# Patient Record
Sex: Female | Born: 1975 | Race: Black or African American | Hispanic: No | Marital: Married | Smoking: Never smoker
Health system: Southern US, Community
[De-identification: ages and names within clinical notes are randomized; demographics above are authoritative.]

## PROBLEM LIST (undated history)

## (undated) ENCOUNTER — Inpatient Hospital Stay (HOSPITAL_COMMUNITY): Payer: Self-pay

## (undated) DIAGNOSIS — I2699 Other pulmonary embolism without acute cor pulmonale: Secondary | ICD-10-CM

## (undated) DIAGNOSIS — R609 Edema, unspecified: Secondary | ICD-10-CM

## (undated) DIAGNOSIS — R51 Headache: Secondary | ICD-10-CM

## (undated) HISTORY — DX: Morbid (severe) obesity due to excess calories: E66.01

## (undated) HISTORY — PX: BREAST REDUCTION SURGERY: SHX8

## (undated) HISTORY — DX: Headache: R51

## (undated) HISTORY — DX: Edema, unspecified: R60.9

---

## 1998-09-11 ENCOUNTER — Emergency Department (HOSPITAL_COMMUNITY): Admission: EM | Admit: 1998-09-11 | Discharge: 1998-09-11 | Payer: Self-pay | Admitting: Emergency Medicine

## 1998-09-11 ENCOUNTER — Encounter: Payer: Self-pay | Admitting: Emergency Medicine

## 1998-09-11 ENCOUNTER — Inpatient Hospital Stay (HOSPITAL_COMMUNITY): Admission: AD | Admit: 1998-09-11 | Discharge: 1998-09-11 | Payer: Self-pay | Admitting: Obstetrics and Gynecology

## 1998-09-12 ENCOUNTER — Inpatient Hospital Stay (HOSPITAL_COMMUNITY): Admission: AD | Admit: 1998-09-12 | Discharge: 1998-09-12 | Payer: Self-pay | Admitting: Obstetrics and Gynecology

## 1998-09-15 ENCOUNTER — Inpatient Hospital Stay (HOSPITAL_COMMUNITY): Admission: AD | Admit: 1998-09-15 | Discharge: 1998-09-15 | Payer: Self-pay | Admitting: Obstetrics & Gynecology

## 2002-03-21 ENCOUNTER — Emergency Department (HOSPITAL_COMMUNITY): Admission: EM | Admit: 2002-03-21 | Discharge: 2002-03-21 | Payer: Self-pay | Admitting: *Deleted

## 2002-03-21 ENCOUNTER — Encounter: Payer: Self-pay | Admitting: Emergency Medicine

## 2003-01-20 ENCOUNTER — Observation Stay (HOSPITAL_COMMUNITY): Admission: RE | Admit: 2003-01-20 | Discharge: 2003-01-21 | Payer: Self-pay | Admitting: Plastic Surgery

## 2003-01-20 ENCOUNTER — Encounter (INDEPENDENT_AMBULATORY_CARE_PROVIDER_SITE_OTHER): Payer: Self-pay | Admitting: *Deleted

## 2003-03-19 HISTORY — PX: BREAST SURGERY: SHX581

## 2003-03-31 ENCOUNTER — Emergency Department (HOSPITAL_COMMUNITY): Admission: EM | Admit: 2003-03-31 | Discharge: 2003-03-31 | Payer: Self-pay | Admitting: Emergency Medicine

## 2004-10-25 ENCOUNTER — Ambulatory Visit: Payer: Self-pay | Admitting: Internal Medicine

## 2005-10-03 ENCOUNTER — Ambulatory Visit: Payer: Self-pay | Admitting: Internal Medicine

## 2006-03-03 ENCOUNTER — Ambulatory Visit: Payer: Self-pay | Admitting: Internal Medicine

## 2006-11-15 ENCOUNTER — Encounter: Payer: Self-pay | Admitting: Internal Medicine

## 2006-11-15 DIAGNOSIS — G43909 Migraine, unspecified, not intractable, without status migrainosus: Secondary | ICD-10-CM | POA: Insufficient documentation

## 2007-07-15 ENCOUNTER — Emergency Department (HOSPITAL_COMMUNITY): Admission: EM | Admit: 2007-07-15 | Discharge: 2007-07-15 | Payer: Self-pay | Admitting: Emergency Medicine

## 2007-07-21 ENCOUNTER — Ambulatory Visit: Payer: Self-pay | Admitting: Internal Medicine

## 2007-07-21 DIAGNOSIS — N390 Urinary tract infection, site not specified: Secondary | ICD-10-CM | POA: Insufficient documentation

## 2007-07-21 DIAGNOSIS — M545 Low back pain, unspecified: Secondary | ICD-10-CM | POA: Insufficient documentation

## 2007-07-21 DIAGNOSIS — R030 Elevated blood-pressure reading, without diagnosis of hypertension: Secondary | ICD-10-CM | POA: Insufficient documentation

## 2007-08-03 ENCOUNTER — Telehealth: Payer: Self-pay | Admitting: Internal Medicine

## 2007-08-21 ENCOUNTER — Ambulatory Visit: Payer: Self-pay | Admitting: Internal Medicine

## 2007-08-25 ENCOUNTER — Telehealth: Payer: Self-pay | Admitting: Internal Medicine

## 2007-10-14 ENCOUNTER — Ambulatory Visit: Payer: Self-pay | Admitting: Internal Medicine

## 2007-10-14 DIAGNOSIS — R609 Edema, unspecified: Secondary | ICD-10-CM | POA: Insufficient documentation

## 2007-11-13 ENCOUNTER — Ambulatory Visit: Payer: Self-pay | Admitting: Internal Medicine

## 2008-01-20 ENCOUNTER — Ambulatory Visit: Payer: Self-pay | Admitting: Internal Medicine

## 2008-02-16 ENCOUNTER — Telehealth: Payer: Self-pay | Admitting: Internal Medicine

## 2008-10-12 ENCOUNTER — Emergency Department (HOSPITAL_COMMUNITY): Admission: EM | Admit: 2008-10-12 | Discharge: 2008-10-12 | Payer: Self-pay | Admitting: Emergency Medicine

## 2008-11-11 ENCOUNTER — Inpatient Hospital Stay (HOSPITAL_COMMUNITY): Admission: AD | Admit: 2008-11-11 | Discharge: 2008-11-12 | Payer: Self-pay | Admitting: Obstetrics & Gynecology

## 2008-12-22 ENCOUNTER — Inpatient Hospital Stay (HOSPITAL_COMMUNITY): Admission: AD | Admit: 2008-12-22 | Discharge: 2008-12-22 | Payer: Self-pay | Admitting: Obstetrics and Gynecology

## 2008-12-27 ENCOUNTER — Other Ambulatory Visit: Admission: RE | Admit: 2008-12-27 | Discharge: 2008-12-27 | Payer: Self-pay | Admitting: Obstetrics and Gynecology

## 2009-01-06 ENCOUNTER — Inpatient Hospital Stay (HOSPITAL_COMMUNITY): Admission: AD | Admit: 2009-01-06 | Discharge: 2009-01-06 | Payer: Self-pay | Admitting: Obstetrics and Gynecology

## 2009-06-19 ENCOUNTER — Inpatient Hospital Stay (HOSPITAL_COMMUNITY): Admission: AD | Admit: 2009-06-19 | Discharge: 2009-06-19 | Payer: Self-pay | Admitting: Obstetrics and Gynecology

## 2009-09-20 ENCOUNTER — Ambulatory Visit: Payer: Self-pay | Admitting: Nurse Practitioner

## 2009-09-20 ENCOUNTER — Inpatient Hospital Stay (HOSPITAL_COMMUNITY): Admission: AD | Admit: 2009-09-20 | Discharge: 2009-09-20 | Payer: Self-pay | Admitting: Obstetrics and Gynecology

## 2009-10-11 ENCOUNTER — Encounter: Admission: RE | Admit: 2009-10-11 | Discharge: 2009-10-11 | Payer: Self-pay | Admitting: Obstetrics and Gynecology

## 2009-11-22 ENCOUNTER — Inpatient Hospital Stay (HOSPITAL_COMMUNITY)
Admission: RE | Admit: 2009-11-22 | Discharge: 2009-11-24 | Payer: Self-pay | Source: Home / Self Care | Admitting: Obstetrics and Gynecology

## 2010-01-17 ENCOUNTER — Ambulatory Visit: Payer: Self-pay | Admitting: Internal Medicine

## 2010-04-17 NOTE — Progress Notes (Signed)
Summary: RFs Lost  Phone Note Call from Patient Call back at Home Phone [phone]   Summary of Call: Patient is requesting rx's from last office visit, ok to fill? She said she lost rx. Initial call taken by: Lamar Sprinkles,  February 16, 2008 2:05 PM  Follow-up for Phone Call        OK to refill Follow-up by: Tresa Garter MD,  February 16, 2008 4:25 PM  Additional Follow-up for Phone Call Additional follow up Details #1::        Pt informed  Additional Follow-up by: Lamar Sprinkles,  February 17, 2008 3:14 PM      Prescriptions: SPIRONOLACTONE 25 MG TABS (SPIRONOLACTONE) 1 by mouth qd  #30 x 12   Entered by:   Lamar Sprinkles   Authorized by:   Tresa Garter MD   Signed by:   Lamar Sprinkles on 02/17/2008   Method used:   Telephoned to ...       [facility] [phone]* (retail)       [address] [place], [place]  [address]       Ph: [phone]       Fax: [phone]   RxID:   [id number] MIRALAX   POWD (POLYETHYLENE GLYCOL 3350) 17g by mouth once daily as needed constipation  #qs x 12   Entered by:   Lamar Sprinkles   Authorized by:   Tresa Garter MD   Signed by:   Lamar Sprinkles on 02/17/2008   Method used:   Telephoned to ...       [facility] [phone]* (retail)       [address] [place], [place]  [address]       Ph: [phone]       Fax: [phone]   RxID:   [id number] PHENTERMINE HCL 37.5 MG  TABS (PHENTERMINE HCL) 1 by mouth qam  #30 x 2   Entered by:   Lamar Sprinkles   Authorized by:   Tresa Garter MD   Signed by:   Lamar Sprinkles on 02/17/2008   Method used:   Telephoned to ...       [facility] [phone]* (retail)       [address] [place], [place]  [address]       Ph: [phone]       Fax: [phone]   RxID:   [id number]

## 2010-04-17 NOTE — Progress Notes (Signed)
Summary: OTC MEDS  Phone Note Call from Patient Call back at Home Phone [phone]   Summary of Call: Pt says that meds given at last office visit makes her sleepy. This is interferring with her work, is there an otc med or another rx that she can try.  Initial call taken by: Lamar Sprinkles,  Aug 03, 2007 8:31 AM  Follow-up for Phone Call        Cont Meloxicam as needed Try ES Tylenol 2 by mouth qid as needed, d/c Darvocet Follow-up by: Tresa Garter MD,  Aug 03, 2007 1:18 PM  Additional Follow-up for Phone Call Additional follow up Details #1::        lf mess for pt Additional Follow-up by: Lamar Sprinkles,  Aug 03, 2007 1:52 PM

## 2010-04-17 NOTE — Assessment & Plan Note (Signed)
Summary: f/u appt / $50 / cd   Vital Signs:  Patient Profile:   35 Years Old Female Weight:      315 pounds Temp:     98.6 degrees F oral Pulse rate:   84 / minute BP sitting:   142 / 90  (left arm)  Vitals Entered By: Tora Perches (January 20, 2008 4:42 PM)                 Chief Complaint:  Multiple medical problems or concerns.  History of Present Illness: The patient presents for a follow up of obesity, edema,      Current Allergies: No known allergies   Past Medical History:    Reviewed history from 07/21/2007 and no changes required:       Headache       Morbid obesity       Constipation   Family History:    Reviewed history from 07/21/2007 and no changes required:       Family History Hypertension  Social History:    Reviewed history from 07/21/2007 and no changes required:       Occupation:works for [employer]       Single       Never Smoked     Physical Exam  General:     overweight-appearing.   Head:     Normocephalic and atraumatic without obvious abnormalities. No apparent alopecia or balding. Nose:     External nasal examination shows no deformity or inflammation. Nasal mucosa are pink and moist without lesions or exudates. Mouth:     Oral mucosa and oropharynx without lesions or exudates.  Teeth in good repair. Neck:     No deformities, masses, or tenderness noted. Lungs:     Normal respiratory effort, chest expands symmetrically. Lungs are clear to auscultation, no crackles or wheezes. Heart:     Normal rate and regular rhythm. S1 and S2 normal without gallop, murmur, click, rub or other extra sounds. Abdomen:     Bowel sounds positive,abdomen soft and non-tender without masses, organomegaly or hernias noted. Msk:     No deformity or scoliosis noted of thoracic or lumbar spine.  Calves NT B Extremities:     WNL Neurologic:     No cranial nerve deficits noted. Station and gait are normal. Plantar reflexes are down-going bilaterally.  DTRs are symmetrical throughout. Sensory, motor and coordinative functions appear intact. Skin:     Intact without suspicious lesions or rashes Psych:     Oriented X3.      Impression & Recommendations:  Problem # 1:  OBESITY, MORBID (ICD-278.01) Assessment: Unchanged Another trial of Phentermine Risks vs benefits and controversies of a long term wt loss drug use were discussed.   Problem # 2:  EDEMA (ICD-782.3) Assessment: Improved  Her updated medication list for this problem includes:    Spironolactone 25 Mg Tabs (Spironolactone) .Marland Kitchen... 1 by mouth qd   Problem # 3:  ELEVATED BP (ICD-796.2) Assessment: Comment Only  Her updated medication list for this problem includes:    Spironolactone 25 Mg Tabs (Spironolactone) .Marland Kitchen... 1 by mouth qd   Complete Medication List: 1)  Phentermine Hcl 37.5 Mg Tabs (Phentermine hcl) .Marland Kitchen.. 1 by mouth qam 2)  Miralax Powd (Polyethylene glycol 3350) .Marland Kitchen.. 17g by mouth once daily as needed constipation 3)  Spironolactone 25 Mg Tabs (Spironolactone) .Marland Kitchen.. 1 by mouth qd 4)  Vitamin D3 1000 Unit Tabs (Cholecalciferol) .Marland Kitchen.. 1 by mouth daily   Patient Instructions:  1)  Please schedule a follow-up appointment in 3 months. 2)  BMP prior to visit, ICD-9: 3)  Hepatic Panel prior to visit, ICD-9: 4)  TSH prior to visit, ICD-9: 278.00 5)  CBC w/ Diff prior to visit, ICD-9:   Prescriptions: SPIRONOLACTONE 25 MG TABS (SPIRONOLACTONE) 1 by mouth qd  #30 x 12   Entered and Authorized by:   Tresa Garter MD   Signed by:   Tresa Garter MD on 01/20/2008   Method used:   Print then Give to Patient   RxID:   [id number] MIRALAX   POWD (POLYETHYLENE GLYCOL 3350) 17g by mouth once daily as needed constipation  #qs x 12   Entered and Authorized by:   Tresa Garter MD   Signed by:   Tresa Garter MD on 01/20/2008   Method used:   Print then Give to Patient   RxID:   [id number] PHENTERMINE HCL 37.5 MG  TABS (PHENTERMINE HCL) 1  by mouth qam  #30 x 2   Entered and Authorized by:   Tresa Garter MD   Signed by:   Tresa Garter MD on 01/20/2008   Method used:   Print then Give to Patient   RxID:   [id number]  ]  Influenza Vaccine (to be given today)

## 2010-04-17 NOTE — Letter (Signed)
Summary: Out of Work  [facility]  [address] [place], [place] [address]   Phone: [phone]  Fax: [phone]    October 14, 2007   Employee:  ADONICA FUKUSHIMA    To Whom It May Concern:   For Medical reasons, please excuse the above named employee from work for the following dates:  Start:  10-14-2007      End:   10-14-2007  If you need additional information, please feel free to contact our office.         Sincerely,    Tora Perches

## 2010-04-17 NOTE — Assessment & Plan Note (Signed)
Summary: SWELLING IN LEG/NWS   Vital Signs:  Patient profile:   35 year old female Weight:      328 pounds Temp:     98.3 degrees F oral Pulse rate:   72 / minute Pulse rhythm:   regular Resp:     16 per minute BP sitting:   130 / 98  (left arm) Cuff size:   regular  Vitals Entered By: Lanier Prude, CMA(AAMA) (January 17, 2010 4:53 PM) CC: fell in July. c/o left leg/foot pain & swelling Is Patient Diabetic? No Comments pt also wants to discuss weight loss   CC:  fell in July. c/o left leg/foot pain & swelling.  History of Present Illness: C/o swelling in L foot. She had a baby in Sept 2011. She fell off the steps in Jul 2011 and twisted her L foot/ankle. She had a Doppler US in Jul - no DVT. There was no xrays done. The foot is NT now. C/o wt gain. Not breast-feeding now  Current Medications (verified): 1)  Phentermine Hcl 37.5 Mg  Tabs (Phentermine Hcl) .Marland Kitchen.. 1 By Mouth Qam 2)  Miralax   Powd (Polyethylene Glycol 3350) .Marland Kitchen.. 17g By Mouth Once Daily As Needed Constipation 3)  Spironolactone 25 Mg Tabs (Spironolactone) .Marland Kitchen.. 1 By Mouth Qd 4)  Vitamin D3 1000 Unit  Tabs (Cholecalciferol) .Marland Kitchen.. 1 By Mouth Daily  Allergies (verified): No Known Drug Allergies  Past History:  Past Medical History: Last updated: 01/20/2008 Headache Morbid obesity Constipation  Family History: Last updated: 07/21/2007 Family History Hypertension  Past Surgical History: Denies surgical history  Social History: Occupation:works for [employer] Single 1 child 11/2009 Never Smoked  Review of Systems       The patient complains of weight gain and difficulty walking.  The patient denies anorexia, fever, weight loss, vision loss, decreased hearing, hoarseness, chest pain, syncope, dyspnea on exertion, peripheral edema, prolonged cough, headaches, hemoptysis, abdominal pain, melena, hematochezia, severe indigestion/heartburn, hematuria, incontinence, genital sores, muscle weakness, suspicious skin  lesions, transient blindness, depression, unusual weight change, abnormal bleeding, enlarged lymph nodes, angioedema, and breast masses.    Physical Exam  General:  Very overweight-appearing.   Head:  Normocephalic and atraumatic without obvious abnormalities. No apparent alopecia or balding. Eyes:  No corneal or conjunctival inflammation noted. EOMI. Perrla. Funduscopic exam benign, without hemorrhages, exudates or papilledema. Vision grossly normal. Ears:  External ear exam shows no significant lesions or deformities.  Otoscopic examination reveals clear canals, tympanic membranes are intact bilaterally without bulging, retraction, inflammation or discharge. Hearing is grossly normal bilaterally. Nose:  External nasal examination shows no deformity or inflammation. Nasal mucosa are pink and moist without lesions or exudates. Mouth:  Oral mucosa and oropharynx without lesions or exudates.  Teeth in good repair. Neck:  No deformities, masses, or tenderness noted. Lungs:  Normal respiratory effort, chest expands symmetrically. Lungs are clear to auscultation, no crackles or wheezes. Heart:  Normal rate and regular rhythm. S1 and S2 normal without gallop, murmur, click, rub or other extra sounds. Abdomen:  Bowel sounds positive,abdomen soft and non-tender without masses, organomegaly or hernias noted. Msk:  No deformity or scoliosis noted of thoracic or lumbar spine.  Calves NT B. L foot with a very large dorsal soft NT fluid collection subcutaneously spreading up to distal lat ankle. Portable US c/w large free fluid collection, no blood flow Pulses:  decreased Extremities:  trace left pedal edema and trace right pedal edema.   Neurologic:  alert & oriented X3, gait  normal, and DTRs symmetrical and normal.   Skin:  no rash Psych:  Oriented X3.     Impression & Recommendations:  Problem # 1:  EDEMA (ICD-782.3) L foot - localized. Assessment New See "Patient Instructions". May need an MRI.  Portable US was done The following medications were removed from the medication list:    Spironolactone 25 Mg Tabs (Spironolactone) .Marland Kitchen... 1 by mouth qd Her updated medication list for this problem includes:    Maxzide-25 37.5-25 Mg Tabs (Triamterene-hctz) .Marland Kitchen... 1 by mouth qam  Orders: T-Foot Left Min 3 Views (73630TC) T-Ankle Comp Left Min 3 Views (73610TC) TLB-TSH (Thyroid Stimulating Hormone) (84443-TSH) T-Vitamin D (25-Hydroxy) [phone]) TLB-B12, Serum-Total ONLY (09811-B14) TLB-BMP (Basic Metabolic Panel-BMET) (80048-METABOL) TLB-CBC Platelet - w/Differential (85025-CBCD) TLB-Hepatic/Liver Function Pnl (80076-HEPATIC)  Problem # 2:  OBESITY, MORBID (ICD-278.01) Assessment: Deteriorated Will restart Phentermine Risks vs benefits and controversies of a long term Phentermine use were discussed.  Orders: TLB-TSH (Thyroid Stimulating Hormone) (84443-TSH) T-Vitamin D (25-Hydroxy) [phone]) TLB-B12, Serum-Total ONLY (86578-I69) TLB-BMP (Basic Metabolic Panel-BMET) (80048-METABOL) TLB-CBC Platelet - w/Differential (85025-CBCD) TLB-Hepatic/Liver Function Pnl (80076-HEPATIC)  Problem # 3:  ELEVATED BP (ICD-796.2) Assessment: New  The following medications were removed from the medication list:    Spironolactone 25 Mg Tabs (Spironolactone) .Marland Kitchen... 1 by mouth qd Her updated medication list for this problem includes:    Maxzide-25 37.5-25 Mg Tabs (Triamterene-hctz) .Marland Kitchen... 1 by mouth qam  Complete Medication List: 1)  Miralax Powd (Polyethylene glycol 3350) .Marland Kitchen.. 17g by mouth once daily as needed constipation 2)  Vitamin D3 1000 Unit Tabs (Cholecalciferol) .Marland Kitchen.. 1 by mouth daily 3)  Maxzide-25 37.5-25 Mg Tabs (Triamterene-hctz) .Marland Kitchen.. 1 by mouth qam 4)  Phentermine Hcl 37.5 Mg Tabs (Phentermine hcl) .Marland Kitchen.. 1 by mouth qam 5)  Ibuprofen 600 Mg Tabs (Ibuprofen) .Marland Kitchen.. 1 by mouth bid  pc x 1 wk then as needed for  pain 6)  Klor-con M10 10 Meq Cr-tabs (Potassium chloride crys cr) .Marland Kitchen.. 1 by  mouth qd  Patient Instructions: 1)  Please schedule a follow-up appointment in 1 month. 2)  Elevate foot 3)  Use ACE wrap Prescriptions: MAXZIDE-25 37.5-25 MG TABS (TRIAMTERENE-HCTZ) 1 by mouth qam  #30 x 6   Entered and Authorized by:   Tresa Garter MD   Signed by:   Tresa Garter MD on 01/17/2010   Method used:   Print then Give to Patient   RxID:   [id number] KLOR-CON M10 10 MEQ CR-TABS (POTASSIUM CHLORIDE CRYS CR) 1 by mouth qd  #90 x 3   Entered and Authorized by:   Tresa Garter MD   Signed by:   Tresa Garter MD on 01/17/2010   Method used:   Print then Give to Patient   RxID:   [id number] IBUPROFEN 600 MG TABS (IBUPROFEN) 1 by mouth bid  pc x 1 wk then as needed for  pain  #60 x 3   Entered and Authorized by:   Tresa Garter MD   Signed by:   Tresa Garter MD on 01/17/2010   Method used:   Print then Give to Patient   RxID:   [id number] PHENTERMINE HCL 37.5 MG TABS (PHENTERMINE HCL) 1 by mouth qam  #30 x 2   Entered and Authorized by:   Tresa Garter MD   Signed by:   Tresa Garter MD on 01/17/2010   Method used:   Print then Give to Patient   RxID:   [id number]  Orders Added: 1)  T-Foot Left Min 3 Views [73630TC] 2)  T-Ankle Comp Left Min 3 Views [73610TC] 3)  TLB-TSH (Thyroid Stimulating Hormone) [84443-TSH] 4)  T-Vitamin D (25-Hydroxy) [16109-60454] 5)  TLB-B12, Serum-Total ONLY [82607-B12] 6)  TLB-BMP (Basic Metabolic Panel-BMET) [80048-METABOL] 7)  TLB-CBC Platelet - w/Differential [85025-CBCD] 8)  TLB-Hepatic/Liver Function Pnl [80076-HEPATIC] 9)  Est. Patient Level V [09811]

## 2010-04-17 NOTE — Assessment & Plan Note (Signed)
Summary: 1 MTH FU-$50-STC   Vital Signs:  Patient Profile:   35 Years Old Female Weight:      331 pounds Temp:     97.4 degrees F oral Pulse rate:   80 / minute BP sitting:   149 / 98  (left arm)  Vitals Entered By: Tora Perches (August 21, 2007 4:35 PM)                 Chief Complaint:  Multiple medical problems or concerns.  History of Present Illness: The patient presents for a follow up ofL BP 25% better     Current Allergies: No known allergies   Past Medical History:    Reviewed history from 07/21/2007 and no changes required:       Headache       Morbid obesity   Family History:    Reviewed history from 07/21/2007 and no changes required:       Family History Hypertension  Social History:    Reviewed history from 07/21/2007 and no changes required:       Occupation:works for [employer]       Single       Never Smoked     Physical Exam  General:     overweight-appearing.   Eyes:     No corneal or conjunctival inflammation noted. EOMI. Perrla. Funduscopic exam benign, without hemorrhages, exudates or papilledema. Vision grossly normal. Ears:     External ear exam shows no significant lesions or deformities.  Otoscopic examination reveals clear canals, tympanic membranes are intact bilaterally without bulging, retraction, inflammation or discharge. Hearing is grossly normal bilaterally. Nose:     External nasal examination shows no deformity or inflammation. Nasal mucosa are pink and moist without lesions or exudates. Mouth:     Oral mucosa and oropharynx without lesions or exudates.  Teeth in good repair. Neck:     No deformities, masses, or tenderness noted. Lungs:     Normal respiratory effort, chest expands symmetrically. Lungs are clear to auscultation, no crackles or wheezes. Heart:     Normal rate and regular rhythm. S1 and S2 normal without gallop, murmur, click, rub or other extra sounds. Abdomen:     Bowel sounds positive,abdomen soft and  non-tender without masses, organomegaly or hernias noted. Msk:     No deformity or scoliosis noted of thoracic or lumbar spine.   Neurologic:     No cranial nerve deficits noted. Station and gait are normal. Plantar reflexes are down-going bilaterally. DTRs are symmetrical throughout. Sensory, motor and coordinative functions appear intact. Skin:     Intact without suspicious lesions or rashes Psych:     Cognition and judgment appear intact. Alert and cooperative with normal attention span and concentration. No apparent delusions, illusions, hallucinations    Impression & Recommendations:  Problem # 1:  ELEVATED BP (ICD-796.2) Resolved  Problem # 2:  OBESITY, MORBID (ICD-278.01) Trial of Phentermine. Risks vs benefits and controversies of a long term wt loss drugs use were discussed.   Complete Medication List: 1)  Meloxicam 15 Mg Tabs (Meloxicam) .... One by mouth daily 1 wk, then prn 2)  Vitamin D3 1000 Unit Tabs (Cholecalciferol) .Marland Kitchen.. 1 by mouth daily 3)  Phentermine Hcl 37.5 Mg Tabs (Phentermine hcl) .Marland Kitchen.. 1 by mouth qam 4)  Miralax Powd (Polyethylene glycol 3350) .Marland Kitchen.. 17g by mouth once daily as needed constipation   Patient Instructions: 1)  Please schedule a follow-up appointment in 3 months.   Prescriptions: MIRALAX  POWD (POLYETHYLENE GLYCOL 3350) 17g by mouth once daily as needed constipation  #qs x 3   Entered and Authorized by:   Tresa Garter MD   Signed by:   Tresa Garter MD on 08/21/2007   Method used:   Print then Give to Patient   RxID:   [id number] PHENTERMINE HCL 37.5 MG  TABS (PHENTERMINE HCL) 1 by mouth qam  #30 x 3   Entered and Authorized by:   Tresa Garter MD   Signed by:   Tresa Garter MD on 08/21/2007   Method used:   Print then Give to Patient   RxID:   [id number]  ]

## 2010-05-03 ENCOUNTER — Other Ambulatory Visit (HOSPITAL_COMMUNITY)
Admission: RE | Admit: 2010-05-03 | Discharge: 2010-05-03 | Disposition: A | Discharge status: Home / Self Care | Payer: 59 | Source: Ambulatory Visit | Attending: Obstetrics and Gynecology | Admitting: Obstetrics and Gynecology

## 2010-05-03 ENCOUNTER — Other Ambulatory Visit: Payer: Self-pay | Admitting: Obstetrics and Gynecology

## 2010-05-03 DIAGNOSIS — Z01419 Encounter for gynecological examination (general) (routine) without abnormal findings: Secondary | ICD-10-CM | POA: Insufficient documentation

## 2010-05-03 DIAGNOSIS — Z113 Encounter for screening for infections with a predominantly sexual mode of transmission: Secondary | ICD-10-CM | POA: Insufficient documentation

## 2010-05-28 ENCOUNTER — Inpatient Hospital Stay (HOSPITAL_COMMUNITY): Payer: 59

## 2010-05-28 ENCOUNTER — Inpatient Hospital Stay (HOSPITAL_COMMUNITY)
Admission: AD | Admit: 2010-05-28 | Discharge: 2010-05-28 | Disposition: A | Discharge status: Home / Self Care | Payer: 59 | Source: Ambulatory Visit | Attending: Obstetrics | Admitting: Obstetrics

## 2010-05-28 DIAGNOSIS — N949 Unspecified condition associated with female genital organs and menstrual cycle: Secondary | ICD-10-CM | POA: Insufficient documentation

## 2010-05-28 DIAGNOSIS — N938 Other specified abnormal uterine and vaginal bleeding: Secondary | ICD-10-CM | POA: Insufficient documentation

## 2010-05-31 LAB — COMPREHENSIVE METABOLIC PANEL
ALT: 27 U/L (ref 0–35)
AST: 36 U/L (ref 0–37)
Albumin: 3 g/dL — ABNORMAL LOW (ref 3.5–5.2)
Alkaline Phosphatase: 84 U/L (ref 39–117)
BUN: 7 mg/dL (ref 6–23)
CO2: 21 mEq/L (ref 19–32)
Calcium: 9.2 mg/dL (ref 8.4–10.5)
Chloride: 109 mEq/L (ref 96–112)
Creatinine, Ser: 0.55 mg/dL (ref 0.4–1.2)
GFR calc Af Amer: >60 mL/min (ref >60)
GFR calc non Af Amer: >60 mL/min (ref >60)
Glucose, Bld: 84 mg/dL (ref 70–99)
Potassium: 4.2 mEq/L (ref 3.5–5.1)
Sodium: 138 mEq/L (ref 135–145)
Total Bilirubin: 1.1 mg/dL (ref 0.3–1.2)
Total Protein: 5.9 g/dL — ABNORMAL LOW (ref 6.0–8.3)

## 2010-05-31 LAB — RPR: RPR Ser Ql: NONREACTIVE

## 2010-05-31 LAB — CBC
HCT: 30.5 % — ABNORMAL LOW (ref 36.0–46.0)
HCT: 39.8 % (ref 36.0–46.0)
Hemoglobin: 10.8 g/dL — ABNORMAL LOW (ref 12.0–15.0)
Hemoglobin: 13.6 g/dL (ref 12.0–15.0)
MCH: 33.9 pg (ref 26.0–34.0)
MCH: 35.3 pg — ABNORMAL HIGH (ref 26.0–34.0)
MCHC: 34.1 g/dL (ref 30.0–36.0)
MCHC: 35.4 g/dL (ref 30.0–36.0)
MCV: 99.3 fL (ref 78.0–100.0)
MCV: 99.5 fL (ref 78.0–100.0)
Platelets: 207 10*3/uL (ref 150–400)
Platelets: 279 10*3/uL (ref 150–400)
RBC: 3.06 MIL/uL — ABNORMAL LOW (ref 3.87–5.11)
RBC: 4.01 MIL/uL (ref 3.87–5.11)
RDW: 13.4 % (ref 11.5–15.5)
RDW: 13.6 % (ref 11.5–15.5)
WBC: 20.4 10*3/uL — ABNORMAL HIGH (ref 4.0–10.5)
WBC: 8.5 10*3/uL (ref 4.0–10.5)

## 2010-05-31 LAB — URIC ACID: Uric Acid, Serum: 4.4 mg/dL (ref 2.4–7.0)

## 2010-05-31 LAB — LACTATE DEHYDROGENASE: LDH: 161 U/L (ref 94–250)

## 2010-06-03 LAB — URINALYSIS, ROUTINE W REFLEX MICROSCOPIC
Bilirubin Urine: NEGATIVE
Glucose, UA: NEGATIVE mg/dL
Ketones, ur: NEGATIVE mg/dL
Leukocytes, UA: NEGATIVE
Nitrite: NEGATIVE
Protein, ur: NEGATIVE mg/dL
Specific Gravity, Urine: >1.03 — ABNORMAL HIGH (ref 1.005–1.030)
Urobilinogen, UA: 1 mg/dL (ref 0.0–1.0)
pH: 6 (ref 5.0–8.0)

## 2010-06-03 LAB — CBC
HCT: 34.1 % — ABNORMAL LOW (ref 36.0–46.0)
Hemoglobin: 11.7 g/dL — ABNORMAL LOW (ref 12.0–15.0)
MCH: 33.7 pg (ref 26.0–34.0)
MCHC: 34.2 g/dL (ref 30.0–36.0)
MCV: 98.3 fL (ref 78.0–100.0)
Platelets: 244 10*3/uL (ref 150–400)
RBC: 3.47 MIL/uL — ABNORMAL LOW (ref 3.87–5.11)
RDW: 12.8 % (ref 11.5–15.5)
WBC: 9.7 10*3/uL (ref 4.0–10.5)

## 2010-06-03 LAB — URINE CULTURE

## 2010-06-03 LAB — COMPREHENSIVE METABOLIC PANEL
ALT: 24 U/L (ref 0–35)
AST: 27 U/L (ref 0–37)
Albumin: 2.9 g/dL — ABNORMAL LOW (ref 3.5–5.2)
Alkaline Phosphatase: 40 U/L (ref 39–117)
BUN: 6 mg/dL (ref 6–23)
CO2: 22 mEq/L (ref 19–32)
Calcium: 9.1 mg/dL (ref 8.4–10.5)
Chloride: 102 mEq/L (ref 96–112)
Creatinine, Ser: 0.57 mg/dL (ref 0.4–1.2)
GFR calc Af Amer: >60 mL/min (ref >60)
GFR calc non Af Amer: >60 mL/min (ref >60)
Glucose, Bld: 90 mg/dL (ref 70–99)
Potassium: 3.5 mEq/L (ref 3.5–5.1)
Sodium: 130 mEq/L — ABNORMAL LOW (ref 135–145)
Total Bilirubin: 0.8 mg/dL (ref 0.3–1.2)
Total Protein: 6.1 g/dL (ref 6.0–8.3)

## 2010-06-03 LAB — DIFFERENTIAL
Basophils Absolute: 0 10*3/uL (ref 0.0–0.1)
Basophils Relative: 0 % (ref 0–1)
Eosinophils Absolute: 0.1 10*3/uL (ref 0.0–0.7)
Eosinophils Relative: 1 % (ref 0–5)
Lymphocytes Relative: 24 % (ref 12–46)
Lymphs Abs: 2.3 10*3/uL (ref 0.7–4.0)
Monocytes Absolute: 0.6 10*3/uL (ref 0.1–1.0)
Monocytes Relative: 7 % (ref 3–12)
Neutro Abs: 6.6 10*3/uL (ref 1.7–7.7)
Neutrophils Relative %: 68 % (ref 43–77)

## 2010-06-03 LAB — URINE MICROSCOPIC-ADD ON

## 2010-06-06 LAB — URINALYSIS, ROUTINE W REFLEX MICROSCOPIC
Glucose, UA: NEGATIVE mg/dL
Ketones, ur: NEGATIVE mg/dL
Leukocytes, UA: NEGATIVE
Nitrite: NEGATIVE
Protein, ur: 30 mg/dL — AB
Specific Gravity, Urine: >1.03 — ABNORMAL HIGH (ref 1.005–1.030)
Urobilinogen, UA: 1 mg/dL (ref 0.0–1.0)
pH: 6.5 (ref 5.0–8.0)

## 2010-06-06 LAB — URINE MICROSCOPIC-ADD ON

## 2010-06-21 LAB — CBC
HCT: 38.8 % (ref 36.0–46.0)
Hemoglobin: 13 g/dL (ref 12.0–15.0)
MCHC: 33.5 g/dL (ref 30.0–36.0)
MCV: 98.4 fL (ref 78.0–100.0)
Platelets: 282 10*3/uL (ref 150–400)
RBC: 3.94 MIL/uL (ref 3.87–5.11)
RDW: 13.1 % (ref 11.5–15.5)
WBC: 9.5 10*3/uL (ref 4.0–10.5)

## 2010-06-21 LAB — WET PREP, GENITAL
Clue Cells Wet Prep HPF POC: NONE SEEN
Yeast Wet Prep HPF POC: NONE SEEN

## 2010-06-21 LAB — CROSSMATCH
ABO/RH(D): A POS
Antibody Screen: NEGATIVE

## 2010-06-21 LAB — URINALYSIS, ROUTINE W REFLEX MICROSCOPIC
Bilirubin Urine: NEGATIVE
Glucose, UA: NEGATIVE mg/dL
Hgb urine dipstick: NEGATIVE
Ketones, ur: NEGATIVE mg/dL
Nitrite: NEGATIVE
Protein, ur: NEGATIVE mg/dL
Specific Gravity, Urine: 1.025 (ref 1.005–1.030)
Urobilinogen, UA: 0.2 mg/dL (ref 0.0–1.0)
pH: 6 (ref 5.0–8.0)

## 2010-06-21 LAB — ABO/RH: ABO/RH(D): A POS

## 2010-06-21 LAB — HCG, QUANTITATIVE, PREGNANCY: hCG, Beta Chain, Quant, S: 3400 m[IU]/mL — ABNORMAL HIGH (ref <5)

## 2010-06-21 LAB — GC/CHLAMYDIA PROBE AMP, GENITAL
Chlamydia, DNA Probe: NEGATIVE
GC Probe Amp, Genital: NEGATIVE

## 2010-06-23 LAB — GC/CHLAMYDIA PROBE AMP, GENITAL
Chlamydia, DNA Probe: NEGATIVE
GC Probe Amp, Genital: NEGATIVE

## 2010-06-23 LAB — CBC
HCT: 37.4 % (ref 36.0–46.0)
Hemoglobin: 12.5 g/dL (ref 12.0–15.0)
MCHC: 33.5 g/dL (ref 30.0–36.0)
MCV: 99.4 fL (ref 78.0–100.0)
Platelets: 283 10*3/uL (ref 150–400)
RBC: 3.76 MIL/uL — ABNORMAL LOW (ref 3.87–5.11)
RDW: 12.9 % (ref 11.5–15.5)
WBC: 10.3 10*3/uL (ref 4.0–10.5)

## 2010-06-23 LAB — HCG, SERUM, QUALITATIVE: Preg, Serum: POSITIVE — AB

## 2010-06-23 LAB — URINE MICROSCOPIC-ADD ON

## 2010-06-23 LAB — WET PREP, GENITAL
Clue Cells Wet Prep HPF POC: NONE SEEN
Trich, Wet Prep: NONE SEEN
Yeast Wet Prep HPF POC: NONE SEEN

## 2010-06-23 LAB — URINALYSIS, ROUTINE W REFLEX MICROSCOPIC
Bilirubin Urine: NEGATIVE
Glucose, UA: NEGATIVE mg/dL
Ketones, ur: 15 mg/dL — AB
Leukocytes, UA: NEGATIVE
Nitrite: NEGATIVE
Protein, ur: NEGATIVE mg/dL
Specific Gravity, Urine: >1.03 — ABNORMAL HIGH (ref 1.005–1.030)
Urobilinogen, UA: 0.2 mg/dL (ref 0.0–1.0)
pH: 6 (ref 5.0–8.0)

## 2010-06-23 LAB — POCT PREGNANCY, URINE: Preg Test, Ur: POSITIVE

## 2010-06-23 LAB — HCG, QUANTITATIVE, PREGNANCY: hCG, Beta Chain, Quant, S: 9205 m[IU]/mL — ABNORMAL HIGH (ref <5)

## 2010-06-24 LAB — DIFFERENTIAL
Basophils Absolute: 0 10*3/uL (ref 0.0–0.1)
Basophils Relative: 1 % (ref 0–1)
Eosinophils Absolute: 0.1 10*3/uL (ref 0.0–0.7)
Eosinophils Relative: 1 % (ref 0–5)
Lymphocytes Relative: 24 % (ref 12–46)
Lymphs Abs: 1.7 10*3/uL (ref 0.7–4.0)
Monocytes Absolute: 0.4 10*3/uL (ref 0.1–1.0)
Monocytes Relative: 6 % (ref 3–12)
Neutro Abs: 4.7 10*3/uL (ref 1.7–7.7)
Neutrophils Relative %: 68 % (ref 43–77)

## 2010-06-24 LAB — BASIC METABOLIC PANEL
BUN: 10 mg/dL (ref 6–23)
CO2: 28 mEq/L (ref 19–32)
Calcium: 9.1 mg/dL (ref 8.4–10.5)
Chloride: 108 mEq/L (ref 96–112)
Creatinine, Ser: 0.58 mg/dL (ref 0.4–1.2)
GFR calc Af Amer: >60 mL/min (ref >60)
GFR calc non Af Amer: >60 mL/min (ref >60)
Glucose, Bld: 120 mg/dL — ABNORMAL HIGH (ref 70–99)
Potassium: 3.5 mEq/L (ref 3.5–5.1)
Sodium: 140 mEq/L (ref 135–145)

## 2010-06-24 LAB — CBC
HCT: 38.6 % (ref 36.0–46.0)
Hemoglobin: 13.1 g/dL (ref 12.0–15.0)
MCHC: 33.9 g/dL (ref 30.0–36.0)
MCV: 96.1 fL (ref 78.0–100.0)
Platelets: 332 10*3/uL (ref 150–400)
RBC: 4.02 MIL/uL (ref 3.87–5.11)
RDW: 12.7 % (ref 11.5–15.5)
WBC: 6.9 10*3/uL (ref 4.0–10.5)

## 2010-06-24 LAB — POCT CARDIAC MARKERS
CKMB, poc: <1 ng/mL — ABNORMAL LOW (ref 1.0–8.0)
Myoglobin, poc: 59.6 ng/mL (ref 12–200)
Troponin i, poc: <0.05 ng/mL (ref 0.00–0.09)

## 2010-08-03 NOTE — Discharge Summary (Signed)
NAME:  Bridget [name], Bridget [name]                        ACCOUNT NO.:  [id number]   MEDICAL RECORD NO.:  [id number]                   PATIENT TYPE:  OBV   LOCATION:  0480                                 FACILITY:  [facility]   PHYSICIAN:  Brantley Persons, M.D.             DATE OF BIRTH:  11-09-1975   DATE OF ADMISSION:  01/20/2003  DATE OF DISCHARGE:  01/21/2003                                 DISCHARGE SUMMARY   PREOPERATIVE DIAGNOSIS:  Bilateral macromastia.   POSTOPERATIVE DIAGNOSIS:  Bilateral macromastia.   PROCEDURE:  Bilateral reduction mammoplasty.   ATTENDING SURGEON:  Brantley Persons, M.D.   ASSISTANT:  Raynald Kemp, M.D.   ANESTHESIA:  General, endotracheal.   ANESTHESIOLOGIST:  Jill Side, M.D.   ESTIMATED BLOOD LOSS:  225 mL.   FLUIDS REPLACED:  3600 mL Crystalloid.   URINE OUTPUT:  200 mL.   COMPLICATIONS:  None.   INDICATIONS FOR PROCEDURE:  The patient is a 35 year old African-American  female who presents with bilateral macromastia that is claimed to be  symptomatic.  The patient now presents to undergo bilateral reduction  mammoplasties.   PROCEDURE:  The patient was marked in the preop holding area in a pattern of  Wise for the future bilateral reduction mammoplasties.  She was then taken  back to the OR and placed on the table in the supine position.  After  adequate general endotracheal anesthesia was obtained, the patient's chest  was prepped with Betadine and draped in sterile fashion.  The base of the  breasts were then injected with 1% lidocaine with epinephrine.  After  adequate hemostasis and anesthesia had taken effect, the procedure was  begun.   First, the right  breast reduction was performed.  The nipple was marked  with a 45-mm nipple marker.  This was then incised and skin deepithelialized  around the nipple, down to the inframammary crease in inferior pedicle  pattern.  Next, the medial, superior and lateral skin flaps were  elevated  down to the chest wall.  The excess fat and glandular tissue removed from  the inferior pedicle.  The nipple was then examined and found to be pink and  viable.  The wound was then irrigated with saline irrigation.  Meticulous  hemostasis was obtained with Bovie electrocautery.  The inferior pedicle was  then centralized with 3-0 Prolene suture.  A #10 Blake flat fluted drain was  then place into the wound.  The skin flaps were brought together in the  inverted T junction with a 2-0 Prolene suture.  The incisions were then  stapled for temporary closure.   Next, the left breast reduction was performed.  The nipple was marked with a  45-mm nipple marker.  The skin was then incised around this and  deepithelialized down to the inframammary crease in inferior pedicle  pattern.  Next, the medial, superior and lateral skin flaps were elevated  down to the  chest wall.  The excess fat and glandular tissue removed from  the inferior pedicle.  The nipple was then examined and found to be pink and  viable.  The wound was then irrigated with saline irrigation.  Meticulous  hemostasis was obtained with Bovie electrocautery.  The inferior pedicle was  centralized using 3-0 Prolene suture.  A #10 Blake flat fluted drain was  then placed into the wound.  The skin flaps were brought together in the  inverted T junction with a 2-0 Prolene suture.  The incisions were then  stapled for temporary closure.   The breasts were then compared and had good shape and symmetry.  The staples  were then removed and the incisions closed using 3-0 Monocryl in the dermal  layer followed by 4-0 Monocryl in the intracuticular stitch on the skin.  The patient was then placed in the upright position.  The nipples were  marked on the breast mounds for the future nipple areolar complexes using  the 42-mm nipple marker.  The patient was then placed back into recumbent  position.   First, the right future nipple  areolar complex was incised.  This tissue was  excised full-thickness.  The nipple was then examined and found to be pink  and viable and brought out into this aperture and sewn in place using 4-0  Monocryl in the dermal layer followed by 5-0 Monocryl in the intracuticular  stitch on the skin.  In a likewise fashion, the future nipple areolar  complex was incised on the left breast mound.  This tissue was excised full-  thickness.  The nipple was then examined and found to be pink and viable and  brought out into this aperture and sewn in place using 4-0 Monocryl in the  dermal layer followed by 5-0 Monocryl in the intracuticular stitch on the  skin. The JP drains were sewn in place using 3-0 Nylon suture.  The  incisions were dressed with Benzoin and Steri-Strips and the nipple dressed  with Bacitracin ointment and Adaptic.  4 x 4's were then placed over the  incisions and the patient was placed in a light postoperative support bra.  There were no complications.  The patient tolerated the procedure well.  The  final needle and sponge counts were reported to be correct at the end of the  case.  The patient was then awakened from the general anesthesia and taken  to the recovery room in stable condition.  She will remain overnight as an  observation stay with discharge plan for the morning.   FINAL AMOUNTS OF BREAST TISSUE REMOVED:  Right breast 1643.4 g,  left breast  1623.9 g.                                               Brantley Persons, M.D.    MC/MEDQ  D:  01/20/2003  T:  01/21/2003  Job:  [id number]   cc:   Raynald Kemp, M.D.  [address]  [place]  [place] [address]  Fax: [phone]

## 2010-08-03 NOTE — Assessment & Plan Note (Signed)
[facility]                             PRIMARY CARE OFFICE NOTE   Bridget [name], Bridget [name]                     MRN:          [id number]  DATE:10/03/2005                            DOB:          Dec 10, 1975    The patient is a 35 year old female who presents for a wellness examination.  Past medical history, family history, social history, as per October 25, 2004  notes.   CURRENT MEDICATIONS:  None.   ALLERGIES:  NONE.   REVIEW OF SYSTEMS:  She lost weight on phentermine and then gained it back.  She has been under a lot of stress.  Took care of her grandmother which  subsequently died.  Denies chest pain, leg swelling. Has been constipated,  going to the bathroom once every five to seven days.  The rest is negative.   PHYSICAL EXAMINATION:  VITAL SIGNS:  Blood pressure 145/98, pulse 92, temp  97.9, weight greater than 350 pounds.  HEENT:  Moist mucosa.  NECK:  Supple.  No thyromegaly or bruits.  LUNGS:  Clear.  No wheezes or rales.  HEART:  S1 and S2.  No murmur, no gallop.  ABDOMEN:  Soft, nontender.  No megaly, no mass.  EXTREMITIES:  Without edema.  NEUROLOGIC:  She is alert and cooperative.  Denies being depression.  SKIN:  Clear.   WELLNESS EXAMINATION:  1.  Morbid obesity.  Health-related discussed.  She wants to try to lose      weight.  She says she was quite successful on phentermine before with no      side effects.  Will restart it at 37.5 mg once every morning with three      refills.  I will see her back in three months.  If side effects, she      will discontinue.  2.  Constipation.  She will MiraLax 17 g daily.  Obtain lab work.  3.  Headaches.  Fioricet p.r.n. if Advil/Tylenol does not work.  Given 60      capsules with three refills.  I will see her back in three months.  4.  Will send lab work for wellness.                                   Sonda Primes, MD   AP/MedQ  DD:  10/03/2005  DT:  10/04/2005  Job #:   [id number]

## 2010-08-06 ENCOUNTER — Ambulatory Visit (INDEPENDENT_AMBULATORY_CARE_PROVIDER_SITE_OTHER): Payer: 59 | Admitting: Internal Medicine

## 2010-08-06 ENCOUNTER — Telehealth: Payer: Self-pay | Admitting: *Deleted

## 2010-08-06 ENCOUNTER — Other Ambulatory Visit (INDEPENDENT_AMBULATORY_CARE_PROVIDER_SITE_OTHER): Payer: 59

## 2010-08-06 ENCOUNTER — Encounter: Payer: Self-pay | Admitting: Internal Medicine

## 2010-08-06 DIAGNOSIS — R209 Unspecified disturbances of skin sensation: Secondary | ICD-10-CM

## 2010-08-06 DIAGNOSIS — R002 Palpitations: Secondary | ICD-10-CM

## 2010-08-06 DIAGNOSIS — R202 Paresthesia of skin: Secondary | ICD-10-CM

## 2010-08-06 DIAGNOSIS — R110 Nausea: Secondary | ICD-10-CM

## 2010-08-06 DIAGNOSIS — R55 Syncope and collapse: Secondary | ICD-10-CM

## 2010-08-06 DIAGNOSIS — R609 Edema, unspecified: Secondary | ICD-10-CM

## 2010-08-06 LAB — MAGNESIUM: Magnesium: 2 mg/dL (ref 1.5–2.5)

## 2010-08-06 LAB — CBC WITH DIFFERENTIAL/PLATELET
Basophils Absolute: 0 10*3/uL (ref 0.0–0.1)
Basophils Relative: 0.4 % (ref 0.0–3.0)
Eosinophils Absolute: 0.1 10*3/uL (ref 0.0–0.7)
Eosinophils Relative: 1.9 % (ref 0.0–5.0)
HCT: 31.3 % — ABNORMAL LOW (ref 36.0–46.0)
Hemoglobin: 10.2 g/dL — ABNORMAL LOW (ref 12.0–15.0)
Lymphocytes Relative: 30.3 % (ref 12.0–46.0)
Lymphs Abs: 1.9 10*3/uL (ref 0.7–4.0)
MCHC: 32.7 g/dL (ref 30.0–36.0)
MCV: 82.4 fl (ref 78.0–100.0)
Monocytes Absolute: 0.6 10*3/uL (ref 0.1–1.0)
Monocytes Relative: 8.7 % (ref 3.0–12.0)
Neutro Abs: 3.7 10*3/uL (ref 1.4–7.7)
Neutrophils Relative %: 58.7 % (ref 43.0–77.0)
Platelets: 371 10*3/uL (ref 150.0–400.0)
RBC: 3.8 Mil/uL — ABNORMAL LOW (ref 3.87–5.11)
RDW: 17.1 % — ABNORMAL HIGH (ref 11.5–14.6)
WBC: 6.4 10*3/uL (ref 4.5–10.5)

## 2010-08-06 LAB — COMPREHENSIVE METABOLIC PANEL
ALT: 12 U/L (ref 0–35)
AST: 18 U/L (ref 0–37)
Albumin: 3.2 g/dL — ABNORMAL LOW (ref 3.5–5.2)
Alkaline Phosphatase: 39 U/L (ref 39–117)
BUN: 10 mg/dL (ref 6–23)
CO2: 28 mEq/L (ref 19–32)
Calcium: 8.5 mg/dL (ref 8.4–10.5)
Chloride: 105 mEq/L (ref 96–112)
Creatinine, Ser: 0.6 mg/dL (ref 0.4–1.2)
GFR: 153.83 mL/min (ref >60.00)
Glucose, Bld: 108 mg/dL — ABNORMAL HIGH (ref 70–99)
Potassium: 3.6 mEq/L (ref 3.5–5.1)
Sodium: 140 mEq/L (ref 135–145)
Total Bilirubin: 1.1 mg/dL (ref 0.3–1.2)
Total Protein: 6.5 g/dL (ref 6.0–8.3)

## 2010-08-06 MED ORDER — METOPROLOL SUCCINATE ER 25 MG PO TB24: 25.0000 mg | ORAL_TABLET | Freq: Every day | ORAL | Status: DC

## 2010-08-06 NOTE — Telephone Encounter (Signed)
rec Rf req for Phentermine 37.5 1 qam. # 30. Last filled 05-15-10. Ok to Rf?

## 2010-08-06 NOTE — Assessment & Plan Note (Signed)
Chronic. 

## 2010-08-06 NOTE — Patient Instructions (Signed)
Hold Triamterene Go to ER if problems or call 911

## 2010-08-06 NOTE — Assessment & Plan Note (Signed)
As above -- see Meds and orders. Discussed w/pt

## 2010-08-06 NOTE — Telephone Encounter (Signed)
rec rf req for Phentermine 37.5 1 qam. # 30.Last filled 05/15/10. Ok to Rf?

## 2010-08-06 NOTE — Telephone Encounter (Signed)
No. She can't take it under circumstances Thx

## 2010-08-06 NOTE — Assessment & Plan Note (Signed)
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

## 2010-08-06 NOTE — Progress Notes (Signed)
  Subjective:    Patient ID: Bridget [name], female    DOB: 29-Nov-1975, 35 y.o.   MRN: [id number]  HPI  On [place] last wk [place] [employer] She was standing looking at dresses with her dtr's stroller in front of her. The heart started to race some, her vision changed and she went out. 911 was called.  The second time (last night) the heart was racing x 45 min - did not pass out.  LMP 5/6 on time. Not using contraception.  Review of Systems  Constitutional: Negative for fever, chills and fatigue.       [Obese   HENT: Negative for nosebleeds and sinus pressure.   Eyes: Negative for itching.  Respiratory: Positive for shortness of breath (with rapid heart beat only). Negative for apnea and chest tightness.   Cardiovascular: Positive for palpitations. Negative for chest pain and leg swelling.  Gastrointestinal: Negative for abdominal pain.  Genitourinary: Negative for urgency and flank pain.  Skin: Negative for pallor.  Neurological: Negative for headaches.       Objective:   Physical Exam  Constitutional: She appears well-developed and well-nourished. No distress.       Obese  HENT:  Head: Normocephalic.  Right Ear: External ear normal.  Left Ear: External ear normal.  Nose: Nose normal.  Mouth/Throat: Oropharynx is clear and moist.  Eyes: Conjunctivae are normal. Pupils are equal, round, and reactive to light. Right eye exhibits no discharge. Left eye exhibits no discharge.  Neck: Normal range of motion. Neck supple. No JVD present. No tracheal deviation present. No thyromegaly present.  Cardiovascular: Normal rate, regular rhythm and normal heart sounds.   Pulmonary/Chest: No stridor. No respiratory distress. She has no wheezes.  Abdominal: Soft. Bowel sounds are normal. She exhibits no distension and no mass. There is no tenderness. There is no rebound and no guarding.  Musculoskeletal: She exhibits edema (trace B LE) and tenderness (B calves NT).    Lymphadenopathy:    She has no cervical adenopathy.  Neurological: She displays normal reflexes. No cranial nerve deficit. She exhibits normal muscle tone. Coordination normal.  Skin: No rash noted. No erythema.  Psychiatric: She has a normal mood and affect. Her behavior is normal. Judgment and thought content normal.   Occ. 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 Discussed w/pt  [facility] Chronic

## 2010-08-07 ENCOUNTER — Telehealth: Payer: Self-pay | Admitting: Internal Medicine

## 2010-08-07 ENCOUNTER — Institutional Professional Consult (permissible substitution): Payer: 59 | Admitting: Internal Medicine

## 2010-08-07 LAB — HCG, SERUM, QUALITATIVE: Preg, Serum: NEGATIVE

## 2010-08-07 LAB — FIBRINOGEN: Fibrinogen: 312 mg/dL (ref 204–475)

## 2010-08-07 LAB — TSH: TSH: 0.38 u[IU]/mL (ref 0.35–5.50)

## 2010-08-07 LAB — SEDIMENTATION RATE: Sed Rate: 15 mm/hr (ref 0–22)

## 2010-08-07 LAB — VITAMIN B12: Vitamin B-12: 187 pg/mL — ABNORMAL LOW (ref 211–911)

## 2010-08-07 LAB — VITAMIN D 25 HYDROXY (VIT D DEFICIENCY, FRACTURES): Vit D, 25-Hydroxy: 13 ng/mL — ABNORMAL LOW (ref 30–89)

## 2010-08-07 NOTE — Telephone Encounter (Signed)
Bridget [name], please, inform patient that all labs are normal except for low Vit D. Start Vit D

## 2010-08-08 ENCOUNTER — Telehealth: Payer: Self-pay | Admitting: Internal Medicine

## 2010-08-08 MED ORDER — VITAMIN D3 1.25 MG (50000 UT) PO CAPS: 1.0000 | ORAL_CAPSULE | ORAL | Status: DC

## 2010-08-08 NOTE — Telephone Encounter (Signed)
Bridget [name], please, inform patient that all labs are normal except for low vit D Start Rx

## 2010-08-08 NOTE — Telephone Encounter (Addendum)
Pt was set up for 5-22 and did not come in, said she was told it was today, wants to come in on Friday, said she had to be seen this week for sob/racing heart

## 2010-08-08 NOTE — Telephone Encounter (Signed)
lmom for pt to call me back in regards to her appointment

## 2010-08-08 NOTE — Telephone Encounter (Signed)
Discussed with Dr Johney Frame  Patient will need to be seen next week as no one is here the rest of this week and if she has further syncope will need to go to the ER  I did leave a message for patient earlier and have not heard back

## 2010-08-08 NOTE — Telephone Encounter (Signed)
Left mess for patient to call back.  

## 2010-08-09 NOTE — Telephone Encounter (Signed)
Pt informed

## 2010-09-04 ENCOUNTER — Other Ambulatory Visit: Payer: Self-pay

## 2010-09-04 MED ORDER — TRIAMTERENE-HCTZ 37.5-25 MG PO TABS: 1.0000 | ORAL_TABLET | Freq: Every day | ORAL | Status: DC

## 2010-09-04 MED ORDER — POTASSIUM CHLORIDE 10 MEQ PO TBCR: 10.0000 meq | EXTENDED_RELEASE_TABLET | Freq: Every day | ORAL | Status: DC

## 2010-09-04 MED ORDER — METOPROLOL SUCCINATE ER 25 MG PO TB24: 25.0000 mg | ORAL_TABLET | Freq: Every day | ORAL | Status: DC

## 2010-09-04 MED ORDER — POLYETHYLENE GLYCOL 3350 17 G PO PACK: 17.0000 g | PACK | Freq: Every day | ORAL | Status: DC

## 2010-09-04 MED ORDER — VITAMIN D3 1.25 MG (50000 UT) PO CAPS: 1.0000 | ORAL_CAPSULE | ORAL | Status: DC

## 2010-09-04 NOTE — Telephone Encounter (Signed)
Pt called requesting refill of her medications as she has moved. Medications verified with patient

## 2010-09-21 ENCOUNTER — Ambulatory Visit: Payer: 59 | Admitting: Internal Medicine

## 2010-09-21 DIAGNOSIS — Z0289 Encounter for other administrative examinations: Secondary | ICD-10-CM

## 2010-12-11 LAB — URINALYSIS, ROUTINE W REFLEX MICROSCOPIC
Glucose, UA: NEGATIVE
Hgb urine dipstick: NEGATIVE
Nitrite: NEGATIVE
Protein, ur: NEGATIVE
Specific Gravity, Urine: 1.03
Urobilinogen, UA: 0.2
pH: 5.5

## 2010-12-11 LAB — CBC
HCT: 39.9
Hemoglobin: 13.4
MCHC: 33.7
MCV: 94.7
Platelets: 372
RBC: 4.22
RDW: 12.9
WBC: 10.4

## 2010-12-11 LAB — BASIC METABOLIC PANEL
BUN: 9
CO2: 30
Calcium: 9.2
Chloride: 105
Creatinine, Ser: 0.62
GFR calc Af Amer: >60
GFR calc non Af Amer: >60
Glucose, Bld: 104 — ABNORMAL HIGH
Potassium: 3.2 — ABNORMAL LOW
Sodium: 141

## 2010-12-11 LAB — DIFFERENTIAL
Basophils Absolute: 0.1
Basophils Relative: 1
Eosinophils Absolute: 0.1
Eosinophils Relative: 1
Lymphocytes Relative: 27
Lymphs Abs: 2.8
Monocytes Absolute: 0.7
Monocytes Relative: 7
Neutro Abs: 6.6
Neutrophils Relative %: 64

## 2010-12-11 LAB — URINE MICROSCOPIC-ADD ON

## 2010-12-11 LAB — POCT CARDIAC MARKERS
CKMB, poc: <1 — ABNORMAL LOW
Myoglobin, poc: 31.4
Operator id: 2725
Troponin i, poc: <0.05

## 2010-12-11 LAB — PREGNANCY, URINE: Preg Test, Ur: NEGATIVE

## 2010-12-11 LAB — D-DIMER, QUANTITATIVE (NOT AT ARMC): D-Dimer, Quant: 0.28

## 2011-12-13 ENCOUNTER — Encounter: Payer: Self-pay | Admitting: Internal Medicine

## 2011-12-13 ENCOUNTER — Ambulatory Visit (INDEPENDENT_AMBULATORY_CARE_PROVIDER_SITE_OTHER): Payer: 59 | Admitting: Internal Medicine

## 2011-12-13 VITALS — BP 146/100 | HR 80 | Temp 98.5°F | Resp 16 | Wt 337.0 lb

## 2011-12-13 DIAGNOSIS — E538 Deficiency of other specified B group vitamins: Secondary | ICD-10-CM

## 2011-12-13 DIAGNOSIS — H6690 Otitis media, unspecified, unspecified ear: Secondary | ICD-10-CM

## 2011-12-13 DIAGNOSIS — R5383 Other fatigue: Secondary | ICD-10-CM

## 2011-12-13 DIAGNOSIS — R5381 Other malaise: Secondary | ICD-10-CM

## 2011-12-13 DIAGNOSIS — E559 Vitamin D deficiency, unspecified: Secondary | ICD-10-CM

## 2011-12-13 MED ORDER — LORATADINE 10 MG PO TABS: 10.0000 mg | ORAL_TABLET | Freq: Every day | ORAL | Status: DC

## 2011-12-13 MED ORDER — AMOXICILLIN-POT CLAVULANATE 875-125 MG PO TABS: 1.0000 | ORAL_TABLET | Freq: Two times a day (BID) | ORAL | Status: DC

## 2011-12-13 MED ORDER — TRIAMTERENE-HCTZ 37.5-25 MG PO TABS: 1.0000 | ORAL_TABLET | Freq: Every day | ORAL | Status: DC

## 2011-12-13 MED ORDER — VITAMIN B-12 1000 MCG SL SUBL: 1.0000 | SUBLINGUAL_TABLET | Freq: Every day | SUBLINGUAL | Status: DC

## 2011-12-13 MED ORDER — PHENTERMINE HCL 37.5 MG PO TABS: 37.5000 mg | ORAL_TABLET | Freq: Every day | ORAL | Status: DC

## 2011-12-13 MED ORDER — PSEUDOEPHEDRINE HCL ER 120 MG PO TB12: 120.0000 mg | ORAL_TABLET | Freq: Two times a day (BID) | ORAL | Status: DC | PRN

## 2011-12-13 MED ORDER — POLYETHYLENE GLYCOL 3350 17 G PO PACK: 17.0000 g | PACK | Freq: Every day | ORAL | Status: DC

## 2011-12-13 MED ORDER — AZELASTINE-FLUTICASONE 137-50 MCG/ACT NA SUSP: 1.0000 | NASAL | Status: DC

## 2011-12-13 NOTE — Assessment & Plan Note (Signed)
B12 level 

## 2011-12-13 NOTE — Patient Instructions (Signed)
[place]

## 2011-12-13 NOTE — Progress Notes (Signed)
Patient ID: Bridget [name], female   DOB: [birth year], 36 y.o.   MRN: [id number]  Subjective:    Patient ID: Bridget [name], female    DOB: March 15, 1976, 36 y.o.   MRN: [id number]  Otalgia  There is pain in the left ear. This is a new problem. The current episode started 1 to 4 weeks ago. The problem occurs constantly. There has been no fever. The pain is mild. Pertinent negatives include no abdominal pain or headaches. Her past medical history is significant for hearing loss.    On Thur last wk [place] [employer]. She was standing looking at dresses with her dtr's stroller in front of her. The heart started to race some, her vision changed and she went out. 911 was called.  The second time (last night) the heart was racing x 45 min - did not pass out.  LMP 5/6 on time. Not using contraception.  Review of Systems  Constitutional: Negative for fever, chills and fatigue.       Obese   HENT: Positive for ear pain. Negative for nosebleeds and sinus pressure.   Eyes: Negative for itching.  Respiratory: Positive for shortness of breath (with rapid heart beat only). Negative for apnea and chest tightness.   Cardiovascular: Positive for palpitations. Negative for chest pain and leg swelling.  Gastrointestinal: Negative for abdominal pain.  Genitourinary: Negative for urgency and flank pain.  Skin: Negative for pallor.  Neurological: Negative for headaches.       Objective:   Physical Exam  Constitutional: She appears well-developed and well-nourished. No distress.       Obese  HENT:  Head: Normocephalic.  Right Ear: External ear normal.  Left Ear: External ear normal.  Nose: Nose normal.  Mouth/Throat: Oropharynx is clear and moist.  Eyes: Conjunctivae normal are normal. Pupils are equal, round, and reactive to light. Right eye exhibits no discharge. Left eye exhibits no discharge.  Neck: Normal range of motion. Neck supple. No JVD present. No tracheal deviation present.  No thyromegaly present.  Cardiovascular: Normal rate, regular rhythm and normal heart sounds.   Pulmonary/Chest: No stridor. No respiratory distress. She has no wheezes.  Abdominal: Soft. Bowel sounds are normal. She exhibits no distension and no mass. There is no tenderness. There is no rebound and no guarding.  Musculoskeletal: She exhibits edema (trace B LE) and tenderness (B calves NT).  Lymphadenopathy:    She has no cervical adenopathy.  Neurological: She displays normal reflexes. No cranial nerve deficit. She exhibits normal muscle tone. Coordination normal.  Skin: No rash noted. No erythema.  Psychiatric: She has a normal mood and affect. Her behavior is normal. Judgment and thought content normal.   Occ. irreg beats were noted       Assessment & Plan:   No problem-specific assessment & plan notes found for this encounter.

## 2011-12-15 ENCOUNTER — Encounter: Payer: Self-pay | Admitting: Internal Medicine

## 2011-12-15 NOTE — Assessment & Plan Note (Signed)
9/13 Lap Band was discussed

## 2011-12-15 NOTE — Assessment & Plan Note (Signed)
Re-start B12 

## 2012-03-04 ENCOUNTER — Telehealth: Payer: Self-pay | Admitting: Internal Medicine

## 2012-03-04 NOTE — Telephone Encounter (Signed)
Patient Information:  Caller Name: Bridget [name]  Phone: [phone]  Patient: Bridget [name]  Gender: Female  DOB: 06/09/1974  Age: 36 Years  PCP: Plotnikov, Alex (Adults only)  Pregnant: No  Office Follow Up:  Does the office need to follow up with this patient?: Yes  Instructions For The Office: Dr. Lajoyce Corners on [facility], has an appt. scheduled there today at 215.  Is this ok?  RN Note:  No known injury.  Patient is sure that she will need an xray and wants to know if she needs to go to an UC rather then the office visit?  She called out of work today and has to be taken care of today.  Symptoms  Reason For Call & Symptoms: Patient calling, has swelling and bruising and pain on the left ankle.  Same has worsened over the past 2 days.  Reviewed Health History In EMR: Yes  Reviewed Medications In EMR: Yes  Reviewed Allergies In EMR: Yes  Reviewed Surgeries / Procedures: Yes  Date of Onset of Symptoms: 03/02/2012 OB:  LMP: 03/04/2012  Guideline(s) Used:  Ankle Pain  Disposition Per Guideline:   Go to Office Now  Reason For Disposition Reached:   Severe pain (e.g., excruciating, unable to walk) and not improved after 2 hours of pain medicine  Advice Given:  N/A  Patient Refused Recommendation:  Patient Will Go To [facility]C.  Wants to go to UC or be referred for the xray.  Wants 1 OV to take care of all of this.

## 2012-03-05 NOTE — Telephone Encounter (Signed)
Noted. Thx.

## 2012-03-09 ENCOUNTER — Other Ambulatory Visit: Payer: Self-pay | Admitting: Orthopedic Surgery

## 2012-03-09 DIAGNOSIS — M25572 Pain in left ankle and joints of left foot: Secondary | ICD-10-CM

## 2012-03-09 DIAGNOSIS — R531 Weakness: Secondary | ICD-10-CM

## 2012-03-09 DIAGNOSIS — R609 Edema, unspecified: Secondary | ICD-10-CM

## 2012-03-18 LAB — HM PAP SMEAR

## 2012-06-17 ENCOUNTER — Telehealth: Payer: Self-pay | Admitting: *Deleted

## 2012-06-17 NOTE — Telephone Encounter (Signed)
Rf req for Phentermine 37.5 mg 1 po qam. # 30. Last filled 12/14/11. Ok to Rf?

## 2012-06-17 NOTE — Telephone Encounter (Signed)
OK to fill this prescription with additional refills x0 - needs OV Thank you!

## 2012-06-18 MED ORDER — PHENTERMINE HCL 37.5 MG PO TABS: 37.5000 mg | ORAL_TABLET | Freq: Every day | ORAL | Status: DC

## 2012-06-18 NOTE — Telephone Encounter (Signed)
Done

## 2012-06-23 ENCOUNTER — Telehealth: Payer: Self-pay | Admitting: Internal Medicine

## 2012-06-23 NOTE — Telephone Encounter (Signed)
Called pt to schedule an follow up appt with Plot, pt stated she is working and will call back to schedule the appt her self.

## 2012-08-26 ENCOUNTER — Ambulatory Visit: Payer: 59 | Admitting: Internal Medicine

## 2012-08-26 DIAGNOSIS — Z0289 Encounter for other administrative examinations: Secondary | ICD-10-CM

## 2012-11-06 ENCOUNTER — Encounter (HOSPITAL_COMMUNITY): Payer: Self-pay | Admitting: *Deleted

## 2012-11-06 ENCOUNTER — Emergency Department (HOSPITAL_COMMUNITY)
Admission: EM | Admit: 2012-11-06 | Discharge: 2012-11-07 | Disposition: A | Discharge status: Home / Self Care | Payer: 59 | Attending: Emergency Medicine | Admitting: Emergency Medicine

## 2012-11-06 DIAGNOSIS — Z79899 Other long term (current) drug therapy: Secondary | ICD-10-CM | POA: Insufficient documentation

## 2012-11-06 DIAGNOSIS — R110 Nausea: Secondary | ICD-10-CM | POA: Insufficient documentation

## 2012-11-06 DIAGNOSIS — Z8719 Personal history of other diseases of the digestive system: Secondary | ICD-10-CM | POA: Insufficient documentation

## 2012-11-06 DIAGNOSIS — Z3202 Encounter for pregnancy test, result negative: Secondary | ICD-10-CM | POA: Insufficient documentation

## 2012-11-06 DIAGNOSIS — R519 Headache, unspecified: Secondary | ICD-10-CM

## 2012-11-06 DIAGNOSIS — R51 Headache: Secondary | ICD-10-CM | POA: Insufficient documentation

## 2012-11-06 DIAGNOSIS — H53149 Visual discomfort, unspecified: Secondary | ICD-10-CM | POA: Insufficient documentation

## 2012-11-06 MED ORDER — DIPHENHYDRAMINE HCL 25 MG PO CAPS
50.0000 mg | ORAL_CAPSULE | Freq: Once | ORAL | Status: AC
  Administered 2012-11-06: 50 mg via ORAL
  Filled 2012-11-06: qty 2

## 2012-11-06 MED ORDER — KETOROLAC TROMETHAMINE 60 MG/2ML IM SOLN
60.0000 mg | Freq: Once | INTRAMUSCULAR | Status: AC
  Administered 2012-11-06: 60 mg via INTRAMUSCULAR
  Filled 2012-11-06: qty 2

## 2012-11-06 MED ORDER — METOCLOPRAMIDE HCL 5 MG/ML IJ SOLN
10.0000 mg | Freq: Once | INTRAMUSCULAR | Status: AC
  Administered 2012-11-06: 10 mg via INTRAMUSCULAR
  Filled 2012-11-06: qty 2

## 2012-11-06 NOTE — ED Notes (Signed)
Pt reports onset of migraine HA that began on Tuesday, pt admits to light/sound sensitivity and some nausea denies vomiting. Pt has taken ibuprofen at home w/o relief. Pt denies hx of migraines.

## 2012-11-06 NOTE — ED Provider Notes (Signed)
CSN: [id number]     Arrival date & time 11/06/12  2233 History     First MD Initiated Contact with Patient 11/06/12 2257     Chief Complaint  Patient presents with  . Migraine   (Consider location/radiation/quality/duration/timing/severity/associated sxs/prior Treatment) HPI Comments: 37 yo female with HA, obesity, edema hx presents with gradual onset HA 3 days ago, became severe over 1.5 days, frontal behind eyes similar to previous except lasting longer.  Pt has had multiple stressors/ deaths in the family.  No fevers or neck pain.  No PE/ dvt, leg pain, unilateral swelling or estrogen hx.  No cns hx.  Improves with otc but then returns.  Not worse in am.  Pt has ha twice per week.  Started on bp med recently.  No injuries.   Patient is a 37 y.o. female presenting with migraines. The history is provided by the patient.  Migraine This is a recurrent problem. Associated symptoms include headaches. Pertinent negatives include no chest pain, no abdominal pain and no shortness of breath.    Past Medical History  Diagnosis Date  . Edema   . Morbid obesity   . Headache(784.0)   . Constipation    Past Surgical History  Procedure Laterality Date  . Breast reduction surgery     Family History  Problem Relation Age of Onset  . Deep vein thrombosis Father   . Hypertension Other    History  Substance Use Topics  . Smoking status: Never Smoker   . Smokeless tobacco: Not on file  . Alcohol Use: Yes     Comment: occasionally   OB History   Grav Para Term Preterm Abortions TAB SAB Ect Mult Living                 Review of Systems  Constitutional: Negative for fever and chills.  HENT: Negative for neck pain and neck stiffness.   Eyes: Positive for photophobia. Negative for visual disturbance.  Respiratory: Negative for shortness of breath.   Cardiovascular: Negative for chest pain.  Gastrointestinal: Positive for nausea. Negative for vomiting and abdominal pain.  Genitourinary:  Negative for dysuria and flank pain.  Musculoskeletal: Negative for back pain.  Skin: Negative for rash.  Neurological: Positive for headaches. Negative for light-headedness.    Allergies  Review of patient's allergies indicates no known allergies.  Home Medications   Current Outpatient Rx  Name  Route  Sig  Dispense  Refill  . amoxicillin-clavulanate (AUGMENTIN) 875-125 MG per tablet   Oral   Take 1 tablet by mouth 2 (two) times daily.   28 tablet   1   . Azelastine-Fluticasone (DYMISTA) 137-50 MCG/ACT SUSP   Nasal   Place 1 Act into the nose 1 day or 1 dose.   23 g   5   . Cholecalciferol (VITAMIN D3) 50000 UNITS CAPS   Oral   Take 1 capsule by mouth once a week.   12 capsule   1   . Cyanocobalamin (VITAMIN B-12) 1000 MCG SUBL   Sublingual   Place 1 tablet (1,000 mcg total) under the tongue daily.   100 tablet   3   . ibuprofen (ADVIL,MOTRIN) 600 MG tablet   Oral   Take 600 mg by mouth as needed.           . loratadine (CLARITIN) 10 MG tablet   Oral   Take 1 tablet (10 mg total) by mouth daily.   100 tablet   3   .  phentermine (ADIPEX-P) 37.5 MG tablet   Oral   Take 1 tablet (37.5 mg total) by mouth daily before breakfast.   30 tablet   0   . polyethylene glycol (MIRALAX / GLYCOLAX) packet   Oral   Take 17 g by mouth daily.   100 each   3   . potassium chloride (KLOR-CON) 10 MEQ CR tablet   Oral   Take 1 tablet (10 mEq total) by mouth daily.   90 tablet   1   . pseudoephedrine (SUDAFED) 120 MG 12 hr tablet   Oral   Take 1 tablet (120 mg total) by mouth 2 (two) times daily as needed for congestion.   20 tablet   2   . triamterene-hydrochlorothiazide (MAXZIDE-25) 37.5-25 MG per tablet   Oral   Take 1 each (1 tablet total) by mouth daily.   90 tablet   1    BP 139/93  Pulse 72  Temp(Src) 98.4 F (36.9 C) (Oral)  Resp 16  SpO2 96%  LMP 11/05/2012 Physical Exam  Nursing note and vitals reviewed. Constitutional: She is oriented to  person, place, and time. She appears well-developed and well-nourished.  HENT:  Head: Normocephalic and atraumatic.  Eyes: Conjunctivae are normal. Right eye exhibits no discharge. Left eye exhibits no discharge.  Neck: Normal range of motion. Neck supple. No tracheal deviation present.  Cardiovascular: Normal rate and regular rhythm.   Pulmonary/Chest: Effort normal.  Abdominal: Soft.  obese  Musculoskeletal: She exhibits no edema.  Neurological: She is alert and oriented to person, place, and time. GCS eye subscore is 4. GCS verbal subscore is 5. GCS motor subscore is 6.  5+ strength in UE and LE with f/e at major joints. Sensation to palpation intact in UE and LE. CNs 2-12 grossly intact.  EOMFI.  PERRL.   Finger nose and coordination intact bilateral.   Visual fields intact to finger testing. No papilledema  Skin: Skin is warm. No rash noted.  Psychiatric: She has a normal mood and affect.    ED Course   Procedures (including critical care time)  Labs Reviewed - No data to display No results found. No diagnosis found.  MDM  Similar previous but more prolonged.  Likely acute on chronic with stressors.  Normal neuro. No red flags.  DIscussed working on improved dietary intake/ exercise, pt is motivated. IM and po meds given. Well appearing in ED.  HA resolved on recheck. Fup discussed   Enid Skeens, MD 11/07/12 [phone]

## 2012-11-07 LAB — POCT PREGNANCY, URINE: Preg Test, Ur: NEGATIVE

## 2012-11-07 MED ORDER — METOCLOPRAMIDE HCL 10 MG PO TABS: 10.0000 mg | ORAL_TABLET | Freq: Four times a day (QID) | ORAL | Status: DC | PRN

## 2013-01-14 ENCOUNTER — Telehealth: Payer: Self-pay | Admitting: Internal Medicine

## 2013-01-14 MED ORDER — PHENTERMINE HCL 37.5 MG PO TABS: 37.5000 mg | ORAL_TABLET | Freq: Every day | ORAL | Status: DC

## 2013-01-14 NOTE — Telephone Encounter (Signed)
Pt called stated that she went to her drug store to get the phentermine refill, but she was told by the pharmacy that medication is no longer available because pt never pick it up within 6 month. Pt stated that pharmacy never inform her that her med is ready back in 06/2012 and pt never pick it up. Pt request refill for phentermine and schedule an appt for follow up with Dr. Macario Golds 01/19/13. Please call pt.

## 2013-01-14 NOTE — Telephone Encounter (Signed)
Done

## 2013-01-14 NOTE — Telephone Encounter (Signed)
Ok to ref Thx 

## 2013-01-19 ENCOUNTER — Ambulatory Visit (INDEPENDENT_AMBULATORY_CARE_PROVIDER_SITE_OTHER): Payer: 59 | Admitting: Internal Medicine

## 2013-01-19 ENCOUNTER — Encounter: Payer: Self-pay | Admitting: Internal Medicine

## 2013-01-19 ENCOUNTER — Other Ambulatory Visit (INDEPENDENT_AMBULATORY_CARE_PROVIDER_SITE_OTHER): Payer: 59

## 2013-01-19 DIAGNOSIS — M545 Low back pain, unspecified: Secondary | ICD-10-CM

## 2013-01-19 DIAGNOSIS — E559 Vitamin D deficiency, unspecified: Secondary | ICD-10-CM

## 2013-01-19 DIAGNOSIS — R7309 Other abnormal glucose: Secondary | ICD-10-CM

## 2013-01-19 DIAGNOSIS — E538 Deficiency of other specified B group vitamins: Secondary | ICD-10-CM

## 2013-01-19 DIAGNOSIS — R739 Hyperglycemia, unspecified: Secondary | ICD-10-CM

## 2013-01-19 LAB — HEPATIC FUNCTION PANEL
ALT: 12 U/L (ref 0–35)
AST: 14 U/L (ref 0–37)
Albumin: 3.5 g/dL (ref 3.5–5.2)
Alkaline Phosphatase: 41 U/L (ref 39–117)
Bilirubin, Direct: 0.1 mg/dL (ref 0.0–0.3)
Total Bilirubin: 0.7 mg/dL (ref 0.3–1.2)
Total Protein: 6.9 g/dL (ref 6.0–8.3)

## 2013-01-19 LAB — CBC
HCT: 40.6 % (ref 36.0–46.0)
Hemoglobin: 13.6 g/dL (ref 12.0–15.0)
MCHC: 33.6 g/dL (ref 30.0–36.0)
MCV: 93.4 fl (ref 78.0–100.0)
Platelets: 319 10*3/uL (ref 150.0–400.0)
RBC: 4.34 Mil/uL (ref 3.87–5.11)
RDW: 13 % (ref 11.5–14.6)
WBC: 8.9 10*3/uL (ref 4.5–10.5)

## 2013-01-19 LAB — BASIC METABOLIC PANEL
BUN: 17 mg/dL (ref 6–23)
CO2: 26 mEq/L (ref 19–32)
Calcium: 9 mg/dL (ref 8.4–10.5)
Chloride: 106 mEq/L (ref 96–112)
Creatinine, Ser: 0.7 mg/dL (ref 0.4–1.2)
GFR: 132.93 mL/min (ref >60.00)
Glucose, Bld: 112 mg/dL — ABNORMAL HIGH (ref 70–99)
Potassium: 4 mEq/L (ref 3.5–5.1)
Sodium: 138 mEq/L (ref 135–145)

## 2013-01-19 LAB — VITAMIN B12: Vitamin B-12: 208 pg/mL — ABNORMAL LOW (ref 211–911)

## 2013-01-19 LAB — TSH: TSH: 0.76 u[IU]/mL (ref 0.35–5.50)

## 2013-01-19 LAB — HEMOGLOBIN A1C: Hgb A1c MFr Bld: 5.4 % (ref 4.6–6.5)

## 2013-01-19 MED ORDER — VITAMIN B-12 1000 MCG SL SUBL: 1.0000 | SUBLINGUAL_TABLET | Freq: Every day | SUBLINGUAL | Status: DC

## 2013-01-19 MED ORDER — POTASSIUM CHLORIDE CRYS ER 20 MEQ PO TBCR: 20.0000 meq | EXTENDED_RELEASE_TABLET | Freq: Two times a day (BID) | ORAL | Status: DC

## 2013-01-19 MED ORDER — TRIAMTERENE-HCTZ 37.5-25 MG PO TABS: 1.0000 | ORAL_TABLET | Freq: Every day | ORAL | Status: DC

## 2013-01-19 MED ORDER — PHENTERMINE HCL 37.5 MG PO TABS: 37.5000 mg | ORAL_TABLET | Freq: Every day | ORAL | Status: DC

## 2013-01-19 MED ORDER — CHOLECALCIFEROL 50 MCG (2000 UT) PO CAPS: ORAL_CAPSULE | ORAL | Status: DC

## 2013-01-19 NOTE — Assessment & Plan Note (Signed)
Re- start Vit B12 °

## 2013-01-19 NOTE — Progress Notes (Signed)
Pre-visit discussion using our clinic review tool. No additional management support is needed unless otherwise documented below in the visit note.  

## 2013-01-19 NOTE — Assessment & Plan Note (Signed)
9/13, 11/14 Lap Band was discussed

## 2013-01-19 NOTE — Progress Notes (Signed)
  Subjective:    Patient ID: Bridget [name], female    DOB: 01-14-1976, 37 y.o.   MRN: [id number]  HPI  C/o obesity F/u B12 and Vit D def  Review of Systems  Constitutional: Negative for chills, activity change, appetite change, fatigue and unexpected weight change.  HENT: Negative for congestion, mouth sores and sinus pressure.   Eyes: Negative for visual disturbance.  Respiratory: Negative for cough, chest tightness and wheezing.   Cardiovascular: Negative for chest pain and palpitations.  Gastrointestinal: Negative for nausea and abdominal pain.  Genitourinary: Negative for frequency, difficulty urinating and vaginal pain.  Musculoskeletal: Negative for back pain and gait problem.  Skin: Negative for pallor and rash.  Neurological: Negative for dizziness, tremors, weakness, numbness and headaches.  Psychiatric/Behavioral: Negative for confusion and sleep disturbance. The patient is not nervous/anxious.        Objective:   Physical Exam  Constitutional: She appears well-developed. No distress.  Obese   HENT:  Head: Normocephalic.  Right Ear: External ear normal.  Left Ear: External ear normal.  Nose: Nose normal.  Mouth/Throat: Oropharynx is clear and moist.  Eyes: Conjunctivae are normal. Pupils are equal, round, and reactive to light. Right eye exhibits no discharge. Left eye exhibits no discharge.  Neck: Normal range of motion. Neck supple. No JVD present. No tracheal deviation present. No thyromegaly present.  Cardiovascular: Normal rate, regular rhythm and normal heart sounds.   Pulmonary/Chest: No stridor. No respiratory distress. She has no wheezes.  Abdominal: Soft. Bowel sounds are normal. She exhibits no distension and no mass. There is no tenderness. There is no rebound and no guarding.  Musculoskeletal: She exhibits no edema and no tenderness.  Lymphadenopathy:    She has no cervical adenopathy.  Neurological: She displays normal reflexes. No cranial nerve  deficit. She exhibits normal muscle tone. Coordination normal.  Skin: No rash noted. No erythema.  Psychiatric: She has a normal mood and affect. Her behavior is normal. Judgment and thought content normal.          Assessment & Plan:

## 2013-01-19 NOTE — Assessment & Plan Note (Signed)
Sporadic MSK

## 2013-01-19 NOTE — Assessment & Plan Note (Signed)
Re-start Vit D 

## 2013-01-19 NOTE — Patient Instructions (Signed)
Almond Breeze milk  Wt Readings from Last 3 Encounters:  01/19/13 330 lb 8 oz (149.914 kg)  12/13/11 337 lb (152.862 kg)  08/06/10 326 lb (147.873 kg)   BP Readings from Last 3 Encounters:  01/19/13 122/74  11/06/12 139/93  12/13/11 146/100

## 2013-01-20 LAB — VITAMIN D 25 HYDROXY (VIT D DEFICIENCY, FRACTURES): Vit D, 25-Hydroxy: <10 ng/mL — ABNORMAL LOW (ref 30–89)

## 2013-02-16 ENCOUNTER — Ambulatory Visit: Payer: 59 | Admitting: Internal Medicine

## 2014-05-03 ENCOUNTER — Telehealth: Payer: Self-pay | Admitting: Internal Medicine

## 2014-05-03 NOTE — Telephone Encounter (Signed)
Bridget [name], this is the pt I called you about.  appt is on Friday for med refills and she has not been seen since 01/2013.  She was not polite and said she just wanted to to forward her to you VM and Also Said that Dr Camila Li knows that she just comes in for her physical and that's it.  She asked for my name because she was not happy I have to send you a phone note to call her back.  Just FYI

## 2014-05-04 MED ORDER — TRIAMTERENE-HCTZ 37.5-25 MG PO TABS: 1.0000 | ORAL_TABLET | Freq: Every day | ORAL | Status: DC

## 2014-05-04 NOTE — Telephone Encounter (Signed)
Rf sent to [place] to call pt. No answer/unable to leave vm.

## 2014-05-06 ENCOUNTER — Other Ambulatory Visit (INDEPENDENT_AMBULATORY_CARE_PROVIDER_SITE_OTHER): Payer: 59

## 2014-05-06 ENCOUNTER — Encounter: Payer: Self-pay | Admitting: Internal Medicine

## 2014-05-06 ENCOUNTER — Ambulatory Visit (INDEPENDENT_AMBULATORY_CARE_PROVIDER_SITE_OTHER): Payer: 59 | Admitting: Internal Medicine

## 2014-05-06 VITALS — BP 128/90 | HR 94 | Ht 64.0 in | Wt 350.0 lb

## 2014-05-06 DIAGNOSIS — R5382 Chronic fatigue, unspecified: Secondary | ICD-10-CM

## 2014-05-06 DIAGNOSIS — Z0000 Encounter for general adult medical examination without abnormal findings: Secondary | ICD-10-CM

## 2014-05-06 DIAGNOSIS — Z23 Encounter for immunization: Secondary | ICD-10-CM

## 2014-05-06 DIAGNOSIS — E559 Vitamin D deficiency, unspecified: Secondary | ICD-10-CM

## 2014-05-06 DIAGNOSIS — E538 Deficiency of other specified B group vitamins: Secondary | ICD-10-CM

## 2014-05-06 LAB — HEPATIC FUNCTION PANEL
ALT: 13 U/L (ref 0–35)
AST: 14 U/L (ref 0–37)
Albumin: 3.4 g/dL — ABNORMAL LOW (ref 3.5–5.2)
Alkaline Phosphatase: 40 U/L (ref 39–117)
Bilirubin, Direct: 0.2 mg/dL (ref 0.0–0.3)
Total Bilirubin: 0.8 mg/dL (ref 0.2–1.2)
Total Protein: 6.3 g/dL (ref 6.0–8.3)

## 2014-05-06 LAB — BASIC METABOLIC PANEL
BUN: 10 mg/dL (ref 6–23)
CO2: 30 mEq/L (ref 19–32)
Calcium: 8.7 mg/dL (ref 8.4–10.5)
Chloride: 107 mEq/L (ref 96–112)
Creatinine, Ser: 0.52 mg/dL (ref 0.40–1.20)
GFR: 170.73 mL/min (ref >60.00)
Glucose, Bld: 81 mg/dL (ref 70–99)
Potassium: 3.6 mEq/L (ref 3.5–5.1)
Sodium: 139 mEq/L (ref 135–145)

## 2014-05-06 LAB — CBC WITH DIFFERENTIAL/PLATELET
Basophils Absolute: 0 10*3/uL (ref 0.0–0.1)
Basophils Relative: 0.4 % (ref 0.0–3.0)
Eosinophils Absolute: 0.3 10*3/uL (ref 0.0–0.7)
Eosinophils Relative: 3.5 % (ref 0.0–5.0)
HCT: 37.3 % (ref 36.0–46.0)
Hemoglobin: 12.7 g/dL (ref 12.0–15.0)
Lymphocytes Relative: 25.8 % (ref 12.0–46.0)
Lymphs Abs: 2.1 10*3/uL (ref 0.7–4.0)
MCHC: 34.2 g/dL (ref 30.0–36.0)
MCV: 92.7 fl (ref 78.0–100.0)
Monocytes Absolute: 0.5 10*3/uL (ref 0.1–1.0)
Monocytes Relative: 6.4 % (ref 3.0–12.0)
Neutro Abs: 5.2 10*3/uL (ref 1.4–7.7)
Neutrophils Relative %: 63.9 % (ref 43.0–77.0)
Platelets: 351 10*3/uL (ref 150.0–400.0)
RBC: 4.02 Mil/uL (ref 3.87–5.11)
RDW: 13.3 % (ref 11.5–15.5)
WBC: 8.2 10*3/uL (ref 4.0–10.5)

## 2014-05-06 LAB — LIPID PANEL
Cholesterol: 129 mg/dL (ref 0–200)
HDL: 35.6 mg/dL — ABNORMAL LOW (ref >39.00)
LDL Cholesterol: 70 mg/dL (ref 0–99)
NonHDL: 93.4
Total CHOL/HDL Ratio: 4
Triglycerides: 119 mg/dL (ref 0.0–149.0)
VLDL: 23.8 mg/dL (ref 0.0–40.0)

## 2014-05-06 LAB — HEMOGLOBIN A1C: Hgb A1c MFr Bld: 5.3 % (ref 4.6–6.5)

## 2014-05-06 LAB — VITAMIN B12: Vitamin B-12: 316 pg/mL (ref 211–911)

## 2014-05-06 LAB — TSH: TSH: 0.88 u[IU]/mL (ref 0.35–4.50)

## 2014-05-06 LAB — VITAMIN D 25 HYDROXY (VIT D DEFICIENCY, FRACTURES): VITD: 5.28 ng/mL — ABNORMAL LOW (ref 30.00–100.00)

## 2014-05-06 MED ORDER — TRIAMTERENE-HCTZ 37.5-25 MG PO TABS: 1.0000 | ORAL_TABLET | Freq: Every day | ORAL | Status: DC

## 2014-05-06 MED ORDER — CYANOCOBALAMIN 1000 MCG/ML IJ SOLN: 1000.0000 ug | INTRAMUSCULAR | Status: DC

## 2014-05-06 MED ORDER — POTASSIUM CHLORIDE CRYS ER 20 MEQ PO TBCR: 20.0000 meq | EXTENDED_RELEASE_TABLET | Freq: Two times a day (BID) | ORAL | Status: DC

## 2014-05-06 MED ORDER — PHENTERMINE HCL 37.5 MG PO TABS: 37.5000 mg | ORAL_TABLET | Freq: Every day | ORAL | Status: DC

## 2014-05-06 MED ORDER — "SYRINGE/NEEDLE (DISP) 30G X 1/2"" 1 ML MISC": 1.0000 | Status: DC

## 2014-05-06 NOTE — Assessment & Plan Note (Signed)
We discussed age appropriate health related issues, including available/recomended screening tests and vaccinations. We discussed a need for adhering to healthy diet and exercise. Labs/EKG were reviewed/ordered. All questions were answered.   

## 2014-05-06 NOTE — Patient Instructions (Signed)
Low carb diet 

## 2014-05-06 NOTE — Assessment & Plan Note (Signed)
Low carb diet Wt Readings from Last 3 Encounters:  05/06/14 350 lb (158.759 kg)  01/19/13 330 lb 8 oz (149.914 kg)  12/13/11 337 lb (152.862 kg)

## 2014-05-06 NOTE — Progress Notes (Signed)
  Subjective:    HPI  The patient is here for a wellness exam. C/o fatigue  C/o obesity - worse F/u B12 and Vit D def  BP Readings from Last 3 Encounters:  05/06/14 128/90  01/19/13 122/74  11/06/12 139/93      Review of Systems  Constitutional: Negative for chills, activity change, appetite change, fatigue and unexpected weight change.  HENT: Negative for congestion, mouth sores and sinus pressure.   Eyes: Negative for visual disturbance.  Respiratory: Negative for cough, chest tightness and wheezing.   Cardiovascular: Negative for chest pain and palpitations.  Gastrointestinal: Negative for nausea and abdominal pain.  Genitourinary: Negative for frequency, difficulty urinating and vaginal pain.  Musculoskeletal: Negative for back pain and gait problem.  Skin: Negative for pallor and rash.  Neurological: Negative for dizziness, tremors, weakness, numbness and headaches.  Psychiatric/Behavioral: Negative for confusion and sleep disturbance. The patient is not nervous/anxious.        Objective:   Physical Exam  Constitutional: She appears well-developed. No distress.  Obese   HENT:  Head: Normocephalic.  Right Ear: External ear normal.  Left Ear: External ear normal.  Nose: Nose normal.  Mouth/Throat: Oropharynx is clear and moist.  Eyes: Conjunctivae are normal. Pupils are equal, round, and reactive to light. Right eye exhibits no discharge. Left eye exhibits no discharge.  Neck: Normal range of motion. Neck supple. No JVD present. No tracheal deviation present. No thyromegaly present.  Cardiovascular: Normal rate, regular rhythm and normal heart sounds.   Pulmonary/Chest: No stridor. No respiratory distress. She has no wheezes.  Abdominal: Soft. Bowel sounds are normal. She exhibits no distension and no mass. There is no tenderness. There is no rebound and no guarding.  Musculoskeletal: She exhibits no edema or tenderness.  Lymphadenopathy:    She has no cervical  adenopathy.  Neurological: She displays normal reflexes. No cranial nerve deficit. She exhibits normal muscle tone. Coordination normal.  Skin: No rash noted. No erythema.  Psychiatric: She has a normal mood and affect. Her behavior is normal. Judgment and thought content normal.   Lab Results  Component Value Date   WBC 8.9 01/19/2013   HGB 13.6 01/19/2013   HCT 40.6 01/19/2013   PLT 319.0 01/19/2013   GLUCOSE 112* 01/19/2013   ALT 12 01/19/2013   AST 14 01/19/2013   NA 138 01/19/2013   K 4.0 01/19/2013   CL 106 01/19/2013   CREATININE 0.7 01/19/2013   BUN 17 01/19/2013   CO2 26 01/19/2013   TSH 0.76 01/19/2013   HGBA1C 5.4 01/19/2013          Assessment & Plan:

## 2014-05-06 NOTE — Addendum Note (Signed)
Addended by: Cresenciano Lick on: 05/06/2014 03:29 PM   Modules accepted: Orders

## 2014-05-06 NOTE — Assessment & Plan Note (Signed)
Cont PO Labs

## 2014-05-06 NOTE — Assessment & Plan Note (Signed)
Continue with current Vit B12, D therapy as reflected on the Med list.

## 2014-05-06 NOTE — Assessment & Plan Note (Signed)
Start SQ inj

## 2014-05-06 NOTE — Progress Notes (Signed)
Pre visit review using our clinic review tool, if applicable. No additional management support is needed unless otherwise documented below in the visit note. 

## 2014-05-16 ENCOUNTER — Other Ambulatory Visit: Payer: Self-pay | Admitting: Internal Medicine

## 2014-05-16 MED ORDER — ERGOCALCIFEROL 1.25 MG (50000 UT) PO CAPS: 50000.0000 [IU] | ORAL_CAPSULE | ORAL | Status: DC

## 2014-07-01 ENCOUNTER — Other Ambulatory Visit: Payer: Self-pay

## 2014-07-01 MED ORDER — PHENTERMINE HCL 37.5 MG PO TABS: 37.5000 mg | ORAL_TABLET | Freq: Every day | ORAL | Status: DC

## 2014-07-01 NOTE — Telephone Encounter (Signed)
OK to fill this prescription with additional refills x2 Thank you!  

## 2014-07-01 NOTE — Telephone Encounter (Signed)
Rf phoned in.  

## 2014-07-01 NOTE — Telephone Encounter (Signed)
Received request for  phentermine (ADIPEX-P) 37.5 MG tablet 30 tablet 3 05/06/2014     Sig - Route: Take 1 tablet (37.5 mg total) by mouth daily before breakfast. - Oral   Patient last seen 05/06/14 and last filled 05/06/14 #30/3rf. Please advise

## 2015-06-08 ENCOUNTER — Telehealth: Payer: Self-pay | Admitting: *Deleted

## 2015-06-08 MED ORDER — TRIAMTERENE-HCTZ 37.5-25 MG PO TABS: 1.0000 | ORAL_TABLET | Freq: Every day | ORAL | Status: DC

## 2015-06-08 MED ORDER — POTASSIUM CHLORIDE CRYS ER 20 MEQ PO TBCR: 20.0000 meq | EXTENDED_RELEASE_TABLET | Freq: Two times a day (BID) | ORAL | Status: DC

## 2015-06-08 NOTE — Telephone Encounter (Signed)
Left msg on triage stating have made cpx appt for 4/18, but she is needing refills on her medications prior to appt. Sent 30 day to [facility] until appt....Johny Chess

## 2015-06-16 NOTE — Telephone Encounter (Signed)
Pt left another vm today stating she never received a call back re: below. I called [facility] to verify whether refills sent on 06/08/15 were received. Pharmacy confirmed receipt and state both rxs are ready for p/u. Left detailed mess informing pt.

## 2015-07-04 ENCOUNTER — Other Ambulatory Visit: Payer: Self-pay | Admitting: *Deleted

## 2015-07-04 ENCOUNTER — Encounter: Payer: 59 | Admitting: Internal Medicine

## 2015-07-04 NOTE — Telephone Encounter (Signed)
Pt had to R/s her CPE for today due to a death in her family. She is requesting a refill on her phentermine and Miralax. Her CPE is now scheduled for 07/11/15. Please advise.

## 2015-07-05 ENCOUNTER — Telehealth: Payer: Self-pay

## 2015-07-05 MED ORDER — POLYETHYLENE GLYCOL 3350 17 G PO PACK: 17.0000 g | PACK | Freq: Every day | ORAL | Status: DC

## 2015-07-05 MED ORDER — PHENTERMINE HCL 37.5 MG PO TABS: 37.5000 mg | ORAL_TABLET | Freq: Every day | ORAL | Status: DC

## 2015-07-05 NOTE — Telephone Encounter (Signed)
i have recd faxed rx request from [facility] , fax [phone] phentermine 37.5 mg tab----please advise, thanks

## 2015-07-05 NOTE — Telephone Encounter (Signed)
OK Phentermine ref x2 Miralax x5 Thx

## 2015-07-05 NOTE — Telephone Encounter (Signed)
Called refills into pharmacy spoke with mary/pharmacist gave md authorization for phentermine. Notified pt rx has been call to [facility].../lmb

## 2015-07-06 ENCOUNTER — Other Ambulatory Visit: Payer: Self-pay

## 2015-07-06 MED ORDER — PHENTERMINE HCL 37.5 MG PO TABS: 37.5000 mg | ORAL_TABLET | Freq: Every day | ORAL | Status: DC

## 2015-07-06 NOTE — Telephone Encounter (Signed)
Phentermine Rx faxed to [place] like this was done yesterday as well.

## 2015-07-06 NOTE — Telephone Encounter (Signed)
Printed, waiting for plot to sign 

## 2015-07-06 NOTE — Telephone Encounter (Signed)
OK to fill this prescription with additional refills x2 Thank you!  

## 2015-07-11 ENCOUNTER — Ambulatory Visit (INDEPENDENT_AMBULATORY_CARE_PROVIDER_SITE_OTHER): Payer: 59 | Admitting: Internal Medicine

## 2015-07-11 ENCOUNTER — Other Ambulatory Visit (INDEPENDENT_AMBULATORY_CARE_PROVIDER_SITE_OTHER): Payer: 59

## 2015-07-11 ENCOUNTER — Encounter: Payer: Self-pay | Admitting: Internal Medicine

## 2015-07-11 VITALS — BP 110/80 | HR 72 | Ht 64.0 in | Wt 338.0 lb

## 2015-07-11 DIAGNOSIS — Z0000 Encounter for general adult medical examination without abnormal findings: Secondary | ICD-10-CM

## 2015-07-11 DIAGNOSIS — Z0001 Encounter for general adult medical examination with abnormal findings: Secondary | ICD-10-CM

## 2015-07-11 DIAGNOSIS — E559 Vitamin D deficiency, unspecified: Secondary | ICD-10-CM

## 2015-07-11 DIAGNOSIS — E538 Deficiency of other specified B group vitamins: Secondary | ICD-10-CM | POA: Diagnosis not present

## 2015-07-11 LAB — CBC WITH DIFFERENTIAL/PLATELET
Basophils Absolute: 0 10*3/uL (ref 0.0–0.1)
Basophils Relative: 0.4 % (ref 0.0–3.0)
Eosinophils Absolute: 0.2 10*3/uL (ref 0.0–0.7)
Eosinophils Relative: 2.5 % (ref 0.0–5.0)
HCT: 40.1 % (ref 36.0–46.0)
Hemoglobin: 13.6 g/dL (ref 12.0–15.0)
Lymphocytes Relative: 27.1 % (ref 12.0–46.0)
Lymphs Abs: 2.7 10*3/uL (ref 0.7–4.0)
MCHC: 33.9 g/dL (ref 30.0–36.0)
MCV: 92.4 fl (ref 78.0–100.0)
Monocytes Absolute: 0.7 10*3/uL (ref 0.1–1.0)
Monocytes Relative: 6.9 % (ref 3.0–12.0)
Neutro Abs: 6.4 10*3/uL (ref 1.4–7.7)
Neutrophils Relative %: 63.1 % (ref 43.0–77.0)
Platelets: 321 10*3/uL (ref 150.0–400.0)
RBC: 4.34 Mil/uL (ref 3.87–5.11)
RDW: 13.2 % (ref 11.5–15.5)
WBC: 10.1 10*3/uL (ref 4.0–10.5)

## 2015-07-11 LAB — URINALYSIS
Bilirubin Urine: NEGATIVE
Hgb urine dipstick: NEGATIVE
Ketones, ur: NEGATIVE
Leukocytes, UA: NEGATIVE
Nitrite: NEGATIVE
Specific Gravity, Urine: 1.015 (ref 1.000–1.030)
Total Protein, Urine: NEGATIVE
Urine Glucose: NEGATIVE
Urobilinogen, UA: 1 (ref 0.0–1.0)
pH: 7.5 (ref 5.0–8.0)

## 2015-07-11 LAB — BASIC METABOLIC PANEL
BUN: 12 mg/dL (ref 6–23)
CO2: 26 mEq/L (ref 19–32)
Calcium: 9.4 mg/dL (ref 8.4–10.5)
Chloride: 104 mEq/L (ref 96–112)
Creatinine, Ser: 0.68 mg/dL (ref 0.40–1.20)
GFR: 124.48 mL/min (ref >60.00)
Glucose, Bld: 95 mg/dL (ref 70–99)
Potassium: 3.6 mEq/L (ref 3.5–5.1)
Sodium: 138 mEq/L (ref 135–145)

## 2015-07-11 LAB — LIPID PANEL
Cholesterol: 135 mg/dL (ref 0–200)
HDL: 37.8 mg/dL — ABNORMAL LOW (ref >39.00)
LDL Cholesterol: 76 mg/dL (ref 0–99)
NonHDL: 97.12
Total CHOL/HDL Ratio: 4
Triglycerides: 108 mg/dL (ref 0.0–149.0)
VLDL: 21.6 mg/dL (ref 0.0–40.0)

## 2015-07-11 LAB — HEPATIC FUNCTION PANEL
ALT: 11 U/L (ref 0–35)
AST: 13 U/L (ref 0–37)
Albumin: 3.9 g/dL (ref 3.5–5.2)
Alkaline Phosphatase: 37 U/L — ABNORMAL LOW (ref 39–117)
Bilirubin, Direct: 0.2 mg/dL (ref 0.0–0.3)
Total Bilirubin: 1.1 mg/dL (ref 0.2–1.2)
Total Protein: 7.1 g/dL (ref 6.0–8.3)

## 2015-07-11 LAB — TSH: TSH: 0.89 u[IU]/mL (ref 0.35–4.50)

## 2015-07-11 LAB — VITAMIN B12: Vitamin B-12: 157 pg/mL — ABNORMAL LOW (ref 211–911)

## 2015-07-11 LAB — VITAMIN D 25 HYDROXY (VIT D DEFICIENCY, FRACTURES): VITD: 6.15 ng/mL — ABNORMAL LOW (ref 30.00–100.00)

## 2015-07-11 MED ORDER — TRIAMTERENE-HCTZ 37.5-25 MG PO TABS: 1.0000 | ORAL_TABLET | Freq: Every day | ORAL | Status: DC

## 2015-07-11 MED ORDER — POTASSIUM CHLORIDE CRYS ER 20 MEQ PO TBCR: 20.0000 meq | EXTENDED_RELEASE_TABLET | Freq: Two times a day (BID) | ORAL | Status: DC

## 2015-07-11 MED ORDER — POLYETHYLENE GLYCOL 3350 17 GM/SCOOP PO POWD: 17.0000 g | Freq: Two times a day (BID) | ORAL | Status: DC | PRN

## 2015-07-11 MED ORDER — ERGOCALCIFEROL 1.25 MG (50000 UT) PO CAPS: 50000.0000 [IU] | ORAL_CAPSULE | ORAL | Status: DC

## 2015-07-11 NOTE — Assessment & Plan Note (Addendum)
Worse - re-start B12

## 2015-07-11 NOTE — Assessment & Plan Note (Signed)
Lap Band was discussed  Contrave, Belviq discussed

## 2015-07-11 NOTE — Progress Notes (Signed)
Pre visit review using our clinic review tool, if applicable. No additional management support is needed unless otherwise documented below in the visit note. 

## 2015-07-11 NOTE — Assessment & Plan Note (Signed)
We discussed age appropriate health related issues, including available/recomended screening tests and vaccinations. We discussed a need for adhering to healthy diet and exercise. Labs/EKG were reviewed/ordered. All questions were answered. GYN - Christophe Louis

## 2015-07-11 NOTE — Progress Notes (Signed)
Subjective:  Patient ID: Bridget [name], female    DOB: 09-May-1975  Age: 40 y.o. MRN: [id number]  CC: No chief complaint on file.   HPI Bridget [name] presents for a well exam  Outpatient Prescriptions Prior to Visit  Medication Sig Dispense Refill  . Cholecalciferol 2000 UNITS CAPS 1 po qd 100 each 3  . cyanocobalamin (COBAL-1000) 1000 MCG/ML injection Inject 1 mL (1,000 mcg total) into the muscle every 14 (fourteen) days. 10 mL 11  . ergocalciferol (VITAMIN D2) 50000 UNITS capsule Take 1 capsule (50,000 Units total) by mouth once a week. 8 capsule 0  . ibuprofen (ADVIL,MOTRIN) 200 MG tablet Take 400 mg by mouth every 6 (six) hours as needed for pain or headache.    . phentermine (ADIPEX-P) 37.5 MG tablet Take 1 tablet (37.5 mg total) by mouth daily before breakfast. 30 tablet 2  . polyethylene glycol (MIRALAX / GLYCOLAX) packet Take 17 g by mouth daily. 100 each 5  . potassium chloride SA (K-DUR,KLOR-CON) 20 MEQ tablet Take 1 tablet (20 mEq total) by mouth 2 (two) times daily. Must keep April 18th appt for future refills 60 tablet 0  . Syringe/Needle, Disp, (B-D ECLIPSE SYRINGE) 30G X 1/2" 1 ML MISC 1 each by Does not apply route 1 day or 1 dose. 50 each 3  . triamterene-hydrochlorothiazide (MAXZIDE-25) 37.5-25 MG tablet Take 1 tablet by mouth daily. Must keep April 18th appt for future refills 30 tablet 0   No facility-administered medications prior to visit.    ROS Review of Systems  Constitutional: Negative for chills, activity change, appetite change, fatigue and unexpected weight change.  HENT: Negative for congestion, mouth sores and sinus pressure.   Eyes: Negative for visual disturbance.  Respiratory: Negative for cough and chest tightness.   Gastrointestinal: Negative for nausea and abdominal pain.  Genitourinary: Negative for frequency, difficulty urinating and vaginal pain.  Musculoskeletal: Negative for back pain and gait problem.  Skin: Negative for pallor and  rash.  Neurological: Negative for dizziness, tremors, weakness, numbness and headaches.  Psychiatric/Behavioral: Negative for suicidal ideas, confusion and sleep disturbance. The patient is not nervous/anxious.     Objective:  There were no vitals taken for this visit.  BP Readings from Last 3 Encounters:  05/06/14 128/90  01/19/13 122/74  11/06/12 139/93    Wt Readings from Last 3 Encounters:  05/06/14 350 lb (158.759 kg)  01/19/13 330 lb 8 oz (149.914 kg)  12/13/11 337 lb (152.862 kg)    Physical Exam  Constitutional: She appears well-developed. No distress.  HENT:  Head: Normocephalic.  Right Ear: External ear normal.  Left Ear: External ear normal.  Nose: Nose normal.  Mouth/Throat: Oropharynx is clear and moist.  Eyes: Conjunctivae are normal. Pupils are equal, round, and reactive to light. Right eye exhibits no discharge. Left eye exhibits no discharge.  Neck: Normal range of motion. Neck supple. No JVD present. No tracheal deviation present. No thyromegaly present.  Cardiovascular: Normal rate, regular rhythm and normal heart sounds.   Pulmonary/Chest: No stridor. No respiratory distress. She has no wheezes.  Abdominal: Soft. Bowel sounds are normal. She exhibits no distension and no mass. There is no tenderness. There is no rebound and no guarding.  Musculoskeletal: She exhibits no edema or tenderness.  Lymphadenopathy:    She has no cervical adenopathy.  Neurological: She displays normal reflexes. No cranial nerve deficit. She exhibits normal muscle tone. Coordination normal.  Skin: No rash noted. No erythema.  Psychiatric: She has  a normal mood and affect. Her behavior is normal. Judgment and thought content normal.  obese   Lab Results  Component Value Date   WBC 8.2 05/06/2014   HGB 12.7 05/06/2014   HCT 37.3 05/06/2014   PLT 351.0 05/06/2014   GLUCOSE 81 05/06/2014   CHOL 129 05/06/2014   TRIG 119.0 05/06/2014   HDL 35.60* 05/06/2014   LDLCALC 70  05/06/2014   ALT 13 05/06/2014   AST 14 05/06/2014   NA 139 05/06/2014   K 3.6 05/06/2014   CL 107 05/06/2014   CREATININE 0.52 05/06/2014   BUN 10 05/06/2014   CO2 30 05/06/2014   TSH 0.88 05/06/2014   HGBA1C 5.3 05/06/2014    No results found.  Assessment & Plan:   There are no diagnoses linked to this encounter. I am having Ms. Welchel maintain her ibuprofen, Cholecalciferol, cyanocobalamin, Syringe/Needle (Disp), ergocalciferol, triamterene-hydrochlorothiazide, potassium chloride SA, polyethylene glycol, and phentermine.  No orders of the defined types were placed in this encounter.     Follow-up: No Follow-up on file.  Walker Kehr, MD

## 2015-07-11 NOTE — Assessment & Plan Note (Signed)
Worse Re-start Vit D Rx 

## 2015-07-17 ENCOUNTER — Ambulatory Visit (INDEPENDENT_AMBULATORY_CARE_PROVIDER_SITE_OTHER): Payer: 59

## 2015-07-17 ENCOUNTER — Telehealth: Payer: Self-pay

## 2015-07-17 DIAGNOSIS — E538 Deficiency of other specified B group vitamins: Secondary | ICD-10-CM

## 2015-07-17 MED ORDER — CYANOCOBALAMIN 1000 MCG/ML IJ SOLN
1000.0000 ug | Freq: Once | INTRAMUSCULAR | Status: AC
  Administered 2015-07-17: 1000 ug via INTRAMUSCULAR

## 2015-07-17 NOTE — Telephone Encounter (Signed)
Per dr Alain Marion, patient has been advised to come weekly x4 weeks, then monthly for b12 injections (for rest of her life)--

## 2015-07-24 ENCOUNTER — Ambulatory Visit: Payer: 59

## 2015-07-31 ENCOUNTER — Ambulatory Visit: Payer: 59

## 2015-08-07 ENCOUNTER — Other Ambulatory Visit (HOSPITAL_COMMUNITY): Payer: Self-pay | Admitting: Obstetrics and Gynecology

## 2015-08-07 DIAGNOSIS — O09299 Supervision of pregnancy with other poor reproductive or obstetric history, unspecified trimester: Secondary | ICD-10-CM

## 2015-08-08 ENCOUNTER — Ambulatory Visit: Payer: 59

## 2015-08-23 ENCOUNTER — Ambulatory Visit (HOSPITAL_COMMUNITY)
Admission: RE | Admit: 2015-08-23 | Discharge: 2015-08-23 | Disposition: A | Discharge status: Home / Self Care | Payer: 59 | Source: Ambulatory Visit | Attending: Obstetrics and Gynecology | Admitting: Obstetrics and Gynecology

## 2015-08-23 ENCOUNTER — Ambulatory Visit (HOSPITAL_COMMUNITY): Admission: RE | Admit: 2015-08-23 | Payer: 59 | Source: Ambulatory Visit

## 2015-08-23 DIAGNOSIS — Z36 Encounter for antenatal screening of mother: Secondary | ICD-10-CM | POA: Diagnosis not present

## 2015-08-23 DIAGNOSIS — O09299 Supervision of pregnancy with other poor reproductive or obstetric history, unspecified trimester: Secondary | ICD-10-CM

## 2015-08-23 DIAGNOSIS — O3411 Maternal care for benign tumor of corpus uteri, first trimester: Secondary | ICD-10-CM | POA: Insufficient documentation

## 2015-08-23 DIAGNOSIS — D252 Subserosal leiomyoma of uterus: Secondary | ICD-10-CM | POA: Insufficient documentation

## 2015-08-23 DIAGNOSIS — Z3A09 9 weeks gestation of pregnancy: Secondary | ICD-10-CM | POA: Insufficient documentation

## 2015-09-07 ENCOUNTER — Other Ambulatory Visit (HOSPITAL_COMMUNITY)
Admission: RE | Admit: 2015-09-07 | Discharge: 2015-09-07 | Disposition: A | Discharge status: Home / Self Care | Payer: 59 | Source: Ambulatory Visit | Attending: Obstetrics and Gynecology | Admitting: Obstetrics and Gynecology

## 2015-09-07 ENCOUNTER — Other Ambulatory Visit: Payer: Self-pay | Admitting: Obstetrics and Gynecology

## 2015-09-07 ENCOUNTER — Other Ambulatory Visit (HOSPITAL_COMMUNITY): Payer: Self-pay | Admitting: Obstetrics and Gynecology

## 2015-09-07 ENCOUNTER — Ambulatory Visit (HOSPITAL_COMMUNITY)
Admission: RE | Admit: 2015-09-07 | Discharge: 2015-09-07 | Disposition: A | Discharge status: Home / Self Care | Payer: 59 | Source: Ambulatory Visit | Attending: Obstetrics and Gynecology | Admitting: Obstetrics and Gynecology

## 2015-09-07 DIAGNOSIS — Z1151 Encounter for screening for human papillomavirus (HPV): Secondary | ICD-10-CM | POA: Insufficient documentation

## 2015-09-07 DIAGNOSIS — Z3A11 11 weeks gestation of pregnancy: Secondary | ICD-10-CM | POA: Diagnosis present

## 2015-09-07 DIAGNOSIS — Z01419 Encounter for gynecological examination (general) (routine) without abnormal findings: Secondary | ICD-10-CM | POA: Insufficient documentation

## 2015-09-07 DIAGNOSIS — Z36 Encounter for antenatal screening of mother: Secondary | ICD-10-CM | POA: Diagnosis present

## 2015-09-07 DIAGNOSIS — Z113 Encounter for screening for infections with a predominantly sexual mode of transmission: Secondary | ICD-10-CM | POA: Insufficient documentation

## 2015-09-07 DIAGNOSIS — O3411 Maternal care for benign tumor of corpus uteri, first trimester: Secondary | ICD-10-CM | POA: Diagnosis not present

## 2015-09-07 DIAGNOSIS — IMO0002 Reserved for concepts with insufficient information to code with codable children: Secondary | ICD-10-CM

## 2015-09-11 LAB — CYTOLOGY - PAP

## 2015-10-13 ENCOUNTER — Other Ambulatory Visit (HOSPITAL_COMMUNITY): Payer: Self-pay | Admitting: Obstetrics and Gynecology

## 2015-10-13 DIAGNOSIS — O09522 Supervision of elderly multigravida, second trimester: Secondary | ICD-10-CM

## 2015-10-13 DIAGNOSIS — Z3A18 18 weeks gestation of pregnancy: Secondary | ICD-10-CM

## 2015-10-13 DIAGNOSIS — Z3689 Encounter for other specified antenatal screening: Secondary | ICD-10-CM

## 2015-10-27 ENCOUNTER — Encounter (HOSPITAL_COMMUNITY): Payer: Self-pay | Admitting: Obstetrics and Gynecology

## 2015-11-02 ENCOUNTER — Encounter (HOSPITAL_COMMUNITY): Payer: Self-pay | Admitting: *Deleted

## 2015-11-02 ENCOUNTER — Ambulatory Visit (HOSPITAL_COMMUNITY)
Admission: RE | Admit: 2015-11-02 | Discharge: 2015-11-02 | Disposition: A | Discharge status: Home / Self Care | Payer: 59 | Source: Ambulatory Visit | Attending: Obstetrics and Gynecology | Admitting: Obstetrics and Gynecology

## 2015-11-02 DIAGNOSIS — Z3A18 18 weeks gestation of pregnancy: Secondary | ICD-10-CM | POA: Diagnosis not present

## 2015-11-02 DIAGNOSIS — Z36 Encounter for antenatal screening of mother: Secondary | ICD-10-CM | POA: Insufficient documentation

## 2015-11-02 DIAGNOSIS — O09522 Supervision of elderly multigravida, second trimester: Secondary | ICD-10-CM | POA: Diagnosis present

## 2015-11-02 DIAGNOSIS — Z3689 Encounter for other specified antenatal screening: Secondary | ICD-10-CM

## 2015-11-03 ENCOUNTER — Ambulatory Visit (HOSPITAL_COMMUNITY): Payer: 59

## 2015-11-07 ENCOUNTER — Other Ambulatory Visit (HOSPITAL_COMMUNITY): Payer: Self-pay

## 2015-11-09 ENCOUNTER — Encounter (HOSPITAL_COMMUNITY): Payer: Self-pay

## 2015-11-09 ENCOUNTER — Inpatient Hospital Stay (HOSPITAL_COMMUNITY)
Admission: AD | Admit: 2015-11-09 | Discharge: 2015-11-09 | Disposition: A | Discharge status: Home / Self Care | Payer: 59 | Source: Ambulatory Visit | Attending: Obstetrics and Gynecology | Admitting: Obstetrics and Gynecology

## 2015-11-09 ENCOUNTER — Inpatient Hospital Stay (HOSPITAL_COMMUNITY): Payer: 59

## 2015-11-09 DIAGNOSIS — O26839 Pregnancy related renal disease, unspecified trimester: Secondary | ICD-10-CM

## 2015-11-09 DIAGNOSIS — Z87442 Personal history of urinary calculi: Secondary | ICD-10-CM | POA: Insufficient documentation

## 2015-11-09 DIAGNOSIS — O99212 Obesity complicating pregnancy, second trimester: Secondary | ICD-10-CM | POA: Insufficient documentation

## 2015-11-09 DIAGNOSIS — R109 Unspecified abdominal pain: Secondary | ICD-10-CM | POA: Insufficient documentation

## 2015-11-09 DIAGNOSIS — O26892 Other specified pregnancy related conditions, second trimester: Secondary | ICD-10-CM | POA: Insufficient documentation

## 2015-11-09 DIAGNOSIS — Z3A20 20 weeks gestation of pregnancy: Secondary | ICD-10-CM | POA: Insufficient documentation

## 2015-11-09 DIAGNOSIS — R3129 Other microscopic hematuria: Secondary | ICD-10-CM | POA: Insufficient documentation

## 2015-11-09 DIAGNOSIS — O169 Unspecified maternal hypertension, unspecified trimester: Secondary | ICD-10-CM

## 2015-11-09 DIAGNOSIS — N200 Calculus of kidney: Secondary | ICD-10-CM

## 2015-11-09 LAB — COMPREHENSIVE METABOLIC PANEL
ALT: 20 U/L (ref 14–54)
AST: 27 U/L (ref 15–41)
Albumin: 3.1 g/dL — ABNORMAL LOW (ref 3.5–5.0)
Alkaline Phosphatase: 27 U/L — ABNORMAL LOW (ref 38–126)
Anion gap: 6 (ref 5–15)
BUN: 6 mg/dL (ref 6–20)
CO2: 21 mmol/L — ABNORMAL LOW (ref 22–32)
Calcium: 8.9 mg/dL (ref 8.9–10.3)
Chloride: 107 mmol/L (ref 101–111)
Creatinine, Ser: 0.51 mg/dL (ref 0.44–1.00)
GFR calc Af Amer: >60 mL/min (ref >60)
GFR calc non Af Amer: >60 mL/min (ref >60)
Glucose, Bld: 113 mg/dL — ABNORMAL HIGH (ref 65–99)
Potassium: 3.4 mmol/L — ABNORMAL LOW (ref 3.5–5.1)
Sodium: 134 mmol/L — ABNORMAL LOW (ref 135–145)
Total Bilirubin: 1 mg/dL (ref 0.3–1.2)
Total Protein: 6.1 g/dL — ABNORMAL LOW (ref 6.5–8.1)

## 2015-11-09 LAB — CBC WITH DIFFERENTIAL/PLATELET
Basophils Absolute: 0 10*3/uL (ref 0.0–0.1)
Basophils Relative: 0 %
Eosinophils Absolute: 0.1 10*3/uL (ref 0.0–0.7)
Eosinophils Relative: 1 %
HCT: 32.1 % — ABNORMAL LOW (ref 36.0–46.0)
Hemoglobin: 11.3 g/dL — ABNORMAL LOW (ref 12.0–15.0)
Lymphocytes Relative: 20 %
Lymphs Abs: 1.6 10*3/uL (ref 0.7–4.0)
MCH: 32 pg (ref 26.0–34.0)
MCHC: 35.2 g/dL (ref 30.0–36.0)
MCV: 90.9 fL (ref 78.0–100.0)
Monocytes Absolute: 0.5 10*3/uL (ref 0.1–1.0)
Monocytes Relative: 6 %
Neutro Abs: 5.8 10*3/uL (ref 1.7–7.7)
Neutrophils Relative %: 73 %
Platelets: 252 10*3/uL (ref 150–400)
RBC: 3.53 MIL/uL — ABNORMAL LOW (ref 3.87–5.11)
RDW: 13.2 % (ref 11.5–15.5)
WBC: 8 10*3/uL (ref 4.0–10.5)

## 2015-11-09 LAB — URINALYSIS, ROUTINE W REFLEX MICROSCOPIC
Bilirubin Urine: NEGATIVE
Glucose, UA: NEGATIVE mg/dL
Ketones, ur: 15 mg/dL — AB
Leukocytes, UA: NEGATIVE
Nitrite: NEGATIVE
Protein, ur: NEGATIVE mg/dL
Specific Gravity, Urine: >1.03 — ABNORMAL HIGH (ref 1.005–1.030)
pH: 6 (ref 5.0–8.0)

## 2015-11-09 LAB — PROTEIN / CREATININE RATIO, URINE
Creatinine, Urine: 266 mg/dL
Protein Creatinine Ratio: 0.09 mg/mg{Cre} (ref 0.00–0.15)
Total Protein, Urine: 24 mg/dL

## 2015-11-09 LAB — URINE MICROSCOPIC-ADD ON: WBC, UA: NONE SEEN WBC/hpf (ref 0–5)

## 2015-11-09 LAB — LIPASE, BLOOD: Lipase: 16 U/L (ref 11–51)

## 2015-11-09 MED ORDER — OXYCODONE-ACETAMINOPHEN 5-325 MG PO TABS: 1.0000 | ORAL_TABLET | ORAL | 0 refills | Status: DC | PRN

## 2015-11-09 MED ORDER — TAMSULOSIN HCL 0.4 MG PO CAPS: 0.4000 mg | ORAL_CAPSULE | Freq: Every day | ORAL | 0 refills | Status: DC

## 2015-11-09 MED ORDER — TAMSULOSIN HCL 0.4 MG PO CAPS
0.4000 mg | ORAL_CAPSULE | Freq: Once | ORAL | Status: DC
  Filled 2015-11-09: qty 1

## 2015-11-09 MED ORDER — OXYCODONE-ACETAMINOPHEN 5-325 MG PO TABS
1.0000 | ORAL_TABLET | Freq: Once | ORAL | Status: AC
  Administered 2015-11-09: 1 via ORAL
  Filled 2015-11-09: qty 1

## 2015-11-09 NOTE — MAU Note (Signed)
Pt presents to MAU with complaints of pain in her left lower side. PT states she had an appointment with Dr Landry Mellow today and called and she told her to come here and be evaluated instead of coming to the office

## 2015-11-09 NOTE — Discharge Instructions (Signed)
Hypertension During Pregnancy Hypertension, or high blood pressure, is when there is extra pressure inside your blood vessels that carry blood from the heart to the rest of your body (arteries). It can happen at any time in life, including pregnancy. Hypertension during pregnancy can cause problems for you and your baby. Your baby might not weigh as much as he or she should at birth or might be born early (premature). Very bad cases of hypertension during pregnancy can be life-threatening.  Different types of hypertension can occur during pregnancy. These include:  Chronic hypertension. This happens when a woman has hypertension before pregnancy and it continues during pregnancy.  Gestational hypertension. This is when hypertension develops during pregnancy.  Preeclampsia or toxemia of pregnancy. This is a very serious type of hypertension that develops only during pregnancy. It affects the whole body and can be very dangerous for both mother and baby.  Gestational hypertension and preeclampsia usually go away after your baby is born. Your blood pressure will likely stabilize within 6 weeks. Women who have hypertension during pregnancy have a greater chance of developing hypertension later in life or with future pregnancies. RISK FACTORS There are certain factors that make it more likely for you to develop hypertension during pregnancy. These include:  Having hypertension before pregnancy.  Having hypertension during a previous pregnancy.  Being overweight.  Being older than 40 years.  Being pregnant with more than one baby.  Having diabetes or kidney problems. SIGNS AND SYMPTOMS Chronic and gestational hypertension rarely cause symptoms. Preeclampsia has symptoms, which may include:  Increased protein in your urine. Your health care provider will check for this at every prenatal visit.  Swelling of your hands and face.  Rapid weight gain.  Headaches.  Visual changes.  Being  bothered by light.  Abdominal pain, especially in the upper right area.  Chest pain.  Shortness of breath.  Increased reflexes.  Seizures. These occur with a more severe form of preeclampsia, called eclampsia. DIAGNOSIS  You may be diagnosed with hypertension during a regular prenatal exam. At each prenatal visit, you may have:  Your blood pressure checked.  A urine test to check for protein in your urine. The type of hypertension you are diagnosed with depends on when you developed it. It also depends on your specific blood pressure reading.  Developing hypertension before 20 weeks of pregnancy is consistent with chronic hypertension.  Developing hypertension after 20 weeks of pregnancy is consistent with gestational hypertension.  Hypertension with increased urinary protein is diagnosed as preeclampsia.  Blood pressure measurements that stay above 834 systolic or 196 diastolic are a sign of severe preeclampsia. TREATMENT Treatment for hypertension during pregnancy varies. Treatment depends on the type of hypertension and how serious it is.  If you take medicine for chronic hypertension, you may need to switch medicines.  Medicines called ACE inhibitors should not be taken during pregnancy.  Low-dose aspirin may be suggested for women who have risk factors for preeclampsia.  If you have gestational hypertension, you may need to take a blood pressure medicine that is safe during pregnancy. Your health care provider will recommend the correct medicine.  If you have severe preeclampsia, you may need to be in the hospital. Health care providers will watch you and your baby very closely. You also may need to take medicine called magnesium sulfate to prevent seizures and lower blood pressure.  Sometimes, an early delivery is needed. This may be the case if the condition worsens. It would be  done to protect you and your baby. The only cure for preeclampsia is delivery.  Your health  care provider may recommend that you take one low-dose aspirin (81 mg) each day to help prevent high blood pressure during your pregnancy if you are at risk for preeclampsia. You may be at risk for preeclampsia if:  You had preeclampsia or eclampsia during a previous pregnancy.  Your baby did not grow as expected during a previous pregnancy.  You experienced preterm birth with a previous pregnancy.  You experienced a separation of the placenta from the uterus (placental abruption) during a previous pregnancy.  You experienced the loss of your baby during a previous pregnancy.  You are pregnant with more than one baby.  You have other medical conditions, such as diabetes or an autoimmune disease. HOME CARE INSTRUCTIONS  Schedule and keep all of your regular prenatal care appointments. This is important.  Take medicines only as directed by your health care provider. Tell your health care provider about all medicines you take.  Eat as little salt as possible.  Get regular exercise.  Do not drink alcohol.  Do not use tobacco products.  Do not drink products with caffeine.  Lie on your left side when resting. SEEK IMMEDIATE MEDICAL CARE IF:  You have severe abdominal pain.  You have sudden swelling in your hands, ankles, or face.  You gain 4 pounds (1.8 kg) or more in 1 week.  You vomit repeatedly.  You have vaginal bleeding.  You do not feel your baby moving as much.  You have a headache.  You have blurred or double vision.  You have muscle twitching or spasms.  You have shortness of breath.  You have blue fingernails or lips.  You have blood in your urine. MAKE SURE YOU:  Understand these instructions.  Will watch your condition.  Will get help right away if you are not doing well or get worse.   This information is not intended to replace advice given to you by your health care provider. Make sure you discuss any questions you have with your health care  provider.   Document Released: 11/20/2010 Document Revised: 03/25/2014 Document Reviewed: 10/01/2012 Elsevier Interactive Patient Education 2016 [place]. Kidney Stones Kidney stones (urolithiasis) are deposits that form inside your kidneys. The intense pain is caused by the stone moving through the urinary tract. When the stone moves, the ureter goes into spasm around the stone. The stone is usually passed in the urine.  CAUSES   A disorder that makes certain neck glands produce too much parathyroid hormone (primary hyperparathyroidism).  A buildup of uric acid crystals, similar to gout in your joints.  Narrowing (stricture) of the ureter.  A kidney obstruction present at birth (congenital obstruction).  Previous surgery on the kidney or ureters.  Numerous kidney infections. SYMPTOMS   Feeling sick to your stomach (nauseous).  Throwing up (vomiting).  Blood in the urine (hematuria).  Pain that usually spreads (radiates) to the groin.  Frequency or urgency of urination. DIAGNOSIS   Taking a history and physical exam.  Blood or urine tests.  CT scan.  Occasionally, an examination of the inside of the urinary bladder (cystoscopy) is performed. TREATMENT   Observation.  Increasing your fluid intake.  Extracorporeal shock wave lithotripsy--This is a noninvasive procedure that uses shock waves to break up kidney stones.  Surgery may be needed if you have severe pain or persistent obstruction. There are various surgical procedures. Most of the procedures are  performed with the use of small instruments. Only small incisions are needed to accommodate these instruments, so recovery time is minimized. The size, location, and chemical composition are all important variables that will determine the proper choice of action for you. Talk to your health care provider to better understand your situation so that you will minimize the risk of injury to yourself and your kidney.    HOME CARE INSTRUCTIONS   Drink enough water and fluids to keep your urine clear or pale yellow. This will help you to pass the stone or stone fragments.  Strain all urine through the provided strainer. Keep all particulate matter and stones for your health care provider to see. The stone causing the pain may be as small as a grain of salt. It is very important to use the strainer each and every time you pass your urine. The collection of your stone will allow your health care provider to analyze it and verify that a stone has actually passed. The stone analysis will often identify what you can do to reduce the incidence of recurrences.  Only take over-the-counter or prescription medicines for pain, discomfort, or fever as directed by your health care provider.  Keep all follow-up visits as told by your health care provider. This is important.  Get follow-up X-rays if required. The absence of pain does not always mean that the stone has passed. It may have only stopped moving. If the urine remains completely obstructed, it can cause loss of kidney function or even complete destruction of the kidney. It is your responsibility to make sure X-rays and follow-ups are completed. Ultrasounds of the kidney can show blockages and the status of the kidney. Ultrasounds are not associated with any radiation and can be performed easily in a matter of minutes.  Make changes to your daily diet as told by your health care provider. You may be told to:  Limit the amount of salt that you eat.  Eat 5 or more servings of fruits and vegetables each day.  Limit the amount of meat, poultry, fish, and eggs that you eat.  Collect a 24-hour urine sample as told by your health care provider.You may need to collect another urine sample every 6-12 months. SEEK MEDICAL CARE IF:  You experience pain that is progressive and unresponsive to any pain medicine you have been prescribed. SEEK IMMEDIATE MEDICAL CARE IF:    Pain cannot be controlled with the prescribed medicine.  You have a fever or shaking chills.  The severity or intensity of pain increases over 18 hours and is not relieved by pain medicine.  You develop a new onset of abdominal pain.  You feel faint or pass out.  You are unable to urinate.   This information is not intended to replace advice given to you by your health care provider. Make sure you discuss any questions you have with your health care provider.   Document Released: 03/04/2005 Document Revised: 11/23/2014 Document Reviewed: 08/05/2012 Elsevier Interactive Patient Education [employer].

## 2015-11-09 NOTE — MAU Provider Note (Signed)
MAU HISTORY AND PHYSICAL  Chief Complaint:  Abdominal Pain   Bridget [name] is a 40 y.o.  [id number]  at [redacted]w[redacted]d presenting for Abdominal Pain  Pain. Began last night. Had this type of pain in past with kidney stone. Pain is in low back. alsomild pain left lower quadrant. Constant pain. Sharp. NO dysuria. No hematuria. No fevers/chills. No n/v. No constipation or diarrhea. Not associated with eating. Ate today, does have appetite. No vaginal bleeding or lof. No recent medication changes. No vaginal discharge or vaginal pain. No history pyelonephritis. One uti as an adult, this pregnancy. No current urinary frequency or urgency.  Says bp elevated at recent u/s. No ha, vision change, or sob. No ruq pain. Denies hx htn   Past Medical History:  Diagnosis Date  . Constipation   . Edema   . Headache(784.0)   . Morbid obesity ([place])     Past Surgical History:  Procedure Laterality Date  . BREAST REDUCTION SURGERY      Family History  Problem Relation Age of Onset  . Deep vein thrombosis Father   . Hypertension Other     Social History  Substance Use Topics  . Smoking status: Never Smoker  . Smokeless tobacco: Never Used  . Alcohol use Yes     Comment: occasionally    No Known Allergies  Prescriptions Prior to Admission  Medication Sig Dispense Refill Last Dose  . Cholecalciferol 2000 UNITS CAPS 1 po qd (Patient not taking: Reported on 11/02/2015) 100 each 3 Not Taking  . cyanocobalamin (COBAL-1000) 1000 MCG/ML injection Inject 1 mL (1,000 mcg total) into the muscle every 14 (fourteen) days. (Patient not taking: Reported on 11/02/2015) 10 mL 11 Not Taking  . ergocalciferol (VITAMIN D2) 50000 units capsule Take 1 capsule (50,000 Units total) by mouth once a week. (Patient not taking: Reported on 11/02/2015) 8 capsule 0 Not Taking  . ibuprofen (ADVIL,MOTRIN) 200 MG tablet Take 400 mg by mouth every 6 (six) hours as needed for pain or headache.   Not Taking  . phentermine (ADIPEX-P) 37.5  MG tablet Take 1 tablet (37.5 mg total) by mouth daily before breakfast. (Patient not taking: Reported on 11/02/2015) 30 tablet 2 Not Taking  . polyethylene glycol powder (GLYCOLAX/MIRALAX) powder Take 17 g by mouth 2 (two) times daily as needed for moderate constipation. (Patient not taking: Reported on 11/02/2015) 500 g 11 Not Taking  . potassium chloride SA (K-DUR,KLOR-CON) 20 MEQ tablet Take 1 tablet (20 mEq total) by mouth 2 (two) times daily. (Patient not taking: Reported on 11/02/2015) 60 tablet 11 Not Taking  . Prenatal Vit-Fe Fumarate-FA (PRENATAL VITAMIN PO) Take by mouth.   Taking  . Syringe/Needle, Disp, (B-D ECLIPSE SYRINGE) 30G X 1/2" 1 ML MISC 1 each by Does not apply route 1 day or 1 dose. (Patient not taking: Reported on 11/02/2015) 50 each 3 Not Taking  . triamterene-hydrochlorothiazide (MAXZIDE-25) 37.5-25 MG tablet Take 1 tablet by mouth daily. (Patient not taking: Reported on 11/02/2015) 30 tablet 11 Not Taking    Review of Systems - Negative except for what is mentioned in HPI.  Physical Exam  Blood pressure 108/88, pulse 100, temperature 98 F (36.7 C), temperature source Oral, resp. rate 18, height 5\' 5"  (1.651 m), weight (!) 338 lb (153.3 kg), last menstrual period 06/18/2015. GENERAL: Well-developed, well-nourished female in no acute distress.  LUNGS: Clear to auscultation bilaterally.  HEART: Regular rate and rhythm. ABDOMEN: Soft, mild ttp llq, nondistended. No cva tenderness EXTREMITIES: Nontender, no  edema, 2+ distal pulses. FHT: 150s   Labs: Results for orders placed or performed during the hospital encounter of 11/09/15 (from the past 24 hour(s))  Urinalysis, Routine w reflex microscopic (not at [facility])   Collection Time: 11/09/15  1:06 PM  Result Value Ref Range   Color, Urine AMBER (A) YELLOW   APPearance HAZY (A) CLEAR   Specific Gravity, Urine >1.030 (H) 1.005 - 1.030   pH 6.0 5.0 - 8.0   Glucose, UA NEGATIVE NEGATIVE mg/dL   Hgb urine dipstick LARGE (A)  NEGATIVE   Bilirubin Urine NEGATIVE NEGATIVE   Ketones, ur 15 (A) NEGATIVE mg/dL   Protein, ur NEGATIVE NEGATIVE mg/dL   Nitrite NEGATIVE NEGATIVE   Leukocytes, UA NEGATIVE NEGATIVE  Protein / creatinine ratio, urine   Collection Time: 11/09/15  1:06 PM  Result Value Ref Range   Creatinine, Urine 266.00 mg/dL   Total Protein, Urine 24 mg/dL   Protein Creatinine Ratio 0.09 0.00 - 0.15 mg/mg[Cre]  Urine microscopic-add on   Collection Time: 11/09/15  1:06 PM  Result Value Ref Range   Squamous Epithelial / LPF 0-5 (A) NONE SEEN   WBC, UA NONE SEEN 0 - 5 WBC/hpf   RBC / HPF TOO NUMEROUS TO COUNT 0 - 5 RBC/hpf   Bacteria, UA MANY (A) NONE SEEN   Urine-Other TALC CRYSTALS PRESENT   CBC with Differential/Platelet   Collection Time: 11/09/15  1:36 PM  Result Value Ref Range   WBC 8.0 4.0 - 10.5 K/uL   RBC 3.53 (L) 3.87 - 5.11 MIL/uL   Hemoglobin 11.3 (L) 12.0 - 15.0 g/dL   HCT 32.1 (L) 36.0 - 46.0 %   MCV 90.9 78.0 - 100.0 fL   MCH 32.0 26.0 - 34.0 pg   MCHC 35.2 30.0 - 36.0 g/dL   RDW 13.2 11.5 - 15.5 %   Platelets 252 150 - 400 K/uL   Neutrophils Relative % 73 %   Neutro Abs 5.8 1.7 - 7.7 K/uL   Lymphocytes Relative 20 %   Lymphs Abs 1.6 0.7 - 4.0 K/uL   Monocytes Relative 6 %   Monocytes Absolute 0.5 0.1 - 1.0 K/uL   Eosinophils Relative 1 %   Eosinophils Absolute 0.1 0.0 - 0.7 K/uL   Basophils Relative 0 %   Basophils Absolute 0.0 0.0 - 0.1 K/uL  Comprehensive metabolic panel   Collection Time: 11/09/15  1:36 PM  Result Value Ref Range   Sodium 134 (L) 135 - 145 mmol/L   Potassium 3.4 (L) 3.5 - 5.1 mmol/L   Chloride 107 101 - 111 mmol/L   CO2 21 (L) 22 - 32 mmol/L   Glucose, Bld 113 (H) 65 - 99 mg/dL   BUN 6 6 - 20 mg/dL   Creatinine, Ser 0.51 0.44 - 1.00 mg/dL   Calcium 8.9 8.9 - 10.3 mg/dL   Total Protein 6.1 (L) 6.5 - 8.1 g/dL   Albumin 3.1 (L) 3.5 - 5.0 g/dL   AST 27 15 - 41 U/L   ALT 20 14 - 54 U/L   Alkaline Phosphatase 27 (L) 38 - 126 U/L   Total Bilirubin  1.0 0.3 - 1.2 mg/dL   GFR calc non Af Amer >60 >60 mL/min   GFR calc Af Amer >60 >60 mL/min   Anion gap 6 5 - 15  Lipase, blood   Collection Time: 11/09/15  1:36 PM  Result Value Ref Range   Lipase 16 11 - 51 U/L    Imaging Studies:  US Renal  Result Date: 11/09/2015 CLINICAL DATA:  Left flank pain.  Hematuria EXAM: RENAL / URINARY TRACT ULTRASOUND COMPLETE COMPARISON:  None. FINDINGS: Right Kidney: Length: 11.3 cm There is a cyst within the midpole measuring 9 x 8 x 7 mm. Echogenicity within normal limits. No mass or hydronephrosis visualized. Left Kidney: Length: 11.7 cm. Echogenicity within normal limits. No mass or hydronephrosis visualized. Left-sided pelvocaliectasis is likely related to pregnancy. Bladder: Appears normal for degree of bladder distention. IMPRESSION: 1. No obstructive uropathy.  No kidney stones identified. 2. Right kidney cyst. 3. Left-sided pelvocaliectasis which is likely related to pregnancy. Electronically Signed   By: Kerby Moors M.D.   On: 11/09/2015 15:39   [place] Mfm Ob Detail +14 Wk  Result Date: 11/02/2015 OBSTETRICAL ULTRASOUND: This exam was performed within a Campus Ultrasound Department. The OB US report was generated in the AS system, and faxed to the ordering physician.  This report is available in the [employer]. See the AS Obstetric US report via the Image Link.   Assessment: Bridget [name] is  40 y.o. [phone] at [redacted]w[redacted]d presents with left flank pain and llq pain. Likely 2/2 nephrolithiasis: has hx nephrolithiasis, pain is consistent w/ this dx, microscopic hematuria present. No obstruction seen on u/s. No s/s pyelonephritis or uti. Less likely other intraabdominal pathology given well appearnace, normal vitals, normal labs, good appetite. FHTs wnl, no vaginal bleeding. Discussed w/ dr. Landry Mellow. Of note, initial BPs mildly elevated, and does have hx mildly elevated BP on chart review. Subsequent BPs wnl, and preE labs negative  Plan: - hydration -  percocet for pain - tamsulosin to facilitate stone passage, risks/benefits discussed w/ patient and she opts to start - ob f/u next week - abdominal pain return precautions - notify primary ob of elevated BP  Desma Maxim 8/24/20174:04 PM

## 2015-11-10 LAB — CULTURE, OB URINE: Culture: <10000 — AB

## 2015-12-13 ENCOUNTER — Other Ambulatory Visit: Payer: Self-pay | Admitting: Obstetrics and Gynecology

## 2015-12-13 DIAGNOSIS — E669 Obesity, unspecified: Secondary | ICD-10-CM

## 2015-12-13 DIAGNOSIS — O09522 Supervision of elderly multigravida, second trimester: Secondary | ICD-10-CM

## 2015-12-20 ENCOUNTER — Ambulatory Visit (HOSPITAL_COMMUNITY)
Admission: RE | Admit: 2015-12-20 | Discharge: 2015-12-20 | Disposition: A | Discharge status: Home / Self Care | Payer: 59 | Source: Ambulatory Visit | Attending: Obstetrics and Gynecology | Admitting: Obstetrics and Gynecology

## 2015-12-20 ENCOUNTER — Encounter (HOSPITAL_COMMUNITY): Payer: Self-pay

## 2015-12-20 ENCOUNTER — Other Ambulatory Visit (HOSPITAL_COMMUNITY): Payer: Self-pay | Admitting: *Deleted

## 2015-12-20 DIAGNOSIS — E669 Obesity, unspecified: Secondary | ICD-10-CM

## 2015-12-20 DIAGNOSIS — O09529 Supervision of elderly multigravida, unspecified trimester: Secondary | ICD-10-CM

## 2015-12-20 DIAGNOSIS — Z362 Encounter for other antenatal screening follow-up: Secondary | ICD-10-CM | POA: Insufficient documentation

## 2015-12-20 DIAGNOSIS — O99212 Obesity complicating pregnancy, second trimester: Secondary | ICD-10-CM | POA: Diagnosis not present

## 2015-12-20 DIAGNOSIS — Z3A20 20 weeks gestation of pregnancy: Secondary | ICD-10-CM | POA: Diagnosis not present

## 2015-12-20 DIAGNOSIS — O09522 Supervision of elderly multigravida, second trimester: Secondary | ICD-10-CM

## 2016-01-17 ENCOUNTER — Encounter (HOSPITAL_COMMUNITY): Payer: Self-pay | Admitting: Obstetrics and Gynecology

## 2016-01-31 ENCOUNTER — Ambulatory Visit (HOSPITAL_COMMUNITY)
Admission: RE | Admit: 2016-01-31 | Discharge: 2016-01-31 | Disposition: A | Discharge status: Home / Self Care | Payer: 59 | Source: Ambulatory Visit | Attending: Obstetrics and Gynecology | Admitting: Obstetrics and Gynecology

## 2016-02-01 ENCOUNTER — Other Ambulatory Visit (HOSPITAL_COMMUNITY): Payer: Self-pay | Admitting: Obstetrics and Gynecology

## 2016-02-01 ENCOUNTER — Ambulatory Visit (HOSPITAL_COMMUNITY)
Admission: RE | Admit: 2016-02-01 | Discharge: 2016-02-01 | Disposition: A | Discharge status: Home / Self Care | Payer: 59 | Source: Ambulatory Visit | Attending: Obstetrics and Gynecology | Admitting: Obstetrics and Gynecology

## 2016-02-01 ENCOUNTER — Encounter (HOSPITAL_COMMUNITY): Payer: Self-pay

## 2016-02-01 DIAGNOSIS — O09523 Supervision of elderly multigravida, third trimester: Secondary | ICD-10-CM

## 2016-02-01 DIAGNOSIS — Z3A32 32 weeks gestation of pregnancy: Secondary | ICD-10-CM

## 2016-02-01 DIAGNOSIS — O09529 Supervision of elderly multigravida, unspecified trimester: Secondary | ICD-10-CM

## 2016-02-02 ENCOUNTER — Other Ambulatory Visit (HOSPITAL_COMMUNITY): Payer: Self-pay | Admitting: *Deleted

## 2016-02-02 DIAGNOSIS — O3660X0 Maternal care for excessive fetal growth, unspecified trimester, not applicable or unspecified: Secondary | ICD-10-CM

## 2016-02-29 ENCOUNTER — Other Ambulatory Visit (HOSPITAL_COMMUNITY): Payer: Self-pay | Admitting: Obstetrics and Gynecology

## 2016-02-29 ENCOUNTER — Encounter (HOSPITAL_COMMUNITY): Payer: Self-pay

## 2016-02-29 ENCOUNTER — Ambulatory Visit (HOSPITAL_COMMUNITY)
Admission: RE | Admit: 2016-02-29 | Discharge: 2016-02-29 | Disposition: A | Discharge status: Home / Self Care | Payer: 59 | Source: Ambulatory Visit | Attending: Obstetrics and Gynecology | Admitting: Obstetrics and Gynecology

## 2016-02-29 DIAGNOSIS — O09523 Supervision of elderly multigravida, third trimester: Secondary | ICD-10-CM

## 2016-02-29 DIAGNOSIS — Z3A36 36 weeks gestation of pregnancy: Secondary | ICD-10-CM | POA: Diagnosis not present

## 2016-02-29 DIAGNOSIS — O99213 Obesity complicating pregnancy, third trimester: Secondary | ICD-10-CM

## 2016-02-29 DIAGNOSIS — O3660X0 Maternal care for excessive fetal growth, unspecified trimester, not applicable or unspecified: Secondary | ICD-10-CM

## 2016-03-07 ENCOUNTER — Telehealth (HOSPITAL_COMMUNITY): Payer: Self-pay | Admitting: *Deleted

## 2016-03-07 ENCOUNTER — Encounter (HOSPITAL_COMMUNITY): Payer: Self-pay | Admitting: *Deleted

## 2016-03-07 NOTE — Telephone Encounter (Signed)
Preadmission screen  

## 2016-03-19 ENCOUNTER — Other Ambulatory Visit: Payer: Self-pay | Admitting: Obstetrics and Gynecology

## 2016-03-20 ENCOUNTER — Encounter (HOSPITAL_COMMUNITY): Payer: Self-pay

## 2016-03-20 ENCOUNTER — Inpatient Hospital Stay (HOSPITAL_COMMUNITY): Payer: 59 | Admitting: Anesthesiology

## 2016-03-20 ENCOUNTER — Inpatient Hospital Stay (HOSPITAL_COMMUNITY): Payer: 59

## 2016-03-20 ENCOUNTER — Inpatient Hospital Stay (HOSPITAL_COMMUNITY)
Admission: RE | Admit: 2016-03-20 | Discharge: 2016-03-23 | DRG: 765 | Disposition: A | Discharge status: Home / Self Care | Payer: 59 | Source: Ambulatory Visit | Attending: Obstetrics and Gynecology | Admitting: Obstetrics and Gynecology

## 2016-03-20 ENCOUNTER — Other Ambulatory Visit: Payer: Self-pay | Admitting: Obstetrics and Gynecology

## 2016-03-20 ENCOUNTER — Encounter (HOSPITAL_COMMUNITY)
Admission: RE | Disposition: A | Discharge status: Home / Self Care | Payer: Self-pay | Source: Ambulatory Visit | Attending: Obstetrics and Gynecology

## 2016-03-20 VITALS — BP 135/85 | HR 78 | Temp 98.4°F | Resp 18 | Ht 65.0 in | Wt 354.0 lb

## 2016-03-20 DIAGNOSIS — O329XX1 Maternal care for malpresentation of fetus, unspecified, fetus 1: Secondary | ICD-10-CM

## 2016-03-20 DIAGNOSIS — Z8249 Family history of ischemic heart disease and other diseases of the circulatory system: Secondary | ICD-10-CM | POA: Diagnosis not present

## 2016-03-20 DIAGNOSIS — O99824 Streptococcus B carrier state complicating childbirth: Secondary | ICD-10-CM | POA: Diagnosis present

## 2016-03-20 DIAGNOSIS — Z6841 Body Mass Index (BMI) 40.0 and over, adult: Secondary | ICD-10-CM | POA: Diagnosis not present

## 2016-03-20 DIAGNOSIS — O321XX0 Maternal care for breech presentation, not applicable or unspecified: Secondary | ICD-10-CM | POA: Diagnosis present

## 2016-03-20 DIAGNOSIS — Z3490 Encounter for supervision of normal pregnancy, unspecified, unspecified trimester: Secondary | ICD-10-CM

## 2016-03-20 DIAGNOSIS — O99214 Obesity complicating childbirth: Secondary | ICD-10-CM | POA: Diagnosis present

## 2016-03-20 DIAGNOSIS — K219 Gastro-esophageal reflux disease without esophagitis: Secondary | ICD-10-CM | POA: Diagnosis present

## 2016-03-20 DIAGNOSIS — Z3A39 39 weeks gestation of pregnancy: Secondary | ICD-10-CM

## 2016-03-20 DIAGNOSIS — O9962 Diseases of the digestive system complicating childbirth: Secondary | ICD-10-CM | POA: Diagnosis present

## 2016-03-20 DIAGNOSIS — O3663X0 Maternal care for excessive fetal growth, third trimester, not applicable or unspecified: Principal | ICD-10-CM | POA: Diagnosis present

## 2016-03-20 DIAGNOSIS — Z3493 Encounter for supervision of normal pregnancy, unspecified, third trimester: Secondary | ICD-10-CM

## 2016-03-20 DIAGNOSIS — Z833 Family history of diabetes mellitus: Secondary | ICD-10-CM

## 2016-03-20 DIAGNOSIS — Z302 Encounter for sterilization: Secondary | ICD-10-CM | POA: Diagnosis not present

## 2016-03-20 HISTORY — PX: TUBAL LIGATION: SHX77

## 2016-03-20 LAB — CBC
HCT: 33 % — ABNORMAL LOW (ref 36.0–46.0)
Hemoglobin: 11.5 g/dL — ABNORMAL LOW (ref 12.0–15.0)
MCH: 31.4 pg (ref 26.0–34.0)
MCHC: 34.8 g/dL (ref 30.0–36.0)
MCV: 90.2 fL (ref 78.0–100.0)
Platelets: 271 10*3/uL (ref 150–400)
RBC: 3.66 MIL/uL — ABNORMAL LOW (ref 3.87–5.11)
RDW: 13.4 % (ref 11.5–15.5)
WBC: 6.6 10*3/uL (ref 4.0–10.5)

## 2016-03-20 LAB — TYPE AND SCREEN
ABO/RH(D): A POS
Antibody Screen: NEGATIVE

## 2016-03-20 LAB — RPR: RPR Ser Ql: NONREACTIVE

## 2016-03-20 SURGERY — LIGATION, FALLOPIAN TUBE, BILATERAL
Anesthesia: Spinal | Wound class: Clean Contaminated

## 2016-03-20 MED ORDER — MORPHINE SULFATE-NACL 0.5-0.9 MG/ML-% IV SOSY
PREFILLED_SYRINGE | INTRAVENOUS | Status: AC
  Filled 2016-03-20: qty 1

## 2016-03-20 MED ORDER — OXYTOCIN 10 UNIT/ML IJ SOLN
INTRAMUSCULAR | Status: AC
  Filled 2016-03-20: qty 4

## 2016-03-20 MED ORDER — BUPIVACAINE IN DEXTROSE 0.75-8.25 % IT SOLN
INTRATHECAL | Status: DC | PRN
  Administered 2016-03-20: 1.5 mL via INTRATHECAL

## 2016-03-20 MED ORDER — MENTHOL 3 MG MT LOZG: 1.0000 | LOZENGE | OROMUCOSAL | Status: DC | PRN

## 2016-03-20 MED ORDER — OXYTOCIN 40 UNITS IN LACTATED RINGERS INFUSION - SIMPLE MED: 1.0000 m[IU]/min | INTRAVENOUS | Status: DC

## 2016-03-20 MED ORDER — DEXAMETHASONE SODIUM PHOSPHATE 10 MG/ML IJ SOLN
INTRAMUSCULAR | Status: DC | PRN
  Administered 2016-03-20: 4 mg via INTRAVENOUS

## 2016-03-20 MED ORDER — PHENYLEPHRINE 8 MG IN D5W 100 ML (0.08MG/ML) PREMIX OPTIME
INJECTION | INTRAVENOUS | Status: AC
  Filled 2016-03-20: qty 100

## 2016-03-20 MED ORDER — METHYLERGONOVINE MALEATE 0.2 MG/ML IJ SOLN: 0.2000 mg | INTRAMUSCULAR | Status: DC | PRN

## 2016-03-20 MED ORDER — FENTANYL CITRATE (PF) 100 MCG/2ML IJ SOLN
INTRAMUSCULAR | Status: AC
  Filled 2016-03-20: qty 2

## 2016-03-20 MED ORDER — SIMETHICONE 80 MG PO CHEW: 80.0000 mg | CHEWABLE_TABLET | ORAL | Status: DC | PRN

## 2016-03-20 MED ORDER — FERROUS SULFATE 325 (65 FE) MG PO TABS
325.0000 mg | ORAL_TABLET | Freq: Two times a day (BID) | ORAL | Status: DC
  Administered 2016-03-21 – 2016-03-23 (×5): 325 mg via ORAL
  Filled 2016-03-20 (×5): qty 1

## 2016-03-20 MED ORDER — LACTATED RINGERS IV SOLN
INTRAVENOUS | Status: DC
  Administered 2016-03-20 – 2016-03-21 (×2): via INTRAVENOUS

## 2016-03-20 MED ORDER — OXYTOCIN BOLUS FROM INFUSION: 500.0000 mL | Freq: Once | INTRAVENOUS | Status: DC

## 2016-03-20 MED ORDER — PHENYLEPHRINE 8 MG IN D5W 100 ML (0.08MG/ML) PREMIX OPTIME
INJECTION | INTRAVENOUS | Status: DC | PRN
  Administered 2016-03-20: 60 ug/min via INTRAVENOUS

## 2016-03-20 MED ORDER — LACTATED RINGERS IV SOLN
INTRAVENOUS | Status: DC | PRN
  Administered 2016-03-20: 14:00:00 via INTRAVENOUS

## 2016-03-20 MED ORDER — LACTATED RINGERS IV SOLN: 500.0000 mL | INTRAVENOUS | Status: DC | PRN

## 2016-03-20 MED ORDER — TERBUTALINE SULFATE 1 MG/ML IJ SOLN: 0.2500 mg | Freq: Once | INTRAMUSCULAR | Status: DC | PRN

## 2016-03-20 MED ORDER — WITCH HAZEL-GLYCERIN EX PADS: 1.0000 "application " | MEDICATED_PAD | CUTANEOUS | Status: DC | PRN

## 2016-03-20 MED ORDER — LIDOCAINE HCL (PF) 1 % IJ SOLN: 30.0000 mL | INTRAMUSCULAR | Status: DC | PRN

## 2016-03-20 MED ORDER — OXYCODONE-ACETAMINOPHEN 5-325 MG PO TABS: 1.0000 | ORAL_TABLET | ORAL | Status: DC | PRN

## 2016-03-20 MED ORDER — BUTORPHANOL TARTRATE 1 MG/ML IJ SOLN: 1.0000 mg | INTRAMUSCULAR | Status: DC | PRN

## 2016-03-20 MED ORDER — OXYCODONE-ACETAMINOPHEN 5-325 MG PO TABS: 2.0000 | ORAL_TABLET | ORAL | Status: DC | PRN

## 2016-03-20 MED ORDER — SENNOSIDES-DOCUSATE SODIUM 8.6-50 MG PO TABS
2.0000 | ORAL_TABLET | ORAL | Status: DC
  Administered 2016-03-21 – 2016-03-22 (×3): 2 via ORAL
  Filled 2016-03-20 (×3): qty 2

## 2016-03-20 MED ORDER — FENTANYL CITRATE (PF) 100 MCG/2ML IJ SOLN
INTRAMUSCULAR | Status: DC | PRN
  Administered 2016-03-20: 10 ug via INTRATHECAL
  Administered 2016-03-20: 90 ug via INTRAVENOUS

## 2016-03-20 MED ORDER — ZOLPIDEM TARTRATE 5 MG PO TABS: 5.0000 mg | ORAL_TABLET | Freq: Every evening | ORAL | Status: DC | PRN

## 2016-03-20 MED ORDER — METHYLERGONOVINE MALEATE 0.2 MG PO TABS: 0.2000 mg | ORAL_TABLET | ORAL | Status: DC | PRN

## 2016-03-20 MED ORDER — NALOXONE HCL 0.4 MG/ML IJ SOLN: 0.4000 mg | INTRAMUSCULAR | Status: DC | PRN

## 2016-03-20 MED ORDER — KETOROLAC TROMETHAMINE 30 MG/ML IJ SOLN
30.0000 mg | Freq: Four times a day (QID) | INTRAMUSCULAR | Status: AC | PRN
  Administered 2016-03-20: 30 mg via INTRAMUSCULAR

## 2016-03-20 MED ORDER — SODIUM CHLORIDE 0.9% FLUSH: 3.0000 mL | INTRAVENOUS | Status: DC | PRN

## 2016-03-20 MED ORDER — MORPHINE SULFATE (PF) 0.5 MG/ML IJ SOLN
INTRAMUSCULAR | Status: DC | PRN
  Administered 2016-03-20: .2 mg via INTRATHECAL

## 2016-03-20 MED ORDER — NALBUPHINE HCL 10 MG/ML IJ SOLN: 5.0000 mg | Freq: Once | INTRAMUSCULAR | Status: DC | PRN

## 2016-03-20 MED ORDER — OXYCODONE HCL 5 MG PO TABS: 10.0000 mg | ORAL_TABLET | ORAL | Status: DC | PRN

## 2016-03-20 MED ORDER — METOCLOPRAMIDE HCL 5 MG/ML IJ SOLN
INTRAMUSCULAR | Status: DC | PRN
  Administered 2016-03-20: 10 mg via INTRAVENOUS

## 2016-03-20 MED ORDER — SIMETHICONE 80 MG PO CHEW
80.0000 mg | CHEWABLE_TABLET | ORAL | Status: DC
  Administered 2016-03-21 – 2016-03-22 (×3): 80 mg via ORAL
  Filled 2016-03-20 (×4): qty 1

## 2016-03-20 MED ORDER — ONDANSETRON HCL 4 MG/2ML IJ SOLN
INTRAMUSCULAR | Status: AC
  Filled 2016-03-20: qty 2

## 2016-03-20 MED ORDER — DIPHENHYDRAMINE HCL 25 MG PO CAPS: 25.0000 mg | ORAL_CAPSULE | ORAL | Status: DC | PRN

## 2016-03-20 MED ORDER — DEXTROSE 5 % IV SOLN: 1.0000 ug/kg/h | INTRAVENOUS | Status: DC | PRN

## 2016-03-20 MED ORDER — CEFAZOLIN SODIUM 1 G IJ SOLR
3.0000 g | Freq: Once | INTRAMUSCULAR | Status: DC
  Administered 2016-03-20: 3 g via INTRAMUSCULAR
  Filled 2016-03-20 (×2): qty 30

## 2016-03-20 MED ORDER — OXYTOCIN 40 UNITS IN LACTATED RINGERS INFUSION - SIMPLE MED: 2.5000 [IU]/h | INTRAVENOUS | Status: AC

## 2016-03-20 MED ORDER — PENICILLIN G POT IN DEXTROSE 60000 UNIT/ML IV SOLN
3.0000 10*6.[IU] | INTRAVENOUS | Status: DC
  Filled 2016-03-20: qty 50

## 2016-03-20 MED ORDER — MEPERIDINE HCL 25 MG/ML IJ SOLN: 6.2500 mg | INTRAMUSCULAR | Status: DC | PRN

## 2016-03-20 MED ORDER — ONDANSETRON HCL 4 MG/2ML IJ SOLN
INTRAMUSCULAR | Status: DC | PRN
  Administered 2016-03-20: 4 mg via INTRAVENOUS

## 2016-03-20 MED ORDER — DIPHENHYDRAMINE HCL 50 MG/ML IJ SOLN: 12.5000 mg | INTRAMUSCULAR | Status: DC | PRN

## 2016-03-20 MED ORDER — ERYTHROMYCIN 5 MG/GM OP OINT
TOPICAL_OINTMENT | OPHTHALMIC | Status: AC
  Filled 2016-03-20: qty 1

## 2016-03-20 MED ORDER — DIPHENHYDRAMINE HCL 25 MG PO CAPS: 25.0000 mg | ORAL_CAPSULE | Freq: Four times a day (QID) | ORAL | Status: DC | PRN

## 2016-03-20 MED ORDER — ACETAMINOPHEN 325 MG PO TABS: 650.0000 mg | ORAL_TABLET | ORAL | Status: DC | PRN

## 2016-03-20 MED ORDER — PRENATAL MULTIVITAMIN CH
1.0000 | ORAL_TABLET | Freq: Every day | ORAL | Status: DC
  Filled 2016-03-20 (×2): qty 1

## 2016-03-20 MED ORDER — OXYTOCIN 40 UNITS IN LACTATED RINGERS INFUSION - SIMPLE MED: 2.5000 [IU]/h | INTRAVENOUS | Status: DC

## 2016-03-20 MED ORDER — COCONUT OIL OIL: 1.0000 "application " | TOPICAL_OIL | Status: DC | PRN

## 2016-03-20 MED ORDER — NALBUPHINE HCL 10 MG/ML IJ SOLN: 5.0000 mg | INTRAMUSCULAR | Status: DC | PRN

## 2016-03-20 MED ORDER — DEXTROSE 5 % IV SOLN
3.0000 g | Freq: Once | INTRAVENOUS | Status: DC
  Filled 2016-03-20: qty 3000

## 2016-03-20 MED ORDER — DEXTROSE 5 % IV SOLN
5.0000 10*6.[IU] | Freq: Once | INTRAVENOUS | Status: AC
  Administered 2016-03-20: 5 10*6.[IU] via INTRAVENOUS
  Filled 2016-03-20: qty 5

## 2016-03-20 MED ORDER — KETOROLAC TROMETHAMINE 30 MG/ML IJ SOLN
INTRAMUSCULAR | Status: AC
  Filled 2016-03-20: qty 1

## 2016-03-20 MED ORDER — SCOPOLAMINE 1 MG/3DAYS TD PT72: 1.0000 | MEDICATED_PATCH | Freq: Once | TRANSDERMAL | Status: DC

## 2016-03-20 MED ORDER — ONDANSETRON HCL 4 MG/2ML IJ SOLN: 4.0000 mg | Freq: Four times a day (QID) | INTRAMUSCULAR | Status: DC | PRN

## 2016-03-20 MED ORDER — LACTATED RINGERS IV SOLN
INTRAVENOUS | Status: DC | PRN
  Administered 2016-03-20 (×3): via INTRAVENOUS

## 2016-03-20 MED ORDER — ACETAMINOPHEN 325 MG PO TABS
650.0000 mg | ORAL_TABLET | ORAL | Status: DC | PRN
  Administered 2016-03-21 – 2016-03-23 (×5): 650 mg via ORAL
  Filled 2016-03-20 (×5): qty 2

## 2016-03-20 MED ORDER — IBUPROFEN 600 MG PO TABS
600.0000 mg | ORAL_TABLET | Freq: Four times a day (QID) | ORAL | Status: DC
  Administered 2016-03-21 – 2016-03-23 (×11): 600 mg via ORAL
  Filled 2016-03-20 (×11): qty 1

## 2016-03-20 MED ORDER — LACTATED RINGERS IV SOLN
INTRAVENOUS | Status: DC
  Administered 2016-03-20: 125 mL/h via INTRAVENOUS

## 2016-03-20 MED ORDER — ONDANSETRON HCL 4 MG/2ML IJ SOLN
4.0000 mg | Freq: Three times a day (TID) | INTRAMUSCULAR | Status: DC | PRN
  Administered 2016-03-20: 4 mg via INTRAVENOUS
  Filled 2016-03-20: qty 2

## 2016-03-20 MED ORDER — OXYCODONE HCL 5 MG PO TABS
5.0000 mg | ORAL_TABLET | ORAL | Status: DC | PRN
  Administered 2016-03-22: 5 mg via ORAL
  Filled 2016-03-20: qty 1

## 2016-03-20 MED ORDER — SOD CITRATE-CITRIC ACID 500-334 MG/5ML PO SOLN
30.0000 mL | ORAL | Status: DC | PRN
  Administered 2016-03-20: 30 mL via ORAL
  Filled 2016-03-20 (×2): qty 15

## 2016-03-20 MED ORDER — KETOROLAC TROMETHAMINE 30 MG/ML IJ SOLN: 30.0000 mg | Freq: Four times a day (QID) | INTRAMUSCULAR | Status: AC | PRN

## 2016-03-20 MED ORDER — DIBUCAINE 1 % RE OINT: 1.0000 "application " | TOPICAL_OINTMENT | RECTAL | Status: DC | PRN

## 2016-03-20 MED ORDER — SIMETHICONE 80 MG PO CHEW
80.0000 mg | CHEWABLE_TABLET | Freq: Three times a day (TID) | ORAL | Status: DC
  Administered 2016-03-21 – 2016-03-23 (×7): 80 mg via ORAL
  Filled 2016-03-20 (×7): qty 1

## 2016-03-20 MED ORDER — OXYTOCIN 10 UNIT/ML IJ SOLN
INTRAVENOUS | Status: DC | PRN
  Administered 2016-03-20: 40 [IU] via INTRAVENOUS

## 2016-03-20 SURGICAL SUPPLY — 44 items
APL SKNCLS STERI-STRIP NONHPOA (GAUZE/BANDAGES/DRESSINGS) ×2
BARRIER ADHS 3X4 INTERCEED (GAUZE/BANDAGES/DRESSINGS) ×1 IMPLANT
BENZOIN TINCTURE PRP APPL 2/3 (GAUZE/BANDAGES/DRESSINGS) ×3 IMPLANT
BRR ADH 4X3 ABS CNTRL BYND (GAUZE/BANDAGES/DRESSINGS) ×2
CHLORAPREP W/TINT 26ML (MISCELLANEOUS) ×3 IMPLANT
CLAMP CORD UMBIL (MISCELLANEOUS) IMPLANT
CLOSURE STERI STRIP 1/2 X4 (GAUZE/BANDAGES/DRESSINGS) ×1 IMPLANT
CLOTH BEACON ORANGE TIMEOUT ST (SAFETY) ×3 IMPLANT
CONTAINER PREFILL 10% NBF 15ML (MISCELLANEOUS) IMPLANT
DRESSING DISP NPWT PICO 4X12 (MISCELLANEOUS) ×1 IMPLANT
DRSG OPSITE POSTOP 4X10 (GAUZE/BANDAGES/DRESSINGS) ×3 IMPLANT
ELECT REM PT RETURN 9FT ADLT (ELECTROSURGICAL) ×3
ELECTRODE REM PT RTRN 9FT ADLT (ELECTROSURGICAL) ×2 IMPLANT
EXTRACTOR VACUUM KIWI (MISCELLANEOUS) IMPLANT
GLOVE BIOGEL M 6.5 STRL (GLOVE) ×6 IMPLANT
GLOVE BIOGEL PI IND STRL 6.5 (GLOVE) ×2 IMPLANT
GLOVE BIOGEL PI IND STRL 7.0 (GLOVE) ×2 IMPLANT
GLOVE BIOGEL PI INDICATOR 6.5 (GLOVE) ×1
GLOVE BIOGEL PI INDICATOR 7.0 (GLOVE) ×1
GOWN STRL REUS W/TWL LRG LVL3 (GOWN DISPOSABLE) ×9 IMPLANT
KIT ABG SYR 3ML LUER SLIP (SYRINGE) IMPLANT
NDL HYPO 25X5/8 SAFETYGLIDE (NEEDLE) IMPLANT
NEEDLE HYPO 25X5/8 SAFETYGLIDE (NEEDLE) IMPLANT
NS IRRIG 1000ML POUR BTL (IV SOLUTION) ×3 IMPLANT
PACK C SECTION WH (CUSTOM PROCEDURE TRAY) ×3 IMPLANT
PAD OB MATERNITY 4.3X12.25 (PERSONAL CARE ITEMS) ×3 IMPLANT
PENCIL SMOKE EVAC W/HOLSTER (ELECTROSURGICAL) ×3 IMPLANT
RETRACTOR TRAXI PANNICULUS (MISCELLANEOUS) IMPLANT
RTRCTR C-SECT PINK 25CM LRG (MISCELLANEOUS) IMPLANT
SPONGE LAP 18X18 X RAY DECT (DISPOSABLE) ×1 IMPLANT
STRIP CLOSURE SKIN 1/2X4 (GAUZE/BANDAGES/DRESSINGS) ×3 IMPLANT
SUT PDS AB 0 CT1 27 (SUTURE) ×6 IMPLANT
SUT PLAIN 0 NONE (SUTURE) ×2 IMPLANT
SUT PLAIN 2 0 XLH (SUTURE) ×1 IMPLANT
SUT VIC AB 0 CTX 36 (SUTURE) ×9
SUT VIC AB 0 CTX36XBRD ANBCTRL (SUTURE) ×6 IMPLANT
SUT VIC AB 2-0 CT1 27 (SUTURE) ×3
SUT VIC AB 2-0 CT1 TAPERPNT 27 (SUTURE) ×2 IMPLANT
SUT VIC AB 3-0 SH 27 (SUTURE)
SUT VIC AB 3-0 SH 27X BRD (SUTURE) IMPLANT
SUT VIC AB 4-0 KS 27 (SUTURE) ×4 IMPLANT
TOWEL OR 17X24 6PK STRL BLUE (TOWEL DISPOSABLE) ×3 IMPLANT
TRAXI PANNICULUS RETRACTOR (MISCELLANEOUS) ×1
TRAY FOLEY CATH SILVER 14FR (SET/KITS/TRAYS/PACK) ×3 IMPLANT

## 2016-03-20 NOTE — Lactation Note (Signed)
This note was copied from a baby's chart. Lactation Consultation Note  Patient Name: Bridget [name] [id number] Date: 03/20/2016 Reason for consult: Initial assessment;Breast surgery (Breast Reduction 10-11 years ago)   Initial consult with Exp BF mom of 1 hour old infant in PACU. Mom reports she had a breast reduction 10-11 years ago with complete removal of nipple and long scar across chest underneath both breasts. Mom reports she BF her 41 yo for 3-6 weeks, she reports her milk was delayed in coming in and infant was supplemented in the hospital and had a prolonged stay due to decreased output. Mom reports she continued formula after her milk came in and stopped BF due to nipple pain. Mom reports she has nipple sensation and nipple pain started early in her BF experience. Mom is concerned for this infant getting enough and mom and I discussed waiting and watching output for supplementation or of mom feels differently then formula can be introduced earlier based on mom's history. Mom did report slight increase in breast size with this pregnancy.   Infant STS with mom, attempted to latch infant to right breast without success. Infant licked a little and then fell asleep. Mom with large compressible breasts with semi flat nipples that evert with stimulation. Unable to express colostrum at this time. Attempted to awaken infant for feeding and she was not interested in feeding. Left STS with mom.   Enc mom to feed infant STS 8-12 x in 24 hours at first feeding cues. Enc mom to hand express prior to latch. Enc mom to use lots of pillow support with feeding. Enc mom to call out to desk for feeding assistance as needed. Discussed with mom beginning pumping by tomorrow morning to stimulate milk supply.   BF Resources Handout and [place] Brochure given, mom informed of IP/OP Services, BF Support Groups and [place] phone #. Mom was referred to Breast feeding after Breast Surgeries Web site. Mom has [place]  her to call and ask if they provide breast pumps. Mom voiced understanding. Update to Carolann Littler, RN.    Maternal Data Formula Feeding for Exclusion: No Has patient been taught Hand Expression?: Yes Does the patient have breastfeeding experience prior to this delivery?: Yes  Feeding Feeding Type: Breast Fed Length of feed: 0 min  LATCH Score/Interventions Latch: Too sleepy or reluctant, no latch achieved, no sucking elicited. Intervention(s): Skin to skin;Teach feeding cues;Waking techniques  Audible Swallowing: None Intervention(s): Hand expression;Skin to skin  Type of Nipple: Everted at rest and after stimulation  Comfort (Breast/Nipple): Soft / non-tender     Hold (Positioning): Full assist, staff holds infant at breast Intervention(s): Breastfeeding basics reviewed;Support Pillows;Position options;Skin to skin  LATCH Score: 4  Lactation Tools Discussed/Used WIC Program: No   Consult Status Consult Status: Follow-up Date: 03/20/16 Follow-up type: In-patient    Debby Freiberg Azlynn Mitnick 03/20/2016, 3:18 PM

## 2016-03-20 NOTE — Progress Notes (Signed)
Pt up to bathroom.

## 2016-03-20 NOTE — Progress Notes (Signed)
Ultrasound per [place] dept done

## 2016-03-20 NOTE — Anesthesia Procedure Notes (Signed)
Spinal  Staffing Anesthesiologist: Suzette Battiest Performed: anesthesiologist  Preanesthetic Checklist Completed: patient identified, site marked, surgical consent, pre-op evaluation, timeout performed, IV checked, risks and benefits discussed and monitors and equipment checked Spinal Block Patient position: sitting Prep: site prepped and draped and DuraPrep Patient monitoring: blood pressure, continuous pulse ox and heart rate Approach: midline Location: L4-5 Injection technique: single-shot Needle Needle type: Pencan and Tuohy  Needle gauge: 25 G Needle length: 12.7 cm Needle insertion depth: 11 cm Assessment Sensory level: T6 Additional Notes CSE performed in OR with 17g Touhy and 25g Pencan. LOR to air at 8cm. Easy pass of pencan through touhy with return of CSF. Aspiration before and after injection LA. Pencan removed and easy thread of epidural catheter. Placed at 13cm at skin initally. Advanced to 17cm when placed in lateral decubitus position. Catheter secured and pt laid flat. T6 sensory levels bilaterally to pinprick prior to prep and drape.

## 2016-03-20 NOTE — Anesthesia Preprocedure Evaluation (Signed)
Anesthesia Evaluation  Patient identified by MRN, date of birth, ID band Patient awake    Reviewed: Allergy & Precautions, NPO status , Patient's Chart, lab work & pertinent test results  Airway Mallampati: II  TM Distance: >3 FB Neck ROM: Full    Dental   Pulmonary neg pulmonary ROS,    breath sounds clear to auscultation       Cardiovascular negative cardio ROS   Rhythm:Regular Rate:Normal     Neuro/Psych negative neurological ROS     GI/Hepatic Neg liver ROS, GERD  ,  Endo/Other  Morbid obesity  Renal/GU negative Renal ROS     Musculoskeletal   Abdominal   Peds  Hematology negative hematology ROS (+)   Anesthesia Other Findings   Reproductive/Obstetrics (+) Pregnancy                             Lab Results  Component Value Date   WBC 6.6 03/20/2016   HGB 11.5 (L) 03/20/2016   HCT 33.0 (L) 03/20/2016   MCV 90.2 03/20/2016   PLT 271 03/20/2016    Anesthesia Physical Anesthesia Plan  ASA: III  Anesthesia Plan: Combined Spinal and Epidural   Post-op Pain Management:    Induction:   Airway Management Planned: Natural Airway  Additional Equipment:   Intra-op Plan:   Post-operative Plan:   Informed Consent: I have reviewed the patients History and Physical, chart, labs and discussed the procedure including the risks, benefits and alternatives for the proposed anesthesia with the patient or authorized representative who has indicated his/her understanding and acceptance.     Plan Discussed with: CRNA  Anesthesia Plan Comments:         Anesthesia Quick Evaluation

## 2016-03-20 NOTE — Transfer of Care (Signed)
Immediate Anesthesia Transfer of Care Note  Patient: Bridget [name]  Procedure(s) Performed: Procedure(s): CESAREAN SECTION (N/A) BILATERAL TUBAL LIGATION (Bilateral)  Patient Location: PACU  Anesthesia Type:Spinal  Level of Consciousness: awake, alert  and oriented  Airway & Oxygen Therapy: Patient Spontanous Breathing  Post-op Assessment: Report given to RN and Post -op Vital signs reviewed and stable  Post vital signs: Reviewed and stable  Last Vitals:  Vitals:   03/20/16 0908 03/20/16 1203  BP: (!) 145/70 136/87  Pulse: 84 99  Resp: 18 16  Temp:  36.8 C    Last Pain:  Vitals:   03/20/16 1203  TempSrc: Oral         Complications: No apparent anesthesia complications

## 2016-03-20 NOTE — Anesthesia Procedure Notes (Signed)
Epidural Patient location during procedure: OB  Staffing Anesthesiologist: Suzette Battiest Performed: anesthesiologist   Preanesthetic Checklist Completed: patient identified, site marked, surgical consent, pre-op evaluation, timeout performed, IV checked, risks and benefits discussed and monitors and equipment checked  Epidural Patient position: sitting Prep: site prepped and draped and DuraPrep Patient monitoring: continuous pulse ox and blood pressure Approach: midline Location: L4-L5 Injection technique: LOR air  Needle:  Needle type: Tuohy  Needle gauge: 17 G Needle length: 9 cm and 9 Needle insertion depth: 8 cm Catheter type: closed end flexible Catheter size: 19 Gauge Catheter at skin depth: 17 cm Test dose: Other (No test dose given. Catheter threaded after spinal dose given via CSE.)  Assessment Events: blood not aspirated, injection not painful, no injection resistance, negative IV test and no paresthesia

## 2016-03-20 NOTE — Op Note (Signed)
Cesarean Section  And Bilateral Tubal ligation Procedure Note  Indications: malpresentation: tranverse presentation   Pre-operative Diagnosis: 39 week 3 day pregnancy.  Post-operative Diagnosis: same  Surgeon: Catha Brow.   Assistants: Dr. Thurnell Lose   Anesthesia: Spinal anesthesia  ASA Class: 2   Procedure Details   The patient was seen in the Holding Room. The risks, benefits, complications, treatment options, and expected outcomes were discussed with the patient.  The patient concurred with the proposed plan, giving informed consent.  The site of surgery properly noted/marked. The patient was taken to Operating Room # 9, identified as Bridget [name] and the procedure verified as C-Section Delivery. A Time Out was held and the above information confirmed.  After induction of anesthesia, the patient was draped and prepped in the usual sterile manner. A Pfannenstiel incision was made and carried down through the subcutaneous tissue to the fascia. Fascial incision was made and extended transversely. The fascia was separated from the underlying rectus tissue superiorly and inferiorly. The peritoneum was identified and entered. Peritoneal incision was extended longitudinally. The utero-vesical peritoneal reflection was incised transversely and the bladder flap was bluntly freed from the lower uterine segment. A low transverse uterine incision was made. Delivered from breech  presentation was a   Female with Apgar scores of 8 at one minute and 9 at five minutes. After the umbilical cord was clamped and cut cord blood was obtained for evaluation. The placenta was removed intact and appeared normal. The uterine outline, tubes and ovaries appeared normal. The uterine incision was closed with running locked sutures of 0 vicryl . Attention was turned to the left fallopian tube. The left tube was grasped with a babcock clamp. The Mesosalpinx was incised with the Bovie. A free tie of Plain Gut was  placed medially and laterally. The intervening segment was excised and sent to pathology . This was repeated on the Right fallopian tube.  Hemostasis was observed. Lavage was carried out until clear.The peritoneum was re approximated with 2-0 vicryl.  The fascia was then re approximated with running sutures of 0 pds.. The skin was re approximated with 4-0 vicryl . PICO negative pressure dressing was placed   Instrument, sponge, and needle counts were correct prior the abdominal closure and at the conclusion of the case.   Findings: Female infant in the frank breech presentation   Estimated Blood Loss:  700 mL         Drains: None         Total IV Fluids:  3000 ml         Specimens: Fallopian Tube and Disposition:  Sent to Pathology Placenta sent to labor and delivery          Implants: None         Complications:  None; patient tolerated the procedure well.         Disposition: PACU - hemodynamically stable.         Condition: stable  Attending Attestation: I performed the procedure.

## 2016-03-20 NOTE — Progress Notes (Signed)
Difficult to maintain continuous tracing due to maternal habitus

## 2016-03-20 NOTE — Progress Notes (Signed)
GBS culture obtained on 03/05/16 unable to find results in prenatal or Epic labs

## 2016-03-20 NOTE — H&P (Signed)
Bridget [name] is a 41 y.o. female  At 12 wks and 3 day with EDD 03/24/2016 presenting for induction of labor due to term pregnancy and large for gestational age fetus.On arrival fetus is no longer vertex but is transverse presentation.   OB History    Gravida Para Term Preterm AB Living   7 1 1   5 1    SAB TAB Ectopic Multiple Live Births   3 2     1      Past Medical History:  Diagnosis Date  . Constipation   . Edema   . Headache(784.0)   . Morbid obesity ([place])    Past Surgical History:  Procedure Laterality Date  . BREAST REDUCTION SURGERY    . BREAST SURGERY  2005   reduction   Family History: family history includes Deep vein thrombosis in her father; Diabetes in her father, paternal aunt, and paternal grandmother; Heart disease in her maternal grandfather and maternal grandmother; Hypertension in her mother and other. Social History:  reports that she has never smoked. She has never used smokeless tobacco. She reports that she drinks alcohol. She reports that she does not use drugs.     Maternal Diabetes: No Genetic Screening: Normal Maternal Ultrasounds/Referrals: Normal Fetal Ultrasounds or other Referrals:  None Maternal Substance Abuse:  No Significant Maternal Medications:  None Significant Maternal Lab Results:  Lab values include: Group B Strep positive Other Comments:  None  Review of Systems  Constitutional: Negative.   HENT: Negative.   Eyes: Negative.   Respiratory: Negative.   Cardiovascular: Positive for leg swelling.  Gastrointestinal: Negative.   Genitourinary: Negative.   Musculoskeletal: Positive for myalgias.  Skin: Negative.   Neurological: Negative.   Endo/Heme/Allergies: Negative.   Psychiatric/Behavioral: Negative.    Maternal Medical History:  Fetal activity: Perceived fetal activity is normal.      Dilation: 2 Effacement (%): 70 Station: Ballotable Exam by:: s grindstaff rn  Blood pressure 136/87, pulse 99, temperature 98.3 F  (36.8 C), temperature source Oral, resp. rate 16, height 5\' 5"  (1.651 m), weight (!) 160.6 kg (354 lb), last menstrual period 06/24/2015. Exam Physical Exam  Vitals reviewed. Constitutional: She is oriented to person, place, and time. She appears well-developed and well-nourished.  HENT:  Head: Normocephalic and atraumatic.  Eyes: Pupils are equal, round, and reactive to light.  Neck: Normal range of motion.  Cardiovascular: Normal rate and regular rhythm.   Respiratory: Effort normal and breath sounds normal.  GI: She exhibits no distension.  Genitourinary: Vagina normal.  Musculoskeletal: Normal range of motion. She exhibits edema.  Neurological: She is alert and oriented to person, place, and time.  Skin: Skin is warm and dry.  Psychiatric: She has a normal mood and affect.    Prenatal labs: ABO, Rh: --/--/A POS (01/03 [id number]) Antibody: NEG (01/03 0810) Rubella:  Immune  RPR:   Nonreactive  HBsAg:   Negative  HIV:   Nonreactive  GBS:   Positive   Assessment/Plan: 39 wks and 3 days admitted for induction however fetus is no longer vertex. By Ultrasound malpresentation transverse position. Recommend cesarean section. Pt also desires tubal ligation. R/B/A of surgery discussed with patient including but not limited to infection / bleeding damage to bowel bladder baby with need for further surgery. R/o transfusion discussed. R/O tubal ligation discussed. Pt understands that this is considered a permanent form of sterilization. She voiced understanding and desires to proceed.   Bridget Borromeo J. 03/20/2016, 12:19 PM

## 2016-03-20 NOTE — Lactation Note (Signed)
This note was copied from a baby's chart. Lactation Consultation Note  Patient Name: Girl Glorene Leazenby [id number] Date: 03/20/2016 Reason for consult: Follow-up assessment Baby at 7 hr of life. Baby was sleeping upon entry. Mom is worried that she is not making milk and the baby is hungry so she offered a formula bottle. She reports that baby did not take the bottle well. Explain the feeding in the first 24 hr. Mom says she wants to breast and formula feed. She did not seem interested in offering supplement at the breast or cup/spoon/syringe. She will continue to latch baby on demand and call lactation for support as needed.   Maternal Data    Feeding Feeding Type: Bottle Fed - Formula Nipple Type: Slow - flow  LATCH Score/Interventions                      Lactation Tools Discussed/Used     Consult Status Consult Status: Follow-up Date: 03/21/16 Follow-up type: In-patient    Denzil Hughes 03/20/2016, 8:32 PM

## 2016-03-20 NOTE — Anesthesia Postprocedure Evaluation (Signed)
Anesthesia Post Note  Patient: Bridget [name]  Procedure(s) Performed: Procedure(s) (LRB): CESAREAN SECTION (N/A) BILATERAL TUBAL LIGATION (Bilateral)  Patient location during evaluation: PACU Anesthesia Type: Spinal Level of consciousness: awake and alert Pain management: pain level controlled Vital Signs Assessment: post-procedure vital signs reviewed and stable Respiratory status: spontaneous breathing and respiratory function stable Cardiovascular status: blood pressure returned to baseline and stable Postop Assessment: spinal receding Anesthetic complications: no        Last Vitals:  Vitals:   03/20/16 1601 03/20/16 1616  BP: 121/74 123/74  Pulse: (!) 55 65  Resp: 12 15  Temp:      Last Pain:  Vitals:   03/20/16 1546  TempSrc: Oral  PainSc:    Pain Goal:    LLE Motor Response: (P) Purposeful movement (03/20/16 1616) LLE Sensation: (P) Increased (03/20/16 1616) RLE Motor Response: (P) Purposeful movement (03/20/16 1616) RLE Sensation: (P) Increased (03/20/16 1616)      Tiajuana Amass

## 2016-03-21 LAB — COMPREHENSIVE METABOLIC PANEL
ALT: 18 U/L (ref 14–54)
AST: 41 U/L (ref 15–41)
Albumin: 2.5 g/dL — ABNORMAL LOW (ref 3.5–5.0)
Alkaline Phosphatase: 42 U/L (ref 38–126)
Anion gap: 7 (ref 5–15)
BUN: 8 mg/dL (ref 6–20)
CO2: 22 mmol/L (ref 22–32)
Calcium: 8.9 mg/dL (ref 8.9–10.3)
Chloride: 106 mmol/L (ref 101–111)
Creatinine, Ser: 0.56 mg/dL (ref 0.44–1.00)
GFR calc Af Amer: >60 mL/min (ref >60)
GFR calc non Af Amer: >60 mL/min (ref >60)
Glucose, Bld: 98 mg/dL (ref 65–99)
Potassium: 3.7 mmol/L (ref 3.5–5.1)
Sodium: 135 mmol/L (ref 135–145)
Total Bilirubin: 1.4 mg/dL — ABNORMAL HIGH (ref 0.3–1.2)
Total Protein: 5.5 g/dL — ABNORMAL LOW (ref 6.5–8.1)

## 2016-03-21 LAB — CBC
HCT: 29.2 % — ABNORMAL LOW (ref 36.0–46.0)
HCT: 28.2 % — ABNORMAL LOW (ref 36.0–46.0)
Hemoglobin: 10.1 g/dL — ABNORMAL LOW (ref 12.0–15.0)
Hemoglobin: 9.7 g/dL — ABNORMAL LOW (ref 12.0–15.0)
MCH: 31.1 pg (ref 26.0–34.0)
MCH: 31.2 pg (ref 26.0–34.0)
MCHC: 34.6 g/dL (ref 30.0–36.0)
MCHC: 34.4 g/dL (ref 30.0–36.0)
MCV: 89.8 fL (ref 78.0–100.0)
MCV: 90.7 fL (ref 78.0–100.0)
Platelets: 249 10*3/uL (ref 150–400)
Platelets: 219 10*3/uL (ref 150–400)
RBC: 3.25 MIL/uL — ABNORMAL LOW (ref 3.87–5.11)
RBC: 3.11 MIL/uL — ABNORMAL LOW (ref 3.87–5.11)
RDW: 13.6 % (ref 11.5–15.5)
RDW: 13.8 % (ref 11.5–15.5)
WBC: 11.5 10*3/uL — ABNORMAL HIGH (ref 4.0–10.5)
WBC: 8.8 10*3/uL (ref 4.0–10.5)

## 2016-03-21 LAB — BIRTH TISSUE RECOVERY COLLECTION (PLACENTA DONATION)

## 2016-03-21 MED ORDER — LACTATED RINGERS IV BOLUS (SEPSIS)
500.0000 mL | Freq: Once | INTRAVENOUS | Status: AC
  Administered 2016-03-21: 500 mL via INTRAVENOUS

## 2016-03-21 MED ORDER — TRIAMTERENE 50 MG PO CAPS
50.0000 mg | ORAL_CAPSULE | Freq: Two times a day (BID) | ORAL | Status: DC
  Administered 2016-03-21 – 2016-03-23 (×4): 50 mg via ORAL
  Filled 2016-03-21 (×6): qty 1

## 2016-03-21 NOTE — Progress Notes (Signed)
Dr Nelda Marseille notified regarding low UOP.  Ordered another round of 500cc bolus. Will continue to monitor.

## 2016-03-21 NOTE — Progress Notes (Signed)
Ozan called regarding low UOP.  See new orders.  Coming to bedside to evaluate patient. Will cont to monitor.

## 2016-03-21 NOTE — Progress Notes (Signed)
Postoperative Note Day # 1  S:  Patient resting comfortable in bed.  Pain controlled.  Tolerating general diet. No flatus, no BM.  Lochia moderate.  Ambulating without difficulty.  She denies n/v/f/c, SOB, or CP.  Foley still in place this am.  Pt plans on breastfeeding.  O: Temp:  [97.4 F (36.3 C)-98.3 F (36.8 C)] 97.9 F (36.6 C) (01/04 0419) Pulse Rate:  [55-99] 90 (01/04 0419) Resp:  [12-20] 16 (01/04 0030) BP: (97-147)/(45-92) 114/85 (01/04 0419) SpO2:  [96 %-100 %] 96 % (01/04 0419)   Gen: A&Ox3, NAD CV: RRR Resp: CTAB Abdomen: obese, soft, NT, ND BS quiet Uterus: firm, non-tender, below umbilicus Incision: c/d/i, bandage and PICO on Ext: 1+ pitting edema, no calf tenderness bilaterally bilaterally  Labs:  Recent Labs  03/20/16 0810 03/21/16 0520  HGB 11.5* 10.1*    A/P: Pt is a 41 y.o. [id number] s/p primary C-section due to fetal malpresentation, POD#1  - Pain well controlled -GU: Foley to be removed this am -GI: Tolerating general diet -Activity: encouraged sitting up to chair and ambulation as tolerated -Prophylaxis: early ambulation, encourage SCDs while in bed -Labs: stable as above  Janyth Pupa, DO [phone] (pager) [phone] (office)

## 2016-03-21 NOTE — Lactation Note (Signed)
This note was copied from a baby's chart. Lactation Consultation Note Mom had breast reduction several years ago. Mom shown how to use DEBP & how to disassemble, clean, & reassemble parts. Mom knows to pump q3h for 15-20 min. Encouraged to pump for lactation induction. Breast tissue soft, nipple everted, rolls well in finger tips to evert for a deeper latch. Mom stated baby has been BF great all night.  Mom encouraged to feed baby 8-12 times/24 hours and with feeding cues. Educated about newborn behavior, feeding habits, I&O, STS, supply and demand.  Patient Name: Bridget [name] [id number] Date: 03/21/2016 Reason for consult: Follow-up assessment;Breast surgery   Maternal Data    Feeding Feeding Type: Breast Fed Length of feed: 10 min  LATCH Score/Interventions       Type of Nipple: Everted at rest and after stimulation  Comfort (Breast/Nipple): Soft / non-tender           Lactation Tools Discussed/Used Tools: Pump Breast pump type: Double-Electric Breast Pump Pump Review: Setup, frequency, and cleaning;Milk Storage Initiated by:: L.Marlene Pfluger RN IBCLC Date initiated:: 03/21/16   Consult Status Consult Status: Follow-up Date: 03/21/16 Follow-up type: In-patient    Theodoro Kalata 03/21/2016, 3:53 AM

## 2016-03-21 NOTE — Anesthesia Postprocedure Evaluation (Signed)
Anesthesia Post Note  Patient: Bridget [name]  Procedure(s) Performed: Procedure(s) (LRB): CESAREAN SECTION (N/A) BILATERAL TUBAL LIGATION (Bilateral)  Patient location during evaluation: Mother Baby Anesthesia Type: Spinal Level of consciousness: oriented and awake and alert Pain management: pain level controlled Vital Signs Assessment: post-procedure vital signs reviewed and stable Respiratory status: spontaneous breathing, respiratory function stable and patient connected to nasal cannula oxygen Cardiovascular status: blood pressure returned to baseline and stable Postop Assessment: no headache and no backache Anesthetic complications: no        Last Vitals:  Vitals:   03/21/16 0030 03/21/16 0419  BP: (!) 145/86 114/85  Pulse: 89 90  Resp: 16   Temp: 36.6 C 36.6 C    Last Pain:  Vitals:   03/21/16 0419  TempSrc: Oral  PainSc: 0-No pain   Pain Goal:                 [employer]

## 2016-03-21 NOTE — Addendum Note (Signed)
Addendum  created 03/21/16 0745 by Alvy Bimler, CRNA   Sign clinical note

## 2016-03-21 NOTE — Progress Notes (Signed)
At bedside to evaluate due to low UOP.  Pt is asymptomatic- no dizziness, headache.  Up to void without difficulties.  Vitals have been stable.  Despite IV fluid bolus due to dark urine, UOP remains low about 50cc in the past 2hrs.  Upon further questioning pt reports long standing h/o peripheral edema and states that prior to pregnancy she was taking Triamterene twice daily.  She reports no CP/SOB.  Ambulating without difficulty.  O: BP 124/64 (BP Location: Left Arm)   Pulse 80   Temp 98.3 F (36.8 C) (Oral)   Resp 18   Ht 5\' 5"  (1.651 m)   Wt (!) 354 lb (160.6 kg)   LMP 06/24/2015 (Approximate)   SpO2 96%   Breastfeeding? Unknown   BMI 58.91 kg/m   Gen: NAD CV: RRR Lungs:CTAB Abd: edema noted in abdomen, soft, non-tender, no rebound, no guarding Ext: 2+ pitting edema, no calf tenderness bilaterally  Results for orders placed or performed during the hospital encounter of 03/20/16 (from the past 24 hour(s))  CBC     Status: Abnormal   Collection Time: 03/21/16  5:20 AM  Result Value Ref Range   WBC 11.5 (H) 4.0 - 10.5 K/uL   RBC 3.25 (L) 3.87 - 5.11 MIL/uL   Hemoglobin 10.1 (L) 12.0 - 15.0 g/dL   HCT 29.2 (L) 36.0 - 46.0 %   MCV 89.8 78.0 - 100.0 fL   MCH 31.1 26.0 - 34.0 pg   MCHC 34.6 30.0 - 36.0 g/dL   RDW 13.6 11.5 - 15.5 %   Platelets 249 150 - 400 K/uL  Collect bld for placenta donatation     Status: None   Collection Time: 03/21/16  5:28 AM  Result Value Ref Range   Placenta donation bld collect COLLECTED BY LABORATORY   CBC     Status: Abnormal   Collection Time: 03/21/16  5:00 PM  Result Value Ref Range   WBC 8.8 4.0 - 10.5 K/uL   RBC 3.11 (L) 3.87 - 5.11 MIL/uL   Hemoglobin 9.7 (L) 12.0 - 15.0 g/dL   HCT 28.2 (L) 36.0 - 46.0 %   MCV 90.7 78.0 - 100.0 fL   MCH 31.2 26.0 - 34.0 pg   MCHC 34.4 30.0 - 36.0 g/dL   RDW 13.8 11.5 - 15.5 %   Platelets 219 150 - 400 K/uL   A/P:  [id number] s/p primary C-section for malpresentation, POD#1 1) Low UOP:   - No  evidence of active bleeding, VSS and pt asymptomatic - no further fluids, just po hydration - plan to restart diuretic twice daily - CBC stable as above, CMP pending - continue foley overnight for strict I/Os 2) Continue routine postoperative care  Janyth Pupa, DO [phone] (pager) [phone] (office)

## 2016-03-21 NOTE — Lactation Note (Addendum)
This note was copied from a baby's chart. Lactation Consultation Note Follow up visit at 28 hours of age.   Baby latched to right breast with intermittent sucking, mom denies pain and reports feeding of about 20 minutes.   Kinta asked mom about previous experience with breastfeeding to assess milk supply due to history of breast surgery.  Mom reports she has answered this to many times and is getting very irritated. [place] apologized and asked mom if she has any concerns. Mom reports concerns about supply so she is giving formula in small amounts like 5-38mls. LC explained as a good volume due to stomach size and plans to breastfeed.  Mom is not receptive to consult at this time and RN returned to bedside. LC advised mom to call for assist as needed.    Patient Name: Bridget [name] [id number] Date: 03/21/2016     Maternal Data    Feeding Feeding Type: Breast Fed Length of feed: 13 min  LATCH Score/Interventions Latch: Grasps breast easily, tongue down, lips flanged, rhythmical sucking.  Audible Swallowing: A few with stimulation Intervention(s): Skin to skin  Type of Nipple: Everted at rest and after stimulation  Comfort (Breast/Nipple): Soft / non-tender     Hold (Positioning): No assistance needed to correctly position infant at breast.  LATCH Score: 9  Lactation Tools Discussed/Used     Consult Status      Ray Gervasi, Justine Null 03/21/2016, 5:37 PM

## 2016-03-21 NOTE — Progress Notes (Signed)
Call MD regarding dec UOP. Per Ozan, give 500cc bolus, keep foley in longer and encourage walking and drinking fluids.  Will continue to monitor.

## 2016-03-22 ENCOUNTER — Encounter (HOSPITAL_COMMUNITY): Payer: Self-pay

## 2016-03-22 NOTE — Progress Notes (Addendum)
When night shift Rn assessed patient, Rn noticed Stat lock for foley catheter was partially on patient's upper thigh. The Patient stated she had refused to let previous Rn continue to take the stat lock off because it hurt when  attempted to be removed by Rn. The patient stated she wanted to remove the stat lock herself.

## 2016-03-22 NOTE — Progress Notes (Signed)
Subjective: Postpartum Day 2: Cesarean Delivery Patient reports tolerating PO, + flatus and no problems voiding.    Objective: Vital signs in last 24 hours: Temp:  [97.8 F (36.6 C)-98 F (36.7 C)] 98 F (36.7 C) (01/05 0615) Pulse Rate:  [80-81] 81 (01/05 0615) Resp:  [18] 18 (01/05 0615) BP: (115-130)/(56-78) 130/78 (01/05 0615)  Physical Exam:  General: alert and cooperative Lochia: appropriate Uterine Fundus: firm Incision: bandage clean dry and intact.Marland Kitchen PICO negative pressure device in place  DVT Evaluation: No evidence of DVT seen on physical exam.   Recent Labs  03/21/16 0520 03/21/16 1700  HGB 10.1* 9.7*  HCT 29.2* 28.2*    Assessment/Plan: Status post Cesarean section. Doing well postoperatively.   Urine out put adequate  Edema has improved with triamterene continue 50 mg BID as pt was on this prepregnancy.  Pt encouraged to ambulate in halls TID. Probable discharge home tomorrow.Catha Brow. 03/22/2016, 3:14 PM

## 2016-03-23 ENCOUNTER — Encounter (HOSPITAL_COMMUNITY): Payer: Self-pay | Admitting: Obstetrics and Gynecology

## 2016-03-23 MED ORDER — OXYCODONE HCL 5 MG PO TABS: ORAL_TABLET | ORAL | 0 refills | Status: DC

## 2016-03-23 MED ORDER — IBUPROFEN 600 MG PO TABS: 600.0000 mg | ORAL_TABLET | Freq: Four times a day (QID) | ORAL | 1 refills | Status: DC | PRN

## 2016-03-23 MED ORDER — TRIAMTERENE 50 MG PO CAPS: 50.0000 mg | ORAL_CAPSULE | Freq: Two times a day (BID) | ORAL | 1 refills | Status: DC

## 2016-03-23 NOTE — Discharge Summary (Signed)
OB Discharge Summary     Patient Name: Bridget [name] DOB: 04-Jul-1975 MRN: [id number]  Date of admission: 03/20/2016 Delivering MD: Christophe Louis   Date of discharge: 03/23/2016  Admitting diagnosis: 39wks, induction Intrauterine pregnancy: [redacted]w[redacted]d     Secondary diagnosis:  Active Problems:   Term pregnancy  Additional problems: Morbid Obesity 2) malpresentation      Discharge diagnosis: Term Pregnancy Delivered                                                                                                Post partum procedures:None  Augmentation: NA  Complications: None  Hospital course:  Pt was admitted for induction however fetus was found to be transverse presentation. THe patient was then consented for primary cesarean section with bilateral tubal ligation. THe above surgery was performed.  She did well postoperatively with return of bowel and bladder function   Physical exam  Vitals:   03/21/16 1738 03/22/16 0615 03/22/16 1754 03/23/16 0617  BP: (!) 115/56 130/78 135/75 135/85  Pulse: 80 81 84 78  Resp: 18 18 16 18   Temp: 97.8 F (36.6 C) 98 F (36.7 C) 97.6 F (36.4 C) 98.4 F (36.9 C)  TempSrc: Oral Oral Axillary Oral  SpO2:   100%   Weight:      Height:       General: alert, cooperative and no distress Lochia: appropriate Uterine Fundus: firm Incision: Dressing is clean, dry, and intact DVT Evaluation: No evidence of DVT seen on physical exam. Labs: Lab Results  Component Value Date   WBC 8.8 03/21/2016   HGB 9.7 (L) 03/21/2016   HCT 28.2 (L) 03/21/2016   MCV 90.7 03/21/2016   PLT 219 03/21/2016   CMP Latest Ref Rng & Units 03/21/2016  Glucose 65 - 99 mg/dL 98  BUN 6 - 20 mg/dL 8  Creatinine 0.44 - 1.00 mg/dL 0.56  Sodium 135 - 145 mmol/L 135  Potassium 3.5 - 5.1 mmol/L 3.7  Chloride 101 - 111 mmol/L 106  CO2 22 - 32 mmol/L 22  Calcium 8.9 - 10.3 mg/dL 8.9  Total Protein 6.5 - 8.1 g/dL 5.5(L)  Total Bilirubin 0.3 - 1.2 mg/dL 1.4(H)  Alkaline  Phos 38 - 126 U/L 42  AST 15 - 41 U/L 41  ALT 14 - 54 U/L 18    Discharge instruction: per After Visit Summary and "Baby and Me Booklet".  After visit meds:  Allergies as of 03/23/2016   No Known Allergies     Medication List    STOP taking these medications   oxyCODONE-acetaminophen 5-325 MG tablet Commonly known as:  PERCOCET/ROXICET     TAKE these medications   ibuprofen 600 MG tablet Commonly known as:  ADVIL,MOTRIN Take 1 tablet (600 mg total) by mouth every 6 (six) hours as needed.   oxyCODONE 5 MG immediate release tablet Commonly known as:  Oxy IR/ROXICODONE 1-2 tablets po  every 6 hours as needed for severe pain   PRENATAL VITAMIN PO Take 1 tablet by mouth daily.   tamsulosin 0.4 MG Caps capsule Commonly known as:  FLOMAX  Take 1 capsule (0.4 mg total) by mouth daily. Take until your pain resolves, for up to one month   triamterene 50 MG capsule Commonly known as:  DYRENIUM Take 1 capsule (50 mg total) by mouth 2 (two) times daily.       Diet: carb modified diet  Activity: Advance as tolerated. Pelvic rest for 6 weeks.   Outpatient follow up:2 weeks Follow up Appt:No future appointments. Follow up Visit:No Follow-up on file.  Postpartum contraception: Tubal Ligation  Newborn Data: Live born female  Birth Weight: 9 lb 1.7 oz (4130 g) APGAR: 8, 9  Baby Feeding: Breast Disposition:home with mother   03/23/2016 Catha Brow., MD

## 2016-03-23 NOTE — Progress Notes (Signed)
Subjective: Postpartum Day 3: Cesarean Delivery Patient reports tolerating PO, + flatus and no problems voiding.    Objective: Vital signs in last 24 hours: Temp:  [97.6 F (36.4 C)-98.4 F (36.9 C)] 98.4 F (36.9 C) (01/06 0617) Pulse Rate:  [78-84] 78 (01/06 0617) Resp:  [16-18] 18 (01/06 0617) BP: (135)/(75-85) 135/85 (01/06 0617) SpO2:  [100 %] 100 % (01/05 1754)  Physical Exam:  General: alert and cooperative Lochia: appropriate Uterine Fundus: firm Incision: Pico in place and functioning well  DVT Evaluation: No evidence of DVT seen on physical exam.   Recent Labs  03/21/16 0520 03/21/16 1700  HGB 10.1* 9.7*  HCT 29.2* 28.2*    Assessment/Plan: Status post Cesarean section. Doing well postoperatively.  Discharge home with standard precautions and return to clinic in 1 week for incision check   Elianny Buxbaum J. 03/23/2016, 6:58 AM

## 2016-03-23 NOTE — Progress Notes (Signed)
This note copied from Pt's infant's chart. MOB is this patient. MOB called on call bell to be walked out of/ ready to discharge. This nurse was watching the desk and responded to patient's request since primary RN was at lunch and said that the couplet was ready to go. MOB very upset about FOB being requested to pull up car to entrance. She states that she should be able to walk out how she needs to. RN apologized for following normal procedure. MOB insists that this nurse was unprofessional for doing it this way and she would report this nurse for the behavior.  RN asked if MOB would like another person to walk her out past security. The MOB sated she didn't care, she was just ready to go. She asked what the hold up was, and this RN stated she was waiting for the FOB to return to the room like the MOB requested.  Meanwhile FOB pulled up car to hospital entrance, and returned to MOB's room per MOB's request to collect her, the 30 yr old and the infant.  This RN asked if the family would like a women's hospital gift bag. The FOB graciously accepted. The RN got the bag from the nursery and walked the family out slowly, keeping pace with the MOB. When almost to the door, the MOB apologized for her outburst against this nurse, stating that she did receive some unprofessional treatment, and that she was on edge. When walking out to the car, the MOB did state that it was really cold and encouraged her husband to hurry up and get the baby in the car.

## 2016-04-10 ENCOUNTER — Other Ambulatory Visit: Payer: Self-pay | Admitting: *Deleted

## 2016-04-10 NOTE — Telephone Encounter (Signed)
Rf req for phentermine 37.5 mg 1 po qam. # 30. Last filled 07/05/15. Ok to Rf?

## 2016-04-12 ENCOUNTER — Other Ambulatory Visit: Payer: Self-pay | Admitting: *Deleted

## 2016-04-12 MED ORDER — TRIAMTERENE 50 MG PO CAPS: 50.0000 mg | ORAL_CAPSULE | Freq: Two times a day (BID) | ORAL | 0 refills | Status: DC

## 2016-04-12 NOTE — Telephone Encounter (Signed)
Pt left msg on triage stating she is needing refill on her triamterene & phentermine. Have appt schedule to see md on 2/6. Will send 30 day to [facility] for triamterene. MD has denied phentermine until appt...Johny Chess

## 2016-04-23 ENCOUNTER — Encounter: Payer: Self-pay | Admitting: Internal Medicine

## 2016-04-23 ENCOUNTER — Ambulatory Visit (INDEPENDENT_AMBULATORY_CARE_PROVIDER_SITE_OTHER): Payer: 59 | Admitting: Internal Medicine

## 2016-04-23 ENCOUNTER — Other Ambulatory Visit (INDEPENDENT_AMBULATORY_CARE_PROVIDER_SITE_OTHER): Payer: 59

## 2016-04-23 DIAGNOSIS — D509 Iron deficiency anemia, unspecified: Secondary | ICD-10-CM | POA: Diagnosis not present

## 2016-04-23 DIAGNOSIS — E538 Deficiency of other specified B group vitamins: Secondary | ICD-10-CM | POA: Diagnosis not present

## 2016-04-23 DIAGNOSIS — E559 Vitamin D deficiency, unspecified: Secondary | ICD-10-CM

## 2016-04-23 DIAGNOSIS — Z0000 Encounter for general adult medical examination without abnormal findings: Secondary | ICD-10-CM | POA: Diagnosis not present

## 2016-04-23 DIAGNOSIS — D649 Anemia, unspecified: Secondary | ICD-10-CM | POA: Insufficient documentation

## 2016-04-23 LAB — BASIC METABOLIC PANEL
BUN: 12 mg/dL (ref 6–23)
CO2: 27 mEq/L (ref 19–32)
Calcium: 9.3 mg/dL (ref 8.4–10.5)
Chloride: 105 mEq/L (ref 96–112)
Creatinine, Ser: 0.54 mg/dL (ref 0.40–1.20)
GFR: 161.74 mL/min (ref >60.00)
Glucose, Bld: 91 mg/dL (ref 70–99)
Potassium: 4.5 mEq/L (ref 3.5–5.1)
Sodium: 138 mEq/L (ref 135–145)

## 2016-04-23 LAB — CBC WITH DIFFERENTIAL/PLATELET
Basophils Absolute: 0 10*3/uL (ref 0.0–0.1)
Basophils Relative: 0.5 % (ref 0.0–3.0)
Eosinophils Absolute: 0.1 10*3/uL (ref 0.0–0.7)
Eosinophils Relative: 1.8 % (ref 0.0–5.0)
HCT: 41.3 % (ref 36.0–46.0)
Hemoglobin: 13.7 g/dL (ref 12.0–15.0)
Lymphocytes Relative: 31 % (ref 12.0–46.0)
Lymphs Abs: 2.4 10*3/uL (ref 0.7–4.0)
MCHC: 33 g/dL (ref 30.0–36.0)
MCV: 91.5 fl (ref 78.0–100.0)
Monocytes Absolute: 0.5 10*3/uL (ref 0.1–1.0)
Monocytes Relative: 7.1 % (ref 3.0–12.0)
Neutro Abs: 4.6 10*3/uL (ref 1.4–7.7)
Neutrophils Relative %: 59.6 % (ref 43.0–77.0)
Platelets: 261 10*3/uL (ref 150.0–400.0)
RBC: 4.51 Mil/uL (ref 3.87–5.11)
RDW: 14.7 % (ref 11.5–15.5)
WBC: 7.7 10*3/uL (ref 4.0–10.5)

## 2016-04-23 LAB — VITAMIN B12: Vitamin B-12: 199 pg/mL — ABNORMAL LOW (ref 211–911)

## 2016-04-23 LAB — IBC PANEL
Iron: 134 ug/dL (ref 42–145)
Saturation Ratios: 35.6 % (ref 20.0–50.0)
Transferrin: 269 mg/dL (ref 212.0–360.0)

## 2016-04-23 LAB — VITAMIN D 25 HYDROXY (VIT D DEFICIENCY, FRACTURES): VITD: 12.24 ng/mL — ABNORMAL LOW (ref 30.00–100.00)

## 2016-04-23 LAB — TSH: TSH: 0.76 u[IU]/mL (ref 0.35–4.50)

## 2016-04-23 MED ORDER — VITAMIN D3 50 MCG (2000 UT) PO CAPS: 2000.0000 [IU] | ORAL_CAPSULE | Freq: Every day | ORAL | 3 refills | Status: DC

## 2016-04-23 MED ORDER — FERROUS SULFATE 325 (65 FE) MG PO TABS: 325.0000 mg | ORAL_TABLET | Freq: Every day | ORAL | 3 refills | Status: DC

## 2016-04-23 MED ORDER — PHENTERMINE HCL 37.5 MG PO TABS: 37.5000 mg | ORAL_TABLET | Freq: Every day | ORAL | 2 refills | Status: DC

## 2016-04-23 MED ORDER — VITAMIN B-12 1000 MCG SL SUBL: 1.0000 | SUBLINGUAL_TABLET | Freq: Every day | SUBLINGUAL | 3 refills | Status: DC

## 2016-04-23 MED ORDER — TRIAMTERENE-HCTZ 37.5-25 MG PO TABS: 1.0000 | ORAL_TABLET | Freq: Every day | ORAL | 11 refills | Status: DC

## 2016-04-23 NOTE — Progress Notes (Signed)
Pre visit review using our clinic review tool, if applicable. No additional management support is needed unless otherwise documented below in the visit note. 

## 2016-04-23 NOTE — Assessment & Plan Note (Signed)
Phentermine Wt watchers  Potential benefits of a long term phentermine   use as well as potential risks  and complications were explained to the patient and were aknowledged.

## 2016-04-23 NOTE — Assessment & Plan Note (Signed)
FeSO4 Labs

## 2016-04-23 NOTE — Assessment & Plan Note (Signed)
On B12 

## 2016-04-23 NOTE — Assessment & Plan Note (Signed)
On Vit D 

## 2016-04-23 NOTE — Progress Notes (Signed)
Subjective:  Patient ID: Bridget [name], female    DOB: [birth year]  Age: 41 y.o. MRN: [id number]  CC: No chief complaint on file.   HPI Bridget [name] presents for check up.  She had a baby last month via C section. Pt  lost wt after delivery. Her GYN increased Triam to 50 mg due to edema. Not breast feeding...  Outpatient Medications Prior to Visit  Medication Sig Dispense Refill  . ibuprofen (ADVIL,MOTRIN) 600 MG tablet Take 1 tablet (600 mg total) by mouth every 6 (six) hours as needed. 30 tablet 1  . oxyCODONE (OXY IR/ROXICODONE) 5 MG immediate release tablet 1-2 tablets po  every 6 hours as needed for severe pain 30 tablet 0  . Prenatal Vit-Fe Fumarate-FA (PRENATAL VITAMIN PO) Take 1 tablet by mouth daily.     . tamsulosin (FLOMAX) 0.4 MG CAPS capsule Take 1 capsule (0.4 mg total) by mouth daily. Take until your pain resolves, for up to one month 30 capsule 0  . triamterene (DYRENIUM) 50 MG capsule Take 1 capsule (50 mg total) by mouth 2 (two) times daily. Keep 04/23/16 appt for future refills 60 capsule 0   No facility-administered medications prior to visit.     ROS Review of Systems  Constitutional: Negative for activity change, appetite change, chills, fatigue and unexpected weight change.  HENT: Negative for congestion, mouth sores and sinus pressure.   Eyes: Negative for visual disturbance.  Respiratory: Negative for cough and chest tightness.   Gastrointestinal: Negative for abdominal pain and nausea.  Genitourinary: Negative for difficulty urinating, frequency and vaginal pain.  Musculoskeletal: Negative for back pain and gait problem.  Skin: Negative for pallor and rash.  Neurological: Negative for dizziness, tremors, weakness, numbness and headaches.  Psychiatric/Behavioral: Negative for confusion and sleep disturbance.    Objective:  BP 134/76   Pulse 86   Temp 98.7 F (37.1 C) (Oral)   Resp 20   Wt 298 lb 8 oz (135.4 kg)   SpO2 98%   BMI 49.67 kg/m    BP Readings from Last 3 Encounters:  04/23/16 134/76  03/23/16 135/85  02/29/16 137/80    Wt Readings from Last 3 Encounters:  04/23/16 298 lb 8 oz (135.4 kg)  03/20/16 (!) 354 lb (160.6 kg)  02/29/16 (!) 354 lb 2 oz (160.6 kg)    Physical Exam  Constitutional: She appears well-developed. No distress.  HENT:  Head: Normocephalic.  Right Ear: External ear normal.  Left Ear: External ear normal.  Nose: Nose normal.  Mouth/Throat: Oropharynx is clear and moist.  Eyes: Conjunctivae are normal. Pupils are equal, round, and reactive to light. Right eye exhibits no discharge. Left eye exhibits no discharge.  Neck: Normal range of motion. Neck supple. No JVD present. No tracheal deviation present. No thyromegaly present.  Cardiovascular: Normal rate, regular rhythm and normal heart sounds.   Pulmonary/Chest: No stridor. No respiratory distress. She has no wheezes.  Abdominal: Soft. Bowel sounds are normal. She exhibits no distension and no mass. There is no tenderness. There is no rebound and no guarding.  Musculoskeletal: She exhibits no edema or tenderness.  Lymphadenopathy:    She has no cervical adenopathy.  Neurological: She displays normal reflexes. No cranial nerve deficit. She exhibits normal muscle tone. Coordination normal.  Skin: No rash noted. No erythema.  Psychiatric: She has a normal mood and affect. Her behavior is normal. Judgment and thought content normal.    Lab Results  Component Value Date  WBC 8.8 03/21/2016   HGB 9.7 (L) 03/21/2016   HCT 28.2 (L) 03/21/2016   PLT 219 03/21/2016   GLUCOSE 98 03/21/2016   CHOL 135 07/11/2015   TRIG 108.0 07/11/2015   HDL 37.80 (L) 07/11/2015   LDLCALC 76 07/11/2015   ALT 18 03/21/2016   AST 41 03/21/2016   NA 135 03/21/2016   K 3.7 03/21/2016   CL 106 03/21/2016   CREATININE 0.56 03/21/2016   BUN 8 03/21/2016   CO2 22 03/21/2016   TSH 0.89 07/11/2015   HGBA1C 5.3 05/06/2014    [place] Mfm Ob Limited  Result  Date: 03/20/2016 OBSTETRICAL ULTRASOUND: This exam was performed within a [place] [facility]. The OB US report was generated in the [facility] system, and faxed to the ordering physician.  This report is available in the [facility]. See the [facility] Obstetric US report via the Image Link.   Assessment & Plan:   There are no diagnoses linked to this encounter. I am having Ms. Name maintain her Prenatal Vit-Fe Fumarate-FA (PRENATAL VITAMIN PO), tamsulosin, ibuprofen, oxyCODONE, and triamterene.  No orders of the defined types were placed in this encounter.    Follow-up: No Follow-up on file.  Walker Kehr, MD

## 2016-04-24 MED ORDER — VITAMIN D3 1.25 MG (50000 UT) PO CAPS: 1.0000 | ORAL_CAPSULE | ORAL | 0 refills | Status: DC

## 2016-04-24 MED ORDER — CYANOCOBALAMIN 500 MCG/0.1ML NA SOLN: 500.0000 ug | NASAL | 11 refills | Status: DC

## 2016-04-24 NOTE — Assessment & Plan Note (Signed)
Worse Re-start Vit D Rx

## 2016-04-24 NOTE — Assessment & Plan Note (Signed)
Worse Start Nasal Vit B12

## 2016-04-24 NOTE — Progress Notes (Signed)
Subjective:  Patient ID: Bridget [name], female    DOB: [birth year]  Age: 41 y.o. MRN: [id number]  CC: No chief complaint on file.   HPI AVABELLE WOODDELL presents for a well exam. C/o fatigue and wt gain  Outpatient Medications Prior to Visit  Medication Sig Dispense Refill  . Cholecalciferol (VITAMIN D3) 2000 units capsule Take 1 capsule (2,000 Units total) by mouth daily. 100 capsule 3  . ferrous sulfate 325 (65 FE) MG tablet Take 1 tablet (325 mg total) by mouth daily. 30 tablet 3  . phentermine (ADIPEX-P) 37.5 MG tablet Take 1 tablet (37.5 mg total) by mouth daily before breakfast. 30 tablet 2  . Prenatal Vit-Fe Fumarate-FA (PRENATAL VITAMIN PO) Take 1 tablet by mouth daily.     Marland Kitchen triamterene-hydrochlorothiazide (MAXZIDE-25) 37.5-25 MG tablet Take 1 tablet by mouth daily. 30 tablet 11  . Cyanocobalamin (VITAMIN B-12) 1000 MCG SUBL Place 1 tablet (1,000 mcg total) under the tongue daily. 100 tablet 3   No facility-administered medications prior to visit.     ROS Review of Systems  Constitutional: Positive for fatigue and unexpected weight change. Negative for activity change, appetite change and chills.  HENT: Negative for congestion, mouth sores and sinus pressure.   Eyes: Negative for visual disturbance.  Respiratory: Negative for cough and chest tightness.   Gastrointestinal: Negative for abdominal pain and nausea.  Genitourinary: Negative for difficulty urinating, frequency and vaginal pain.  Musculoskeletal: Negative for back pain and gait problem.  Skin: Negative for pallor and rash.  Neurological: Negative for dizziness, tremors, weakness, numbness and headaches.  Psychiatric/Behavioral: Negative for confusion, sleep disturbance and suicidal ideas.    Objective:  There were no vitals taken for this visit.  BP Readings from Last 3 Encounters:  04/23/16 134/76  03/23/16 135/85  02/29/16 137/80    Wt Readings from Last 3 Encounters:  04/23/16 298 lb 8 oz (135.4  kg)  03/20/16 (!) 354 lb (160.6 kg)  02/29/16 (!) 354 lb 2 oz (160.6 kg)    Physical Exam  Constitutional: She appears well-developed. No distress.  HENT:  Head: Normocephalic.  Right Ear: External ear normal.  Left Ear: External ear normal.  Nose: Nose normal.  Mouth/Throat: Oropharynx is clear and moist.  Eyes: Conjunctivae are normal. Pupils are equal, round, and reactive to light. Right eye exhibits no discharge. Left eye exhibits no discharge.  Neck: Normal range of motion. Neck supple. No JVD present. No tracheal deviation present. No thyromegaly present.  Cardiovascular: Normal rate, regular rhythm and normal heart sounds.   Pulmonary/Chest: No stridor. No respiratory distress. She has no wheezes.  Abdominal: Soft. Bowel sounds are normal. She exhibits no distension and no mass. There is no tenderness. There is no rebound and no guarding.  Musculoskeletal: She exhibits no edema or tenderness.  Lymphadenopathy:    She has no cervical adenopathy.  Neurological: She displays normal reflexes. No cranial nerve deficit. She exhibits normal muscle tone. Coordination normal.  Skin: No rash noted. No erythema.  Psychiatric: She has a normal mood and affect. Her behavior is normal. Judgment and thought content normal.  Obese  Lab Results  Component Value Date   WBC 7.7 04/23/2016   HGB 13.7 04/23/2016   HCT 41.3 04/23/2016   PLT 261.0 04/23/2016   GLUCOSE 91 04/23/2016   CHOL 135 07/11/2015   TRIG 108.0 07/11/2015   HDL 37.80 (L) 07/11/2015   LDLCALC 76 07/11/2015   ALT 18 03/21/2016   AST 41 03/21/2016  NA 138 04/23/2016   K 4.5 04/23/2016   CL 105 04/23/2016   CREATININE 0.54 04/23/2016   BUN 12 04/23/2016   CO2 27 04/23/2016   TSH 0.76 04/23/2016   HGBA1C 5.3 05/06/2014    [place] Mfm Ob Limited  Result Date: 03/20/2016 OBSTETRICAL ULTRASOUND: This exam was performed within a  Ultrasound Department. The OB US report was generated in the AS system, and faxed to  the ordering physician.  This report is available in the [facility]. See the AS Obstetric US report via the Image Link.   Assessment & Plan:   Diagnoses and all orders for this visit:  Vitamin D deficiency -     VITAMIN D 25 Hydroxy (Vit-D Deficiency, Fractures)  B12 deficiency -     Vitamin B12  OBESITY, MORBID -     IBC panel -     CBC with Differential/Platelet -     Basic metabolic panel -     TSH  Other orders -     Cholecalciferol (VITAMIN D3) 50000 units CAPS; Take 1 capsule by mouth once a week. -     Cyanocobalamin (NASCOBAL) 500 MCG/0.1ML SOLN; Place 0.1 mLs (500 mcg total) into the nose once a week.   I have discontinued Ms. Bhalla's Vitamin B-12. I am also having her start on Vitamin D3 and Cyanocobalamin. Additionally, I am having her maintain her Prenatal Vit-Fe Fumarate-FA (PRENATAL VITAMIN PO), ferrous sulfate, phentermine, Vitamin D3, and triamterene-hydrochlorothiazide.  Meds ordered this encounter  Medications  . Cholecalciferol (VITAMIN D3) 50000 units CAPS    Sig: Take 1 capsule by mouth once a week.    Dispense:  8 capsule    Refill:  0  . Cyanocobalamin (NASCOBAL) 500 MCG/0.1ML SOLN    Sig: Place 0.1 mLs (500 mcg total) into the nose once a week.    Dispense:  1.3 mL    Refill:  11     Follow-up: No Follow-up on file.  Walker Kehr, MD

## 2016-09-25 ENCOUNTER — Ambulatory Visit (INDEPENDENT_AMBULATORY_CARE_PROVIDER_SITE_OTHER): Payer: 59 | Admitting: Internal Medicine

## 2016-09-25 ENCOUNTER — Encounter: Payer: Self-pay | Admitting: Internal Medicine

## 2016-09-25 ENCOUNTER — Other Ambulatory Visit (INDEPENDENT_AMBULATORY_CARE_PROVIDER_SITE_OTHER): Payer: 59

## 2016-09-25 ENCOUNTER — Other Ambulatory Visit: Payer: Self-pay | Admitting: Internal Medicine

## 2016-09-25 DIAGNOSIS — L299 Pruritus, unspecified: Secondary | ICD-10-CM

## 2016-09-25 DIAGNOSIS — L509 Urticaria, unspecified: Secondary | ICD-10-CM | POA: Diagnosis not present

## 2016-09-25 LAB — TSH: TSH: 0.45 u[IU]/mL (ref 0.35–4.50)

## 2016-09-25 LAB — URINALYSIS
Bilirubin Urine: NEGATIVE
Hgb urine dipstick: NEGATIVE
Ketones, ur: NEGATIVE
Leukocytes, UA: NEGATIVE
Nitrite: NEGATIVE
Specific Gravity, Urine: 1.015 (ref 1.000–1.030)
Total Protein, Urine: NEGATIVE
Urine Glucose: NEGATIVE
Urobilinogen, UA: 1 (ref 0.0–1.0)
pH: 8 (ref 5.0–8.0)

## 2016-09-25 LAB — CBC WITH DIFFERENTIAL/PLATELET
Basophils Absolute: 0.1 10*3/uL (ref 0.0–0.1)
Basophils Relative: 1.2 % (ref 0.0–3.0)
Eosinophils Absolute: 0.2 10*3/uL (ref 0.0–0.7)
Eosinophils Relative: 3.4 % (ref 0.0–5.0)
HCT: 39.6 % (ref 36.0–46.0)
Hemoglobin: 13.1 g/dL (ref 12.0–15.0)
Lymphocytes Relative: 30.9 % (ref 12.0–46.0)
Lymphs Abs: 1.9 10*3/uL (ref 0.7–4.0)
MCHC: 33.1 g/dL (ref 30.0–36.0)
MCV: 94.5 fl (ref 78.0–100.0)
Monocytes Absolute: 0.4 10*3/uL (ref 0.1–1.0)
Monocytes Relative: 6.4 % (ref 3.0–12.0)
Neutro Abs: 3.5 10*3/uL (ref 1.4–7.7)
Neutrophils Relative %: 58.1 % (ref 43.0–77.0)
Platelets: 322 10*3/uL (ref 150.0–400.0)
RBC: 4.2 Mil/uL (ref 3.87–5.11)
RDW: 13.2 % (ref 11.5–15.5)
WBC: 6 10*3/uL (ref 4.0–10.5)

## 2016-09-25 LAB — HEPATIC FUNCTION PANEL
ALT: 9 U/L (ref 0–35)
AST: 10 U/L (ref 0–37)
Albumin: 3.5 g/dL (ref 3.5–5.2)
Alkaline Phosphatase: 39 U/L (ref 39–117)
Bilirubin, Direct: 0.2 mg/dL (ref 0.0–0.3)
Total Bilirubin: 0.9 mg/dL (ref 0.2–1.2)
Total Protein: 6.4 g/dL (ref 6.0–8.3)

## 2016-09-25 LAB — BASIC METABOLIC PANEL
BUN: 12 mg/dL (ref 6–23)
CO2: 30 mEq/L (ref 19–32)
Calcium: 9 mg/dL (ref 8.4–10.5)
Chloride: 107 mEq/L (ref 96–112)
Creatinine, Ser: 0.59 mg/dL (ref 0.40–1.20)
GFR: 145.71 mL/min (ref >60.00)
Glucose, Bld: 111 mg/dL — ABNORMAL HIGH (ref 70–99)
Potassium: 4 mEq/L (ref 3.5–5.1)
Sodium: 143 mEq/L (ref 135–145)

## 2016-09-25 MED ORDER — LORATADINE 10 MG PO TABS: 10.0000 mg | ORAL_TABLET | Freq: Every day | ORAL | 3 refills | Status: DC

## 2016-09-25 NOTE — Assessment & Plan Note (Addendum)
Discussed hives Loratidine  Labs Gluten free trial (no wheat products) for 4-6 weeks. OK to use gluten-free bread and gluten-free pasta.  Milk free trial (no milk, ice cream, cheese and yogurt) for 4-6 weeks. OK to use almond, coconut, rice or soy milk. "Almond breeze" brand tastes good.

## 2016-09-25 NOTE — Assessment & Plan Note (Signed)
Labs Claritin qd Doubt infestations Discussed

## 2016-09-25 NOTE — Progress Notes (Signed)
Subjective:  Patient ID: Bridget [name], female    DOB: 09-May-1975  Age: 41 y.o. MRN: [id number]  CC: No chief complaint on file.   HPI Bridget [name] presents for hives off and on x30 min han gone. She is worried about co-workers having pet hair on them and ?parasites, fleas. She works for [employer]. Co-workers do not bring pets to the office. Dtr w/eczema  Outpatient Medications Prior to Visit  Medication Sig Dispense Refill  . Cholecalciferol (VITAMIN D3) 2000 units capsule Take 1 capsule (2,000 Units total) by mouth daily. 100 capsule 3  . Cholecalciferol (VITAMIN D3) 50000 units CAPS Take 1 capsule by mouth once a week. 8 capsule 0  . Cyanocobalamin (NASCOBAL) 500 MCG/0.1ML SOLN Place 0.1 mLs (500 mcg total) into the nose once a week. 1.3 mL 11  . ferrous sulfate 325 (65 FE) MG tablet Take 1 tablet (325 mg total) by mouth daily. 30 tablet 3  . phentermine (ADIPEX-P) 37.5 MG tablet Take 1 tablet (37.5 mg total) by mouth daily before breakfast. 30 tablet 2  . Prenatal Vit-Fe Fumarate-FA (PRENATAL VITAMIN PO) Take 1 tablet by mouth daily.     Marland Kitchen triamterene-hydrochlorothiazide (MAXZIDE-25) 37.5-25 MG tablet Take 1 tablet by mouth daily. 30 tablet 11   No facility-administered medications prior to visit.     ROS Review of Systems  Constitutional: Negative for activity change, appetite change, chills, fatigue and unexpected weight change.  HENT: Negative for congestion, mouth sores and sinus pressure.   Eyes: Negative for visual disturbance.  Respiratory: Negative for cough and chest tightness.   Gastrointestinal: Negative for abdominal pain and nausea.  Genitourinary: Negative for difficulty urinating, frequency and vaginal pain.  Musculoskeletal: Negative for back pain and gait problem.  Skin: Positive for rash. Negative for pallor.  Neurological: Negative for dizziness, tremors, weakness, numbness and headaches.  Psychiatric/Behavioral: Negative for confusion and sleep disturbance.     Objective:  BP 128/76 (BP Location: Left Arm, Patient Position: Sitting, Cuff Size: Large)   Pulse 72   Temp 99 F (37.2 C) (Oral)   Ht 5\' 5"  (1.651 m)   Wt (!) 324 lb (147 kg)   SpO2 98%   BMI 53.92 kg/m   BP Readings from Last 3 Encounters:  09/25/16 128/76  04/23/16 134/76  03/23/16 135/85    Wt Readings from Last 3 Encounters:  09/25/16 (!) 324 lb (147 kg)  04/23/16 298 lb 8 oz (135.4 kg)  03/20/16 (!) 354 lb (160.6 kg)    Physical Exam  Constitutional: She appears well-developed. No distress.  HENT:  Head: Normocephalic.  Right Ear: External ear normal.  Left Ear: External ear normal.  Nose: Nose normal.  Mouth/Throat: Oropharynx is clear and moist.  Eyes: Conjunctivae are normal. Pupils are equal, round, and reactive to light. Right eye exhibits no discharge. Left eye exhibits no discharge.  Neck: Normal range of motion. Neck supple. No JVD present. No tracheal deviation present. No thyromegaly present.  Cardiovascular: Normal rate, regular rhythm and normal heart sounds.   Pulmonary/Chest: No stridor. No respiratory distress. She has no wheezes.  Abdominal: Soft. Bowel sounds are normal. She exhibits no distension and no mass. There is no tenderness. There is no rebound and no guarding.  Musculoskeletal: She exhibits no edema or tenderness.  Lymphadenopathy:    She has no cervical adenopathy.  Neurological: She displays normal reflexes. No cranial nerve deficit. She exhibits normal muscle tone. Coordination normal.  Skin: Rash noted. No erythema.  Hives on  phone pictures - forearms  Psychiatric: She has a normal mood and affect. Her behavior is normal. Judgment and thought content normal.  obese  Lab Results  Component Value Date   WBC 6.0 09/25/2016   HGB 13.1 09/25/2016   HCT 39.6 09/25/2016   PLT 322.0 09/25/2016   GLUCOSE 111 (H) 09/25/2016   CHOL 135 07/11/2015   TRIG 108.0 07/11/2015   HDL 37.80 (L) 07/11/2015   LDLCALC 76 07/11/2015   ALT 9  09/25/2016   AST 10 09/25/2016   NA 143 09/25/2016   K 4.0 09/25/2016   CL 107 09/25/2016   CREATININE 0.59 09/25/2016   BUN 12 09/25/2016   CO2 30 09/25/2016   TSH 0.45 09/25/2016   HGBA1C 5.3 05/06/2014    [place] Mfm Ob Limited  Result Date: 03/20/2016 OBSTETRICAL ULTRASOUND: This exam was performed within a [facility] Ultrasound Department. The OB US report was generated in the AS system, and faxed to the ordering physician.  This report is available in the [facility]. See the AS Obstetric US report via the Image Link.   Assessment & Plan:   Diagnoses and all orders for this visit:  Pruritic condition -     Allergy profile region II-DC, [place], [place], [place], [place]; Future -     Allergen food profile specific IgE; Future -     Basic metabolic panel; Future -     CBC with Differential/Platelet; Future -     Hepatic function panel; Future -     TSH; Future -     Urinalysis; Future  Other orders -     loratadine (CLARITIN) 10 MG tablet; Take 1 tablet (10 mg total) by mouth daily.   I am having Ms. Manseau start on loratadine. I am also having her maintain her Prenatal Vit-Fe Fumarate-FA (PRENATAL VITAMIN PO), ferrous sulfate, phentermine, Vitamin D3, triamterene-hydrochlorothiazide, Vitamin D3, and Cyanocobalamin.  Meds ordered this encounter  Medications  . loratadine (CLARITIN) 10 MG tablet    Sig: Take 1 tablet (10 mg total) by mouth daily.    Dispense:  100 tablet    Refill:  3     Follow-up: No Follow-up on file.  Walker Kehr, MD

## 2016-09-25 NOTE — Assessment & Plan Note (Signed)
Advised to see Dr Leafy Ro

## 2016-09-25 NOTE — Patient Instructions (Signed)
Gluten free trial (no wheat products) for 4-6 weeks. OK to use gluten-free bread and gluten-free pasta.  Milk free trial (no milk, ice cream, cheese and yogurt) for 4-6 weeks. OK to use almond, coconut, rice or soy milk. "Almond breeze" brand tastes good.  

## 2016-09-26 LAB — RESPIRATORY ALLERGY PROFILE REGION II ~~LOC~~
Allergen, A. alternata, m6: 1.47 kU/L — ABNORMAL HIGH
Allergen, C. Herbarum, M2: <0.1 kU/L
Allergen, Cedar tree, t12: 0.36 kU/L — ABNORMAL HIGH
Allergen, Comm Silver Birch, t9: 0.1 kU/L — ABNORMAL HIGH
Allergen, Cottonwood, t14: 0.11 kU/L — ABNORMAL HIGH
Allergen, D pternoyssinus,d7: <0.1 kU/L
Allergen, Mouse Urine Protein, e78: <0.1 kU/L
Allergen, Mulberry, t76: <0.1 kU/L
Allergen, Oak,t7: <0.1 kU/L
Allergen, P. notatum, m1: <0.1 kU/L
Aspergillus fumigatus, m3: 0.13 kU/L — ABNORMAL HIGH
Bermuda Grass: 0.17 kU/L — ABNORMAL HIGH
Box Elder IgE: 0.69 kU/L — ABNORMAL HIGH
Cat Dander: <0.1 kU/L
Cockroach: 0.33 kU/L — ABNORMAL HIGH
Common Ragweed: <0.1 kU/L
D. farinae: <0.1 kU/L
Dog Dander: <0.1 kU/L
Elm IgE: 0.1 kU/L — ABNORMAL HIGH
IgE (Immunoglobulin E), Serum: 146 kU/L — ABNORMAL HIGH (ref <115)
Johnson Grass: 1.75 kU/L — ABNORMAL HIGH
Pecan/Hickory Tree IgE: 1.52 kU/L — ABNORMAL HIGH
Rough Pigweed  IgE: <0.1 kU/L
Sheep Sorrel IgE: <0.1 kU/L
Timothy Grass: 3 kU/L — ABNORMAL HIGH

## 2016-09-27 LAB — ALLERGEN FOOD PROFILE SPECIFIC IGE
Allergen Apple, IgE: <0.1 kU/L
Allergen Corn, IgE: <0.1 kU/L
Allergen Tomato, IgE: <0.1 kU/L
Chicken IgE: <0.1 kU/L
Codfish IgE: <0.1 kU/L
Egg White IgE: <0.1 kU/L
IgE (Immunoglobulin E), Serum: 178 IU/mL — ABNORMAL HIGH (ref 0–100)
Milk IgE: <0.1 kU/L
Orange: <0.1 kU/L
Peanut IgE: <0.1 kU/L
Shrimp IgE: 0.25 kU/L — AB
Soybean IgE: <0.1 kU/L
Tuna: <0.1 kU/L
Wheat IgE: <0.1 kU/L

## 2017-07-30 ENCOUNTER — Other Ambulatory Visit: Payer: Self-pay | Admitting: Internal Medicine

## 2017-07-30 MED ORDER — TRIAMTERENE-HCTZ 37.5-25 MG PO TABS: 1.0000 | ORAL_TABLET | Freq: Every day | ORAL | 0 refills | Status: DC

## 2017-07-30 NOTE — Telephone Encounter (Signed)
Rx refill request: Maxide-25            Last filled: 04/23/16 #30  LOV: 09/25/16   PCP: Plotnikov  Pharmacy: [facility]

## 2017-07-30 NOTE — Telephone Encounter (Signed)
Copied from [place] [id number]. Topic: Quick Communication - Rx Refill/Question >> Jul 30, 2017  9:17 AM Carolyn Stare wrote: Medication     triamterene-hydrochlorothiazide (MAXZIDE-25) 37.5-25 MG tablet       pt has been scheduled for her physical and need refill till her appt in July    Has the patient contacted their pharmacy yes    Preferred Pharmacy  [facility] [address]     Agent: Please be advised that RX refills may take up to 3 business days. We ask that you follow-up with your pharmacy.

## 2017-09-19 ENCOUNTER — Telehealth: Payer: Self-pay | Admitting: Internal Medicine

## 2017-09-19 MED ORDER — TRIAMTERENE-HCTZ 37.5-25 MG PO TABS: 1.0000 | ORAL_TABLET | Freq: Every day | ORAL | 0 refills | Status: DC

## 2017-09-19 NOTE — Telephone Encounter (Signed)
Copied from [place] [id number]. Topic: Quick Communication - Rx Refill/Question >> Sep 19, 2017 11:06 AM Oliver Pila B wrote: Medication: triamterene-hydrochlorothiazide (BCWUGQB-16) 37.5-25 MG tablet [[id number]]   Has the patient contacted their pharmacy? Yes.   (Agent: If no, request that the patient contact the pharmacy for the refill.) (Agent: If yes, when and what did the pharmacy advise?)  Preferred Pharmacy (with phone number [facility] street name): [facility]  Agent: Please be advised that RX refills may take up to 3 business days. We ask that you follow-up with your pharmacy.

## 2017-09-19 NOTE — Telephone Encounter (Signed)
Maxzide-25   37.5-25 mg refill request  LOV 09/25/16 with Dr. Alain Marion.  Last refill:  07/30/17  #30  0 refills  Has upcoming appt on 09/26/17  [facility] - [place], [place]

## 2017-09-19 NOTE — Telephone Encounter (Signed)
Done. See meds.  

## 2017-09-26 ENCOUNTER — Encounter: Payer: 59 | Admitting: Internal Medicine

## 2017-09-26 DIAGNOSIS — Z0289 Encounter for other administrative examinations: Secondary | ICD-10-CM

## 2017-10-17 ENCOUNTER — Encounter: Payer: 59 | Admitting: Internal Medicine

## 2017-10-20 ENCOUNTER — Encounter: Payer: 59 | Admitting: Internal Medicine

## 2017-10-29 ENCOUNTER — Ambulatory Visit (INDEPENDENT_AMBULATORY_CARE_PROVIDER_SITE_OTHER): Payer: 59 | Admitting: Internal Medicine

## 2017-10-29 ENCOUNTER — Encounter: Payer: Self-pay | Admitting: Internal Medicine

## 2017-10-29 ENCOUNTER — Other Ambulatory Visit (INDEPENDENT_AMBULATORY_CARE_PROVIDER_SITE_OTHER): Payer: 59

## 2017-10-29 VITALS — BP 130/72 | HR 78 | Temp 98.2°F | Ht 65.0 in | Wt 328.0 lb

## 2017-10-29 DIAGNOSIS — R739 Hyperglycemia, unspecified: Secondary | ICD-10-CM | POA: Diagnosis not present

## 2017-10-29 DIAGNOSIS — E538 Deficiency of other specified B group vitamins: Secondary | ICD-10-CM

## 2017-10-29 DIAGNOSIS — G43109 Migraine with aura, not intractable, without status migrainosus: Secondary | ICD-10-CM | POA: Diagnosis not present

## 2017-10-29 DIAGNOSIS — F329 Major depressive disorder, single episode, unspecified: Secondary | ICD-10-CM | POA: Diagnosis not present

## 2017-10-29 DIAGNOSIS — E559 Vitamin D deficiency, unspecified: Secondary | ICD-10-CM

## 2017-10-29 DIAGNOSIS — I10 Essential (primary) hypertension: Secondary | ICD-10-CM

## 2017-10-29 LAB — BASIC METABOLIC PANEL
BUN: 13 mg/dL (ref 6–23)
CO2: 31 mEq/L (ref 19–32)
Calcium: 9.5 mg/dL (ref 8.4–10.5)
Chloride: 101 mEq/L (ref 96–112)
Creatinine, Ser: 0.82 mg/dL (ref 0.40–1.20)
GFR: 99.1 mL/min (ref >60.00)
Glucose, Bld: 110 mg/dL — ABNORMAL HIGH (ref 70–99)
Potassium: 3.5 mEq/L (ref 3.5–5.1)
Sodium: 138 mEq/L (ref 135–145)

## 2017-10-29 LAB — LIPID PANEL
Cholesterol: 152 mg/dL (ref 0–200)
HDL: 38.5 mg/dL — ABNORMAL LOW (ref >39.00)
LDL Cholesterol: 103 mg/dL — ABNORMAL HIGH (ref 0–99)
NonHDL: 113.97
Total CHOL/HDL Ratio: 4
Triglycerides: 56 mg/dL (ref 0.0–149.0)
VLDL: 11.2 mg/dL (ref 0.0–40.0)

## 2017-10-29 LAB — HEPATIC FUNCTION PANEL
ALT: 10 U/L (ref 0–35)
AST: 12 U/L (ref 0–37)
Albumin: 4.2 g/dL (ref 3.5–5.2)
Alkaline Phosphatase: 43 U/L (ref 39–117)
Bilirubin, Direct: 0.2 mg/dL (ref 0.0–0.3)
Total Bilirubin: 1 mg/dL (ref 0.2–1.2)
Total Protein: 7.6 g/dL (ref 6.0–8.3)

## 2017-10-29 LAB — CBC WITH DIFFERENTIAL/PLATELET
Basophils Absolute: 0.1 10*3/uL (ref 0.0–0.1)
Basophils Relative: 0.9 % (ref 0.0–3.0)
Eosinophils Absolute: 0.2 10*3/uL (ref 0.0–0.7)
Eosinophils Relative: 3.2 % (ref 0.0–5.0)
HCT: 42 % (ref 36.0–46.0)
Hemoglobin: 14.1 g/dL (ref 12.0–15.0)
Lymphocytes Relative: 28 % (ref 12.0–46.0)
Lymphs Abs: 2 10*3/uL (ref 0.7–4.0)
MCHC: 33.5 g/dL (ref 30.0–36.0)
MCV: 95.1 fl (ref 78.0–100.0)
Monocytes Absolute: 0.4 10*3/uL (ref 0.1–1.0)
Monocytes Relative: 6.1 % (ref 3.0–12.0)
Neutro Abs: 4.4 10*3/uL (ref 1.4–7.7)
Neutrophils Relative %: 61.8 % (ref 43.0–77.0)
Platelets: 371 10*3/uL (ref 150.0–400.0)
RBC: 4.42 Mil/uL (ref 3.87–5.11)
RDW: 13.4 % (ref 11.5–15.5)
WBC: 7.1 10*3/uL (ref 4.0–10.5)

## 2017-10-29 LAB — VITAMIN B12: Vitamin B-12: 235 pg/mL (ref 211–911)

## 2017-10-29 LAB — HEMOGLOBIN A1C: Hgb A1c MFr Bld: 5.3 % (ref 4.6–6.5)

## 2017-10-29 LAB — TSH: TSH: 0.59 u[IU]/mL (ref 0.35–4.50)

## 2017-10-29 MED ORDER — ONDANSETRON HCL 4 MG PO TABS: 4.0000 mg | ORAL_TABLET | Freq: Three times a day (TID) | ORAL | 1 refills | Status: DC | PRN

## 2017-10-29 MED ORDER — TRIAMTERENE-HCTZ 37.5-25 MG PO TABS: 1.0000 | ORAL_TABLET | Freq: Every day | ORAL | 11 refills | Status: DC

## 2017-10-29 MED ORDER — RIZATRIPTAN BENZOATE 10 MG PO TABS: 10.0000 mg | ORAL_TABLET | Freq: Once | ORAL | 5 refills | Status: DC | PRN

## 2017-10-29 MED ORDER — ESCITALOPRAM OXALATE 5 MG PO TABS: 5.0000 mg | ORAL_TABLET | Freq: Every day | ORAL | 5 refills | Status: DC

## 2017-10-29 NOTE — Progress Notes (Signed)
Subjective:  Patient ID: Bridget [name], female    DOB: [birth year]  Age: 42 y.o. MRN: [id number]  CC: No chief complaint on file.   HPI Bridget [name] presents for migraines (2 years) 2-4 per week. They are severe and w/nausea. Excedrin helped. 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 Cholecalciferol (VITAMIN D3) 2000 units capsule Take 1 capsule (2,000 Units total) by mouth daily. 100 capsule 3  . Cholecalciferol (VITAMIN D3) 50000 units CAPS Take 1 capsule by mouth once a week. 8 capsule 0  . Cyanocobalamin (NASCOBAL) 500 MCG/0.1ML SOLN Place 0.1 mLs (500 mcg total) into the nose once a week. 1.3 mL 11  . loratadine (CLARITIN) 10 MG tablet Take 1 tablet (10 mg total) by mouth daily. 100 tablet 3  . phentermine (ADIPEX-P) 37.5 MG tablet Take 1 tablet (37.5 mg total) by mouth daily before breakfast. 30 tablet 2  . Prenatal Vit-Fe Fumarate-FA (PRENATAL VITAMIN PO) Take 1 tablet by mouth daily.     Marland Kitchen triamterene-hydrochlorothiazide (MAXZIDE-25) 37.5-25 MG tablet Take 1 tablet by mouth daily. Overdue for annual appt must see MD for future refills 30 tablet 0  . ferrous sulfate 325 (65 FE) MG tablet Take 1 tablet (325 mg total) by mouth daily. 30 tablet 3   No facility-administered medications prior to visit.     ROS: Review of Systems  Constitutional: Positive for fatigue. Negative for activity change, appetite change, chills and unexpected weight change.  HENT: Negative for congestion, mouth sores and sinus pressure.   Eyes: Negative for visual disturbance.  Respiratory: Negative for cough and chest tightness.   Gastrointestinal: Positive for nausea. Negative for abdominal pain.  Genitourinary: Negative for difficulty urinating, frequency and vaginal pain.  Musculoskeletal: Negative for back pain and gait problem.  Skin: Negative for pallor and rash.  Neurological:  Positive for headaches. Negative for dizziness, tremors, weakness and numbness.  Psychiatric/Behavioral: Positive for dysphoric mood. Negative for confusion, sleep disturbance and suicidal ideas. The patient is nervous/anxious.     Objective:  BP 130/72 (BP Location: Left Arm, Patient Position: Sitting, Cuff Size: Large)   Pulse 78   Temp 98.2 F (36.8 C) (Oral)   Ht 5\' 5"  (1.651 m)   Wt (!) 328 lb (148.8 kg)   SpO2 97%   BMI 54.58 kg/m   BP Readings from Last 3 Encounters:  10/29/17 130/72  09/25/16 128/76  04/23/16 134/76    Wt Readings from Last 3 Encounters:  10/29/17 (!) 328 lb (148.8 kg)  09/25/16 (!) 324 lb (147 kg)  04/23/16 298 lb 8 oz (135.4 kg)    Physical Exam  Constitutional: She appears well-developed. No distress.  HENT:  Head: Normocephalic.  Right Ear: External ear normal.  Left Ear: External ear normal.  Nose: Nose normal.  Mouth/Throat: Oropharynx is clear and moist.  Eyes: Pupils are equal, round, and reactive to light. Conjunctivae are normal. Right eye exhibits no discharge. Left eye exhibits no discharge.  Neck: Normal range of motion. Neck supple. No JVD present. No tracheal deviation present. No thyromegaly present.  Cardiovascular: Normal rate, regular rhythm and normal heart sounds.  Pulmonary/Chest: No stridor. No respiratory distress. She has no wheezes.  Abdominal: Soft. Bowel sounds are normal. She exhibits no distension and no mass. There is no tenderness. There is no rebound and no guarding.  Musculoskeletal: She exhibits no edema or tenderness.  Lymphadenopathy:    She has no cervical adenopathy.  Neurological: She displays normal reflexes. No cranial nerve deficit. She exhibits normal muscle tone. Coordination normal.  Skin: No rash noted. No erythema.  Psychiatric: She has a normal mood and affect. Her behavior is normal. Judgment and thought content normal.   Obese Tearful  Lab Results  Component Value Date   WBC 6.0 09/25/2016     HGB 13.1 09/25/2016   HCT 39.6 09/25/2016   PLT 322.0 09/25/2016   GLUCOSE 111 (H) 09/25/2016   CHOL 135 07/11/2015   TRIG 108.0 07/11/2015   HDL 37.80 (L) 07/11/2015   LDLCALC 76 07/11/2015   ALT 9 09/25/2016   AST 10 09/25/2016   NA 143 09/25/2016   K 4.0 09/25/2016   CL 107 09/25/2016   CREATININE 0.59 09/25/2016   BUN 12 09/25/2016   CO2 30 09/25/2016   TSH 0.45 09/25/2016   HGBA1C 5.3 05/06/2014    [place] Mfm Ob Limited  Result Date: 03/20/2016 OBSTETRICAL ULTRASOUND: This exam was performed within a [facility] Ultrasound Department. The OB US report was generated in the AS system, and faxed to the ordering physician.  This report is available in the [facility]. See the AS Obstetric US report via the Image Link.   Assessment & Plan:   There are no diagnoses linked to this encounter.   No orders of the defined types were placed in this encounter.    Follow-up: No follow-ups on file.  Walker Kehr, MD

## 2017-10-29 NOTE — Assessment & Plan Note (Signed)
Maxzide Labs 

## 2017-10-29 NOTE — Assessment & Plan Note (Signed)
Labs Risks associated with treatment noncompliance were discussed. Compliance was encouraged.  

## 2017-10-29 NOTE — Assessment & Plan Note (Addendum)
Labs Risks associated with treatment noncompliance were discussed. Compliance was encouraged.  

## 2017-10-29 NOTE — Assessment & Plan Note (Signed)
C/o stress - husband is in prison Lexapro

## 2017-10-29 NOTE — Assessment & Plan Note (Signed)
Maxalt Zofran Lexapro

## 2017-10-30 ENCOUNTER — Other Ambulatory Visit: Payer: Self-pay | Admitting: Internal Medicine

## 2017-10-30 LAB — VITAMIN D 25 HYDROXY (VIT D DEFICIENCY, FRACTURES): VITD: 7.28 ng/mL — ABNORMAL LOW (ref 30.00–100.00)

## 2017-10-30 LAB — IRON,TIBC AND FERRITIN PANEL
%SAT: 14 % (calc) — ABNORMAL LOW (ref 16–45)
Ferritin: 28 ng/mL (ref 16–154)
Iron: 49 ug/dL (ref 40–190)
TIBC: 338 mcg/dL (calc) (ref 250–450)

## 2017-10-30 MED ORDER — VITAMIN D3 1.25 MG (50000 UT) PO CAPS: 1.0000 | ORAL_CAPSULE | ORAL | 0 refills | Status: DC

## 2017-10-30 MED ORDER — VITAMIN B-12 1000 MCG PO TABS: 1000.0000 ug | ORAL_TABLET | Freq: Every day | ORAL | 3 refills | Status: DC

## 2017-11-16 IMAGING — US US OB COMP LESS 14 WK
1 series · 15 of 28 positions shown · non-contrast
Comparison: None.

CLINICAL DATA: Pregnant

EXAM:
OBSTETRIC <14 WK ULTRASOUND
TECHNIQUE: Transabdominal ultrasound was performed for evaluation of the
gestation as well as the maternal uterus and adnexal regions.

[Series 1: us ob comp less 14 wk · 15 of 46 slices shown]
[im 1/46]
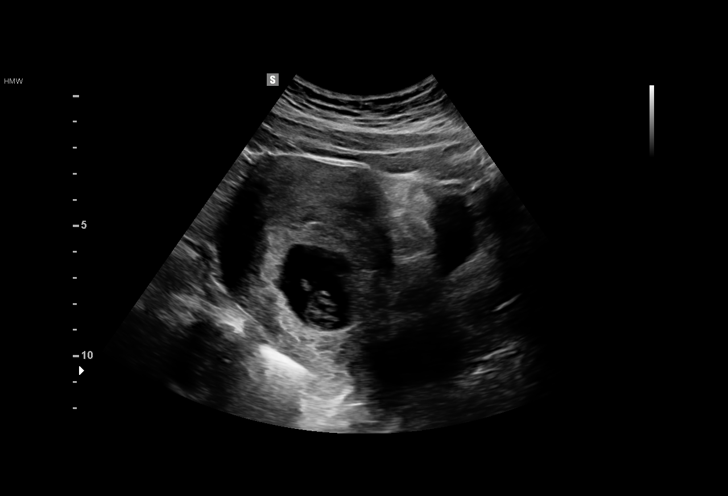
[im 4/46]
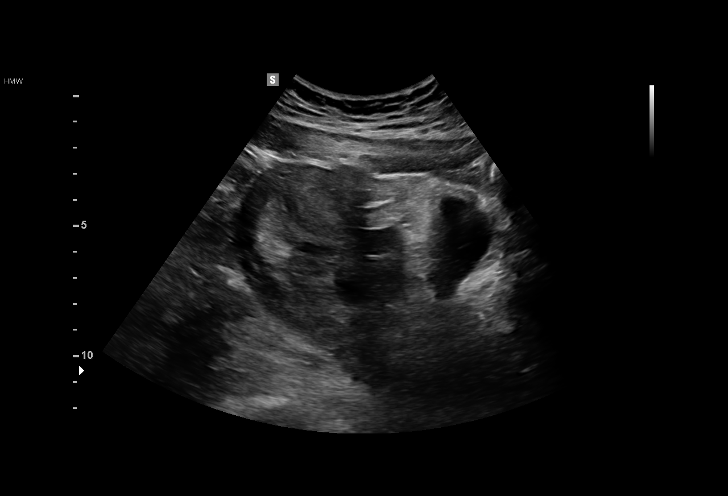
[im 7/46]
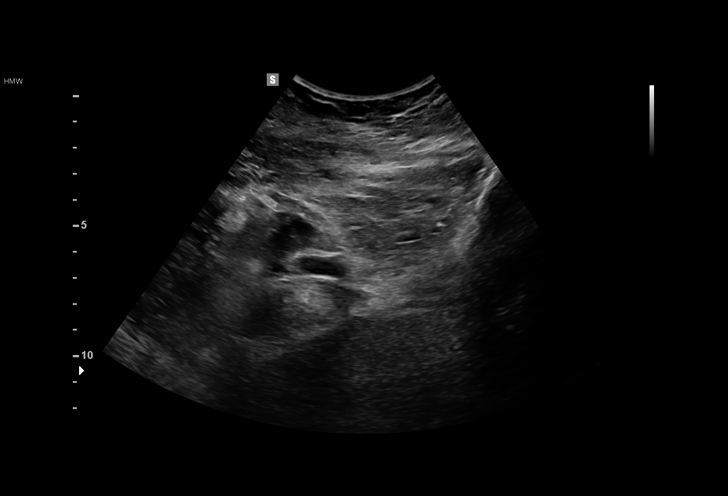
[im 11/46]
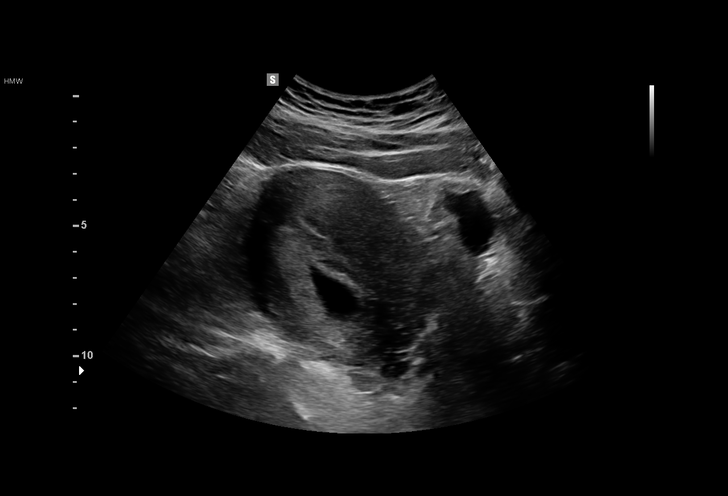
[im 14/46]
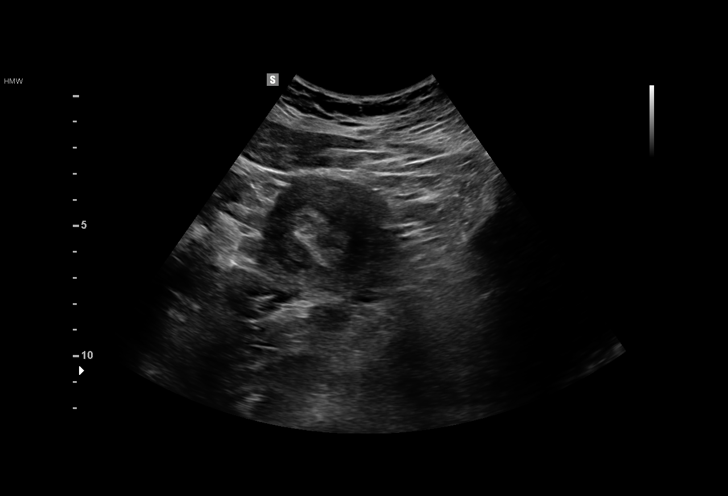
[im 17/46]
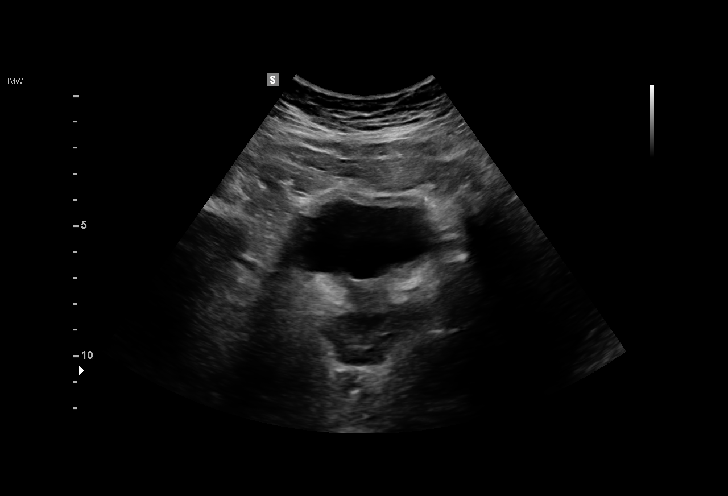
[im 21/46]
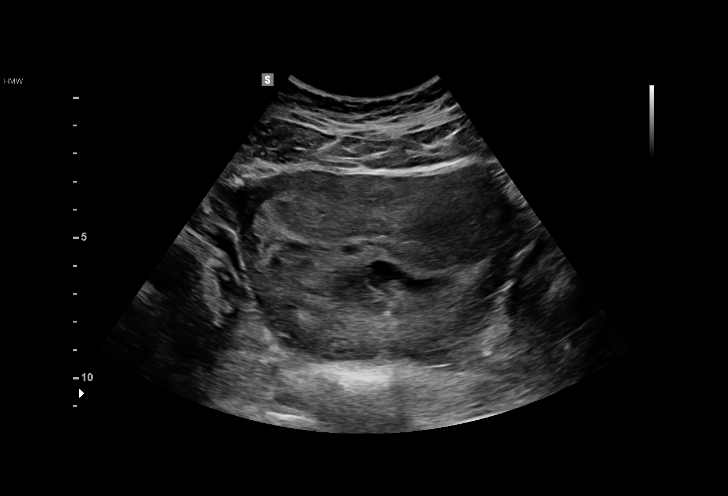
[im 24/46]
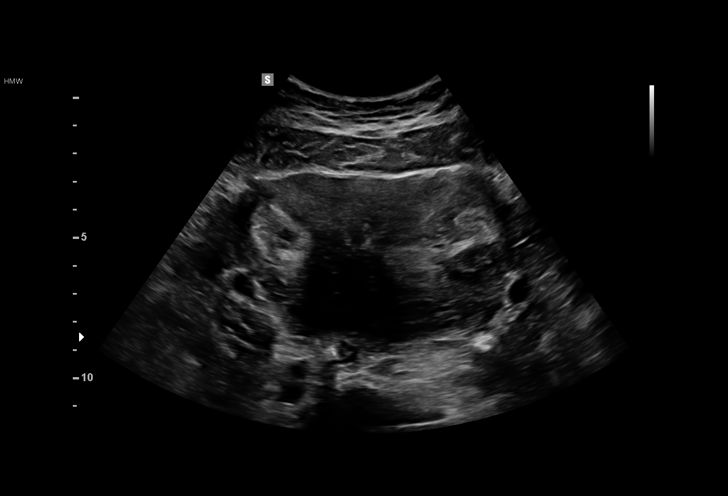
[im 26/46]
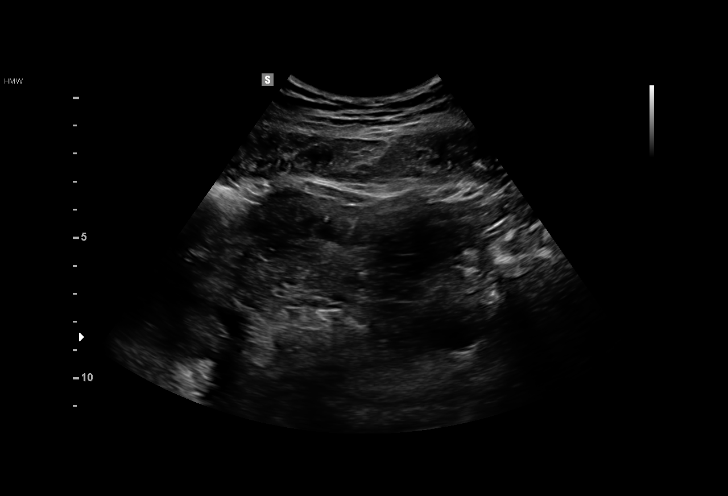
[im 29/46]
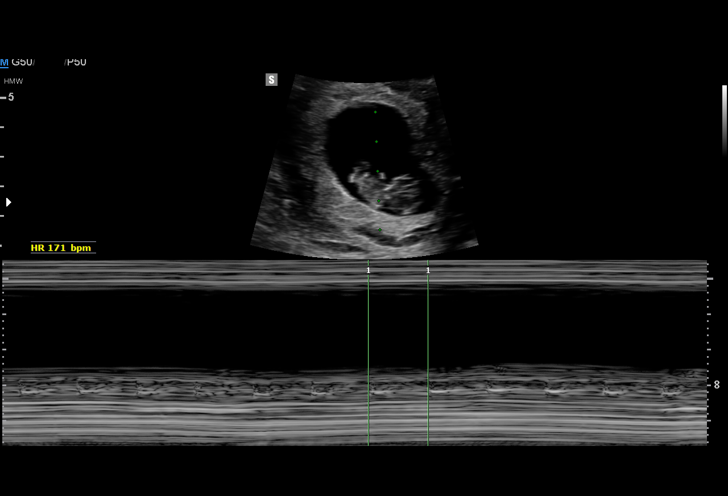
[im 32/46]
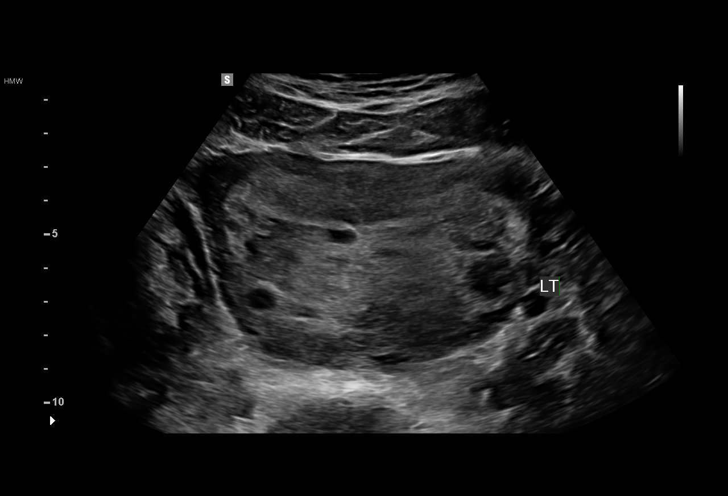
[im 36/46]
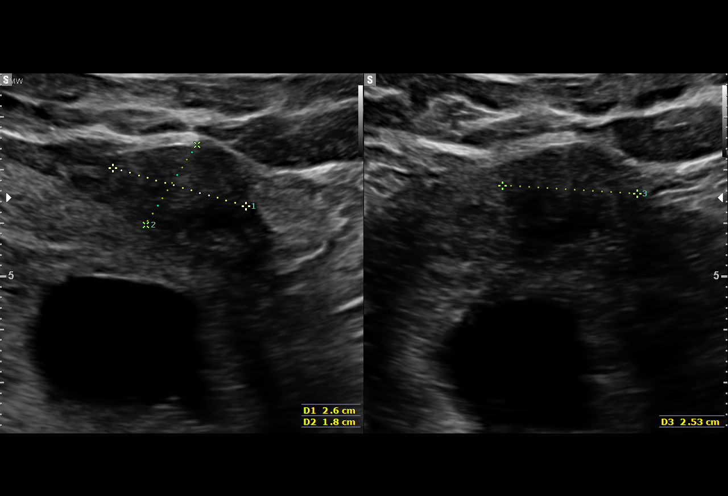
[im 39/46]
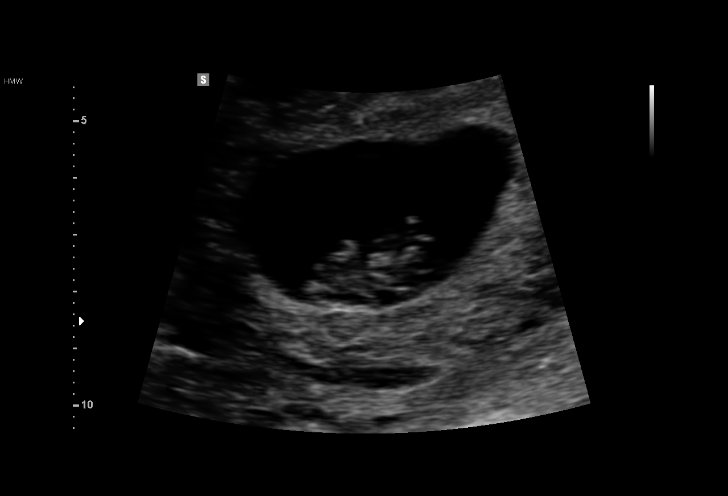
[im 42/46]
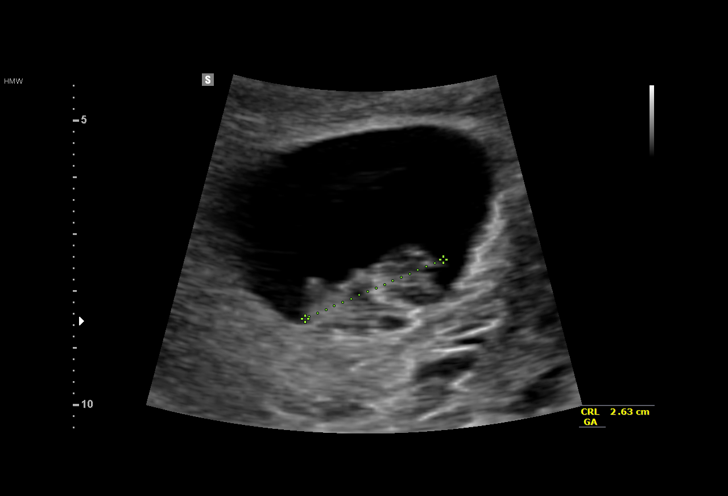
[im 46/46]
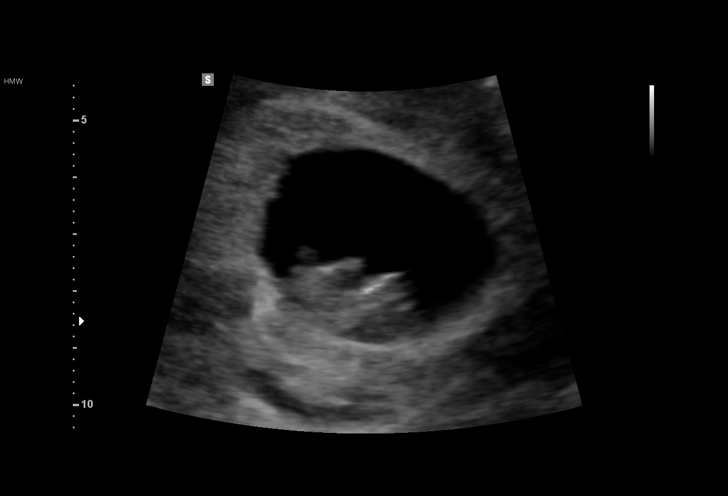

[15 of 28 positions shown; findings below may reference images not displayed]

FINDINGS: Intrauterine gestational sac: Single

Yolk sac:  Not visualized

Embryo:  Present

Cardiac Activity: Present

Heart Rate: 171 bpm

CRL:   26.2  mm   9 w 3 d                  US EDC: 03/24/2016

Subchorionic hemorrhage:  None visualized.

Maternal uterus/adnexae: Multiple uterine fibroids, including a
dominant 2.6 cm subserosal fibroid in the anterior uterine body.

Bilateral ovaries are not discretely visualized.

No free fluid.
IMPRESSION: Single live intrauterine gestation, measuring 9 weeks 3 days by
crown-rump length.

## 2018-02-02 IMAGING — US US RENAL
1 series · 15 of 25 positions shown · non-contrast
Comparison: None.

CLINICAL DATA: Left flank pain.  Hematuria

EXAM:
RENAL / URINARY TRACT ULTRASOUND COMPLETE

[Series 1: us renal · 15 of 37 slices shown]
[im 1/37]
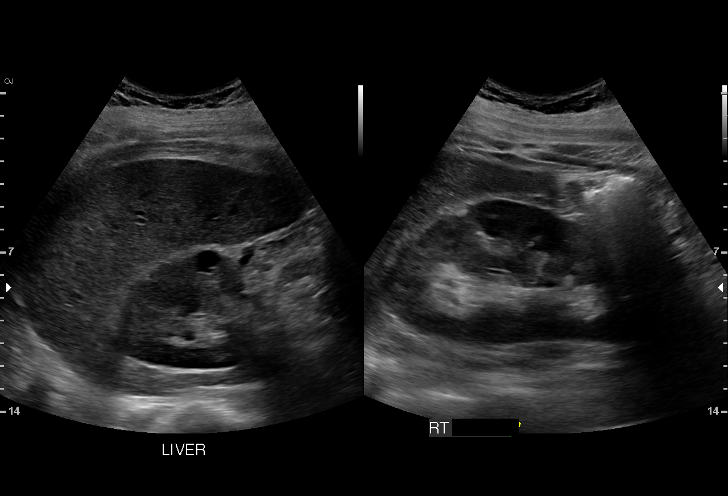
[im 4/37]
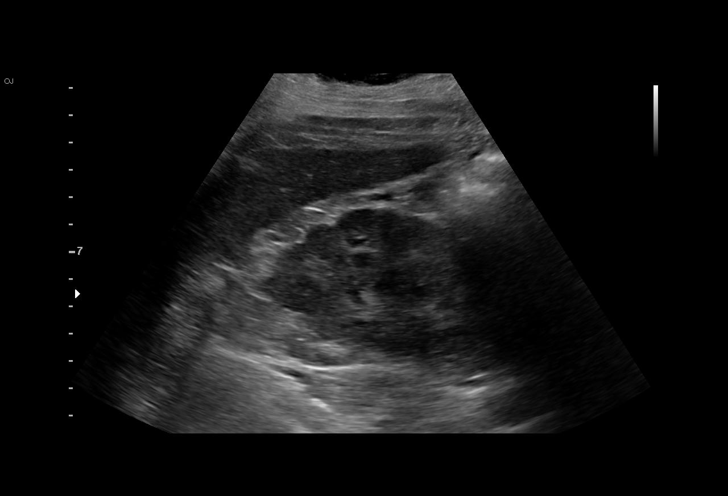
[im 7/37]
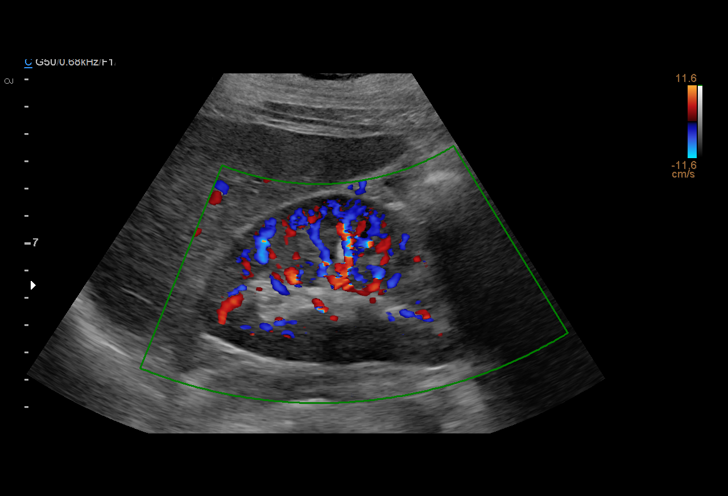
[im 8/37]
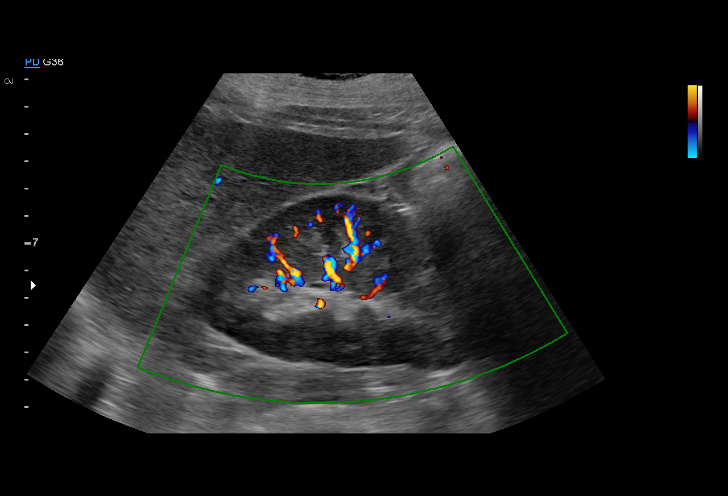
[im 11/37]
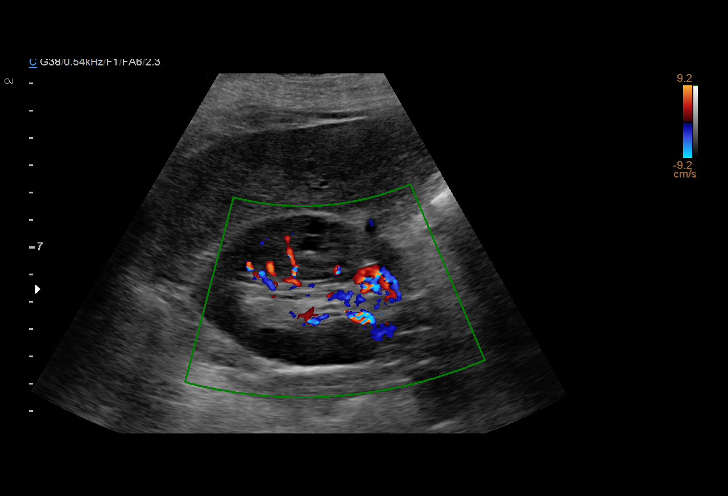
[im 14/37]
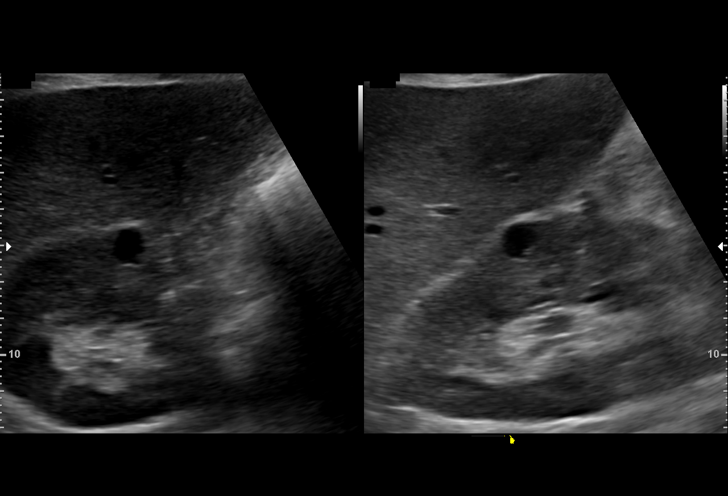
[im 16/37]
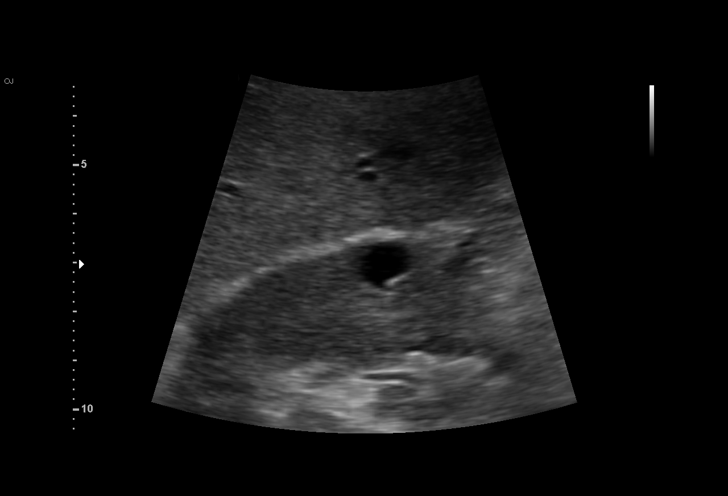
[im 19/37]
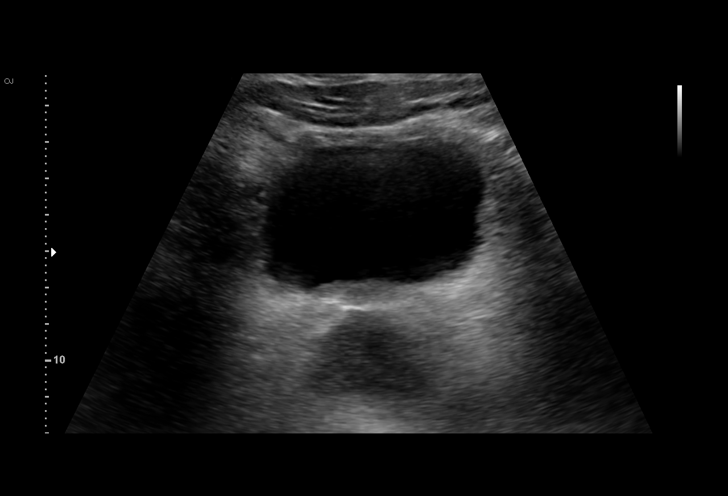
[im 22/37]
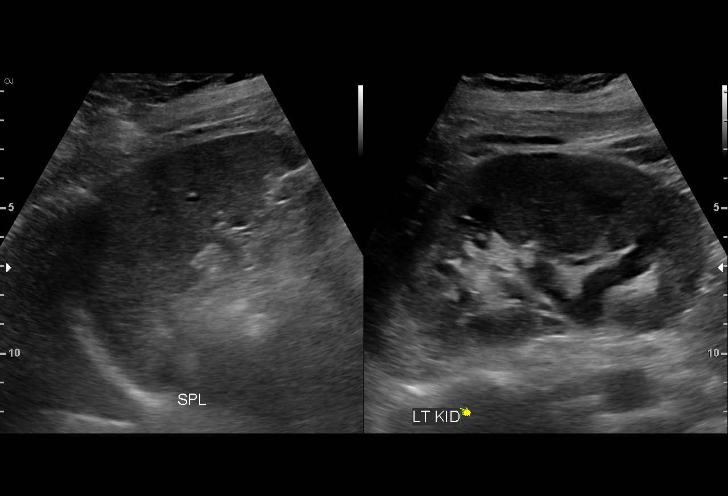
[im 23/37]
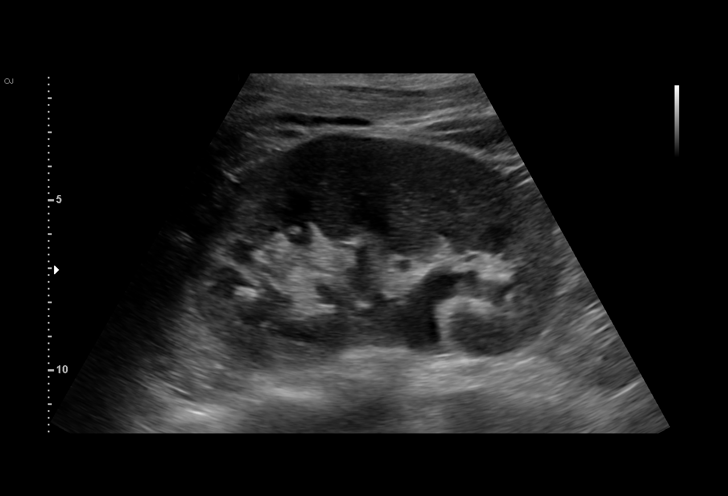
[im 26/37]
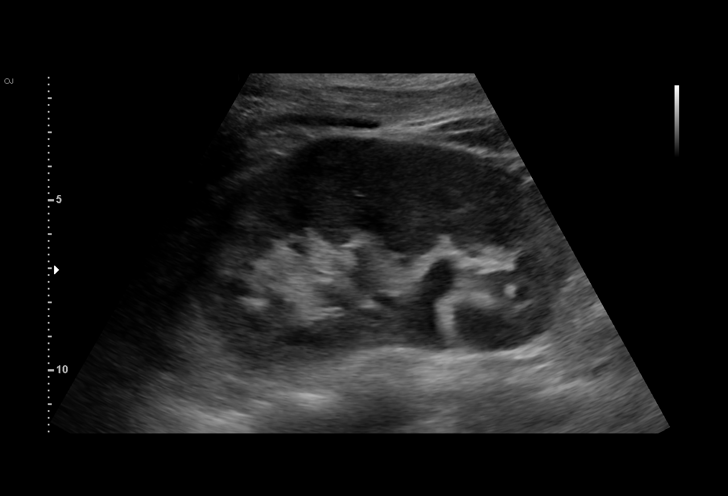
[im 29/37]
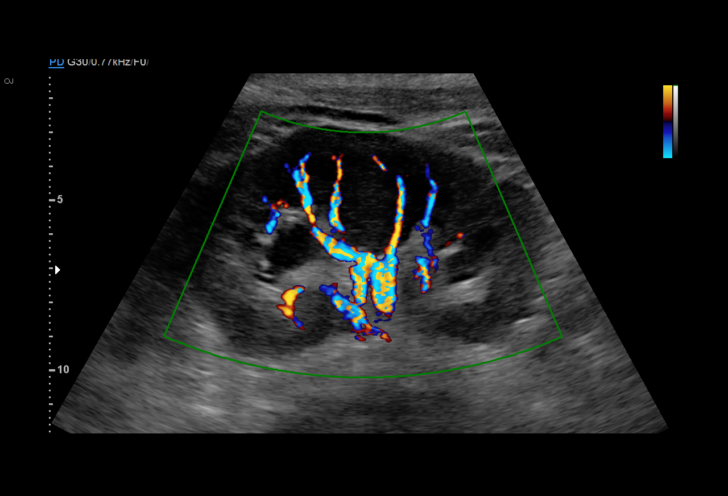
[im 31/37]
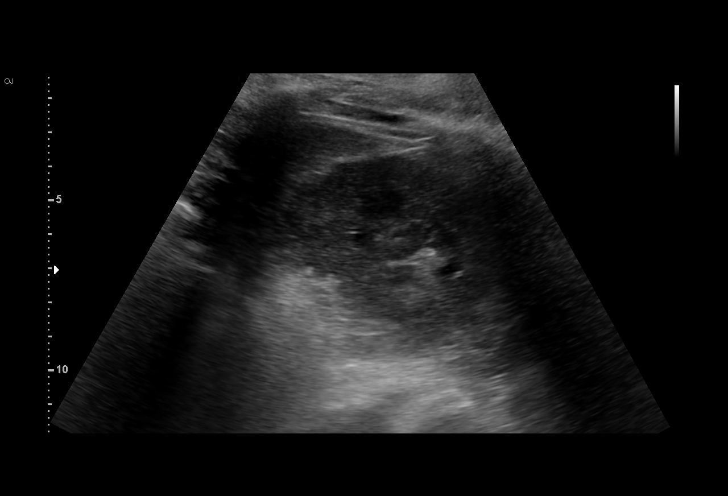
[im 34/37]
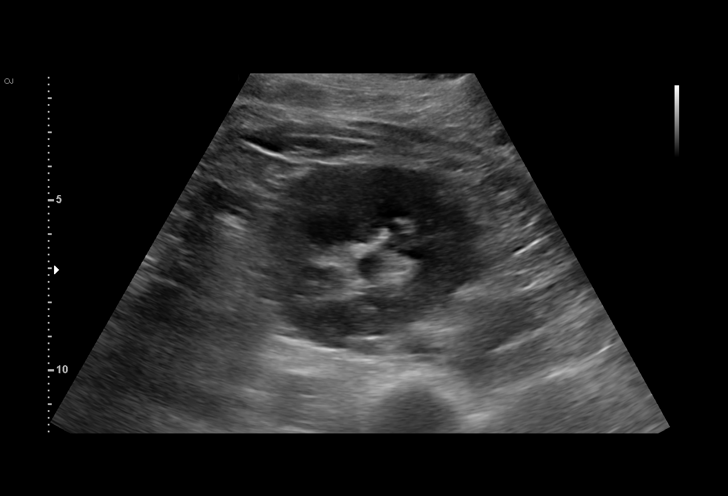
[im 37/37]
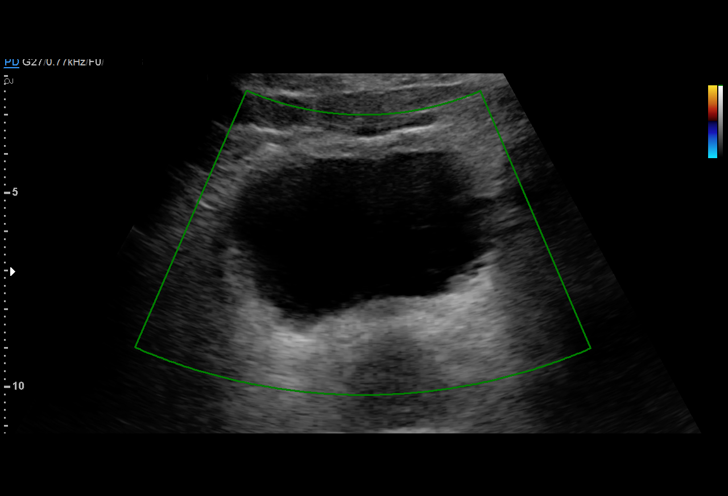

[15 of 25 positions shown; findings below may reference images not displayed]

FINDINGS: Right Kidney:

Length: 11.3 cm There is a cyst within the midpole measuring 9 x 8 x
7 mm. Echogenicity within normal limits. No mass or hydronephrosis
visualized.

Left Kidney:

Length: 11.7 cm. Echogenicity within normal limits. No mass or
hydronephrosis visualized. Left-sided pelvocaliectasis is likely
related to pregnancy.

Bladder:

Appears normal for degree of bladder distention.
IMPRESSION: 1. No obstructive uropathy.  No kidney stones identified.
2. Right kidney cyst.
3. Left-sided pelvocaliectasis which is likely related to pregnancy.

## 2018-04-21 ENCOUNTER — Encounter: Payer: Self-pay | Admitting: Internal Medicine

## 2018-04-21 ENCOUNTER — Ambulatory Visit (INDEPENDENT_AMBULATORY_CARE_PROVIDER_SITE_OTHER): Payer: 59 | Admitting: Internal Medicine

## 2018-04-21 ENCOUNTER — Other Ambulatory Visit (INDEPENDENT_AMBULATORY_CARE_PROVIDER_SITE_OTHER): Payer: 59

## 2018-04-21 VITALS — BP 124/82 | HR 81 | Temp 98.9°F | Ht 65.0 in | Wt 336.0 lb

## 2018-04-21 DIAGNOSIS — E559 Vitamin D deficiency, unspecified: Secondary | ICD-10-CM

## 2018-04-21 DIAGNOSIS — Z23 Encounter for immunization: Secondary | ICD-10-CM | POA: Diagnosis not present

## 2018-04-21 DIAGNOSIS — Z0000 Encounter for general adult medical examination without abnormal findings: Secondary | ICD-10-CM

## 2018-04-21 DIAGNOSIS — I10 Essential (primary) hypertension: Secondary | ICD-10-CM | POA: Diagnosis not present

## 2018-04-21 DIAGNOSIS — E538 Deficiency of other specified B group vitamins: Secondary | ICD-10-CM | POA: Diagnosis not present

## 2018-04-21 LAB — CBC WITH DIFFERENTIAL/PLATELET
Basophils Absolute: 0.1 10*3/uL (ref 0.0–0.1)
Basophils Relative: 1 % (ref 0.0–3.0)
Eosinophils Absolute: 0.2 10*3/uL (ref 0.0–0.7)
Eosinophils Relative: 2.5 % (ref 0.0–5.0)
HCT: 42.4 % (ref 36.0–46.0)
Hemoglobin: 14.3 g/dL (ref 12.0–15.0)
Lymphocytes Relative: 29.2 % (ref 12.0–46.0)
Lymphs Abs: 2.4 10*3/uL (ref 0.7–4.0)
MCHC: 33.7 g/dL (ref 30.0–36.0)
MCV: 93.8 fl (ref 78.0–100.0)
Monocytes Absolute: 0.6 10*3/uL (ref 0.1–1.0)
Monocytes Relative: 6.8 % (ref 3.0–12.0)
Neutro Abs: 4.9 10*3/uL (ref 1.4–7.7)
Neutrophils Relative %: 60.5 % (ref 43.0–77.0)
Platelets: 359 10*3/uL (ref 150.0–400.0)
RBC: 4.52 Mil/uL (ref 3.87–5.11)
RDW: 13.1 % (ref 11.5–15.5)
WBC: 8.1 10*3/uL (ref 4.0–10.5)

## 2018-04-21 LAB — URINALYSIS
Bilirubin Urine: NEGATIVE
Hgb urine dipstick: NEGATIVE
Ketones, ur: NEGATIVE
Leukocytes, UA: NEGATIVE
Nitrite: NEGATIVE
Specific Gravity, Urine: 1.025 (ref 1.000–1.030)
Total Protein, Urine: NEGATIVE
Urine Glucose: NEGATIVE
Urobilinogen, UA: 0.2 (ref 0.0–1.0)
pH: 5.5 (ref 5.0–8.0)

## 2018-04-21 LAB — BASIC METABOLIC PANEL
BUN: 12 mg/dL (ref 6–23)
CO2: 30 mEq/L (ref 19–32)
Calcium: 9.4 mg/dL (ref 8.4–10.5)
Chloride: 101 mEq/L (ref 96–112)
Creatinine, Ser: 0.57 mg/dL (ref 0.40–1.20)
GFR: 141.53 mL/min (ref >60.00)
Glucose, Bld: 104 mg/dL — ABNORMAL HIGH (ref 70–99)
Potassium: 3.8 mEq/L (ref 3.5–5.1)
Sodium: 138 mEq/L (ref 135–145)

## 2018-04-21 LAB — LIPID PANEL
Cholesterol: 153 mg/dL (ref 0–200)
HDL: 39.4 mg/dL (ref >39.00)
LDL Cholesterol: 96 mg/dL (ref 0–99)
NonHDL: 113.84
Total CHOL/HDL Ratio: 4
Triglycerides: 88 mg/dL (ref 0.0–149.0)
VLDL: 17.6 mg/dL (ref 0.0–40.0)

## 2018-04-21 LAB — TSH: TSH: 0.61 u[IU]/mL (ref 0.35–4.50)

## 2018-04-21 LAB — VITAMIN D 25 HYDROXY (VIT D DEFICIENCY, FRACTURES): VITD: 8.26 ng/mL — ABNORMAL LOW (ref 30.00–100.00)

## 2018-04-21 LAB — HEPATIC FUNCTION PANEL
ALT: 13 U/L (ref 0–35)
AST: 15 U/L (ref 0–37)
Albumin: 4.2 g/dL (ref 3.5–5.2)
Alkaline Phosphatase: 48 U/L (ref 39–117)
Bilirubin, Direct: 0.2 mg/dL (ref 0.0–0.3)
Total Bilirubin: 1.4 mg/dL — ABNORMAL HIGH (ref 0.2–1.2)
Total Protein: 7.3 g/dL (ref 6.0–8.3)

## 2018-04-21 LAB — VITAMIN B12: Vitamin B-12: 153 pg/mL — ABNORMAL LOW (ref 211–911)

## 2018-04-21 MED ORDER — TRIAMTERENE-HCTZ 37.5-25 MG PO TABS: 1.0000 | ORAL_TABLET | Freq: Every day | ORAL | 3 refills | Status: DC

## 2018-04-21 MED ORDER — PHENTERMINE HCL 37.5 MG PO TABS: 37.5000 mg | ORAL_TABLET | Freq: Every day | ORAL | 0 refills | Status: DC

## 2018-04-21 NOTE — Progress Notes (Signed)
Subjective:  Patient ID: Bridget [name], female    DOB: 04-Apr-1975  Age: 43 y.o. MRN: [id number]  CC: No chief complaint on file.   HPI Bridget [name] presents for a well exam Lost 10 lbs on [place]  Outpatient Medications Prior to Visit  Medication Sig Dispense Refill  . Cholecalciferol (VITAMIN D3) 2000 units capsule Take 1 capsule (2,000 Units total) by mouth daily. 100 capsule 3  . Cholecalciferol (VITAMIN D3) 50000 units CAPS Take 1 capsule by mouth once a week. 8 capsule 0  . escitalopram (LEXAPRO) 5 MG tablet Take 1 tablet (5 mg total) by mouth daily. 30 tablet 5  . loratadine (CLARITIN) 10 MG tablet Take 1 tablet (10 mg total) by mouth daily. 100 tablet 3  . ondansetron (ZOFRAN) 4 MG tablet Take 1 tablet (4 mg total) by mouth every 8 (eight) hours as needed for nausea or vomiting. 20 tablet 1  . Prenatal Vit-Fe Fumarate-FA (PRENATAL VITAMIN PO) Take 1 tablet by mouth daily.     . rizatriptan (MAXALT) 10 MG tablet Take 1 tablet (10 mg total) by mouth once as needed for up to 1 dose for migraine. May repeat in 2 hours if needed 12 tablet 5  . triamterene-hydrochlorothiazide (MAXZIDE-25) 37.5-25 MG tablet Take 1 tablet by mouth daily. Overdue for annual appt must see MD for future refills 30 tablet 11  . vitamin B-12 (CYANOCOBALAMIN) 1000 MCG tablet Take 1 tablet (1,000 mcg total) by mouth daily. 100 tablet 3   No facility-administered medications prior to visit.     ROS: Review of Systems  Constitutional: Negative for activity change, appetite change, chills, fatigue and unexpected weight change.  HENT: Negative for congestion, mouth sores and sinus pressure.   Eyes: Negative for visual disturbance.  Respiratory: Negative for cough and chest tightness.   Gastrointestinal: Negative for abdominal pain and nausea.  Genitourinary: Negative for difficulty urinating, frequency and vaginal pain.  Musculoskeletal: Negative for back pain and gait problem.  Skin: Negative for  pallor and rash.  Neurological: Negative for dizziness, tremors, weakness, numbness and headaches.  Psychiatric/Behavioral: Negative for confusion, sleep disturbance and suicidal ideas.    Objective:  BP 124/82 (BP Location: Left Arm, Patient Position: Sitting, Cuff Size: Large)   Pulse 81   Temp 98.9 F (37.2 C) (Oral)   Ht 5\' 5"  (1.651 m)   Wt (!) 336 lb (152.4 kg)   SpO2 95%   BMI 55.91 kg/m   BP Readings from Last 3 Encounters:  04/21/18 124/82  10/29/17 130/72  09/25/16 128/76    Wt Readings from Last 3 Encounters:  04/21/18 (!) 336 lb (152.4 kg)  10/29/17 (!) 328 lb (148.8 kg)  09/25/16 (!) 324 lb (147 kg)    Physical Exam Constitutional:      General: She is not in acute distress.    Appearance: She is well-developed.  HENT:     Head: Normocephalic.     Right Ear: External ear normal.     Left Ear: External ear normal.     Nose: Nose normal.  Eyes:     General:        Right eye: No discharge.        Left eye: No discharge.     Conjunctiva/sclera: Conjunctivae normal.     Pupils: Pupils are equal, round, and reactive to light.  Neck:     Musculoskeletal: Normal range of motion and neck supple.     Thyroid: No thyromegaly.  Vascular: No JVD.     Trachea: No tracheal deviation.  Cardiovascular:     Rate and Rhythm: Normal rate and regular rhythm.     Heart sounds: Normal heart sounds.  Pulmonary:     Effort: No respiratory distress.     Breath sounds: No stridor. No wheezing.  Abdominal:     General: Bowel sounds are normal. There is no distension.     Palpations: Abdomen is soft. There is no mass.     Tenderness: There is no abdominal tenderness. There is no guarding or rebound.  Musculoskeletal:        General: No tenderness.  Lymphadenopathy:     Cervical: No cervical adenopathy.  Skin:    Findings: No erythema or rash.  Neurological:     Cranial Nerves: No cranial nerve deficit.     Motor: No abnormal muscle tone.     Coordination:  Coordination normal.     Deep Tendon Reflexes: Reflexes normal.  Psychiatric:        Behavior: Behavior normal.        Thought Content: Thought content normal.        Judgment: Judgment normal.     Lab Results  Component Value Date   WBC 7.1 10/29/2017   HGB 14.1 10/29/2017   HCT 42.0 10/29/2017   PLT 371.0 10/29/2017   GLUCOSE 110 (H) 10/29/2017   CHOL 152 10/29/2017   TRIG 56.0 10/29/2017   HDL 38.50 (L) 10/29/2017   LDLCALC 103 (H) 10/29/2017   ALT 10 10/29/2017   AST 12 10/29/2017   NA 138 10/29/2017   K 3.5 10/29/2017   CL 101 10/29/2017   CREATININE 0.82 10/29/2017   BUN 13 10/29/2017   CO2 31 10/29/2017   TSH 0.59 10/29/2017   HGBA1C 5.3 10/29/2017    [place] Mfm Ob Limited  Result Date: 03/20/2016 OBSTETRICAL ULTRASOUND: This exam was performed within a [facility]. The OB US report was generated in the [facility] system, and faxed to the ordering physician.  This report is available in the [facility]. See the [facility] Obstetric US report via the Image Link.   Assessment & Plan:   Diagnoses and all orders for this visit:  Need for influenza vaccination -     Flu Vaccine QUAD 6+ mos PF IM (Fluarix Quad PF)     No orders of the defined types were placed in this encounter.    Follow-up: No follow-ups on file.  Walker Kehr, MD

## 2018-04-21 NOTE — Assessment & Plan Note (Signed)
Stopped B12 - taking a MVI

## 2018-04-21 NOTE — Assessment & Plan Note (Signed)
Maxzide

## 2018-04-21 NOTE — Assessment & Plan Note (Signed)
We discussed age appropriate health related issues, including available/recomended screening tests and vaccinations. We discussed a need for adhering to healthy diet and exercise. Labs were ordered to be later reviewed . All questions were answered.   

## 2018-04-22 ENCOUNTER — Other Ambulatory Visit: Payer: Self-pay | Admitting: Internal Medicine

## 2018-04-22 MED ORDER — VITAMIN B-12 1000 MCG PO TABS: 1000.0000 ug | ORAL_TABLET | Freq: Every day | ORAL | 3 refills | Status: AC

## 2018-04-22 MED ORDER — VITAMIN D3 50 MCG (2000 UT) PO CAPS: 2000.0000 [IU] | ORAL_CAPSULE | Freq: Every day | ORAL | 3 refills | Status: DC

## 2018-04-22 MED ORDER — VITAMIN D3 1.25 MG (50000 UT) PO CAPS: 1.0000 | ORAL_CAPSULE | ORAL | 0 refills | Status: DC

## 2018-06-30 ENCOUNTER — Telehealth: Payer: Self-pay | Admitting: Internal Medicine

## 2018-06-30 ENCOUNTER — Other Ambulatory Visit: Payer: Self-pay | Admitting: Internal Medicine

## 2018-06-30 ENCOUNTER — Other Ambulatory Visit (INDEPENDENT_AMBULATORY_CARE_PROVIDER_SITE_OTHER): Payer: 59

## 2018-06-30 ENCOUNTER — Other Ambulatory Visit: Payer: Self-pay

## 2018-06-30 ENCOUNTER — Ambulatory Visit (INDEPENDENT_AMBULATORY_CARE_PROVIDER_SITE_OTHER): Payer: 59 | Admitting: Internal Medicine

## 2018-06-30 ENCOUNTER — Ambulatory Visit (INDEPENDENT_AMBULATORY_CARE_PROVIDER_SITE_OTHER)
Admission: RE | Admit: 2018-06-30 | Discharge: 2018-06-30 | Disposition: A | Discharge status: Home / Self Care | Payer: 59 | Source: Ambulatory Visit | Attending: Internal Medicine | Admitting: Internal Medicine

## 2018-06-30 ENCOUNTER — Encounter: Payer: Self-pay | Admitting: Internal Medicine

## 2018-06-30 DIAGNOSIS — R0609 Other forms of dyspnea: Secondary | ICD-10-CM

## 2018-06-30 DIAGNOSIS — R0789 Other chest pain: Secondary | ICD-10-CM

## 2018-06-30 DIAGNOSIS — E559 Vitamin D deficiency, unspecified: Secondary | ICD-10-CM

## 2018-06-30 DIAGNOSIS — E538 Deficiency of other specified B group vitamins: Secondary | ICD-10-CM | POA: Diagnosis not present

## 2018-06-30 DIAGNOSIS — D509 Iron deficiency anemia, unspecified: Secondary | ICD-10-CM | POA: Diagnosis not present

## 2018-06-30 LAB — CBC WITH DIFFERENTIAL/PLATELET
Basophils Absolute: 0.1 10*3/uL (ref 0.0–0.1)
Basophils Relative: 0.9 % (ref 0.0–3.0)
Eosinophils Absolute: 0.1 10*3/uL (ref 0.0–0.7)
Eosinophils Relative: 1.3 % (ref 0.0–5.0)
HCT: 41.4 % (ref 36.0–46.0)
Hemoglobin: 14.1 g/dL (ref 12.0–15.0)
Lymphocytes Relative: 29 % (ref 12.0–46.0)
Lymphs Abs: 2.3 10*3/uL (ref 0.7–4.0)
MCHC: 34 g/dL (ref 30.0–36.0)
MCV: 93.9 fl (ref 78.0–100.0)
Monocytes Absolute: 0.5 10*3/uL (ref 0.1–1.0)
Monocytes Relative: 6.4 % (ref 3.0–12.0)
Neutro Abs: 5 10*3/uL (ref 1.4–7.7)
Neutrophils Relative %: 62.4 % (ref 43.0–77.0)
Platelets: 353 10*3/uL (ref 150.0–400.0)
RBC: 4.41 Mil/uL (ref 3.87–5.11)
RDW: 13.4 % (ref 11.5–15.5)
WBC: 8.1 10*3/uL (ref 4.0–10.5)

## 2018-06-30 LAB — VITAMIN B12: Vitamin B-12: 443 pg/mL (ref 211–911)

## 2018-06-30 LAB — BASIC METABOLIC PANEL
BUN: 10 mg/dL (ref 6–23)
CO2: 26 mEq/L (ref 19–32)
Calcium: 9.9 mg/dL (ref 8.4–10.5)
Chloride: 103 mEq/L (ref 96–112)
Creatinine, Ser: 0.68 mg/dL (ref 0.40–1.20)
GFR: 115.34 mL/min (ref >60.00)
Glucose, Bld: 102 mg/dL — ABNORMAL HIGH (ref 70–99)
Potassium: 3.8 mEq/L (ref 3.5–5.1)
Sodium: 139 mEq/L (ref 135–145)

## 2018-06-30 LAB — VITAMIN D 25 HYDROXY (VIT D DEFICIENCY, FRACTURES): VITD: 8.04 ng/mL — ABNORMAL LOW (ref 30.00–100.00)

## 2018-06-30 LAB — D-DIMER, QUANTITATIVE: D-Dimer, Quant: 2.26 mcg/mL FEU — ABNORMAL HIGH (ref <0.50)

## 2018-06-30 MED ORDER — RIVAROXABAN (XARELTO) VTE STARTER PACK (15 & 20 MG): ORAL_TABLET | ORAL | 0 refills | Status: DC

## 2018-06-30 MED ORDER — AZITHROMYCIN 250 MG PO TABS: ORAL_TABLET | ORAL | 0 refills | Status: DC

## 2018-06-30 NOTE — Assessment & Plan Note (Signed)
Obtain CBC 

## 2018-06-30 NOTE — Telephone Encounter (Signed)
Received call from after hours service that 2 prescriptions were not received by pharmacy from visit z-pack and xarelto starter which were resent to patient's pharmacy.

## 2018-06-30 NOTE — Addendum Note (Signed)
Addended by: Cassandria Anger on: 06/30/2018 04:53 PM   Modules accepted: Level of Service

## 2018-06-30 NOTE — Assessment & Plan Note (Addendum)
Unclear etiology.  Differential diagnosis is broad.  The patient will go to ER if symptoms worsen.  Continue with Zyrtec daily.  Okay to take over-the-counter cold meds.  Will obtain a chest x-ray and stat d-dimer, CBC, BMET. 4:30 pm: Follow-up call -the patient is actually feeling better.  We discussed her elevated d-dimer and her abnormal chest x-ray suspicious for pneumonia.  Options discussed.  She has kids at home.  We discussed the option to go to emergency room now.  The pt says she is feeling better and in the view of COVID-19 situation, will start not Lal[place] on Xarelto starter pack and a Z-Pak.  Will do a CAT scan tomorrow morning to rule out PE. Go to ER tonight if feeling worse. There is no obvious PE risk factors.  There is no clinical signs of pneumonia except for dyspnea, that is better.

## 2018-06-30 NOTE — Telephone Encounter (Signed)
Pt saw Dr. Alain Marion today for DOE; D-Dimer elevated. Pt states she was to start on Xarelto and Z-pack this evening and have CAT scan in AM to rule out PE. Pt states she is at pharmacy and med not available. Pt states she was instructed by Dr. Lanice Schwab to start meds this evening. Notes verify instructions.  Call placed to Dr. Sharlet Salina, on call physician. States she will call in to pts preferred pharmacy, [facility] on [address]. [place] called back pt, made aware.

## 2018-06-30 NOTE — Assessment & Plan Note (Signed)
On sublingual vitamin B12.  Repeat B12

## 2018-06-30 NOTE — Assessment & Plan Note (Signed)
On vitamin D weekly.  Repeat vitamin D level

## 2018-06-30 NOTE — Progress Notes (Addendum)
Virtual Visit via Telephone Note  I connected with Bridget [name] on 06/30/18 at  9:10 AM EDT by telephone and verified that I am speaking with the correct person using two identifiers.   I discussed the limitations, risks, security and privacy concerns of performing an evaluation and management service by telephone and the availability of in person appointments. I also discussed with the patient that there may be a patient responsible charge related to this service. The patient expressed understanding and agreed to proceed.   History of Present Illness:   AM: Bridget [name] is complaining of shortness of breath since yesterday.  She started to notice shortness of breath with exertion.  There is no cough, fever, body aches or wheezing.  She has been working from home.  She went to pick up for the right and also on Friday.  She has been giving her physical human contacts to very minimum.  There was some pain in the left side of her back that later became low back pain then resolved.  Her temperature is normal today, there is no cough.  No family history of blood clots.  No chest pain  4:30 pm: Follow-up call -the patient is actually feeling better.  We discussed her elevated d-dimer and her abnormal chest x-ray suspicious for pneumonia.  Options discussed.  She has kids at home.  We discussed the option to go to emergency room now.  The pt says she is feeling better and in the view of COVID-19 situation, will start not Bridget [name] on Xarelto starter pack and a Z-Pak.  Will do a CAT scan tomorrow morning to rule out PE. Go to ER tonight if feeling worse. There is no obvious PE risk factors.  There is no clinical signs of pneumonia except for dyspnea, that is better. Observations/Objective:  She appears normal.  She does not appear dyspneic or pale Assessment and Plan: See plan Go to ER if worse COVID-19 prophylaxis discussed  Follow Up Instructions:    I discussed the assessment and treatment plan  with the patient. The patient was provided an opportunity to ask questions and all were answered. The patient agreed with the plan and demonstrated an understanding of the instructions.   The patient was advised to call back or seek an in-person evaluation if the symptoms worsen or if the condition fails to improve as anticipated.  I provided 45 minutes of non-face-to-face time during this encounter.   Walker Kehr, MD

## 2018-07-01 ENCOUNTER — Telehealth: Payer: Self-pay | Admitting: *Deleted

## 2018-07-01 ENCOUNTER — Ambulatory Visit (INDEPENDENT_AMBULATORY_CARE_PROVIDER_SITE_OTHER)
Admission: RE | Admit: 2018-07-01 | Discharge: 2018-07-01 | Disposition: A | Discharge status: Home / Self Care | Payer: 59 | Source: Ambulatory Visit | Attending: Internal Medicine | Admitting: Internal Medicine

## 2018-07-01 ENCOUNTER — Other Ambulatory Visit: Payer: Self-pay

## 2018-07-01 DIAGNOSIS — R0609 Other forms of dyspnea: Secondary | ICD-10-CM | POA: Diagnosis not present

## 2018-07-01 MED ORDER — IOHEXOL 350 MG/ML SOLN
75.0000 mL | Freq: Once | INTRAVENOUS | Status: AC | PRN
  Administered 2018-07-01: 75 mL via INTRAVENOUS

## 2018-07-01 NOTE — Telephone Encounter (Signed)
Covid-19 travel screening questions  Have you traveled in the last 14 days? If yes where? No Do you now or have you had a fever in the last 14 days? No Do you have any respiratory symptoms of shortness of breath or cough now or in the last 14 days? No Do you have any family members or close contacts with diagnosed or suspected Covid-19? No      

## 2018-07-06 ENCOUNTER — Ambulatory Visit (INDEPENDENT_AMBULATORY_CARE_PROVIDER_SITE_OTHER): Payer: 59 | Admitting: Internal Medicine

## 2018-07-06 ENCOUNTER — Ambulatory Visit: Payer: Self-pay | Admitting: Internal Medicine

## 2018-07-06 ENCOUNTER — Telehealth: Payer: Self-pay | Admitting: Internal Medicine

## 2018-07-06 ENCOUNTER — Telehealth: Payer: Self-pay | Admitting: *Deleted

## 2018-07-06 ENCOUNTER — Encounter: Payer: Self-pay | Admitting: Internal Medicine

## 2018-07-06 ENCOUNTER — Telehealth: Payer: Self-pay

## 2018-07-06 DIAGNOSIS — E538 Deficiency of other specified B group vitamins: Secondary | ICD-10-CM | POA: Diagnosis not present

## 2018-07-06 DIAGNOSIS — I10 Essential (primary) hypertension: Secondary | ICD-10-CM

## 2018-07-06 DIAGNOSIS — M7989 Other specified soft tissue disorders: Secondary | ICD-10-CM | POA: Diagnosis not present

## 2018-07-06 DIAGNOSIS — R0609 Other forms of dyspnea: Secondary | ICD-10-CM | POA: Diagnosis not present

## 2018-07-06 NOTE — Assessment & Plan Note (Signed)
Recurrent.  Etiology remains unclear.  Obtain a cardiac echocardiogram.  Continue with Maxide

## 2018-07-06 NOTE — Assessment & Plan Note (Signed)
L-unclear etiology Obtain a left upper extremity venous Doppler ultrasound Bridget [name] will continue with Xarelto for now

## 2018-07-06 NOTE — Telephone Encounter (Signed)
Virtual visit has been made.  °

## 2018-07-06 NOTE — Telephone Encounter (Signed)
Copied from [place] [id number]. Topic: General - Inquiry >> Jul 06, 2018  1:13 PM Celene Kras A wrote: Reason for CRM: Pt called stating she is still having pain in her lungs and her back and is requesting to know if she should get another x-ray done or take tylenol. Pt states she had a Zpack before and it had six pills but after she had finished with it the pain has continually gotten worse. She is requesting advice on what is best for her to do at this time. Please advise.

## 2018-07-06 NOTE — Telephone Encounter (Signed)
Noted - agree. Plan - as before Thx

## 2018-07-06 NOTE — Telephone Encounter (Signed)
Copied from [place] [id number]. Topic: General - Other >> Jul 06, 2018  8:05 AM Bridget [name] E wrote: Reason for CRM:Pt called and stated her symptoms have gotten worse since seeing Dr. Alain Marion. Pt was advised by a nurse to go to the ER over the weekend but she has small children and wasn't able to do so. Pt wants to report what she is experiencing. Pt was feeling ok for two days and then on Saturday morning the Pt had shortness of breath and left shoulder pain with swelling of left hand and arm and chest pain. Pt stated this morning her left hand still has swelling, she is still experiencing shortness of breath with chest pain and shoulder pain. Pt started back taking the Xarelto even though she was told to stop taking it. She feels something may be wrong with her heart and blood clotting. Pt is very concerned. She works from home and is with small children. Pt stated the Xarelto made her feel better. Pt stopped taking her  triamterene-hydrochlorothiazide (MAXZIDE-25) 37.5-25 MG tablet while she was taking the Z-pak and also wonders if she had a fluid build up. / Pt wants to speak with Dr. Alain Marion personally and Pt was in tears while sharing her concerns .Derrek Monaco advise asap / I also had the triage nurse reach out to the Pt  Patient contacted by carson/sched, she would like to have virtual visit with dr Alain Marion, patient has been scheduled for virtual visit at 10am today with dr Alain Marion

## 2018-07-06 NOTE — Telephone Encounter (Signed)
Left mess for patient to call back to schedule a 1 week f/u with PCP per PCP. This needs to be a 1 week DOXY virtual visit. CRM created.

## 2018-07-06 NOTE — Assessment & Plan Note (Signed)
On B12 

## 2018-07-06 NOTE — Telephone Encounter (Addendum)
Patient called to schedule her one week follow up which has been done. She was also following up on this message as well as the EKG that she was advised to have done.

## 2018-07-06 NOTE — Telephone Encounter (Signed)
See triage note.

## 2018-07-06 NOTE — Telephone Encounter (Signed)
Pt. Reports over the weekend she had more shortness of breath and also chest pain. Pain radiated to left arm, shoulder and neck. No chest pain this morning, but still having left arm pain and her left hand is swollen. Called the office over the weekend and was instructed to go to ED. States she does not want to go unless Dr. Alain Marion tells her to. Spoke with Jonelle Sidle in the practice and she will speak with him and call pt. Back. Best contact number [phone]. Answer Assessment - Initial Assessment Questions 1. RESPIRATORY STATUS: "Describe your breathing?" (e.g., wheezing, shortness of breath, unable to speak, severe coughing)      Shortness of breath again over the weekend 2. ONSET: "When did this breathing problem begin?"      This weekend 3. PATTERN "Does the difficult breathing come and go, or has it been constant since it started?"      Comes and goes 4. SEVERITY: "How bad is your breathing?" (e.g., mild, moderate, severe)    - MILD: No SOB at rest, mild SOB with walking, speaks normally in sentences, can lay down, no retractions, pulse < 100.    - MODERATE: SOB at rest, SOB with minimal exertion and prefers to sit, cannot lie down flat, speaks in phrases, mild retractions, audible wheezing, pulse 100-120.    - SEVERE: Very SOB at rest, speaks in single words, struggling to breathe, sitting hunched forward, retractions, pulse > 120      Mild 5. RECURRENT SYMPTOM: "Have you had difficulty breathing before?" If so, ask: "When was the last time?" and "What happened that time?"      Last week 6. CARDIAC HISTORY: "Do you have any history of heart disease?" (e.g., heart attack, angina, bypass surgery, angioplasty)      No 7. LUNG HISTORY: "Do you have any history of lung disease?"  (e.g., pulmonary embolus, asthma, emphysema)     No 8. CAUSE: "What do you think is causing the breathing problem?"      Unsure 9. OTHER SYMPTOMS: "Do you have any other symptoms? (e.g., dizziness, runny nose, cough,  chest pain, fever)     Chest pain over the weekend 10. PREGNANCY: "Is there any chance you are pregnant?" "When was your last menstrual period?"       No 11. TRAVEL: "Have you traveled out of the country in the last month?" (e.g., travel history, exposures)       No  Protocols used: BREATHING DIFFICULTY-A-AH

## 2018-07-06 NOTE — Assessment & Plan Note (Signed)
Maxide was restarted

## 2018-07-06 NOTE — Telephone Encounter (Signed)
Pt is scheduled with PCP 07/13/18 @ 7:50am.

## 2018-07-06 NOTE — Telephone Encounter (Signed)
Patient has already scheduled a one week follow up with dr Alain Marion per dr plotnikov's request---patient also states her lungs are still hurting---per dr Alain Marion, the abx will continue to work for 10 days after the last pill was administered, so it should continue to work and she should start feeling better in a few more days--patient can take tylenol and cough syrup for continued lung discomfort for right now---if that doesn't work, per dr [employer], he can call in something else for pain--patient advised, she will try taking tylenol/cough syrup to see if pain is better managed, if not, she will call back in for rx request---patient is already scheduled to have ultra sound tomorrow 07/07/18 at 1;00--we are currently working on getting stat echo scheduled same day with cardiology---lori/referrals has left several messages requesting the appt to be scheduled same day as ultrasound, I am waiting to hear back from that department, patient advised I will call her back as soon as we know when appt is scheduled.

## 2018-07-06 NOTE — Progress Notes (Signed)
Virtual Visit via Telephone Note  I connected with Bridget [name] on 07/06/18 at 10:00 AM EDT by telephone and verified that I am speaking with the correct person using two identifiers.   I discussed the limitations, risks, security and privacy concerns of performing an evaluation and management service by telephone and the availability of in person appointments. I also discussed with the patient that there may be a patient responsible charge related to this service. The patient expressed understanding and agreed to proceed.   History of Present Illness:  Re: last week - pt was doing well x 2 days Finished a Z pac C/o L arm pain, heavy; L hand was swollen on Thur-Fri. The pt re-started Xarelto thinking it was helping her SOB/DOE... No CP, some pressure... Back on Triamt/HCT   Observations/Objective:  Bridget [name] is in no acute distress.  There is no dyspnea.  There is no visible hand/arm swelling Assessment and Plan:  See plan Follow Up Instructions:    I discussed the assessment and treatment plan with the patient. The patient was provided an opportunity to ask questions and all were answered. The patient agreed with the plan and demonstrated an understanding of the instructions.   The patient was advised to call back or seek an in-person evaluation if the symptoms worsen or if the condition fails to improve as anticipated.  I provided 20 minutes of non-face-to-face time during this encounter.   Walker Kehr, MD

## 2018-07-07 ENCOUNTER — Other Ambulatory Visit: Payer: Self-pay

## 2018-07-07 ENCOUNTER — Ambulatory Visit (HOSPITAL_BASED_OUTPATIENT_CLINIC_OR_DEPARTMENT_OTHER): Payer: 59

## 2018-07-07 ENCOUNTER — Ambulatory Visit (HOSPITAL_COMMUNITY)
Admission: RE | Admit: 2018-07-07 | Discharge: 2018-07-07 | Disposition: A | Discharge status: Home / Self Care | Payer: 59 | Source: Ambulatory Visit | Attending: Internal Medicine | Admitting: Internal Medicine

## 2018-07-07 DIAGNOSIS — R0609 Other forms of dyspnea: Secondary | ICD-10-CM | POA: Insufficient documentation

## 2018-07-07 DIAGNOSIS — M7989 Other specified soft tissue disorders: Secondary | ICD-10-CM

## 2018-07-08 NOTE — Telephone Encounter (Signed)
Please advise 

## 2018-07-08 NOTE — Telephone Encounter (Signed)
Pt.notified

## 2018-07-08 NOTE — Telephone Encounter (Signed)
Take Tylenol PRN pain. Move more.  Exercise.  Get outside to walk, but keep social distancing.  No need to do more tests.  The problem could be related to recent respiratory illness, stress, isolation.  Thx

## 2018-07-13 ENCOUNTER — Other Ambulatory Visit: Payer: Self-pay

## 2018-07-13 ENCOUNTER — Other Ambulatory Visit (INDEPENDENT_AMBULATORY_CARE_PROVIDER_SITE_OTHER): Payer: 59

## 2018-07-13 ENCOUNTER — Ambulatory Visit (INDEPENDENT_AMBULATORY_CARE_PROVIDER_SITE_OTHER): Payer: 59 | Admitting: Internal Medicine

## 2018-07-13 ENCOUNTER — Telehealth: Payer: Self-pay | Admitting: Internal Medicine

## 2018-07-13 ENCOUNTER — Encounter: Payer: Self-pay | Admitting: Internal Medicine

## 2018-07-13 DIAGNOSIS — M545 Low back pain, unspecified: Secondary | ICD-10-CM

## 2018-07-13 DIAGNOSIS — Z20822 Contact with and (suspected) exposure to covid-19: Secondary | ICD-10-CM

## 2018-07-13 DIAGNOSIS — F419 Anxiety disorder, unspecified: Secondary | ICD-10-CM | POA: Diagnosis not present

## 2018-07-13 DIAGNOSIS — R0609 Other forms of dyspnea: Secondary | ICD-10-CM

## 2018-07-13 DIAGNOSIS — Z20828 Contact with and (suspected) exposure to other viral communicable diseases: Secondary | ICD-10-CM

## 2018-07-13 DIAGNOSIS — F32A Depression, unspecified: Secondary | ICD-10-CM | POA: Insufficient documentation

## 2018-07-13 LAB — URINALYSIS, ROUTINE W REFLEX MICROSCOPIC
Ketones, ur: NEGATIVE
Leukocytes,Ua: NEGATIVE
Nitrite: NEGATIVE
Specific Gravity, Urine: 1.01 (ref 1.000–1.030)
Total Protein, Urine: NEGATIVE
Urine Glucose: NEGATIVE
Urobilinogen, UA: 0.2 (ref 0.0–1.0)
pH: 6.5 (ref 5.0–8.0)

## 2018-07-13 LAB — BASIC METABOLIC PANEL
BUN: 12 mg/dL (ref 6–23)
CO2: 26 mEq/L (ref 19–32)
Calcium: 9.5 mg/dL (ref 8.4–10.5)
Chloride: 102 mEq/L (ref 96–112)
Creatinine, Ser: 0.75 mg/dL (ref 0.40–1.20)
GFR: 103 mL/min (ref >60.00)
Glucose, Bld: 102 mg/dL — ABNORMAL HIGH (ref 70–99)
Potassium: 3.3 mEq/L — ABNORMAL LOW (ref 3.5–5.1)
Sodium: 140 mEq/L (ref 135–145)

## 2018-07-13 LAB — HEPATIC FUNCTION PANEL
ALT: 8 U/L (ref 0–35)
AST: 12 U/L (ref 0–37)
Albumin: 4.3 g/dL (ref 3.5–5.2)
Alkaline Phosphatase: 41 U/L (ref 39–117)
Bilirubin, Direct: 0.3 mg/dL (ref 0.0–0.3)
Total Bilirubin: 1.3 mg/dL — ABNORMAL HIGH (ref 0.2–1.2)
Total Protein: 7.3 g/dL (ref 6.0–8.3)

## 2018-07-13 LAB — TIQ- AMBIGUOUS ORDER

## 2018-07-13 MED ORDER — ACETAMINOPHEN-CODEINE 300-30 MG PO TABS: 0.5000 | ORAL_TABLET | Freq: Four times a day (QID) | ORAL | 0 refills | Status: DC | PRN

## 2018-07-13 MED ORDER — ESCITALOPRAM OXALATE 5 MG PO TABS: 5.0000 mg | ORAL_TABLET | Freq: Every day | ORAL | 5 refills | Status: DC

## 2018-07-13 NOTE — Progress Notes (Signed)
Virtual Visit via Telephone Note  I connected with Bridget [name] on 07/13/18 at  7:50 AM EDT by telephone and verified that I am speaking with the correct person using two identifiers.   I discussed the limitations, risks, security and privacy concerns of performing an evaluation and management service by telephone and the availability of in person appointments. I also discussed with the patient that there may be a patient responsible charge related to this service. The patient expressed understanding and agreed to proceed.   History of Present Illness:   The patient continues to have shortness of breath, chest pain, low back pain.  Her low back pain has been severe.  Tylenol has been helping.  She states that Xarelto is the only thing that makes her breathe better.  She never stopped it.  She thinks she has a small clot in the lungs in spite of and negative CT scan of the chest.  She has lost weight.  She is on her monthly cycle now and it has been prolonged.  She has been unable to stress.  She reads a lot of YEMVV-61 complications and would like to be tested. Observations/Objective:  The patient has no acute distress, except with emotional distress.  She is tearful.  She is not visibly dyspneic. Assessment and Plan:  See plan.  We will continue to rule out underlying physical illness.  Will repeat a d-dimer.  Will check COVID-19 IgG.  I will prescribe Tylenol 3 for pain.  Will obtain a pulmonary consultation to look at the whole situation.  We will restart Lexapro.  The patient will continue Xarelto for the time being. Follow Up Instructions:    I discussed the assessment and treatment plan with the patient. The patient was provided an opportunity to ask questions and all were answered. The patient agreed with the plan and demonstrated an understanding of the instructions.   The patient was advised to call back or seek an in-person evaluation if the symptoms worsen or if the condition  fails to improve as anticipated.  I provided 25 minutes of non-face-to-face time during this encounter.   Walker Kehr, MD

## 2018-07-13 NOTE — Addendum Note (Signed)
Addended by: Karren Cobble on: 07/13/2018 03:18 PM   Modules accepted: Orders

## 2018-07-13 NOTE — Telephone Encounter (Signed)
Called team health on 07/12/18 at 10:54pm.  Stating she wanted to speak with someone before her appointment this morning.  She states she has pain in her lungs and has SOB.  She tested positive for a blood clot.  The CT scan did not show the clot.  She states she is on xarelto.  States doctors want her to come off the blood thinner but it helps her breath.  States she is having kidney pain as well.

## 2018-07-13 NOTE — Telephone Encounter (Signed)
Pt had virtual already.

## 2018-07-13 NOTE — Assessment & Plan Note (Signed)
Likely musculoskeletal.  Tylenol 3 prescription emailed.  Urinalysis.

## 2018-07-13 NOTE — Assessment & Plan Note (Signed)
Situational anxiety, worse.  Re-start Lexapro

## 2018-07-13 NOTE — Assessment & Plan Note (Signed)
The patient continues to have shortness of breath, chest pain, low back pain.  Her low back pain has been severe.  Tylenol has not been helping.  She states that Xarelto is the only thing that makes her breathe better.  She never stopped it.  She thinks she has a small clot in the lungs in spite of the negative CT scan of the chest.  She has lost weight.  She is on her monthly cycle now and it has been prolonged.  She has been unable to stress.  She reads a lot of TMLYY-50 complications and would like to be tested. The patient has no acute distress, except with emotional distress.  She is tearful.  She is not visibly dyspneic. We will continue to rule out underlying physical illness.  Will repeat a d-dimer.  Will check COVID-19 IgG.  I will prescribe Tylenol 3 for pain.  Will obtain a pulmonary consultation to look at the whole situation.  We will restart Lexapro.  The patient will continue Xarelto for the time being.

## 2018-07-14 LAB — D-DIMER, QUANTITATIVE: D-Dimer, Quant: 0.36 mcg/mL FEU (ref <0.50)

## 2018-07-15 ENCOUNTER — Ambulatory Visit: Payer: Self-pay | Admitting: Internal Medicine

## 2018-07-15 ENCOUNTER — Telehealth: Payer: Self-pay | Admitting: Internal Medicine

## 2018-07-15 LAB — SAR COV2 SEROLOGY (COVID19)AB(IGG),IA: SARS CoV2 AB IGG: NEGATIVE

## 2018-07-15 LAB — TEST AUTHORIZATION

## 2018-07-15 NOTE — Telephone Encounter (Signed)
Pt called in for lab results however there was no interpretation from Dr. Alain Marion found so I had the agent transfer the pt to the [facility] office.

## 2018-07-15 NOTE — Telephone Encounter (Signed)
Patient requesting call back with recent lab results.

## 2018-07-16 NOTE — Telephone Encounter (Signed)
Pt notified, pt stated she is still having SOB, would like to continue Xarelto as it is helping her breath better. Please advise

## 2018-07-16 NOTE — Telephone Encounter (Signed)
Ok to cont w/Xarelto. Is her pulmonary VOV scheduled. If not - pls sch ASAP Thx

## 2018-07-16 NOTE — Telephone Encounter (Signed)
Pt is complaining that she did not get a call back yesterday Please FU

## 2018-07-16 NOTE — Telephone Encounter (Signed)
Pls inform the pt:  COVID blood test was negative. Blood clot test was normal Potassium is a little low - take OTC potassium, one tablet a day thx

## 2018-07-16 NOTE — Telephone Encounter (Signed)
Pt notified, pt given number to schedule appt

## 2018-07-21 ENCOUNTER — Other Ambulatory Visit: Payer: Self-pay

## 2018-07-21 ENCOUNTER — Ambulatory Visit (INDEPENDENT_AMBULATORY_CARE_PROVIDER_SITE_OTHER): Payer: 59 | Admitting: Internal Medicine

## 2018-07-21 ENCOUNTER — Encounter: Payer: Self-pay | Admitting: Internal Medicine

## 2018-07-21 DIAGNOSIS — I2699 Other pulmonary embolism without acute cor pulmonale: Secondary | ICD-10-CM

## 2018-07-21 NOTE — Patient Instructions (Addendum)
Weight control is simply a matter of calorie balance which needs to be tilted in your favor by eating less and exercising more.  To get the most out of exercise, you need to be continuously aware that you are short of breath, but never out of breath, for 30 minutes daily. As you improve, it will actually be easier for you to do the same amount of exercise  in  30 minutes so always push to the level where you are short of breath.  If this does not result in gradual weight reduction then I strongly recommend you see a nutritionist with a food diary x 2 weeks so that we can work out a negative calorie balance which is universally effective in steady weight loss programs.  Think of your calorie balance like you do your bank account where in this case you want the balance to go down so you must take in less calories than you burn up.  It's just that simple:  Hard to do, but easy to understand.  Good luck!   In oct 2020 you will need a venous doppler of the lower extremities and then consider a hematology evaluation to decide on whether you some form of life long anticoagulation

## 2018-07-21 NOTE — Progress Notes (Signed)
Bridget [name], female    DOB: 07/02/1975,   MRN: [id number]   Brief patient profile:  32 yobf never smoker last child born 2018 with nl IUP term with baseline wt p baby around 290lb and able to ex able to yardwork / gardening and able to walk [place] at slower than nl pace / one flight steps  and no trouble at top then [facility] Sunday 06/28/2018 sudden sob at rest assoc with constant L flank pain radiated both both of top of chest post lasting for a few days then waxed and waned since and better with codeine "just like my husband's PE" >  televist during office hours 06/30/18 > rec labs:  pos dimer same day started xarelto and breathing and pain seemed better w/in hours but CTa 07/01/2018  done p ? 2  doses showed no PE >  Took total of 3 doses and stopped it and 24 h same problems recurred  and xarelto restarted and improved again so referred to pulmonary clinic 07/21/2018 by Dr   Oletha Blend re ? Dx   Father had DVT   History of Present Illness  07/21/2018  Pulmonary/ 1st office eval/Bridget [name]  Chief Complaint  Patient presents with  . Pulmonary Consult    Referred by Dr Alain Marion. Pt states started on xartelto based on elevated d dimer but her ct chest was neg for PE. When she was told to stop xarelto she felt SOB so started back on med and feeling much better.   Dyspnea:  Much better now back to baseline including one flight of steps  Cough: none Sleep: R sleeps on r arm  SABA use: none   No obvious day to day or daytime variability or assoc excess/ purulent sputum or mucus plugs or hemoptysis or   chest tightness, subjective wheeze or overt sinus or hb symptoms.   sleeping as above without nocturnal  or early am exacerbation  of respiratory  c/o's or need for noct saba. Also denies any obvious fluctuation of symptoms with weather or environmental changes or other aggravating or alleviating factors except as outlined above   No unusual exposure hx or h/o childhood pna/ asthma or knowledge of premature  birth.  Current Allergies, Complete Past Medical History, Past Surgical History, Family History, and Social History were reviewed in [employer] record.  ROS  The following are not active complaints unless bolded Hoarseness, sore throat, dysphagia, dental problems, itching, sneezing,  nasal congestion or discharge of excess mucus or purulent secretions, ear ache,   fever, chills, sweats, unintended wt loss or wt gain, classically pleuritic or exertional cp,  orthopnea pnd or arm/hand swelling  or leg swelling, presyncope, palpitations, abdominal pain, anorexia, nausea, vomiting, diarrhea  or change in bowel habits or change in bladder habits, change in stools or change in urine, dysuria, hematuria,  rash, arthralgias, visual complaints, headache, numbness, weakness or ataxia or problems with walking or coordination,  change in mood or  memory.           Past Medical History:  Diagnosis Date  . Constipation   . Edema   . Headache(784.0)   . Morbid obesity ([place])     Outpatient Medications Prior to Visit  Medication Sig Dispense Refill  . Acetaminophen-Codeine (TYLENOL/CODEINE #3) 300-30 MG tablet Take 0.5-1 tablets by mouth every 6 (six) hours as needed for pain. 20 tablet 0  . cetirizine (ZYRTEC) 10 MG tablet Take 10 mg by mouth daily as needed for allergies.    Marland Kitchen  Cholecalciferol (VITAMIN D3) 1.25 MG (50000 UT) CAPS Take 1 capsule by mouth once a week. 8 capsule 0  . ondansetron (ZOFRAN) 4 MG tablet Take 1 tablet (4 mg total) by mouth every 8 (eight) hours as needed for nausea or vomiting. 20 tablet 1  . Rivaroxaban 15 & 20 MG TBPK Take as directed on package: Start with one 15mg  tablet by mouth twice a day with food. On Day 22, switch to one 20mg  tablet once a day with food. 51 each 0  . rizatriptan (MAXALT) 10 MG tablet Take 1 tablet (10 mg total) by mouth once as needed for up to 1 dose for migraine. May repeat in 2 hours if needed 12 tablet 5  .  triamterene-hydrochlorothiazide (MAXZIDE-25) 37.5-25 MG tablet Take 1 tablet by mouth daily. Overdue for annual appt must see MD for future refills 90 tablet 3  . vitamin B-12 (CYANOCOBALAMIN) 1000 MCG tablet Take 1 tablet (1,000 mcg total) by mouth daily. 100 tablet 3  . escitalopram (LEXAPRO) 5 MG tablet Take 1 tablet (5 mg total) by mouth daily. (Patient not taking: Reported on 07/21/2018) 30 tablet 5  . azithromycin (ZITHROMAX Z-PAK) 250 MG tablet As directed 6 tablet 0  . Cholecalciferol (VITAMIN D3) 50 MCG (2000 UT) capsule Take 1 capsule (2,000 Units total) by mouth daily. 100 capsule 3  . loratadine (CLARITIN) 10 MG tablet Take 1 tablet (10 mg total) by mouth daily. 100 tablet 3  . Prenatal Vit-Fe Fumarate-FA (PRENATAL VITAMIN PO) Take 1 tablet by mouth daily.         Objective:     BP 132/84 (BP Location: Left Wrist, Cuff Size: Normal)   Pulse 83   Temp 97.8 F (36.6 C) (Oral)   Ht 5\' 4"  (1.626 m)   Wt (!) 319 lb 12.8 oz (145.1 kg)   SpO2 98%   BMI 54.89 kg/m   SpO2: 98 %  RA   Obese pleasant bf nad  HEENT: nl dentition, turbinates bilaterally, and oropharynx. Nl external ear canals without cough reflex   NECK :  without JVD/Nodes/TM/ nl carotid upstrokes bilaterally   LUNGS: no acc muscle use,  Nl contour chest which is clear to A and P bilaterally without cough on insp or exp maneuvers   CV:  RRR  no s3 or murmur or increase in P2, and no edema   ABD: Obese  soft and nontender with nl inspiratory excursion in the supine position. No bruits or organomegaly appreciated, bowel sounds nl  MS:  Nl gait/ ext warm without deformities, calf tenderness, cyanosis or clubbing No obvious joint restrictions  - Left calf appears > R calf s pitting   SKIN: warm and dry without lesions    NEURO:  alert, approp, nl sensorium with  no motor or cerebellar deficits apparent.     I personally reviewed images and agree with radiology impression as follows:   Chest CTa 07/01/18 neg  to segmental level with mild resp motion artifact / no effusion     Assessment   Acute pulmonary embolism without acute cor pulmonale ([place]) Onset symptoms 06/28/2018 with non-pleuritic cp and pos d dimer resolved on xarelto p 2 doses, recurred off in setting of MO and chronic L >R LE swelling  - CTa 03/01/2019 neg to subsegmental level  - Echo 07/07/18 nl     There are 5 different patterns of symptoms in pts with PE and the smaller the clots the harder they are to detect on CTa and  more likely to cause pleuritic CP as they tend to be peripheral and may be missed with 'nl segmental arteries" noted but can't rule out.   Her cp was not classically pleuritic by her hx but given her risk factors for PE and pos d dimer  I would give her the benefit of the doubt.   That is to say:  I  strongly suspect she had multiple small  PE or a conversion /anxiety reaction but not possible to sort these out even in retrospect and her father's dx of dvt weighs more heavily than her husband's PE (may have been source of conversion reaction) in terms of risk factors though MO is the greatest concern here so rec   6 months of xarelto then do venous dopplers then and d/c xarelto if these are neg and refer to hematology for advice re future events/ needs for prophylactic dosing       Morbid (severe) obesity due to excess calories ([place]) Body mass index is 54.89 kg/m.    Lab Results  Component Value Date   TSH 0.61 04/21/2018     Contributing to dvt/perisk/ doe/reviewed the need and the process to achieve and maintain neg calorie balance > defer f/u primary care including intermittently monitoring thyroid status      Total time devoted to counseling  > 50 % of initial 60 min office visit:  review case with pt/ discussion of options/alternatives/ personally creating written customized instructions  in presence of pt  then going over those specific  Instructions directly with the pt including how to use all of  the meds but in particular covering each new medication in detail and the difference between the maintenance= "automatic" meds and the prns using an action plan format for the latter (If this problem/symptom => do that organization reading Left to right).  Please see AVS from this visit for a full list of these instructions which I personally wrote for this pt and  are unique to this visit.      Christinia Gully, MD 07/21/2018

## 2018-07-22 ENCOUNTER — Encounter: Payer: Self-pay | Admitting: Internal Medicine

## 2018-07-22 ENCOUNTER — Telehealth: Payer: Self-pay | Admitting: Internal Medicine

## 2018-07-22 DIAGNOSIS — I2699 Other pulmonary embolism without acute cor pulmonale: Secondary | ICD-10-CM | POA: Insufficient documentation

## 2018-07-22 DIAGNOSIS — Z86711 Personal history of pulmonary embolism: Secondary | ICD-10-CM | POA: Insufficient documentation

## 2018-07-22 NOTE — Telephone Encounter (Signed)
Spoke with patient. She was seen as a consult yesterday for SOB. She forgot to ask yesterday if Xarelto will cause SOB episodes. She stated that her breathing was pretty good yesterday, but today is she is having issues with SOB. Notices the SOB with just walking around. Does not have any other symptoms.   She also wanted to know if it was safe for her to take a low dose aspirin while on the Xarelto. I advised her that I did not think it was a good idea but I would check with MW for her.   MW, please advise. Thanks!

## 2018-07-22 NOTE — Telephone Encounter (Signed)
Its fine

## 2018-07-22 NOTE — Assessment & Plan Note (Addendum)
Onset symptoms 06/28/2018 with non-pleuritic cp and pos d dimer resolved on xarelto p 2 doses, recurred off in setting of MO and chronic L >R LE swelling  - CTa 03/01/2019 neg to subsegmental level  - Echo 07/07/18 nl     There are 5 different patterns of symptoms in pts with PE and the smaller the clots the harder they are to detect on CTa and more likely to cause pleuritic CP as they tend to be peripheral and may be missed with 'nl segmental arteries" noted but can't rule out.   Her cp was not classically pleuritic by her hx but given her risk factors for PE and pos d dimer  I would give her the benefit of the doubt.   That is to say:  I  strongly suspect she had multiple small  PE or a conversion /anxiety reaction but not possible to sort these out even in retrospect and her father's dx of dvt weighs more heavily than her husband's PE (may have been source of conversion reaction) in terms of risk factors though MO is the greatest concern here so rec   6 months of xarelto then do venous dopplers then and d/c xarelto if these are neg and refer to hematology for advice re future events/ needs for prophylactic dosing

## 2018-07-22 NOTE — Telephone Encounter (Signed)
Breathing should gradually improve on xarelto and if it doesn't get back to where it was (we used sob x one flight of steps as standard measurement for her ) then return to clinic in a month  No aspirin on xerelto

## 2018-07-22 NOTE — Telephone Encounter (Signed)
Spoke with patient. She verbalized understanding.   She also had another question. She stated that she just remembered that she took a medication last night called "Mag07". It is a laxative. She read some of the reviews and side effects and wonders if that was the reason for her SOB today. Advised that I didn't believe it would react with the Xarelto but I would double check.   MW, please advise. Thanks!

## 2018-07-22 NOTE — Assessment & Plan Note (Signed)
Body mass index is 54.89 kg/m.   Lab Results  Component Value Date   TSH 0.61 04/21/2018     Contributing to dvt/perisk/ doe/reviewed the need and the process to achieve and maintain neg calorie balance > defer f/u primary care including intermittently monitoring thyroid status      Total time devoted to counseling  > 50 % of initial 60 min office visit:  review case with pt/ discussion of options/alternatives/ personally creating written customized instructions  in presence of pt  then going over those specific  Instructions directly with the pt including how to use all of the meds but in particular covering each new medication in detail and the difference between the maintenance= "automatic" meds and the prns using an action plan format for the latter (If this problem/symptom => do that organization reading Left to right).  Please see AVS from this visit for a full list of these instructions which I personally wrote for this pt and  are unique to this visit.

## 2018-07-22 NOTE — Telephone Encounter (Signed)
Left message for patient to call back  

## 2018-07-23 NOTE — Telephone Encounter (Signed)
Pt has been made aware that Mag07 should not have caused any side effects.Marland Kitchen

## 2018-07-24 ENCOUNTER — Telehealth: Payer: Self-pay

## 2018-07-24 ENCOUNTER — Telehealth: Payer: Self-pay | Admitting: Internal Medicine

## 2018-07-24 ENCOUNTER — Other Ambulatory Visit: Payer: Self-pay | Admitting: Internal Medicine

## 2018-07-24 NOTE — Telephone Encounter (Signed)
Copied from [place] [id number]. Topic: Quick Communication - Rx Refill/Question >> Jul 24, 2018  9:39 AM Selinda Flavin B, NT wrote: **medication not on med list. States that she had a visit with Pulmonary, and they wish for her to continue this for 6 months. States that Dr Melvyn Novas was going to notify Dr Alain Marion.**  Medication: Xarelto  Has the patient contacted their pharmacy? yes (Agent: If no, request that the patient contact the pharmacy for the refill.) (Agent: If yes, when and what did the pharmacy advise?)  Preferred Pharmacy (with phone number or street name): [facility] [place], [place]  Agent: Please be advised that RX refills may take up to 3 business days. We ask that you follow-up with your pharmacy.

## 2018-07-24 NOTE — Telephone Encounter (Signed)
Bridget [name] called patient and will route message to patients PCP has enough medication to last the weekend. [name] does have PE and Dr. Melvyn Novas wants patient to continue Xarelto

## 2018-07-24 NOTE — Telephone Encounter (Signed)
Copied from [place] [id number]. Topic: General - Inquiry >> Jul 24, 2018  9:44 AM Rutherford Nail, NT wrote: Reason for CRM: Patient calling and would like a call back from the nurse regarding her starter pack of Xarelto. States that she is confused by the directions for the 4th week. Please advise.  Patient wanted to make sure this was still same medicine when she switches to one tablet daily---she states she is doing very well on this medicine, no problems with tolerating med, she is feeling much better, she had office visit with pulmonary/dr wert this week and he is recommending she stay on this medicine---patient will reach day 30 before dr plotnikov returns (he is on vacation next week), so patient is calling dr wert's office to see if he will prescribe refills for her, patient advised to call primary care back if dr wert does not want to order med refills

## 2018-07-30 ENCOUNTER — Telehealth: Payer: Self-pay | Admitting: Internal Medicine

## 2018-07-30 MED ORDER — RIVAROXABAN 20 MG PO TABS: 20.0000 mg | ORAL_TABLET | Freq: Every day | ORAL | 5 refills | Status: DC

## 2018-07-30 NOTE — Telephone Encounter (Signed)
Dr Melvyn Novas pt pcp Dr.Plotnikovis on vacation. She is about to complete xarelto starter pack and needs a refill to continue xarelto. Will do a one time refill?    Copied from 07/24/18 phone note:  Patient wanted to make sure this was still same medicine when she switches to one tablet daily---she states she is doing very well on this medicine, no problems with tolerating med, she is feeling much better, she had office visit with pulmonary/dr wert this week and he is recommending she stay on this medicine---patient will reach day 30 before dr plotnikov returns (he is on vacation next week), so patient is calling dr wert's office to see if he will prescribe refills for her, patient advised to call primary care back if dr wert does not want to order med refills

## 2018-07-30 NOTE — Telephone Encounter (Signed)
Ok to refill rx =  xarelto 20 mg one daily x 5 months of refills

## 2018-07-30 NOTE — Telephone Encounter (Signed)
Returned call to patient Confirmed pharmacy [facility] Rx Xarelto 20 mg sent Nothing further needed.

## 2018-08-05 ENCOUNTER — Telehealth: Payer: Self-pay | Admitting: *Deleted

## 2018-08-05 ENCOUNTER — Encounter: Payer: Self-pay | Admitting: Internal Medicine

## 2018-08-05 ENCOUNTER — Ambulatory Visit (INDEPENDENT_AMBULATORY_CARE_PROVIDER_SITE_OTHER): Payer: 59 | Admitting: Internal Medicine

## 2018-08-05 ENCOUNTER — Encounter (HOSPITAL_COMMUNITY): Payer: Self-pay

## 2018-08-05 ENCOUNTER — Telehealth: Payer: Self-pay | Admitting: Internal Medicine

## 2018-08-05 ENCOUNTER — Ambulatory Visit (HOSPITAL_COMMUNITY)
Admission: RE | Admit: 2018-08-05 | Discharge: 2018-08-05 | Disposition: A | Discharge status: Home / Self Care | Payer: 59 | Source: Ambulatory Visit | Attending: Cardiology | Admitting: Cardiology

## 2018-08-05 ENCOUNTER — Other Ambulatory Visit: Payer: Self-pay

## 2018-08-05 VITALS — BP 130/80 | HR 88 | Temp 97.0°F | Ht 64.0 in | Wt 322.4 lb

## 2018-08-05 DIAGNOSIS — N3001 Acute cystitis with hematuria: Secondary | ICD-10-CM | POA: Diagnosis not present

## 2018-08-05 DIAGNOSIS — I2699 Other pulmonary embolism without acute cor pulmonale: Secondary | ICD-10-CM | POA: Insufficient documentation

## 2018-08-05 LAB — URINALYSIS, ROUTINE W REFLEX MICROSCOPIC
Bilirubin Urine: NEGATIVE
Ketones, ur: NEGATIVE
Nitrite: NEGATIVE
Specific Gravity, Urine: 1.02 (ref 1.000–1.030)
Total Protein, Urine: NEGATIVE
Urine Glucose: NEGATIVE
Urobilinogen, UA: 0.2 (ref 0.0–1.0)
pH: 6 (ref 5.0–8.0)

## 2018-08-05 MED ORDER — ACETAMINOPHEN-CODEINE 300-30 MG PO TABS: 0.5000 | ORAL_TABLET | ORAL | 0 refills | Status: DC | PRN

## 2018-08-05 NOTE — Progress Notes (Signed)
ATC, line rings a few times and then goes to busy signal

## 2018-08-05 NOTE — Telephone Encounter (Signed)
Called and spoke with pt who stated she had increased SOB last night 5/19 and also states she has pain in her lungs.  Pt states this morning she does feel better than she did last night but stated her lungs hurt all night long and still has some pain this morning.  Pt has been told by a previous MD to take Codeine for pain and due to the pain, pt took 3 Codeine tablets but stated she did take them as directed on the Rx.  Pt has been doing work outs and moving around as directed but due to the pain, she is unsure if this is something to be expected with the pain due to her previous pulmonary embolism that she had.   Pt also wants to know if Codeine that she has been prescribed by another MD would be okay for her to take when she does have the pain in her lungs like she had or if there is another pain med that she should be taking instead.  Dr. Melvyn Novas, please advise on this for pt. Thanks!

## 2018-08-05 NOTE — Telephone Encounter (Signed)
Yes ok to take codeine but if the cp and sob continue to occur with the exact same pattern as they did before she started xarelto it makes it much less likely that this is due to pe and will need further w/u rather than just refill the codeine.  Ov with all meds in hand if this is the case

## 2018-08-05 NOTE — Telephone Encounter (Signed)
Aware thx

## 2018-08-05 NOTE — Progress Notes (Signed)
ATC, line rang a few times and then turned to busy signal

## 2018-08-05 NOTE — Telephone Encounter (Signed)
Bridget [name] from [facility] calling with call report. Pt had a doppler today neg for DVT.

## 2018-08-05 NOTE — Progress Notes (Unsigned)
Bilateral lower venous has been completed and is negative for DVT.  Preliminary results can be found under CV proc through chart review.  Cira Servant, RVT [facility]

## 2018-08-05 NOTE — Progress Notes (Signed)
Bridget [name], female    DOB: 1976/02/08,   MRN: [id number]   Brief patient profile:  29 yobf never smoker last child born 2018 with nl IUP term with baseline wt p baby around 290lb and able to ex able to yardwork / gardening and able to walk [place] at slower than nl pace / one flight steps  and no trouble at top then [facility] Sunday 06/28/2018 sudden sob at rest assoc with constant L flank pain radiated both both of top of chest post lasting for a few days again not pleuritic in nature then waxed and waned since and better with codeine "just like my husband's PE" >  televist during office hours 06/30/18 > rec labs: pos dimer same day started xarelto and breathing and pain seemed better w/in hours but CTa 07/01/2018  done p ? 2  doses showed no PE >  Took total of 3 doses and stopped it 07/08/18  and 24 h later same problems recurred  and pt xarelto restarted and improved again so referred to pulmonary clinic 07/21/2018 by Dr   Oletha Blend re ? Dx   Father had DVT   Add to initial presentation:  Started Codeine 07/13/18 due to recurrent cp while on xarelto- did not disclose on initial ov that she was still needing as said xarelto "was the only thing that was helping her cp and sob"    History of Present Illness  07/21/2018  Pulmonary/ 1st office eval/Bridget [name]  Chief Complaint  Patient presents with   Pulmonary Consult    Referred by Dr Alain Marion. Pt states started on xartelto based on elevated d dimer but her ct chest was neg for PE. When she was told to stop xarelto she felt SOB so started back on med and feeling much better.   Dyspnea:  Much better now back to baseline including one flight of steps  Cough: none Sleep: R sleeps on r arm  SABA use: none  rec Stay on xarelto    08/05/2018  f/u ov/Bridget [name] re: cp/sob Chief Complaint  Patient presents with   Acute Visit    back and left side pain since last night. She also c/o increased SOB since last night. She states she also has noticed tingling in her  legs.   At  time of last ov was still taking codeine 30 mg one half twice daily  Dyspnea on ex:  Doing more at home now than was at last ov = ex 10 min x 3 x daily  Cough: no  Sleeping: fine until codeine ran out then Onset sitting watching tv night prior to OV   same pain pattern upper back both sides equal and L flank / non-pleurtic  Assoc with resting sob.    No obvious day to day or daytime variability or assoc excess/ purulent sputum or mucus plugs or hemoptysis orchest tightness, subjective wheeze or overt sinus or hb symptoms.     Also denies any obvious fluctuation of symptoms with weather or environmental changes or other aggravating or alleviating factors except as outlined above   No unusual exposure hx or h/o childhood pna/ asthma or knowledge of premature birth.  Current Allergies, Complete Past Medical History, Past Surgical History, Family History, and Social History were reviewed in [employer] record.  ROS  The following are not active complaints unless bolded Hoarseness, sore throat, dysphagia, dental problems, itching, sneezing,  nasal congestion or discharge of excess mucus or purulent secretions, ear ache,  fever, chills, sweats, unintended wt loss or wt gain, classically pleuritic or exertional cp,  orthopnea pnd or arm/hand swelling  or leg swelling, presyncope, palpitations, abdominal pain, anorexia, nausea, vomiting, diarrhea  or change in bowel habits or change in bladder habits, change in stools or change in urine, dysuria, hematuria,  rash, arthralgias, visual complaints, headache, numbness, weakness or ataxia or problems with walking or coordination,  change in mood -says anxious and depressed - or  memory.        Current Meds  Medication Sig   Acetaminophen-Codeine (TYLENOL/CODEINE #3) 300-30 MG tablet Take 0.5-1 tablets by mouth every 6 (six) hours as needed for pain.   cetirizine (ZYRTEC) 10 MG tablet Take 10 mg by mouth daily as needed  for allergies.   Cholecalciferol (VITAMIN D3) 1.25 MG (50000 UT) CAPS Take 1 capsule by mouth once a week.   escitalopram (LEXAPRO) 5 MG tablet Take 1 tablet (5 mg total) by mouth daily.   Multiple Vitamin (MULTIVITAMIN) capsule Take 1 capsule by mouth daily.   Potassium Gluconate 550 MG TABS Take 1 tablet by mouth daily.   rivaroxaban (XARELTO) 20 MG TABS tablet Take 1 tablet (20 mg total) by mouth daily with supper.   triamterene-hydrochlorothiazide (MAXZIDE-25) 37.5-25 MG tablet Take 1 tablet by mouth daily. Overdue for annual appt must see MD for future refills   vitamin B-12 (CYANOCOBALAMIN) 1000 MCG tablet Take 1 tablet (1,000 mcg total) by mouth daily.             Objective:      amb bf nad mod anxious   Wt Readings from Last 3 Encounters:  08/05/18 (!) 322 lb 6.4 oz (146.2 kg)  07/21/18 (!) 319 lb 12.8 oz (145.1 kg)  04/21/18 (!) 336 lb (152.4 kg)     Vital signs reviewed - Note on arrival 02 sats  98% on RA    HEENT: nl dentition, turbinates bilaterally, and oropharynx. Nl external ear canals without cough reflex   NECK :  without JVD/Nodes/TM/ nl carotid upstrokes bilaterally   LUNGS: no acc muscle use,  Nl contour chest which is clear to A and P bilaterally without cough on insp or exp maneuvers   CV:  RRR  no s3 or murmur or increase in P2, and no edema   ABD:  soft and nontender with nl inspiratory excursion in the supine position. No bruits or organomegaly appreciated, bowel sounds nl  MS:  Nl gait/ ext warm without deformities, calf tenderness, cyanosis or clubbing No obvious joint restrictions   SKIN: warm and dry without lesions    NEURO:  alert, approp, nl sensorium with  no motor or cerebellar deficits apparent.       Labs ordered 08/05/2018  Venous doppler  U/a  08/05/2018   Pos rbc's but pt still  on period       Assessment

## 2018-08-05 NOTE — Progress Notes (Signed)
[id number]

## 2018-08-05 NOTE — Telephone Encounter (Signed)
Called and spoke with pt letting her know the info stated by MW and asked her if she feels like the pain is the same exact pattern as prior to beginning the Xarelto and pt stated that it is. Pt stated that the pain originally began in her side but then she started having the pain in her lungs.  Pt stated that she would like to come into office for further eval. appt has been scheduled for pt today 5/20 with MW at 11am. Nothing further needed.

## 2018-08-05 NOTE — Patient Instructions (Signed)
Please remember to go to the lab department   for your tests - we will call you with the results when they are available.      For pain or short of breath take tylenol with codeine up to 1 every 4 hours if needed    Please see patient coordinator before you leave today  to schedule venous dopplers and I will call you with results

## 2018-08-06 ENCOUNTER — Encounter: Payer: Self-pay | Admitting: Internal Medicine

## 2018-08-06 ENCOUNTER — Telehealth: Payer: Self-pay | Admitting: Internal Medicine

## 2018-08-06 ENCOUNTER — Telehealth: Payer: Self-pay

## 2018-08-06 DIAGNOSIS — R3129 Other microscopic hematuria: Secondary | ICD-10-CM

## 2018-08-06 NOTE — Assessment & Plan Note (Signed)
She had hematuria on 07/13/18 @  prior w/u for L flank pain and still has it now but is on menstrual cycle and denies flank pain so I did not start any abx today but ? Could some of her flank pain be related to renal colic?   I had advised on how to do Clean catch but still feel likely this is contaminated and defer w/u to Dr Alain Marion re repeat or refer to urology.

## 2018-08-06 NOTE — Telephone Encounter (Signed)
Let her know I understand and that's why I gave her a refill on the tylenol #3 to help until we can figure things out but I cannot identify a lung problem causing her toubles so deferred all care going forward to Dr Alain Marion and let him know about the urine issues as well and he can take it from there and do refills if appropriate

## 2018-08-06 NOTE — Telephone Encounter (Signed)
Called and spoke with pt and stated to her the info from [facility]. Pt expressed understanding. Nothing further needed.

## 2018-08-06 NOTE — Telephone Encounter (Signed)
Copied from [place] [id number]. Topic: Quick Communication - See Telephone Encounter >> Aug 06, 2018  3:09 PM Erick Blinks wrote: Pt called back to state that she is still in a great deal of pain. Her pulmonologist has referred her back to PCP to review her urinalysis. Pt declined to speak with nurse triage, "Kidney is hurting. Shortness of breath. Has symptoms of UTI"  Please advise. [phone] CRM for notification. See Telephone encounter for: 08/06/18.

## 2018-08-06 NOTE — Telephone Encounter (Signed)
Pt call to say Dr Melvyn Novas ordered a urine and and it shows she has an infection and said she need a antibiotic and this is the reason she is calling, there are notes in there from Dr Melvyn Novas office. She said she in pain and feels like she did not get any help from the lung doctor.  Results are in chart, please advise

## 2018-08-06 NOTE — Assessment & Plan Note (Signed)
Body mass index is 55.34 kg/m.  -  trending up  Lab Results  Component Value Date   TSH 0.61 04/21/2018     Contributing to gerd risk/ doe/reviewed the need and the process to achieve and maintain neg calorie balance > defer f/u primary care including intermittently monitoring thyroid status

## 2018-08-06 NOTE — Telephone Encounter (Signed)
  Pt aware of results: she is not satisfied. She is still having left side pain. She is still having lung pain She feels like no one is really listening to her. She is concerned about results of u/a and thinks she needs a abx. She feels like she keeps running over here but is not getting any help She is agreeable with referral to urologist  She says her period has stopped. Please advise.     Notes recorded by Tanda Rockers, MD on 08/05/2018 at 4:29 PM EDT Call patient : Studies are inclonclusive - still may have a kidney problem so repeat when no longer on period or consider seeing a urologist if flank pain recurs > defer this w/u to Dr Alain Marion as I don't think there is an active lung problem here.    Color, Urine Yellow;Lt. Yellow;Straw;Dark Yellow;Amber;Green;Red;Brown YELLOW  YELLOW  YELLOW  YELLOW R  AMBERAbnormal  R, CM YELLOW R  APPearance Clear;Turbid;Slightly Cloudy;Cloudy Sl CloudyAbnormal   CloudyAbnormal   CLEAR  CLEAR R  HAZYAbnormal  R CLEAR R  Specific Gravity, Urine 1.000 - 1.030 1.020  1.010  1.025  1.015   >1.030High  R 1.015   pH 5.0 - 8.0 6.0  6.5  5.5  8.0   6.0  7.5   Total Protein, Urine Negative NEGATIVE  NEGATIVE  NEGATIVE  NEGATIVE    NEGATIVE   Urine Glucose Negative NEGATIVE  NEGATIVE  NEGATIVE  NEGATIVE    NEGATIVE   Ketones, ur Negative NEGATIVE  NEGATIVE  NEGATIVE  NEGATIVE   15Abnormal  R NEGATIVE   Bilirubin Urine Negative NEGATIVE  SMALLAbnormal   NEGATIVE  NEGATIVE   NEGATIVE R NEGATIVE   Hgb urine dipstick Negative LARGEAbnormal   LARGEAbnormal   NEGATIVE  NEGATIVE   LARGEAbnormal  R NEGATIVE   Urobilinogen, UA 0.0 - 1.0 0.2  0.2  0.2  1.0    1.0   Leukocytes,Ua Negative TRACEAbnormal   NEGATIVE        Nitrite Negative NEGATIVE  NEGATIVE  NEGATIVE  NEGATIVE   NEGATIVE R NEGATIVE   WBC, UA 0-2/hpf 3-6/hpfAbnormal   0-2/hpf    NONE SEEN R    RBC / HPF 0-2/hpf 7-10/hpfAbnormal   11-20/hpfAbnormal     TOO NUMEROUS TO COUNT R    Mucus, UA None Presence  ofAbnormal          Squamous Epithelial / LPF Rare(0-4/hpf) Many(>10/hpf)Abnormal   Few(5-10/hpf)Abnormal     0-5Abnormal  R    Bacteria, UA None Rare(<10/hpf)Abnormal      MANYAbnormal  R    Granular Casts, UA   Presence ofAbnormal  R       Leukocytes, UA    NEGATIVE R NEGATIVE R  NEGATIVE R NEGATIVE R  Urine-Other      TALC CRYSTALS PRESENT     Glucose, UA       NEGATIVE R   Protein, ur       NEGATIVE R   Resulting Agency  [place] [facility] [place] [facility] [place] [facility] [place] [facility] [place] [facility] [place] [facility] [place] [facility]      Specimen Collected: 08/05/18 12:25  Last Resulted: 08/05/18 16:19

## 2018-08-06 NOTE — Assessment & Plan Note (Addendum)
Onset symptoms 06/28/2018 with non-pleuritic cp and pos d dimer resolved on xarelto p 2 doses, recurred off in setting of MO and chronic L >R LE swelling  - CTa 03/01/2019 neg to subsegmental level  - Echo 07/07/18 nl   - Venous dopplers neg bilaterally 08/05/2018  - 08/05/2018   Walked RA  2 laps @  approx 230ft each @ fast pace  stopped due to  End of study, min sob with sats 98% at end and pulse 126    Extended discussion with pt lasting 25 min of an acute 40 min ov   1) I did not realize when she said "xarelto is the only thing that works" at last ov that she was still requiring codeine for pain and so my original dx is not longer as firm.  2) impossible to completely r/o  PTE given her fm hx and MO and pos d dimer but I think now it has nothing to do with her ongoing symptoms  3) sob at rest not reproduced with exertion is typical of panic disorder, not PE or for that matter any cardiopulmonary source for the pain   4) the pain originally was described in RLQ per records, now L flank and sym bilateral upper cp, none pleuritic in nature so need to start exploring other options but could well be psychosomatic - the only loose end is the hemturia (see sep a/p)  5) low threshold to either stop the xarelto or refer to hematology re risk of recurrent pe off the med as pos fm hx and MO major risk factors and probably need hypercoagulability w/u if stop it permanently.   6) I gave her a limited supply of Tyl #3 until I had a chance to review all records p the onset of her symptoms and put the symptoms together with the response to therapy but realize now they don't really hold up and will not be renewing her codeine and referring back to Dr Mamie Nick for f/u   7) would strongly consider conversion reaction/ depression here - she disclosed to nursing she is not taking lexapro as rec but I did not specifically address this issue today   Pulmonary f/u is prn

## 2018-08-06 NOTE — Telephone Encounter (Signed)
There is a microscopic amount of blood in the urine.  Is she on her cycle? Does she have any flank pain? Thanks

## 2018-08-07 ENCOUNTER — Ambulatory Visit: Payer: Self-pay

## 2018-08-07 ENCOUNTER — Ambulatory Visit (INDEPENDENT_AMBULATORY_CARE_PROVIDER_SITE_OTHER): Payer: 59 | Admitting: Psychology

## 2018-08-07 DIAGNOSIS — F4323 Adjustment disorder with mixed anxiety and depressed mood: Secondary | ICD-10-CM

## 2018-08-07 MED ORDER — CEPHALEXIN 500 MG PO CAPS: 1000.0000 mg | ORAL_CAPSULE | Freq: Two times a day (BID) | ORAL | 0 refills | Status: AC

## 2018-08-07 NOTE — Telephone Encounter (Signed)
Incoming cal  From Patient   Who states that she called her pharmacy to see if her medication was ready .  Pharmacy at [facility]  location states that it was not there.    Called  [place] location states that it was at their location.  Will be ready before the store  Closes.  Patient voices understanding.

## 2018-08-07 NOTE — Telephone Encounter (Signed)
Pt states she ended her cycle the day before having the UA. She is also experiencing left flank pain.

## 2018-08-07 NOTE — Telephone Encounter (Signed)
LM notifying pt

## 2018-08-07 NOTE — Telephone Encounter (Signed)
Microscopic blood in the urine is likely related to her recent menstrual cycle We will treat empirically with Keflex-prescription emailed I will order a kidney ultrasound to rule out kidney stones. Thank you

## 2018-08-07 NOTE — Telephone Encounter (Signed)
Tried to contact the office 3x, it went to vm. Pt called again regarding her pain. She is very upset because she has not been contacted. She states she is in extreme pain. She states she would not like to be transferred to nurse triage because she is  not looking for medical advice but the results back to her labs. She states she is frustrated because she had to go through this process yesterday as well. She is asking to be contacted before the long weekend.  Please contact pt and advise.

## 2018-08-13 ENCOUNTER — Ambulatory Visit
Admission: RE | Admit: 2018-08-13 | Discharge: 2018-08-13 | Disposition: A | Discharge status: Home / Self Care | Payer: 59 | Source: Ambulatory Visit | Attending: Internal Medicine | Admitting: Internal Medicine

## 2018-08-13 DIAGNOSIS — R3129 Other microscopic hematuria: Secondary | ICD-10-CM

## 2018-08-14 MED ORDER — TRAMADOL HCL 50 MG PO TABS: 25.0000 mg | ORAL_TABLET | Freq: Four times a day (QID) | ORAL | 0 refills | Status: AC | PRN

## 2018-08-14 NOTE — Telephone Encounter (Addendum)
I Emailed a prescription for tramadol to [facility] on [address]

## 2018-08-14 NOTE — Telephone Encounter (Signed)
Pt was unable to come into the office today due to being at work. OV made for Wednesday. Is there anything you recommend for pt to help with pain until her appt? Pt notified you were out of office today but wanted answer from only you

## 2018-08-14 NOTE — Telephone Encounter (Signed)
Kidney ultrasound shows no kidney stones.  She does have a benign cyst in her right kidney-this is a very common finding and does not cause any pain or any problems.  There is no explanation for pain.  She most likely needs to be evaluated in person.

## 2018-08-14 NOTE — Telephone Encounter (Signed)
Please review patients [place] in Dr. Enis Slipper absence today.

## 2018-08-14 NOTE — Addendum Note (Signed)
Addended by: Cassandria Anger on: 08/14/2018 05:02 PM   Modules accepted: Orders

## 2018-08-14 NOTE — Telephone Encounter (Signed)
Pt is calling and requesting betty to return her call. Pt would like results of  Renal ultrasound done at [facility] yesterday. Pt is still and pain and does not want to go though weekend.

## 2018-08-15 ENCOUNTER — Telehealth: Payer: 59 | Admitting: Nurse Practitioner

## 2018-08-15 DIAGNOSIS — N3000 Acute cystitis without hematuria: Secondary | ICD-10-CM

## 2018-08-15 NOTE — Progress Notes (Signed)
Based on what you shared with me it looks like you have recurrent UTI or pyelonephritis due to associated back pain,that should be evaluated in a face to face office visit. You will need to have urinalysis and urine culture for proper treatment.    NOTE: If you entered your credit card information for this eVisit, you will not be charged. You may see a "hold" on your card for the $30 but that hold will drop off and you will not have a charge processed.  If you are having a true medical emergency please call 911.  If you need an urgent face to face visit, [facility] has four urgent care centers for your convenience.  If you need care fast and have a high deductible or no insurance consider:   [email or web address] to reserve your spot online an avoid wait times  [facility] [address], [address] [place], [place] [address] 8 am to 8 pm Monday-Friday 10 am to 4 pm Saturday-Sunday *Across the street from [employer]  [place], [address] 8 am to 5 pm Monday-Friday * In the [facility] on the [facility]   The following sites will take your  insurance:  . [facility] [place] a Provider at this Location  [address] [place], [place] [address] . 10 am to 8 pm Monday-Friday . 12 pm to 8 pm Saturday-Sunday   . [facility] at [place] a Provider at this Location  [address], [place], [place] [address] . 8 am to 8 pm Monday-Friday . 9 am to 6 pm Saturday . 11 am to 6 pm Sunday   . [facility] at [place] Get Driving Directions  [address] [place], [place] [address] . 8 am to 8 pm Monday-Friday . 8 am to 4 pm Saturday-Sunday   Your e-visit answers were reviewed by a board certified advanced clinical practitioner to  complete your personal care plan.

## 2018-08-17 NOTE — Telephone Encounter (Signed)
Pt.notified

## 2018-08-19 ENCOUNTER — Other Ambulatory Visit: Payer: Self-pay

## 2018-08-19 ENCOUNTER — Ambulatory Visit (INDEPENDENT_AMBULATORY_CARE_PROVIDER_SITE_OTHER): Payer: 59 | Admitting: Internal Medicine

## 2018-08-19 ENCOUNTER — Encounter: Payer: Self-pay | Admitting: Internal Medicine

## 2018-08-19 ENCOUNTER — Other Ambulatory Visit (INDEPENDENT_AMBULATORY_CARE_PROVIDER_SITE_OTHER): Payer: 59

## 2018-08-19 ENCOUNTER — Ambulatory Visit: Payer: 59 | Admitting: Psychology

## 2018-08-19 VITALS — BP 128/72 | HR 75 | Temp 98.5°F | Ht 64.0 in | Wt 322.0 lb

## 2018-08-19 DIAGNOSIS — M545 Low back pain, unspecified: Secondary | ICD-10-CM

## 2018-08-19 DIAGNOSIS — R0609 Other forms of dyspnea: Secondary | ICD-10-CM

## 2018-08-19 DIAGNOSIS — I2699 Other pulmonary embolism without acute cor pulmonale: Secondary | ICD-10-CM | POA: Diagnosis not present

## 2018-08-19 DIAGNOSIS — E538 Deficiency of other specified B group vitamins: Secondary | ICD-10-CM | POA: Diagnosis not present

## 2018-08-19 DIAGNOSIS — N390 Urinary tract infection, site not specified: Secondary | ICD-10-CM

## 2018-08-19 DIAGNOSIS — F419 Anxiety disorder, unspecified: Secondary | ICD-10-CM

## 2018-08-19 LAB — URINALYSIS
Bilirubin Urine: NEGATIVE
Ketones, ur: NEGATIVE
Leukocytes,Ua: NEGATIVE
Nitrite: NEGATIVE
Specific Gravity, Urine: >=1.03 — AB (ref 1.000–1.030)
Total Protein, Urine: NEGATIVE
Urine Glucose: NEGATIVE
Urobilinogen, UA: 0.2 (ref 0.0–1.0)
pH: 5.5 (ref 5.0–8.0)

## 2018-08-19 MED ORDER — BUDESONIDE-FORMOTEROL FUMARATE 160-4.5 MCG/ACT IN AERO: 2.0000 | INHALATION_SPRAY | Freq: Two times a day (BID) | RESPIRATORY_TRACT | 3 refills | Status: DC

## 2018-08-19 NOTE — Assessment & Plan Note (Signed)
Unclear etiology.  She is on Xarelto for presumed PE.  Will treat empirically with Symbicort.  Continue with Lexapro anxiety treatments.  Continue counseling with her psychologist  Onset symptoms 06/28/2018 with non-pleuritic cp and pos d dimer resolved on xarelto p 2 doses, recurred off in setting of MO and chronic L >R LE swelling  - CTa 03/01/2019 neg to subsegmental level  - Echo 07/07/18 nl   - Venous dopplers neg bilaterally 08/05/2018  - 08/05/2018   Walked RA  2 laps @  approx 249ft each @ fast pace  stopped due to  End of study, min sob with sats 98% at end and pulse 126   6/20 per Dr Wert:"1) I did not realize when she said "xarelto is the only thing that works" at last ov that she was still requiring codeine for pain and so my original dx is not longer as firm.  2) impossible to completely r/o  PTE given her fm hx and MO and pos d dimer but I think now it has nothing to do with her ongoing symptoms  3) sob at rest not reproduced with exertion is typical of panic disorder, not PE or for that matter any cardiopulmonary source for the pain   4) the pain originally was described in RLQ per records, now L flank and sym bilateral upper cp, none pleuritic in nature so need to start exploring other options but could well be psychosomatic - the only loose end is the hemturia (see sep a/p)  5) low threshold to either stop the xarelto or refer to hematology re risk of recurrent pe off the med as pos fm hx and MO major risk factors and probably need hypercoagulability w/u if stop it permanently.   6) I gave her a limited supply of Tyl #3 until I had a chance to review all records p the onset of her symptoms and put the symptoms together with the response to therapy but realize now they don't really hold up and will not be renewing her codeine and referring back to Dr Mamie Nick for f/u   7) would strongly consider conversion reaction/ depression here - she disclosed to nursing she is not taking lexapro  as rec but I did not specifically address this issue today"

## 2018-08-19 NOTE — Assessment & Plan Note (Signed)
Repeat UA 

## 2018-08-19 NOTE — Assessment & Plan Note (Signed)
On B12 

## 2018-08-19 NOTE — Assessment & Plan Note (Addendum)
Possible PE.  Continue Xarelto for now.  Follow-up with Dr Melvyn Novas Hem ref Dr Marin Olp  Onset symptoms 06/28/2018 with non-pleuritic cp and pos d dimer resolved on xarelto p 2 doses, recurred off in setting of MO and chronic L >R LE swelling  - CTa 03/01/2019 neg to subsegmental level  - Echo 07/07/18 nl   - Venous dopplers neg bilaterally 08/05/2018  - 08/05/2018   Walked RA  2 laps @  approx 241ft each @ fast pace  stopped due to  End of study, min sob with sats 98% at end and pulse 126   6/20 per Dr Wert:"1) I did not realize when she said "xarelto is the only thing that works" at last ov that she was still requiring codeine for pain and so my original dx is not longer as firm.  2) impossible to completely r/o  PTE given her fm hx and MO and pos d dimer but I think now it has nothing to do with her ongoing symptoms  3) sob at rest not reproduced with exertion is typical of panic disorder, not PE or for that matter any cardiopulmonary source for the pain   4) the pain originally was described in RLQ per records, now L flank and sym bilateral upper cp, none pleuritic in nature so need to start exploring other options but could well be psychosomatic - the only loose end is the hemturia (see sep a/p)  5) low threshold to either stop the xarelto or refer to hematology re risk of recurrent pe off the med as pos fm hx and MO major risk factors and probably need hypercoagulability w/u if stop it permanently.   6) I gave her a limited supply of Tyl #3 until I had a chance to review all records p the onset of her symptoms and put the symptoms together with the response to therapy but realize now they don't really hold up and will not be renewing her codeine and referring back to Dr Mamie Nick for f/u   7) would strongly consider conversion reaction/ depression here - she disclosed to nursing she is not taking lexapro as rec but I did not specifically address this issue today"

## 2018-08-19 NOTE — Assessment & Plan Note (Signed)
Severe situational anxiety.  Continue with Lexapro.  She will have to take time off work to help her to recover

## 2018-08-19 NOTE — Progress Notes (Signed)
Subjective:  Patient ID: Bridget [name], female    DOB: 1975/05/31  Age: 43 y.o. MRN: [id number]  CC: No chief complaint on file.   HPI Bridget [name] presents for PE, dyspnea, anxiety and severe stress at home.  She has been working from home full-time.  She has been very anxious, crying, fearful of dying.  She is caring for 3 children by herself.  She is exhausted.  She is doing better on Xarelto.  She is very anxious about her flank pain.  She is worried about her kidney cyst and was recently found on ultrasound.  She has been talking to her counselor, Dennison Bulla with our behavioral medicine.  Lexapro seems to be helpful. She is very worried about COVID-19 and is wearing 2 masks today  Outpatient Medications Prior to Visit  Medication Sig Dispense Refill  . Acetaminophen-Codeine (TYLENOL/CODEINE #3) 300-30 MG tablet Take 0.5-1 tablets by mouth every 4 (four) hours as needed for pain. 40 tablet 0  . cetirizine (ZYRTEC) 10 MG tablet Take 10 mg by mouth daily as needed for allergies.    . Cholecalciferol (VITAMIN D3) 1.25 MG (50000 UT) CAPS Take 1 capsule by mouth once a week. 8 capsule 0  . escitalopram (LEXAPRO) 5 MG tablet Take 1 tablet (5 mg total) by mouth daily. 30 tablet 5  . Multiple Vitamin (MULTIVITAMIN) capsule Take 1 capsule by mouth daily.    . Potassium Gluconate 550 MG TABS Take 1 tablet by mouth daily.    . rivaroxaban (XARELTO) 20 MG TABS tablet Take 1 tablet (20 mg total) by mouth daily with supper. 30 tablet 5  . traMADol (ULTRAM) 50 MG tablet Take 0.5-1 tablets (25-50 mg total) by mouth every 6 (six) hours as needed for up to 5 days for severe pain. 20 tablet 0  . triamterene-hydrochlorothiazide (MAXZIDE-25) 37.5-25 MG tablet Take 1 tablet by mouth daily. Overdue for annual appt must see MD for future refills 90 tablet 3  . vitamin B-12 (CYANOCOBALAMIN) 1000 MCG tablet Take 1 tablet (1,000 mcg total) by mouth daily. 100 tablet 3   No facility-administered  medications prior to visit.     ROS: Review of Systems  Constitutional: Positive for fatigue. Negative for activity change, appetite change, chills and unexpected weight change.  HENT: Negative for congestion, mouth sores and sinus pressure.   Eyes: Negative for visual disturbance.  Respiratory: Positive for chest tightness and shortness of breath. Negative for cough and wheezing.   Cardiovascular: Negative for chest pain, palpitations and leg swelling.  Gastrointestinal: Negative for abdominal pain and nausea.  Genitourinary: Positive for flank pain. Negative for difficulty urinating, frequency, genital sores, hematuria and vaginal pain.  Musculoskeletal: Negative for back pain, gait problem and neck stiffness.  Skin: Negative for pallor and rash.  Neurological: Negative for dizziness, tremors, weakness, numbness and headaches.  Psychiatric/Behavioral: Positive for behavioral problems, decreased concentration, dysphoric mood and sleep disturbance. Negative for confusion, self-injury and suicidal ideas. The patient is nervous/anxious.     Objective:  BP 128/72 (BP Location: Right Arm, Patient Position: Sitting, Cuff Size: Large)   Pulse 75   Temp 98.5 F (36.9 C) (Oral)   Ht 5\' 4"  (1.626 m)   Wt (!) 322 lb (146.1 kg)   SpO2 96%   BMI 55.27 kg/m   BP Readings from Last 3 Encounters:  08/19/18 128/72  08/05/18 130/80  07/21/18 132/84    Wt Readings from Last 3 Encounters:  08/19/18 (!) 322 lb (146.1 kg)  08/05/18 (!) 322 lb 6.4 oz (146.2 kg)  07/21/18 (!) 319 lb 12.8 oz (145.1 kg)    Physical Exam Constitutional:      General: She is not in acute distress.    Appearance: She is well-developed.  HENT:     Head: Normocephalic.     Right Ear: External ear normal.     Left Ear: External ear normal.     Nose: Nose normal.  Eyes:     General:        Right eye: No discharge.        Left eye: No discharge.     Conjunctiva/sclera: Conjunctivae normal.     Pupils: Pupils  are equal, round, and reactive to light.  Neck:     Musculoskeletal: Normal range of motion and neck supple.     Thyroid: No thyromegaly.     Vascular: No JVD.     Trachea: No tracheal deviation.  Cardiovascular:     Rate and Rhythm: Normal rate and regular rhythm.     Heart sounds: Normal heart sounds.  Pulmonary:     Effort: No respiratory distress.     Breath sounds: No stridor. No wheezing.  Abdominal:     General: Bowel sounds are normal. There is no distension.     Palpations: Abdomen is soft. There is no mass.     Tenderness: There is no abdominal tenderness. There is no guarding or rebound.  Musculoskeletal:        General: No tenderness.  Lymphadenopathy:     Cervical: No cervical adenopathy.  Skin:    Findings: No erythema or rash.  Neurological:     Mental Status: She is oriented to person, place, and time.     Cranial Nerves: No cranial nerve deficit.     Motor: No abnormal muscle tone.     Coordination: Coordination normal.     Deep Tendon Reflexes: Reflexes normal.  Psychiatric:        Thought Content: Thought content normal.        Judgment: Judgment normal.   Obese Anxious and tearful Both legs without swelling Calves nontender  I personally provided Symbicort inhaler with a spacer use teaching. After the teaching patient was able to demonstrate it's use effectively. All questions were answered  A complex situation FTF>40 min   Lab Results  Component Value Date   WBC 8.1 06/30/2018   HGB 14.1 06/30/2018   HCT 41.4 06/30/2018   PLT 353.0 06/30/2018   GLUCOSE 102 (H) 07/13/2018   CHOL 153 04/21/2018   TRIG 88.0 04/21/2018   HDL 39.40 04/21/2018   LDLCALC 96 04/21/2018   ALT 8 07/13/2018   AST 12 07/13/2018   NA 140 07/13/2018   K 3.3 (L) 07/13/2018   CL 102 07/13/2018   CREATININE 0.75 07/13/2018   BUN 12 07/13/2018   CO2 26 07/13/2018   TSH 0.61 04/21/2018   HGBA1C 5.3 10/29/2017    US Renal  Result Date: 08/14/2018 CLINICAL DATA:   Initial evaluation for microscopic hematuria. Left flank pain. EXAM: RENAL / URINARY TRACT ULTRASOUND COMPLETE COMPARISON:  None. FINDINGS: Right Kidney: Renal measurements: 11.4 x 4.7 x 7.0 cm = volume: 200 mL . Echogenicity within normal limits. No shadowing echogenic calculi or hydronephrosis. 1.8 x 1.7 x 1.6 cm simple cyst present at the upper pole. Left Kidney: Renal measurements: 11.6 x 4.2 x 4.9 cm = volume: 119 mL. Echogenicity within normal limits. No shadowing echogenic calculi. No hydronephrosis. No focal renal  lesion. Bladder: Appears normal for degree of bladder distention. IMPRESSION: 1. No sonographic evidence for nephrolithiasis or hydronephrosis. 2. 1.8 cm simple cyst at the upper pole the right kidney. Electronically Signed   By: Jeannine Boga M.D.   On: 08/14/2018 03:14    Assessment & Plan:   There are no diagnoses linked to this encounter.   No orders of the defined types were placed in this encounter.    Follow-up: No follow-ups on file.  Walker Kehr, MD

## 2018-08-20 ENCOUNTER — Ambulatory Visit (INDEPENDENT_AMBULATORY_CARE_PROVIDER_SITE_OTHER): Payer: 59 | Admitting: Psychology

## 2018-08-20 DIAGNOSIS — F4321 Adjustment disorder with depressed mood: Secondary | ICD-10-CM | POA: Diagnosis not present

## 2018-08-31 ENCOUNTER — Other Ambulatory Visit: Payer: Self-pay | Admitting: Hematology

## 2018-08-31 DIAGNOSIS — R7989 Other specified abnormal findings of blood chemistry: Secondary | ICD-10-CM

## 2018-09-02 ENCOUNTER — Ambulatory Visit (INDEPENDENT_AMBULATORY_CARE_PROVIDER_SITE_OTHER): Payer: 59 | Admitting: Psychology

## 2018-09-02 DIAGNOSIS — F4323 Adjustment disorder with mixed anxiety and depressed mood: Secondary | ICD-10-CM

## 2018-09-03 ENCOUNTER — Telehealth: Payer: Self-pay | Admitting: Internal Medicine

## 2018-09-03 DIAGNOSIS — I2699 Other pulmonary embolism without acute cor pulmonale: Secondary | ICD-10-CM

## 2018-09-03 NOTE — Telephone Encounter (Signed)
Please advise, I only see one RX and it was for 20 tablets

## 2018-09-03 NOTE — Telephone Encounter (Signed)
Tramadol.  Pt states that she doesn't want to take her children into [facility] so she had to get her prescriptions transferred.  Pt has gotten all of her medication to a pharmacy that has a drive through.  But they will not transfer over the Tramadol and she would like this to be called in to  [place], [place] - [place] [phone] (Phone) [phone] (Fax)

## 2018-09-04 ENCOUNTER — Ambulatory Visit: Payer: 59 | Admitting: Hematology

## 2018-09-04 ENCOUNTER — Encounter: Payer: Self-pay | Admitting: Family

## 2018-09-04 ENCOUNTER — Telehealth: Payer: Self-pay | Admitting: Internal Medicine

## 2018-09-04 ENCOUNTER — Inpatient Hospital Stay: Payer: 59

## 2018-09-04 ENCOUNTER — Ambulatory Visit (INDEPENDENT_AMBULATORY_CARE_PROVIDER_SITE_OTHER): Payer: 59 | Admitting: Family

## 2018-09-04 DIAGNOSIS — R0989 Other specified symptoms and signs involving the circulatory and respiratory systems: Secondary | ICD-10-CM

## 2018-09-04 NOTE — Progress Notes (Signed)
Bridget [name] is a 43 y.o. female with the following history as recorded in EpicCare:  Patient Active Problem List   Diagnosis Date Noted  . Pulmonary embolism ([place]) 07/22/2018  . Anxiety 07/13/2018  . Arm swelling 07/06/2018  . DOE (dyspnea on exertion) 06/30/2018  . HTN (hypertension) 10/29/2017  . Reactive depression (situational) 10/29/2017  . Pruritic condition 09/25/2016  . Urticaria 09/25/2016  . Anemia 04/23/2016  . Term pregnancy 03/20/2016  . Well adult exam 05/06/2014  . Otitis media 12/13/2011  . Fatigue 12/13/2011  . B12 deficiency 12/13/2011  . Vitamin D deficiency 12/13/2011  . Palpitations 08/06/2010  . Syncope and collapse 08/06/2010  . EDEMA 10/14/2007  . UTI 07/21/2007  . LOW BACK PAIN 07/21/2007  . ELEVATED BP 07/21/2007  . Morbid (severe) obesity due to excess calories ([place]) 11/15/2006  . Migraines 11/15/2006    Current Outpatient Medications  Medication Sig Dispense Refill  . Acetaminophen-Codeine (TYLENOL/CODEINE #3) 300-30 MG tablet Take 0.5-1 tablets by mouth every 4 (four) hours as needed for pain. 40 tablet 0  . budesonide-formoterol (SYMBICORT) 160-4.5 MCG/ACT inhaler Inhale 2 puffs into the lungs 2 (two) times daily. 3 Inhaler 3  . cetirizine (ZYRTEC) 10 MG tablet Take 10 mg by mouth daily as needed for allergies.    . Cholecalciferol (VITAMIN D3) 1.25 MG (50000 UT) CAPS Take 1 capsule by mouth once a week. 8 capsule 0  . escitalopram (LEXAPRO) 5 MG tablet Take 1 tablet (5 mg total) by mouth daily. 30 tablet 5  . Multiple Vitamin (MULTIVITAMIN) capsule Take 1 capsule by mouth daily.    . Potassium Gluconate 550 MG TABS Take 1 tablet by mouth daily.    . rivaroxaban (XARELTO) 20 MG TABS tablet Take 1 tablet (20 mg total) by mouth daily with supper. 30 tablet 5  . triamterene-hydrochlorothiazide (MAXZIDE-25) 37.5-25 MG tablet Take 1 tablet by mouth daily. Overdue for annual appt must see MD for future refills 90 tablet 3  . vitamin B-12  (CYANOCOBALAMIN) 1000 MCG tablet Take 1 tablet (1,000 mcg total) by mouth daily. 100 tablet 3   No current facility-administered medications for this visit.     Allergies: Patient has no known allergies.  Past Medical History:  Diagnosis Date  . Constipation   . Edema   . Headache(784.0)   . Morbid obesity ([place])     Past Surgical History:  Procedure Laterality Date  . BREAST REDUCTION SURGERY    . BREAST SURGERY  2005   reduction  . CESAREAN SECTION N/A 03/20/2016   Procedure: CESAREAN SECTION;  Surgeon: Christophe Louis, MD;  Location: [place] [facility];  Service: Obstetrics;  Laterality: N/A;  . TUBAL LIGATION Bilateral 03/20/2016   Procedure: BILATERAL TUBAL LIGATION;  Surgeon: Christophe Louis, MD;  Location: [place];  Service: Obstetrics;  Laterality: Bilateral;    Family History  Problem Relation Age of Onset  . Deep vein thrombosis Father   . Diabetes Father   . Hypertension Mother   . Hypertension Other   . Diabetes Paternal Aunt   . Heart disease Maternal Grandmother   . Heart disease Maternal Grandfather   . Diabetes Paternal Grandmother     Social History   Tobacco Use  . Smoking status: Never Smoker  . Smokeless tobacco: Never Used  Substance Use Topics  . Alcohol use: Yes    Comment: occasionally    Subjective:    I connected with Bridget [name] on 09/04/18 at  8:40 AM EDT by a  video enabled telemedicine application and verified that I am speaking with the correct person using two identifiers. Patient and I are the only 2 people on the video call.    I discussed the limitations of evaluation and management by telemedicine and the availability of in person appointments. The patient expressed understanding and agreed to proceed.  Complaining of chest congestion x 2-3 days; felt that symptoms worsened last night; used heating pad last night and notes that she is feeling better. Used Mucinex last night and notes that has helped as well;  On Xarelto due to  history of PE- has been taking regularly; not taking Symbicort regularly; does have Zyrtec but not taking regularly.  Admits she is very anxious about her health since recent diagnosis of PE- becomes tearful on the video call talking about how stressful the past few months have been for her. Denies any fever, shortness of breath, body aches, loss of sense of taste or smell.     Objective:  There were no vitals filed for this visit.  General: Well developed, well nourished, in no acute distress  Skin : Warm and dry.  Head: Normocephalic and atraumatic  Lungs: Respirations unlabored;  Neurologic: Alert and oriented; speech intact; face symmetrical; moves all extremities well; CNII-XII intact without focal deficit   Assessment:  1. Chest congestion     Plan:  Reassurance- suspect combination of asthma and allergy; encouraged to use her Symbicort and Zyrtec regularly; suspect anxiety component due to recent health issues; do not feel COVID testing warranted, do not feel she needs antibiotic at this time; increase fluids, rest and follow-up worse, no better.   No follow-ups on file.  No orders of the defined types were placed in this encounter.   Requested Prescriptions    No prescriptions requested or ordered in this encounter

## 2018-09-04 NOTE — Telephone Encounter (Signed)
Has she picked up the prescription for 20?  It was her first opioid prescription.  Do we need to send a prescription for 20 to new pharmacy or she can get a higher quantity now?  Thanks

## 2018-09-04 NOTE — Telephone Encounter (Signed)
Copied from [place] [phone]. Topic: General - Other >> Sep 04, 2018  4:15 PM Reyne Dumas L wrote: Reason for CRM:   Pt calling to report her lungs hurt and she is having pain and pressure in the center of her chest.  Tried Nurse Triage and because of long hold time was advised by Nurse, Cristal Generous to tell pt to seek treatment at the ED at this time. Pt states that she understood.

## 2018-09-05 ENCOUNTER — Ambulatory Visit (INDEPENDENT_AMBULATORY_CARE_PROVIDER_SITE_OTHER)
Admission: RE | Admit: 2018-09-05 | Discharge: 2018-09-05 | Disposition: A | Discharge status: Home / Self Care | Payer: 59 | Source: Ambulatory Visit

## 2018-09-05 DIAGNOSIS — J301 Allergic rhinitis due to pollen: Secondary | ICD-10-CM | POA: Diagnosis not present

## 2018-09-05 NOTE — ED Provider Notes (Signed)
Virtual Visit via Video Note:  TATYANNA CRONK  initiated request for Telemedicine visit with [facility]gent [facility] team. I connected with [name]  on 09/06/2018 at 8:14 PM  for a synchronized telemedicine visit using a video enabled HIPPA compliant telemedicine application. I verified that I am speaking with [name]  using two identifiers. [place] Hall-Potvin, PA-C  was physically located in a [place] Urgent care site and KERSTON LANDECK was located at a different location.   The limitations of evaluation and management by telemedicine as well as the availability of in-person appointments were discussed. Patient was informed that she  may incur a bill ( including co-pay) for this virtual visit encounter. [name]  expressed understanding and gave verbal consent to proceed with virtual visit.     History of Present Illness:Aubrie Jerilynn Mages Stump  is a 43 y.o. female with history of allergies presents with acute concern of nasal congestion.  Patient states that symptoms have been going on for the 5 to 7 days and is requesting antibiotic.  Patient states that she has used Flonase "here and there "as well as Mucinex with minimal relief of symptoms.  Patient endorsing nasal congestion, ear fullness and popping.  Patient denies fever, facial pain, eye pain, rhinorrhea, postnasal drip, ear pain, decreased hearing, tinnitus, sore throat, nausea, vomiting, mild pain.  Past Medical History:  Diagnosis Date  . Constipation   . Edema   . Headache(784.0)   . Morbid obesity ([place])     No Known Allergies      Observations/Objective: Pleasant 43 year old female who is well-developed and well-nourished sitting in no acute distress.    Assessment and Plan: 43 year old female with history of allergies presenting for 1 week of nasal congestion.  Likely allergic rhinitis with possible eustachian tube dysfunction second to turbinate swelling.  Due to duration, lack of compliance with  OTC medications, and history will increase frequency of Flonase, take Zyrtec daily, increase water intake.  Follow Up Instructions: Discussed limitations of thorough assessment in virtual visits.  Patient verbalized understanding will follow-up in office should symptoms not improve/worsen over the next 3 days.  I discussed the assessment and treatment plan with the patient. The patient was provided an opportunity to ask questions and all were answered. The patient agreed with the plan and demonstrated an understanding of the instructions.   The patient was advised to call back or seek an in-person evaluation if the symptoms worsen or if the condition fails to improve as anticipated.  I provided 25 minutes of non-face-to-face time during this encounter.    Burns, PA-C  09/06/2018 8:14 PM        Hall-Potvin, [place], [place] 09/06/18 2014

## 2018-09-06 NOTE — Discharge Instructions (Addendum)
Use Flonase daily in each nostril. Continue Zyrtec daily. Important to drink plenty of water throughout the day. You need to be evaluated in clinic should your symptoms not improve or worsen over the next 3 days, or you develop ear pain, worsening facial pain, fever.

## 2018-09-07 ENCOUNTER — Telehealth: Payer: Self-pay | Admitting: Internal Medicine

## 2018-09-07 MED ORDER — TRAMADOL HCL 50 MG PO TABS: 50.0000 mg | ORAL_TABLET | Freq: Four times a day (QID) | ORAL | 0 refills | Status: AC | PRN

## 2018-09-07 NOTE — Telephone Encounter (Signed)
Pt went to ED on 09/05/18

## 2018-09-07 NOTE — Telephone Encounter (Signed)
Patient calling in checking on status of medications. States she never picked up her [facility]adol [facility]. States she just wants it transferred over to [facility] on [address]. Patient would also like to make PCP aware that the [facility] paperwork has been sent in through [employer]. Leave started on June 8th.

## 2018-09-07 NOTE — Telephone Encounter (Signed)
Forms have been completed & placed in providers box to review and sign.  °

## 2018-09-07 NOTE — Telephone Encounter (Addendum)
Ok.  I will send a tramadol prescription to [facility] on [address]. Please print her FMLA paperwork and give to [place]. Thank you

## 2018-09-07 NOTE — Telephone Encounter (Signed)
See message below °

## 2018-09-08 ENCOUNTER — Telehealth: Payer: Self-pay | Admitting: Internal Medicine

## 2018-09-08 NOTE — Telephone Encounter (Signed)
[facility] is calling and stated they received a prescription for Tramadol for pt but it also show that pt just picked up a prescription for same medication at [facility] that was dated 5.29.20. Please advise if this need to be filled

## 2018-09-09 ENCOUNTER — Telehealth: Payer: Self-pay | Admitting: Internal Medicine

## 2018-09-09 DIAGNOSIS — Z0279 Encounter for issue of other medical certificate: Secondary | ICD-10-CM

## 2018-09-09 NOTE — Telephone Encounter (Signed)
Pt states she is doing everything Bridget [name] told her but she is not feeling any better, She is still having the chest congestion, cough, sore throat, and some chest pain due to congestion and cough. She would like to know if you can send her in anything to help or does she need another appointment.

## 2018-09-09 NOTE — Telephone Encounter (Signed)
Forms have been completed, Faxed, Copy sent to scan &charged for.   Patient informed and original has been mailed to patient.

## 2018-09-09 NOTE — Telephone Encounter (Signed)
Pharmacy notified to hold RX

## 2018-09-09 NOTE — Telephone Encounter (Signed)
Patient is requesting a call back from [facility], about seeing Dr. Alain Marion. He does not have any appointments until next week and she only wants to see him. She states she has already seen NP and been the urgent care.  Please follow up with patient.

## 2018-09-10 NOTE — Telephone Encounter (Signed)
Pt is calling back to follow up on this. Pleas call back  Pt says she has been waiting for 2 days for a call back

## 2018-09-10 NOTE — Telephone Encounter (Signed)
Patient checking on the status of message below and would like PCP to send antibiotics to   [facility] [place] [phone] (Phone) [phone] (Fax)

## 2018-09-11 MED ORDER — AMOXICILLIN-POT CLAVULANATE 875-125 MG PO TABS: 1.0000 | ORAL_TABLET | Freq: Two times a day (BID) | ORAL | 0 refills | Status: DC

## 2018-09-11 NOTE — Telephone Encounter (Signed)
I will send in antibiotics for her. I want her to come see Dr. Jamse Belfast in the next 1-2 weeks for follow-up please.

## 2018-09-11 NOTE — Telephone Encounter (Signed)
Appointment scheduled.

## 2018-09-16 ENCOUNTER — Ambulatory Visit (INDEPENDENT_AMBULATORY_CARE_PROVIDER_SITE_OTHER)
Admission: RE | Admit: 2018-09-16 | Discharge: 2018-09-16 | Disposition: A | Discharge status: Home / Self Care | Payer: 59 | Source: Ambulatory Visit | Attending: Internal Medicine | Admitting: Internal Medicine

## 2018-09-16 ENCOUNTER — Encounter: Payer: Self-pay | Admitting: Internal Medicine

## 2018-09-16 ENCOUNTER — Ambulatory Visit (INDEPENDENT_AMBULATORY_CARE_PROVIDER_SITE_OTHER): Payer: 59 | Admitting: Internal Medicine

## 2018-09-16 ENCOUNTER — Other Ambulatory Visit: Payer: Self-pay

## 2018-09-16 ENCOUNTER — Ambulatory Visit: Payer: 59 | Admitting: Internal Medicine

## 2018-09-16 VITALS — BP 124/78 | HR 81 | Temp 98.0°F | Ht 64.0 in | Wt 315.0 lb

## 2018-09-16 DIAGNOSIS — F419 Anxiety disorder, unspecified: Secondary | ICD-10-CM | POA: Diagnosis not present

## 2018-09-16 DIAGNOSIS — F329 Major depressive disorder, single episode, unspecified: Secondary | ICD-10-CM | POA: Diagnosis not present

## 2018-09-16 DIAGNOSIS — R0789 Other chest pain: Secondary | ICD-10-CM

## 2018-09-16 DIAGNOSIS — I2699 Other pulmonary embolism without acute cor pulmonale: Secondary | ICD-10-CM

## 2018-09-16 DIAGNOSIS — E538 Deficiency of other specified B group vitamins: Secondary | ICD-10-CM

## 2018-09-16 DIAGNOSIS — R0609 Other forms of dyspnea: Secondary | ICD-10-CM

## 2018-09-16 MED ORDER — PANTOPRAZOLE SODIUM 40 MG PO TBEC: 40.0000 mg | DELAYED_RELEASE_TABLET | Freq: Every day | ORAL | 3 refills | Status: DC

## 2018-09-16 MED ORDER — ESCITALOPRAM OXALATE 10 MG PO TABS: 10.0000 mg | ORAL_TABLET | Freq: Every day | ORAL | 5 refills | Status: DC

## 2018-09-16 NOTE — Assessment & Plan Note (Signed)
On Xarelto 

## 2018-09-16 NOTE — Assessment & Plan Note (Addendum)
We can increase Lexapro dose to 10 mg/d Better

## 2018-09-16 NOTE — Assessment & Plan Note (Signed)
We can increase Lexapro dose to 10 mg/d Better

## 2018-09-16 NOTE — Assessment & Plan Note (Addendum)
C/o chest congestion, pain - Mucinex, MDI helps Empiric Pepcid CXR

## 2018-09-16 NOTE — Progress Notes (Signed)
Subjective:  Patient ID: Bridget [name], female    DOB: 04-03-1975  Age: 43 y.o. MRN: [id number]  CC: No chief complaint on file.   HPI VIETTA BONIFIELD presents for PE, anticoagulation, depression/anxiety C/o chest congestion, pain - Mucinex, MDI helps  Outpatient Medications Prior to Visit  Medication Sig Dispense Refill  . amoxicillin-clavulanate (AUGMENTIN) 875-125 MG tablet Take 1 tablet by mouth 2 (two) times daily. 20 tablet 0  . budesonide-formoterol (SYMBICORT) 160-4.5 MCG/ACT inhaler Inhale 2 puffs into the lungs 2 (two) times daily. 3 Inhaler 3  . cetirizine (ZYRTEC) 10 MG tablet Take 10 mg by mouth daily as needed for allergies.    . Cholecalciferol (VITAMIN D3) 1.25 MG (50000 UT) CAPS Take 1 capsule by mouth once a week. 8 capsule 0  . escitalopram (LEXAPRO) 5 MG tablet Take 1 tablet (5 mg total) by mouth daily. 30 tablet 5  . Multiple Vitamin (MULTIVITAMIN) capsule Take 1 capsule by mouth daily.    . Potassium Gluconate 550 MG TABS Take 1 tablet by mouth daily.    . rivaroxaban (XARELTO) 20 MG TABS tablet Take 1 tablet (20 mg total) by mouth daily with supper. 30 tablet 5  . triamterene-hydrochlorothiazide (MAXZIDE-25) 37.5-25 MG tablet Take 1 tablet by mouth daily. Overdue for annual appt must see MD for future refills 90 tablet 3  . vitamin B-12 (CYANOCOBALAMIN) 1000 MCG tablet Take 1 tablet (1,000 mcg total) by mouth daily. 100 tablet 3   No facility-administered medications prior to visit.     ROS: Review of Systems  Constitutional: Negative for activity change, appetite change, chills, fatigue and unexpected weight change.  HENT: Negative for congestion, mouth sores and sinus pressure.   Eyes: Negative for visual disturbance.  Respiratory: Negative for cough and chest tightness.   Cardiovascular: Positive for chest pain.  Gastrointestinal: Negative for abdominal pain and nausea.  Genitourinary: Negative for difficulty urinating, frequency and vaginal pain.   Musculoskeletal: Negative for back pain and gait problem.  Skin: Negative for pallor and rash.  Neurological: Negative for dizziness, tremors, weakness, numbness and headaches.  Psychiatric/Behavioral: Negative for confusion, sleep disturbance and suicidal ideas.    Objective:  BP 124/78 (BP Location: Left Arm, Patient Position: Sitting, Cuff Size: Large)   Pulse 81   Temp 98 F (36.7 C) (Oral)   Ht 5\' 4"  (1.626 m)   Wt (!) 315 lb (142.9 kg)   SpO2 97%   BMI 54.07 kg/m   BP Readings from Last 3 Encounters:  09/16/18 124/78  08/19/18 128/72  08/05/18 130/80    Wt Readings from Last 3 Encounters:  09/16/18 (!) 315 lb (142.9 kg)  08/19/18 (!) 322 lb (146.1 kg)  08/05/18 (!) 322 lb 6.4 oz (146.2 kg)    Physical Exam Constitutional:      General: She is not in acute distress.    Appearance: She is well-developed.  HENT:     Head: Normocephalic.     Right Ear: External ear normal.     Left Ear: External ear normal.     Nose: Nose normal.  Eyes:     General:        Right eye: No discharge.        Left eye: No discharge.     Conjunctiva/sclera: Conjunctivae normal.     Pupils: Pupils are equal, round, and reactive to light.  Neck:     Musculoskeletal: Normal range of motion and neck supple.     Thyroid: No thyromegaly.  Vascular: No JVD.     Trachea: No tracheal deviation.  Cardiovascular:     Rate and Rhythm: Normal rate and regular rhythm.     Heart sounds: Normal heart sounds.  Pulmonary:     Effort: No respiratory distress.     Breath sounds: No stridor. No wheezing.  Abdominal:     General: Bowel sounds are normal. There is no distension.     Palpations: Abdomen is soft. There is no mass.     Tenderness: There is no abdominal tenderness. There is no guarding or rebound.  Musculoskeletal:        General: No tenderness.  Lymphadenopathy:     Cervical: No cervical adenopathy.  Skin:    Findings: No erythema or rash.  Neurological:     Cranial Nerves: No  cranial nerve deficit.     Motor: No abnormal muscle tone.     Coordination: Coordination normal.     Deep Tendon Reflexes: Reflexes normal.  Psychiatric:        Behavior: Behavior normal.        Thought Content: Thought content normal.        Judgment: Judgment normal.   obese Anxious  Lab Results  Component Value Date   WBC 8.1 06/30/2018   HGB 14.1 06/30/2018   HCT 41.4 06/30/2018   PLT 353.0 06/30/2018   GLUCOSE 102 (H) 07/13/2018   CHOL 153 04/21/2018   TRIG 88.0 04/21/2018   HDL 39.40 04/21/2018   LDLCALC 96 04/21/2018   ALT 8 07/13/2018   AST 12 07/13/2018   NA 140 07/13/2018   K 3.3 (L) 07/13/2018   CL 102 07/13/2018   CREATININE 0.75 07/13/2018   BUN 12 07/13/2018   CO2 26 07/13/2018   TSH 0.61 04/21/2018   HGBA1C 5.3 10/29/2017    No results found.  Assessment & Plan:     Walker Kehr, MD

## 2018-09-16 NOTE — Assessment & Plan Note (Signed)
On B12 

## 2018-09-16 NOTE — Assessment & Plan Note (Signed)
C/o chest congestion, pain - Mucinex, MDI helps

## 2018-09-21 ENCOUNTER — Ambulatory Visit (INDEPENDENT_AMBULATORY_CARE_PROVIDER_SITE_OTHER): Payer: 59 | Admitting: Psychology

## 2018-09-21 DIAGNOSIS — F4323 Adjustment disorder with mixed anxiety and depressed mood: Secondary | ICD-10-CM | POA: Diagnosis not present

## 2018-09-22 ENCOUNTER — Inpatient Hospital Stay: Payer: 59 | Attending: Hematology

## 2018-09-22 ENCOUNTER — Inpatient Hospital Stay: Payer: 59 | Admitting: Hematology

## 2018-10-12 ENCOUNTER — Ambulatory Visit (INDEPENDENT_AMBULATORY_CARE_PROVIDER_SITE_OTHER): Payer: 59 | Admitting: Psychology

## 2018-10-12 DIAGNOSIS — F4323 Adjustment disorder with mixed anxiety and depressed mood: Secondary | ICD-10-CM | POA: Diagnosis not present

## 2018-10-16 ENCOUNTER — Telehealth: Payer: Self-pay | Admitting: Internal Medicine

## 2018-10-16 NOTE — Telephone Encounter (Signed)
Medication Refill - Medication:  amoxicillin-clavulanate (AUGMENTIN) 875-125 MG tablet Z-pack  Has the patient contacted their pharmacy? Yes advised to call. Please call patient back as she is wanting this done ASAP.  Preferred Pharmacy (with phone number or street name):  [place], [place] [phone] (Phone) [phone] (Fax)   Agent: Please be advised that RX refills may take up to 3 business days. We ask that you follow-up with your pharmacy.

## 2018-10-17 ENCOUNTER — Ambulatory Visit (INDEPENDENT_AMBULATORY_CARE_PROVIDER_SITE_OTHER)
Admission: RE | Admit: 2018-10-17 | Discharge: 2018-10-17 | Disposition: A | Discharge status: Home / Self Care | Payer: 59 | Source: Ambulatory Visit

## 2018-10-17 DIAGNOSIS — Z86711 Personal history of pulmonary embolism: Secondary | ICD-10-CM

## 2018-10-17 DIAGNOSIS — R5383 Other fatigue: Secondary | ICD-10-CM

## 2018-10-17 DIAGNOSIS — R0989 Other specified symptoms and signs involving the circulatory and respiratory systems: Secondary | ICD-10-CM

## 2018-10-17 DIAGNOSIS — R0981 Nasal congestion: Secondary | ICD-10-CM

## 2018-10-17 MED ORDER — ALBUTEROL SULFATE HFA 108 (90 BASE) MCG/ACT IN AERS: 1.0000 | INHALATION_SPRAY | Freq: Four times a day (QID) | RESPIRATORY_TRACT | 0 refills | Status: DC | PRN

## 2018-10-17 NOTE — ED Provider Notes (Signed)
Virtual Visit via Video Note:  Bridget [name]  initiated request for Telemedicine visit with [facility]gent [facility] team. I connected with Bridget [name]  on 10/17/2018 at 11:03 AM  for a synchronized telemedicine visit using a video enabled HIPPA compliant telemedicine application. I verified that I am speaking with Bridget [name]  using two identifiers. Jaynee Eagles, PA-C  was physically located in a [facility] Urgent care site and LAUREE YURICK was located at a different location.   The limitations of evaluation and management by telemedicine as well as the availability of in-person appointments were discussed. Patient was informed that she  may incur a bill ( including co-pay) for this virtual visit encounter. Bridget [name]  expressed understanding and gave verbal consent to proceed with virtual visit.   History of Present Illness:Bridget [name]  is a 43 y.o. female presents with 2 week history of recurrent and persistent chest congestion that elicits back pain. Sx started out with head cold.  Patient is supposed to be on Symbicort but is not consistent, takes it prn.  Denies history of asthma, COPD. Has been taking Mucinex with minimal relief.  Patient took Augmentin 09/11/2018 prescribed for "cold" and is worried that she needs another round of antibiotics for pneumonia. Of note, she has had PE, April 2020. Has been taking Xarelto and has had a difficult time recovering. Has 2 kids, is very weary of having COVID 47, both for herself and her kids. Had COVID 19 testing IgG 07/13/2018, was negative.    Review of Systems  Constitutional: Positive for malaise/fatigue. Negative for fever.  HENT: Positive for congestion. Negative for ear pain, sinus pain and sore throat.        Clogged ears.  Eyes: Negative for blurred vision, double vision, discharge and redness.  Respiratory: Positive for shortness of breath. Negative for cough, hemoptysis and wheezing.   Cardiovascular: Positive for  chest pain (mild-moderate discomfort).  Gastrointestinal: Negative for abdominal pain, diarrhea, nausea and vomiting.  Genitourinary: Negative for dysuria, flank pain and hematuria.  Musculoskeletal: Positive for back pain. Negative for myalgias.  Skin: Negative for rash.  Neurological: Negative for dizziness, weakness and headaches.  Psychiatric/Behavioral: Negative for depression and substance abuse.    No current facility-administered medications for this encounter.    Current Outpatient Medications  Medication Sig Dispense Refill  . amoxicillin-clavulanate (AUGMENTIN) 875-125 MG tablet Take 1 tablet by mouth 2 (two) times daily. 20 tablet 0  . budesonide-formoterol (SYMBICORT) 160-4.5 MCG/ACT inhaler Inhale 2 puffs into the lungs 2 (two) times daily. 3 Inhaler 3  . cetirizine (ZYRTEC) 10 MG tablet Take 10 mg by mouth daily as needed for allergies.    . Cholecalciferol (VITAMIN D3) 1.25 MG (50000 UT) CAPS Take 1 capsule by mouth once a week. 8 capsule 0  . escitalopram (LEXAPRO) 10 MG tablet Take 1 tablet (10 mg total) by mouth daily. 30 tablet 5  . Multiple Vitamin (MULTIVITAMIN) capsule Take 1 capsule by mouth daily.    . pantoprazole (PROTONIX) 40 MG tablet Take 1 tablet (40 mg total) by mouth daily. 30 tablet 3  . Potassium Gluconate 550 MG TABS Take 1 tablet by mouth daily.    . rivaroxaban (XARELTO) 20 MG TABS tablet Take 1 tablet (20 mg total) by mouth daily with supper. 30 tablet 5  . triamterene-hydrochlorothiazide (MAXZIDE-25) 37.5-25 MG tablet Take 1 tablet by mouth daily. Overdue for annual appt must see MD for future refills 90 tablet 3  .  vitamin B-12 (CYANOCOBALAMIN) 1000 MCG tablet Take 1 tablet (1,000 mcg total) by mouth daily. 100 tablet 3     No Known Allergies    Past Medical History:  Diagnosis Date  . Constipation   . Edema   . Headache(784.0)   . Morbid obesity ([place])     Past Surgical History:  Procedure Laterality Date  . BREAST REDUCTION SURGERY     . BREAST SURGERY  2005   reduction  . CESAREAN SECTION N/A 03/20/2016   Procedure: CESAREAN SECTION;  Surgeon: Christophe Louis, MD;  Location: [place];  Service: Obstetrics;  Laterality: N/A;  . TUBAL LIGATION Bilateral 03/20/2016   Procedure: BILATERAL TUBAL LIGATION;  Surgeon: Christophe Louis, MD;  Location: [place];  Service: Obstetrics;  Laterality: Bilateral;    Observations/Objective: Physical Exam   Assessment and Plan:  1. Chest congestion   2. Other fatigue   3. History of pulmonary embolism   4. Sinus congestion     Patient needs in person evaluation, chest x-ray. Counseled against another round of antibiotics without appropriate medical data to support this. Will use albuterol inhaler in the meantime for supportive care. She denies coughing and therefore will hold off on cough medications. Patient states that she will try to make an appt for tomorrow at [facility]. ER precautions reviewed.   Follow Up Instructions:    I discussed the assessment and treatment plan with the patient. The patient was provided an opportunity to ask questions and all were answered. The patient agreed with the plan and demonstrated an understanding of the instructions.   The patient was advised to call back or seek an in-person evaluation if the symptoms worsen or if the condition fails to improve as anticipated.  I provided 15 minutes of non-face-to-face time during this encounter.    Jaynee Eagles, PA-C  10/17/2018 11:03 AM       Jaynee Eagles, PA-C 10/17/18 1124

## 2018-10-18 ENCOUNTER — Other Ambulatory Visit: Payer: Self-pay

## 2018-10-18 ENCOUNTER — Ambulatory Visit (INDEPENDENT_AMBULATORY_CARE_PROVIDER_SITE_OTHER): Payer: 59

## 2018-10-18 ENCOUNTER — Encounter (HOSPITAL_COMMUNITY): Payer: Self-pay | Admitting: Emergency Medicine

## 2018-10-18 ENCOUNTER — Ambulatory Visit (HOSPITAL_COMMUNITY)
Admission: EM | Admit: 2018-10-18 | Discharge: 2018-10-18 | Disposition: A | Discharge status: Home / Self Care | Payer: 59 | Attending: Urgent Care | Admitting: Urgent Care

## 2018-10-18 DIAGNOSIS — R0989 Other specified symptoms and signs involving the circulatory and respiratory systems: Secondary | ICD-10-CM

## 2018-10-18 DIAGNOSIS — R7989 Other specified abnormal findings of blood chemistry: Secondary | ICD-10-CM

## 2018-10-18 DIAGNOSIS — R0789 Other chest pain: Secondary | ICD-10-CM

## 2018-10-18 DIAGNOSIS — R5383 Other fatigue: Secondary | ICD-10-CM

## 2018-10-18 DIAGNOSIS — M549 Dorsalgia, unspecified: Secondary | ICD-10-CM | POA: Diagnosis not present

## 2018-10-18 DIAGNOSIS — M4120 Other idiopathic scoliosis, site unspecified: Secondary | ICD-10-CM

## 2018-10-18 DIAGNOSIS — R5381 Other malaise: Secondary | ICD-10-CM

## 2018-10-18 HISTORY — DX: Other pulmonary embolism without acute cor pulmonale: I26.99

## 2018-10-18 MED ORDER — TRAMADOL-ACETAMINOPHEN 37.5-325 MG PO TABS: 1.0000 | ORAL_TABLET | Freq: Three times a day (TID) | ORAL | 0 refills | Status: AC | PRN

## 2018-10-18 NOTE — ED Provider Notes (Signed)
MRN: [id number] DOB: 1975/05/02  Subjective:   Bridget [name] is a 43 y.o. female presenting for presenting for follow-up and chest x-ray given our video visit yesterday.  She is concerned for pneumonia, has had several week history of recurrent and persistent moderate chest congestion, discomfort and tightness about either side of her chest.  She is also having intermittent moderate upper back pain.  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  Importantly, she has had a negative chest CT and negative ultrasound of her lower extremities but has been maintained on Xarelto by her PCP given positive d-dimer.  She is taking her Xarelto daily.  There is a family history of DVT with her father.  Had COVID-19 testing, IgG on 07/13/2018 and was negative.  She has concerns for COVID-19 given her kids and her own personal health.  She has already undergone antibiotic course, last took Augmentin at the end of June beginning of July.  She has used Symbicort which helped initially but is not consistent with this so does not feel like it is helping much.  Since starting albuterol that I prescribed yesterday she does not report that it has helped her more.  She knows she has a history of scoliosis but has never gotten a consult for it.   No current facility-administered medications for this encounter.   Current Outpatient Medications:  .  cetirizine (ZYRTEC) 10 MG tablet, Take 10 mg by mouth daily as needed for allergies., Disp: , Rfl:  .  Cholecalciferol (VITAMIN D3) 1.25 MG (50000 UT) CAPS, Take 1 capsule by mouth once a week., Disp: 8 capsule, Rfl: 0 .  escitalopram (LEXAPRO) 10 MG tablet, Take 1 tablet (10 mg total) by mouth daily., Disp: 30 tablet, Rfl: 5 .  Multiple Vitamin (MULTIVITAMIN) capsule, Take 1 capsule by mouth daily., Disp: , Rfl:  .  pantoprazole (PROTONIX) 40 MG tablet, Take 1 tablet (40 mg total) by mouth daily., Disp: 30 tablet, Rfl: 3 .   Potassium Gluconate 550 MG TABS, Take 1 tablet by mouth daily., Disp: , Rfl:  .  rivaroxaban (XARELTO) 20 MG TABS tablet, Take 1 tablet (20 mg total) by mouth daily with supper., Disp: 30 tablet, Rfl: 5 .  triamterene-hydrochlorothiazide (MAXZIDE-25) 37.5-25 MG tablet, Take 1 tablet by mouth daily. Overdue for annual appt must see MD for future refills, Disp: 90 tablet, Rfl: 3 .  vitamin B-12 (CYANOCOBALAMIN) 1000 MCG tablet, Take 1 tablet (1,000 mcg total) by mouth daily., Disp: 100 tablet, Rfl: 3 .  albuterol (VENTOLIN HFA) 108 (90 Base) MCG/ACT inhaler, Inhale 1-2 puffs into the lungs every 6 (six) hours as needed for wheezing or shortness of breath., Disp: 18 g, Rfl: 0 .  amoxicillin-clavulanate (AUGMENTIN) 875-125 MG tablet, Take 1 tablet by mouth 2 (two) times daily., Disp: 20 tablet, Rfl: 0 .  budesonide-formoterol (SYMBICORT) 160-4.5 MCG/ACT inhaler, Inhale 2 puffs into the lungs 2 (two) times daily., Disp: 3 Inhaler, Rfl: 3    No Known Allergies   Past Medical History:  Diagnosis Date  . Constipation   . Edema   . Headache(784.0)   . Morbid obesity ([place])   . Pulmonary embolism [facility])      Past Surgical History:  Procedure Laterality Date  . BREAST REDUCTION SURGERY    . BREAST SURGERY  2005   reduction  . CESAREAN SECTION N/A 03/20/2016   Procedure: CESAREAN SECTION;  Surgeon: Christophe Louis, MD;  Location: [facility]  BIRTHING SUITES;  Service: Obstetrics;  Laterality: N/A;  . TUBAL LIGATION Bilateral 03/20/2016   Procedure: BILATERAL TUBAL LIGATION;  Surgeon: Christophe Louis, MD;  Location: [employer];  Service: Obstetrics;  Laterality: Bilateral;    Family History  Problem Relation Age of Onset  . Deep vein thrombosis Father   . Diabetes Father   . Hypertension Mother   . Hypertension Other   . Diabetes Paternal Aunt   . Heart disease Maternal Grandmother   . Heart disease Maternal Grandfather   . Diabetes Paternal Grandmother     ROS  Objective:   Vitals: BP (!) 142/98  (BP Location: Right Arm) Comment (BP Location): regular, forearm  Pulse 93   Temp 98.3 F (36.8 C) (Oral)   Resp 20   LMP 09/17/2018   SpO2 95%    Physical Exam Constitutional:      General: She is not in acute distress.    Appearance: Normal appearance. She is well-developed. She is obese. She is not ill-appearing, toxic-appearing or diaphoretic.  HENT:     Head: Normocephalic and atraumatic.     Right Ear: Tympanic membrane, ear canal and external ear normal. No drainage or tenderness. No middle ear effusion. Tympanic membrane is not erythematous.     Left Ear: Tympanic membrane, ear canal and external ear normal. No drainage or tenderness.  No middle ear effusion. Tympanic membrane is not erythematous.     Nose: Nose normal. No congestion or rhinorrhea.     Mouth/Throat:     Mouth: Mucous membranes are moist. No oral lesions.     Pharynx: Oropharynx is clear. No pharyngeal swelling, oropharyngeal exudate, posterior oropharyngeal erythema or uvula swelling.     Tonsils: No tonsillar exudate or tonsillar abscesses.  Eyes:     General: No scleral icterus.    Extraocular Movements: Extraocular movements intact.     Right eye: Normal extraocular motion.     Left eye: Normal extraocular motion.     Conjunctiva/sclera: Conjunctivae normal.     Pupils: Pupils are equal, round, and reactive to light.  Neck:     Musculoskeletal: Normal range of motion and neck supple.  Cardiovascular:     Rate and Rhythm: Normal rate and regular rhythm.     Pulses: Normal pulses.     Heart sounds: Normal heart sounds. No murmur. No friction rub. No gallop.   Pulmonary:     Effort: Pulmonary effort is normal. No respiratory distress.     Breath sounds: Normal breath sounds. No stridor. No wheezing, rhonchi or rales.  Abdominal:     General: Bowel sounds are normal. There is no distension.     Palpations: Abdomen is soft. There is no mass.     Tenderness: There is no abdominal tenderness. There is no  right CVA tenderness, left CVA tenderness, guarding or rebound.  Musculoskeletal:        General: No swelling.     Right lower leg: No edema.     Left lower leg: No edema.  Lymphadenopathy:     Cervical: No cervical adenopathy.  Skin:    General: Skin is warm and dry.     Coloration: Skin is not pale.     Findings: No rash.  Neurological:     General: No focal deficit present.     Mental Status: She is alert and oriented to person, place, and time.     Cranial Nerves: No cranial nerve deficit.  Psychiatric:  Mood and Affect: Mood normal.        Behavior: Behavior normal.        Thought Content: Thought content normal.        Judgment: Judgment normal.     Dg Chest 2 View  Result Date: 10/18/2018 CLINICAL DATA:  Cold and cough symptoms for 2 weeks. EXAM: CHEST - 2 VIEW COMPARISON:  09/16/2018 FINDINGS: The cardiac silhouette, mediastinal and hilar contours are within normal limits and stable. The lungs are clear. No pleural effusions. No worrisome pulmonary lesions. The bony thorax is intact. Stable severe scoliosis. IMPRESSION: No acute cardiopulmonary findings. Electronically Signed   By: Marijo Sanes M.D.   On: 10/18/2018 13:20    Assessment and Plan :   1. Chest congestion   2. Chest discomfort   3. Upper back pain   4. Malaise and fatigue   5. Positive D dimer   6. Other idiopathic scoliosis, unspecified spinal region   7. Morbid obesity ([place])     Had extensive discussion with patient about possible etiologies for her malaise and several month history of illness.  Counseled that she needs to follow-up with her PCP, possible referral to pulmonologist and recommend that she contact [place] [facility] for consult on her scoliosis.  Recommended she hold off on using Symbicort and switch to albuterol as needed.  Due to possibility of embolism will hold off on using naproxen but will have her use Ultracet to address her back pain and discomfort. Counseled patient on  potential for adverse effects with medications prescribed/recommended today, ER and return-to-clinic precautions discussed, patient verbalized understanding.    Jaynee Eagles, PA-C 10/18/18 1422

## 2018-10-18 NOTE — ED Triage Notes (Signed)
Bridget [name] for 2 weeks.  Patient concerned for pneumonia Denies fever Denies cough  Patient reports having chest congestion and pain in upper back.  Patient has had a PE in April 2020

## 2018-10-18 NOTE — Telephone Encounter (Signed)
Denied.  We do not refill antibiotics routinely.  Thanks

## 2018-10-18 NOTE — Discharge Instructions (Addendum)
I sincerely suspect that your upper back pain is related more to your scoliosis as opposed to a chest PE.  Please make sure you maintain follow-up with your PCP for continued management of your Xarelto.  In the meantime I would like for you to reach out to [place] [facility] for consult on your scoliosis.  You may continue to use albuterol inhaler that I prescribed in case you feel chest tightness or shortness of breath.  I recommend stopping Symbicort until your recheck with your PCP.  For any kind of back pain that you have you can use Tylenol 500 mg every 6 hours as needed.  If this does not help you with your back pain then you can use a stronger narcotic medicine called Ultracet once every 8 hours.  Do not take this medicine if your back is not hurting.   For elevated blood pressure, make sure you are monitoring salt in your diet.  Do not eat restaurant foods and limit processed foods at home.  Processed foods include things like frozen meals preseason meats and dinners.  Make sure your pain attention to sodium labels on foods you by at the grocery store.  For seasoning you can use a brand called Mrs. Dash which includes a lot of salt free seasonings.  Salads - kale, spinach, cabbage, spring mix; use seeds like pumpkin seeds or sunflower seeds, almonds; you can also use 1-2 hard boiled eggs in your salads Fruits - avocadoes, berries (blueberries, raspberries, blackberries), apples, oranges, pomegranate, grapefruit Vegetables - aspargus, cauliflower, broccoli, green beans, brussel spouts, bell peppers; stay away from starchy vegetables like potatoes, carrots, peas  Regarding meat it is better to eat lean meats and limit your red meat consumption including pork.  Wild caught fish, chicken breast are good options.  Do not eat any foods on this list that you are allergic to.

## 2018-11-02 ENCOUNTER — Ambulatory Visit (INDEPENDENT_AMBULATORY_CARE_PROVIDER_SITE_OTHER): Payer: 59 | Admitting: Psychology

## 2018-11-02 DIAGNOSIS — F4323 Adjustment disorder with mixed anxiety and depressed mood: Secondary | ICD-10-CM

## 2018-11-16 ENCOUNTER — Other Ambulatory Visit: Payer: Self-pay

## 2018-11-16 ENCOUNTER — Ambulatory Visit (INDEPENDENT_AMBULATORY_CARE_PROVIDER_SITE_OTHER): Payer: 59 | Admitting: Internal Medicine

## 2018-11-16 ENCOUNTER — Encounter: Payer: Self-pay | Admitting: Internal Medicine

## 2018-11-16 ENCOUNTER — Ambulatory Visit: Payer: 59 | Admitting: Internal Medicine

## 2018-11-16 VITALS — BP 128/80 | HR 93 | Temp 98.5°F | Ht 64.0 in | Wt 319.0 lb

## 2018-11-16 DIAGNOSIS — Z23 Encounter for immunization: Secondary | ICD-10-CM

## 2018-11-16 DIAGNOSIS — F419 Anxiety disorder, unspecified: Secondary | ICD-10-CM | POA: Diagnosis not present

## 2018-11-16 DIAGNOSIS — E538 Deficiency of other specified B group vitamins: Secondary | ICD-10-CM

## 2018-11-16 DIAGNOSIS — E559 Vitamin D deficiency, unspecified: Secondary | ICD-10-CM

## 2018-11-16 MED ORDER — RIVAROXABAN 20 MG PO TABS: 20.0000 mg | ORAL_TABLET | Freq: Every day | ORAL | 5 refills | Status: DC

## 2018-11-16 MED ORDER — ALBUTEROL SULFATE HFA 108 (90 BASE) MCG/ACT IN AERS: 1.0000 | INHALATION_SPRAY | Freq: Four times a day (QID) | RESPIRATORY_TRACT | 5 refills | Status: DC | PRN

## 2018-11-16 MED ORDER — ESCITALOPRAM OXALATE 20 MG PO TABS: 20.0000 mg | ORAL_TABLET | Freq: Every day | ORAL | 1 refills | Status: DC

## 2018-11-16 MED ORDER — BUSPIRONE HCL 10 MG PO TABS: 10.0000 mg | ORAL_TABLET | Freq: Two times a day (BID) | ORAL | 5 refills | Status: DC

## 2018-11-16 NOTE — Assessment & Plan Note (Signed)
On B12 

## 2018-11-16 NOTE — Patient Instructions (Signed)

## 2018-11-16 NOTE — Progress Notes (Signed)
Subjective:  Patient ID: Bridget [name], female    DOB: October 13, 1975  Age: 43 y.o. MRN: [id number]  CC: No chief complaint on file.   HPI Bridget [name] presents for PE C/o ?"cold" infection; SOB episodes - not better... Resp symptoms at home - Symbicort helps C/o panic attacks and crying Outpatient Medications Prior to Visit  Medication Sig Dispense Refill  . albuterol (VENTOLIN HFA) 108 (90 Base) MCG/ACT inhaler Inhale 1-2 puffs into the lungs every 6 (six) hours as needed for wheezing or shortness of breath. 18 g 0  . budesonide-formoterol (SYMBICORT) 160-4.5 MCG/ACT inhaler Inhale 2 puffs into the lungs 2 (two) times daily. 3 Inhaler 3  . cetirizine (ZYRTEC) 10 MG tablet Take 10 mg by mouth daily as needed for allergies.    . Cholecalciferol (VITAMIN D3) 1.25 MG (50000 UT) CAPS Take 1 capsule by mouth once a week. 8 capsule 0  . escitalopram (LEXAPRO) 10 MG tablet Take 1 tablet (10 mg total) by mouth daily. 30 tablet 5  . Multiple Vitamin (MULTIVITAMIN) capsule Take 1 capsule by mouth daily.    . pantoprazole (PROTONIX) 40 MG tablet Take 1 tablet (40 mg total) by mouth daily. 30 tablet 3  . Potassium Gluconate 550 MG TABS Take 1 tablet by mouth daily.    . rivaroxaban (XARELTO) 20 MG TABS tablet Take 1 tablet (20 mg total) by mouth daily with supper. 30 tablet 5  . triamterene-hydrochlorothiazide (MAXZIDE-25) 37.5-25 MG tablet Take 1 tablet by mouth daily. Overdue for annual appt must see MD for future refills 90 tablet 3  . vitamin B-12 (CYANOCOBALAMIN) 1000 MCG tablet Take 1 tablet (1,000 mcg total) by mouth daily. 100 tablet 3   No facility-administered medications prior to visit.     ROS: Review of Systems  Constitutional: Positive for fatigue and unexpected weight change. Negative for activity change, appetite change and chills.  HENT: Negative for congestion, mouth sores and sinus pressure.   Eyes: Negative for visual disturbance.  Respiratory: Positive for shortness of  breath. Negative for cough and chest tightness.   Gastrointestinal: Negative for abdominal pain and nausea.  Genitourinary: Negative for difficulty urinating, frequency and vaginal pain.  Musculoskeletal: Negative for back pain and gait problem.  Skin: Negative for pallor and rash.  Neurological: Negative for dizziness, tremors, weakness, numbness and headaches.  Psychiatric/Behavioral: Positive for dysphoric mood and sleep disturbance. Negative for confusion and suicidal ideas. The patient is nervous/anxious.     Objective:  BP 128/80 (BP Location: Left Arm, Patient Position: Sitting, Cuff Size: Large)   Pulse 93   Temp 98.5 F (36.9 C) (Oral)   Ht 5\' 4"  (1.626 m)   Wt (!) 319 lb (144.7 kg)   SpO2 95%   BMI 54.76 kg/m   BP Readings from Last 3 Encounters:  11/16/18 128/80  10/18/18 (!) 142/98  09/16/18 124/78    Wt Readings from Last 3 Encounters:  11/16/18 (!) 319 lb (144.7 kg)  09/16/18 (!) 315 lb (142.9 kg)  08/19/18 (!) 322 lb (146.1 kg)    Physical Exam Constitutional:      General: She is not in acute distress.    Appearance: She is well-developed. She is obese.  HENT:     Head: Normocephalic.     Right Ear: External ear normal.     Left Ear: External ear normal.     Nose: Nose normal.  Eyes:     General:        Right eye: No  discharge.        Left eye: No discharge.     Conjunctiva/sclera: Conjunctivae normal.     Pupils: Pupils are equal, round, and reactive to light.  Neck:     Musculoskeletal: Normal range of motion and neck supple.     Thyroid: No thyromegaly.     Vascular: No JVD.     Trachea: No tracheal deviation.  Cardiovascular:     Rate and Rhythm: Normal rate and regular rhythm.     Heart sounds: Normal heart sounds.  Pulmonary:     Effort: No respiratory distress.     Breath sounds: No stridor. No wheezing.  Abdominal:     General: Bowel sounds are normal. There is no distension.     Palpations: Abdomen is soft. There is no mass.      Tenderness: There is no abdominal tenderness. There is no guarding or rebound.  Musculoskeletal:        General: No tenderness.  Lymphadenopathy:     Cervical: No cervical adenopathy.  Skin:    Findings: No erythema or rash.  Neurological:     Cranial Nerves: No cranial nerve deficit.     Motor: No abnormal muscle tone.     Coordination: Coordination normal.     Deep Tendon Reflexes: Reflexes normal.  Psychiatric:        Behavior: Behavior normal.        Thought Content: Thought content normal.        Judgment: Judgment normal.     Lab Results  Component Value Date   WBC 8.1 06/30/2018   HGB 14.1 06/30/2018   HCT 41.4 06/30/2018   PLT 353.0 06/30/2018   GLUCOSE 102 (H) 07/13/2018   CHOL 153 04/21/2018   TRIG 88.0 04/21/2018   HDL 39.40 04/21/2018   LDLCALC 96 04/21/2018   ALT 8 07/13/2018   AST 12 07/13/2018   NA 140 07/13/2018   K 3.3 (L) 07/13/2018   CL 102 07/13/2018   CREATININE 0.75 07/13/2018   BUN 12 07/13/2018   CO2 26 07/13/2018   TSH 0.61 04/21/2018   HGBA1C 5.3 10/29/2017    Dg Chest 2 View  Result Date: 10/18/2018 CLINICAL DATA:  Cold and cough symptoms for 2 weeks. EXAM: CHEST - 2 VIEW COMPARISON:  09/16/2018 FINDINGS: The cardiac silhouette, mediastinal and hilar contours are within normal limits and stable. The lungs are clear. No pleural effusions. No worrisome pulmonary lesions. The bony thorax is intact. Stable severe scoliosis. IMPRESSION: No acute cardiopulmonary findings. Electronically Signed   By: Marijo Sanes M.D.   On: 10/18/2018 13:20    Assessment & Plan:   There are no diagnoses linked to this encounter.   No orders of the defined types were placed in this encounter.    Follow-up: No follow-ups on file.  Walker Kehr, MD

## 2018-11-16 NOTE — Assessment & Plan Note (Signed)
On Vit D 

## 2018-11-16 NOTE — Addendum Note (Signed)
Addended by: Cresenciano Lick on: 11/16/2018 04:49 PM   Modules accepted: Orders

## 2018-11-16 NOTE — Assessment & Plan Note (Signed)
Worse Increased Lexapro Added Buspar

## 2018-11-19 ENCOUNTER — Telehealth: Payer: Self-pay | Admitting: Internal Medicine

## 2018-11-19 NOTE — Telephone Encounter (Signed)
Patient would like to speak with "Marzetta Board" PCP nurse regarding the status of [employer] forms. Patient states [facility] is waiting for 11/16/2018 OV notes. Patient states nurse confirmed at 11/16/2018 that forms were received from [place] and would like to speak with PCP nurse today.

## 2018-11-20 ENCOUNTER — Ambulatory Visit (INDEPENDENT_AMBULATORY_CARE_PROVIDER_SITE_OTHER): Payer: 59 | Admitting: Psychology

## 2018-11-20 DIAGNOSIS — F4323 Adjustment disorder with mixed anxiety and depressed mood: Secondary | ICD-10-CM

## 2018-11-20 NOTE — Telephone Encounter (Signed)
I called pt- 11/16/18 OV note faxed to [phone] Attn: Arta Bruce. Pt informed.

## 2018-12-04 ENCOUNTER — Ambulatory Visit (INDEPENDENT_AMBULATORY_CARE_PROVIDER_SITE_OTHER): Payer: 59 | Admitting: Internal Medicine

## 2018-12-04 ENCOUNTER — Encounter: Payer: Self-pay | Admitting: Internal Medicine

## 2018-12-04 ENCOUNTER — Telehealth: Payer: Self-pay | Admitting: Internal Medicine

## 2018-12-04 ENCOUNTER — Ambulatory Visit: Payer: Self-pay | Admitting: *Deleted

## 2018-12-04 DIAGNOSIS — H8110 Benign paroxysmal vertigo, unspecified ear: Secondary | ICD-10-CM

## 2018-12-04 MED ORDER — MECLIZINE HCL 25 MG PO TABS: 25.0000 mg | ORAL_TABLET | Freq: Three times a day (TID) | ORAL | 0 refills | Status: DC | PRN

## 2018-12-04 NOTE — Progress Notes (Signed)
Virtual Visit via Video Note  I connected with CAYLEI HAWKIN on 12/04/18 at  3:00 PM EDT by a video enabled telemedicine application and verified that I am speaking with the correct person using two identifiers.  The patient and the provider were at separate locations throughout the entire encounter.   I discussed the limitations of evaluation and management by telemedicine and the availability of in person appointments. The patient expressed understanding and agreed to proceed.  History of Present Illness: The patient is a 43 y.o. female with visit for dizziness which started around 3AM last night. Started with nausea and she had eaten some pizza before bed. She has been on diet and has not eaten dairy or greasy foods in some time. Was throwing up several times and then started feeling better. Is having dizziness which has stopped as long as she does not move her head. When she tilts her head or bends down. Feels like room is spinning. This stops when she keeps her head still. Has no fevers or chills. Denies SOB or cough or sinus congestion. Overall it is improving with nausea. Has tried nothing  Observations/Objective: Appearance: normal, breathing appears normal, casual grooming, abdomen does not appear distended, throat normal, dizziness with head tilt, memory normal, mental status is A and O times 3  Assessment and Plan: See problem oriented charting  Follow Up Instructions: rx meclizine and instructions given on epley maneuver  I discussed the assessment and treatment plan with the patient. The patient was provided an opportunity to ask questions and all were answered. The patient agreed with the plan and demonstrated an understanding of the instructions.   The patient was advised to call back or seek an in-person evaluation if the symptoms worsen or if the condition fails to improve as anticipated.  Hoyt Koch, MD

## 2018-12-04 NOTE — Assessment & Plan Note (Signed)
Rx meclizine and given epley maneuver instructions.

## 2018-12-04 NOTE — Telephone Encounter (Signed)
Noted  

## 2018-12-04 NOTE — Telephone Encounter (Signed)
Reason for Disposition . [1] MODERATE dizziness (e.g., vertigo; feels very unsteady, interferes with normal activities) AND [2] has NOT been evaluated by physician for this  Answer Assessment - Initial Assessment Questions 1. DESCRIPTION: "Describe your dizziness."     Room spinning, woozy 2. LIGHTHEADED: "Do you feel lightheaded?" (e.g., somewhat faint, woozy, weak upon standing)     Woozy and room spinning 3. VERTIGO: "Do you feel like either you or the room is spinning or tilting?" (i.e. vertigo)     Room is spinning 4. SEVERITY: "How bad is it?"  "Do you feel like you are going to faint?" "Can you stand and walk?"   - MILD - walking normally   - MODERATE - interferes with normal activities (e.g., work, school)    - SEVERE - unable to stand, requires support to walk, feels like passing out now.      Moderate  5. ONSET:  "When did the dizziness begin?"    Started this morning at 3 am 6. AGGRAVATING FACTORS: "Does anything make it worse?" (e.g., standing, change in head position)    Changing positions, moving head, bending over  7. HEART RATE: "Can you tell me your heart rate?" "How many beats in 15 seconds?"  (Note: not all patients can do this)        88 bpm 8. CAUSE: "What do you think is causing the dizziness?"    Unsure  9. RECURRENT SYMPTOM: "Have you had dizziness before?" If so, ask: "When was the last time?" "What happened that time?"     Has had dizzy spells in the past, but not in several years  10. OTHER SYMPTOMS: "Do you have any other symptoms?" (e.g., fever, chest pain, vomiting, diarrhea, bleeding)       Vomiting x 2 episodes, denies fever, diarrhea, bleeding   11. PREGNANCY: "Is there any chance you are pregnant?" "When was your last menstrual period?"       LMP 11/19/2018  Answer Assessment - Initial Assessment Questions 1. DESCRIPTION: "Describe your dizziness."     Room spinning, woozy 2. VERTIGO: "Do you feel like either you or the room is spinning or  tilting?"      Feels like room is spinning 3. LIGHTHEADED: "Do you feel lightheaded?" (e.g., somewhat faint, woozy, weak upon standing)     Slight woozy feeling  4. SEVERITY: "How bad is it?"  "Can you walk?"   - MILD - Feels unsteady but walking normally.   - MODERATE - Feels very unsteady when walking, but not falling; interferes with normal activities (e.g., school, work) .   - SEVERE - Unable to walk without falling (requires assistance).    Mild/moderate  5. ONSET:  "When did the dizziness begin?"     This morning 6. AGGRAVATING FACTORS: "Does anything make it worse?" (e.g., standing, change in head position)    Changing positions, moving head, bending over 7. CAUSE: "What do you think is causing the dizziness?"     Unsure  8. RECURRENT SYMPTOM: "Have you had dizziness before?" If so, ask: "When was the last time?" "What happened that time?"     Had similar dizziness years ago 9. OTHER SYMPTOMS: "Do you have any other symptoms?" (e.g., headache, weakness, numbness, vomiting, earache)     Vomited twice this morning, denies headache, weakness, numbness, earache  10. PREGNANCY: "Is there any chance you are pregnant?" "When was your last menstrual period?"       LMP 11/19/2018  Protocols used: DIZZINESS -  VERTIGO-A-AH, [place]   Patient states she woke up this morning around 3 am and felt dizzy and nauseous.  She had 2 episodes of vomiting and nausea improved.  States she still feels dizzy, described as room spinning when she moves her head, or changes positions.  She states she has experienced similar dizziness many years ago, has not been diagnosed with vertigo.  She denies any numbness, weakness, chest pain, or shortness of breath.  Advised patient she should be seen at PCP office today for a virtual visit as patient does not have anyone who can drive her to office.  Patient scheduled for virtual appointment today at 3pm.

## 2018-12-09 ENCOUNTER — Ambulatory Visit: Payer: 59 | Admitting: Psychology

## 2019-07-02 ENCOUNTER — Ambulatory Visit (INDEPENDENT_AMBULATORY_CARE_PROVIDER_SITE_OTHER): Payer: 59 | Admitting: Family

## 2019-07-02 ENCOUNTER — Encounter: Payer: Self-pay | Admitting: Family

## 2019-07-02 DIAGNOSIS — J209 Acute bronchitis, unspecified: Secondary | ICD-10-CM

## 2019-07-02 MED ORDER — ALBUTEROL SULFATE HFA 108 (90 BASE) MCG/ACT IN AERS: 1.0000 | INHALATION_SPRAY | Freq: Four times a day (QID) | RESPIRATORY_TRACT | 1 refills | Status: DC | PRN

## 2019-07-02 MED ORDER — AZITHROMYCIN 250 MG PO TABS: ORAL_TABLET | ORAL | 0 refills | Status: DC

## 2019-07-02 NOTE — Progress Notes (Signed)
Bridget [name] is a 44 y.o. female with the following history as recorded in [employer]:  Patient Active Problem List   Diagnosis Date Noted  . BPPV (benign paroxysmal positional vertigo) 12/04/2018  . Midsternal chest pain 09/16/2018  . Pulmonary embolism ([place]) 07/22/2018  . Anxiety 07/13/2018  . Arm swelling 07/06/2018  . DOE (dyspnea on exertion) 06/30/2018  . HTN (hypertension) 10/29/2017  . Reactive depression (situational) 10/29/2017  . Pruritic condition 09/25/2016  . Urticaria 09/25/2016  . Anemia 04/23/2016  . Term pregnancy 03/20/2016  . Well adult exam 05/06/2014  . Otitis media 12/13/2011  . Fatigue 12/13/2011  . B12 deficiency 12/13/2011  . Vitamin D deficiency 12/13/2011  . Palpitations 08/06/2010  . Syncope and collapse 08/06/2010  . EDEMA 10/14/2007  . UTI 07/21/2007  . LOW BACK PAIN 07/21/2007  . ELEVATED BP 07/21/2007  . Morbid (severe) obesity due to excess calories ([place]) 11/15/2006  . Migraines 11/15/2006    Current Outpatient Medications  Medication Sig Dispense Refill  . albuterol (VENTOLIN HFA) 108 (90 Base) MCG/ACT inhaler Inhale 1-2 puffs into the lungs every 6 (six) hours as needed for wheezing or shortness of breath. 18 g 1  . azithromycin (ZITHROMAX) 250 MG tablet 2 tabs po qd x 1 day; 1 tablet per day x 4 days; 6 tablet 0  . budesonide-formoterol (SYMBICORT) 160-4.5 MCG/ACT inhaler Inhale 2 puffs into the lungs 2 (two) times daily. 3 Inhaler 3  . busPIRone (BUSPAR) 10 MG tablet Take 1 tablet (10 mg total) by mouth 2 (two) times daily. 60 tablet 5  . cetirizine (ZYRTEC) 10 MG tablet Take 10 mg by mouth daily as needed for allergies.    . Cholecalciferol (VITAMIN D3) 1.25 MG (50000 UT) CAPS Take 1 capsule by mouth once a week. 8 capsule 0  . escitalopram (LEXAPRO) 20 MG tablet Take 1 tablet (20 mg total) by mouth daily. 90 tablet 1  . meclizine (ANTIVERT) 25 MG tablet Take 1 tablet (25 mg total) by mouth 3 (three) times daily as needed for dizziness.  30 tablet 0  . Multiple Vitamin (MULTIVITAMIN) capsule Take 1 capsule by mouth daily.    . pantoprazole (PROTONIX) 40 MG tablet Take 1 tablet (40 mg total) by mouth daily. 30 tablet 3  . Potassium Gluconate 550 MG TABS Take 1 tablet by mouth daily.    . rivaroxaban (XARELTO) 20 MG TABS tablet Take 1 tablet (20 mg total) by mouth daily with supper. 30 tablet 5  . triamterene-hydrochlorothiazide (MAXZIDE-25) 37.5-25 MG tablet Take 1 tablet by mouth daily. Overdue for annual appt must see MD for future refills 90 tablet 3   No current facility-administered medications for this visit.    Allergies: Patient has no known allergies.  Past Medical History:  Diagnosis Date  . Constipation   . Edema   . Headache(784.0)   . Morbid obesity ([place])   . Pulmonary embolism [facility])     Past Surgical History:  Procedure Laterality Date  . BREAST REDUCTION SURGERY    . BREAST SURGERY  2005   reduction  . CESAREAN SECTION N/A 03/20/2016   Procedure: CESAREAN SECTION;  Surgeon: Christophe Louis, MD;  Location: [place] [facility];  Service: Obstetrics;  Laterality: N/A;  . TUBAL LIGATION Bilateral 03/20/2016   Procedure: BILATERAL TUBAL LIGATION;  Surgeon: Christophe Louis, MD;  Location: [place];  Service: Obstetrics;  Laterality: Bilateral;    Family History  Problem Relation Age of Onset  . Deep vein thrombosis Father   .  Diabetes Father   . Hypertension Mother   . Hypertension Other   . Diabetes Paternal Aunt   . Heart disease Maternal Grandmother   . Heart disease Maternal Grandfather   . Diabetes Paternal Grandmother     Social History   Tobacco Use  . Smoking status: Never Smoker  . Smokeless tobacco: Never Used  Substance Use Topics  . Alcohol use: Yes    Comment: occasionally    Subjective:  I connected with AH TROST on 07/02/19 at 10:00 AM EDT by a telephone call and verified that I am speaking with the correct person using two identifiers.   I discussed the limitations of  evaluation and management by telemedicine and the availability of in person appointments. The patient expressed understanding and agreed to proceed. Provider in office/ patient is at home; provider and patient are only 2 people on call.   1 week history of cough/ congestion; does have seasonal allergies; no chest pain/ no shortness of breath;  Has taken both of her COVID vaccines; Is currently using Zyrtec and Symbicort; using OTC Mucinex nasal spray; Requesting refill on albuterol inhaler;    Objective:  There were no vitals filed for this visit.  General: Well developed, well nourished, in no acute distress  Lungs: Respirations unlabored; Neurologic: Alert and oriented; speech intact; face symmetrical;   Assessment:  1. Acute bronchitis, unspecified organism     Plan:  Suspect allergic component; low suspicion for COVID at this time as she has had both vaccines; Continue Zyrtec and Symbicort; refills on albuterol and will treat with Z-pak; increase fluids, rest and follow-up worse, no better. Will need to consider COVID testing if symptoms persist;  Plan for in office visit with her PCP in 2 weeks to discuss long term use of Xarelto;   Time spent 15 minutes  No follow-ups on file.  No orders of the defined types were placed in this encounter.   Requested Prescriptions   Signed Prescriptions Disp Refills  . azithromycin (ZITHROMAX) 250 MG tablet 6 tablet 0    Sig: 2 tabs po qd x 1 day; 1 tablet per day x 4 days;  . albuterol (VENTOLIN HFA) 108 (90 Base) MCG/ACT inhaler 18 g 1    Sig: Inhale 1-2 puffs into the lungs every 6 (six) hours as needed for wheezing or shortness of breath.

## 2019-07-13 ENCOUNTER — Ambulatory Visit (INDEPENDENT_AMBULATORY_CARE_PROVIDER_SITE_OTHER): Payer: 59 | Admitting: Internal Medicine

## 2019-07-13 ENCOUNTER — Other Ambulatory Visit: Payer: Self-pay

## 2019-07-13 ENCOUNTER — Telehealth: Payer: Self-pay

## 2019-07-13 ENCOUNTER — Encounter: Payer: Self-pay | Admitting: Internal Medicine

## 2019-07-13 DIAGNOSIS — D509 Iron deficiency anemia, unspecified: Secondary | ICD-10-CM | POA: Diagnosis not present

## 2019-07-13 DIAGNOSIS — R0600 Dyspnea, unspecified: Secondary | ICD-10-CM

## 2019-07-13 DIAGNOSIS — I10 Essential (primary) hypertension: Secondary | ICD-10-CM | POA: Diagnosis not present

## 2019-07-13 DIAGNOSIS — E538 Deficiency of other specified B group vitamins: Secondary | ICD-10-CM

## 2019-07-13 DIAGNOSIS — I2699 Other pulmonary embolism without acute cor pulmonale: Secondary | ICD-10-CM | POA: Diagnosis not present

## 2019-07-13 DIAGNOSIS — E559 Vitamin D deficiency, unspecified: Secondary | ICD-10-CM | POA: Diagnosis not present

## 2019-07-13 DIAGNOSIS — R0609 Other forms of dyspnea: Secondary | ICD-10-CM

## 2019-07-13 DIAGNOSIS — F329 Major depressive disorder, single episode, unspecified: Secondary | ICD-10-CM

## 2019-07-13 MED ORDER — RIVAROXABAN 20 MG PO TABS: 20.0000 mg | ORAL_TABLET | Freq: Every day | ORAL | 5 refills | Status: DC

## 2019-07-13 MED ORDER — POTASSIUM CHLORIDE CRYS ER 20 MEQ PO TBCR: 20.0000 meq | EXTENDED_RELEASE_TABLET | Freq: Every day | ORAL | 3 refills | Status: DC

## 2019-07-13 NOTE — Assessment & Plan Note (Signed)
D-dimer 

## 2019-07-13 NOTE — Assessment & Plan Note (Signed)
Labs

## 2019-07-13 NOTE — Progress Notes (Signed)
Subjective:  Patient ID: Bridget [name], female    DOB: 01/20/1976  Age: 44 y.o. MRN: [id number]  CC: No chief complaint on file.   HPI AVIANAH STAHR presents for PE, SOB w/o Xarelto, obesity f/u Just took a Z pack for URI  Outpatient Medications Prior to Visit  Medication Sig Dispense Refill  . albuterol (VENTOLIN HFA) 108 (90 Base) MCG/ACT inhaler Inhale 1-2 puffs into the lungs every 6 (six) hours as needed for wheezing or shortness of breath. 18 g 1  . budesonide-formoterol (SYMBICORT) 160-4.5 MCG/ACT inhaler Inhale 2 puffs into the lungs 2 (two) times daily. 3 Inhaler 3  . busPIRone (BUSPAR) 10 MG tablet Take 1 tablet (10 mg total) by mouth 2 (two) times daily. 60 tablet 5  . cetirizine (ZYRTEC) 10 MG tablet Take 10 mg by mouth daily as needed for allergies.    . Cholecalciferol (VITAMIN D3) 1.25 MG (50000 UT) CAPS Take 1 capsule by mouth once a week. 8 capsule 0  . escitalopram (LEXAPRO) 20 MG tablet Take 1 tablet (20 mg total) by mouth daily. 90 tablet 1  . meclizine (ANTIVERT) 25 MG tablet Take 1 tablet (25 mg total) by mouth 3 (three) times daily as needed for dizziness. 30 tablet 0  . Multiple Vitamin (MULTIVITAMIN) capsule Take 1 capsule by mouth daily.    . pantoprazole (PROTONIX) 40 MG tablet Take 1 tablet (40 mg total) by mouth daily. 30 tablet 3  . Potassium Gluconate 550 MG TABS Take 1 tablet by mouth daily.    . rivaroxaban (XARELTO) 20 MG TABS tablet Take 1 tablet (20 mg total) by mouth daily with supper. 30 tablet 5  . triamterene-hydrochlorothiazide (MAXZIDE-25) 37.5-25 MG tablet Take 1 tablet by mouth daily. Overdue for annual appt must see MD for future refills 90 tablet 3  . azithromycin (ZITHROMAX) 250 MG tablet 2 tabs po qd x 1 day; 1 tablet per day x 4 days; 6 tablet 0   No facility-administered medications prior to visit.    ROS: Review of Systems  Constitutional: Positive for unexpected weight change. Negative for activity change, appetite change,  chills and fatigue.  HENT: Negative for congestion, mouth sores and sinus pressure.   Eyes: Negative for visual disturbance.  Respiratory: Positive for shortness of breath. Negative for cough and chest tightness.   Gastrointestinal: Negative for abdominal pain and nausea.  Genitourinary: Negative for difficulty urinating, frequency and vaginal pain.  Musculoskeletal: Positive for back pain. Negative for gait problem.  Skin: Negative for pallor and rash.  Neurological: Negative for dizziness, tremors, weakness, numbness and headaches.  Psychiatric/Behavioral: Negative for confusion, sleep disturbance and suicidal ideas. The patient is nervous/anxious.     Objective:  BP 130/88 (BP Location: Left Arm, Patient Position: Sitting, Cuff Size: Large)   Pulse 95   Temp 98 F (36.7 C) (Oral)   Ht 5\' 4"  (1.626 m)   Wt (!) 331 lb (150.1 kg)   SpO2 95%   BMI 56.82 kg/m   BP Readings from Last 3 Encounters:  07/13/19 130/88  11/16/18 128/80  10/18/18 (!) 142/98    Wt Readings from Last 3 Encounters:  07/13/19 (!) 331 lb (150.1 kg)  11/16/18 (!) 319 lb (144.7 kg)  09/16/18 (!) 315 lb (142.9 kg)    Physical Exam Constitutional:      General: She is not in acute distress.    Appearance: She is well-developed.  HENT:     Head: Normocephalic.     Right Ear:  External ear normal.     Left Ear: External ear normal.     Nose: Nose normal.  Eyes:     General:        Right eye: No discharge.        Left eye: No discharge.     Conjunctiva/sclera: Conjunctivae normal.     Pupils: Pupils are equal, round, and reactive to light.  Neck:     Thyroid: No thyromegaly.     Vascular: No JVD.     Trachea: No tracheal deviation.  Cardiovascular:     Rate and Rhythm: Normal rate and regular rhythm.     Heart sounds: Normal heart sounds.  Pulmonary:     Effort: No respiratory distress.     Breath sounds: No stridor. No wheezing.  Abdominal:     General: Bowel sounds are normal. There is no  distension.     Palpations: Abdomen is soft. There is no mass.     Tenderness: There is no abdominal tenderness. There is no guarding or rebound.  Musculoskeletal:        General: No tenderness.     Cervical back: Normal range of motion and neck supple.  Lymphadenopathy:     Cervical: No cervical adenopathy.  Skin:    Findings: No erythema or rash.  Neurological:     Mental Status: She is oriented to person, place, and time.     Cranial Nerves: No cranial nerve deficit.     Motor: No abnormal muscle tone.     Coordination: Coordination normal.     Deep Tendon Reflexes: Reflexes normal.  Psychiatric:        Behavior: Behavior normal.        Thought Content: Thought content normal.        Judgment: Judgment normal.     Lab Results  Component Value Date   WBC 8.1 06/30/2018   HGB 14.1 06/30/2018   HCT 41.4 06/30/2018   PLT 353.0 06/30/2018   GLUCOSE 102 (H) 07/13/2018   CHOL 153 04/21/2018   TRIG 88.0 04/21/2018   HDL 39.40 04/21/2018   LDLCALC 96 04/21/2018   ALT 8 07/13/2018   AST 12 07/13/2018   NA 140 07/13/2018   K 3.3 (L) 07/13/2018   CL 102 07/13/2018   CREATININE 0.75 07/13/2018   BUN 12 07/13/2018   CO2 26 07/13/2018   TSH 0.61 04/21/2018   HGBA1C 5.3 10/29/2017    DG Chest 2 View  Result Date: 10/18/2018 CLINICAL DATA:  Cold and cough symptoms for 2 weeks. EXAM: CHEST - 2 VIEW COMPARISON:  09/16/2018 FINDINGS: The cardiac silhouette, mediastinal and hilar contours are within normal limits and stable. The lungs are clear. No pleural effusions. No worrisome pulmonary lesions. The bony thorax is intact. Stable severe scoliosis. IMPRESSION: No acute cardiopulmonary findings. Electronically Signed   By: Marijo Sanes M.D.   On: 10/18/2018 13:20    Assessment & Plan:

## 2019-07-13 NOTE — Assessment & Plan Note (Signed)
On B12 

## 2019-07-13 NOTE — Assessment & Plan Note (Signed)
Sleeve procedure is planned at [facility] 

## 2019-07-13 NOTE — Assessment & Plan Note (Signed)
discussed

## 2019-07-13 NOTE — Telephone Encounter (Signed)
Pt seen this afternoon.

## 2019-07-13 NOTE — Telephone Encounter (Signed)
Patient calling and spoke with [facility] on  07/12/2019 7:01:23 PM and states she may have missed her dose of Xarelto 20 mg and she wants to know if it will hurt her if she takes another dose just in case. She is also having shortness of breath x 2 weeks. She had a PE last year and that's why she is on Xarelto. She was treated 2 weeks ago for bronchitis and the sob went away, but now it is back  Advise to go to ED.

## 2019-07-13 NOTE — Assessment & Plan Note (Signed)
On Xarelto She is SOB w/o Xarelto Hem referral

## 2019-07-14 ENCOUNTER — Emergency Department (HOSPITAL_BASED_OUTPATIENT_CLINIC_OR_DEPARTMENT_OTHER)
Admission: EM | Admit: 2019-07-14 | Discharge: 2019-07-14 | Disposition: A | Discharge status: Home / Self Care | Payer: 59 | Attending: Emergency Medicine | Admitting: Emergency Medicine

## 2019-07-14 ENCOUNTER — Other Ambulatory Visit: Payer: Self-pay

## 2019-07-14 ENCOUNTER — Encounter (HOSPITAL_BASED_OUTPATIENT_CLINIC_OR_DEPARTMENT_OTHER): Payer: Self-pay | Admitting: *Deleted

## 2019-07-14 ENCOUNTER — Emergency Department (HOSPITAL_BASED_OUTPATIENT_CLINIC_OR_DEPARTMENT_OTHER): Payer: 59

## 2019-07-14 ENCOUNTER — Other Ambulatory Visit: Payer: Self-pay | Admitting: Internal Medicine

## 2019-07-14 DIAGNOSIS — I10 Essential (primary) hypertension: Secondary | ICD-10-CM | POA: Insufficient documentation

## 2019-07-14 DIAGNOSIS — R0789 Other chest pain: Secondary | ICD-10-CM | POA: Insufficient documentation

## 2019-07-14 DIAGNOSIS — R0602 Shortness of breath: Secondary | ICD-10-CM

## 2019-07-14 DIAGNOSIS — Z20822 Contact with and (suspected) exposure to covid-19: Secondary | ICD-10-CM | POA: Insufficient documentation

## 2019-07-14 DIAGNOSIS — Z79899 Other long term (current) drug therapy: Secondary | ICD-10-CM | POA: Diagnosis not present

## 2019-07-14 DIAGNOSIS — Z7901 Long term (current) use of anticoagulants: Secondary | ICD-10-CM | POA: Diagnosis not present

## 2019-07-14 LAB — CBC WITH DIFFERENTIAL/PLATELET
Abs Immature Granulocytes: 0.02 10*3/uL (ref 0.00–0.07)
Basophils Absolute: 0 10*3/uL (ref 0.0–0.1)
Basophils Absolute: 0.1 10*3/uL (ref 0.0–0.1)
Basophils Relative: 0.4 % (ref 0.0–3.0)
Basophils Relative: 1 %
Eosinophils Absolute: 0.2 10*3/uL (ref 0.0–0.7)
Eosinophils Absolute: 0.3 10*3/uL (ref 0.0–0.5)
Eosinophils Relative: 2.8 % (ref 0.0–5.0)
Eosinophils Relative: 3 %
HCT: 32.2 % — ABNORMAL LOW (ref 36.0–46.0)
HCT: 34.7 % — ABNORMAL LOW (ref 36.0–46.0)
Hemoglobin: 10.7 g/dL — ABNORMAL LOW (ref 12.0–15.0)
Hemoglobin: 11.1 g/dL — ABNORMAL LOW (ref 12.0–15.0)
Immature Granulocytes: 0 %
Lymphocytes Relative: 31.4 % (ref 12.0–46.0)
Lymphocytes Relative: 31 %
Lymphs Abs: 2.2 10*3/uL (ref 0.7–4.0)
Lymphs Abs: 2.7 10*3/uL (ref 0.7–4.0)
MCH: 27.3 pg (ref 26.0–34.0)
MCHC: 33.2 g/dL (ref 30.0–36.0)
MCHC: 32 g/dL (ref 30.0–36.0)
MCV: 85.8 fl (ref 78.0–100.0)
MCV: 85.5 fL (ref 80.0–100.0)
Monocytes Absolute: 0.5 10*3/uL (ref 0.1–1.0)
Monocytes Absolute: 0.7 10*3/uL (ref 0.1–1.0)
Monocytes Relative: 6.9 % (ref 3.0–12.0)
Monocytes Relative: 8 %
Neutro Abs: 4.1 10*3/uL (ref 1.4–7.7)
Neutro Abs: 5 10*3/uL (ref 1.7–7.7)
Neutrophils Relative %: 58.5 % (ref 43.0–77.0)
Neutrophils Relative %: 57 %
Platelets: 376 10*3/uL (ref 150.0–400.0)
Platelets: 412 10*3/uL — ABNORMAL HIGH (ref 150–400)
RBC: 3.76 Mil/uL — ABNORMAL LOW (ref 3.87–5.11)
RBC: 4.06 MIL/uL (ref 3.87–5.11)
RDW: 16.1 % — ABNORMAL HIGH (ref 11.5–15.5)
RDW: 15.2 % (ref 11.5–15.5)
WBC: 7 10*3/uL (ref 4.0–10.5)
WBC: 8.7 10*3/uL (ref 4.0–10.5)
nRBC: 0 % (ref 0.0–0.2)

## 2019-07-14 LAB — BASIC METABOLIC PANEL
BUN: 12 mg/dL (ref 6–23)
CO2: 28 mEq/L (ref 19–32)
Calcium: 8.7 mg/dL (ref 8.4–10.5)
Chloride: 101 mEq/L (ref 96–112)
Creatinine, Ser: 0.66 mg/dL (ref 0.40–1.20)
GFR: 118.78 mL/min (ref >60.00)
Glucose, Bld: 99 mg/dL (ref 70–99)
Potassium: 3.4 mEq/L — ABNORMAL LOW (ref 3.5–5.1)
Sodium: 138 mEq/L (ref 135–145)

## 2019-07-14 LAB — COMPREHENSIVE METABOLIC PANEL
ALT: 11 U/L (ref 0–44)
AST: 18 U/L (ref 15–41)
Albumin: 3.6 g/dL (ref 3.5–5.0)
Alkaline Phosphatase: 46 U/L (ref 38–126)
Anion gap: 10 (ref 5–15)
BUN: 9 mg/dL (ref 6–20)
CO2: 25 mmol/L (ref 22–32)
Calcium: 8.8 mg/dL — ABNORMAL LOW (ref 8.9–10.3)
Chloride: 102 mmol/L (ref 98–111)
Creatinine, Ser: 0.57 mg/dL (ref 0.44–1.00)
GFR calc Af Amer: >60 mL/min (ref >60)
GFR calc non Af Amer: >60 mL/min (ref >60)
Glucose, Bld: 109 mg/dL — ABNORMAL HIGH (ref 70–99)
Potassium: 3.4 mmol/L — ABNORMAL LOW (ref 3.5–5.1)
Sodium: 137 mmol/L (ref 135–145)
Total Bilirubin: 0.6 mg/dL (ref 0.3–1.2)
Total Protein: 7.4 g/dL (ref 6.5–8.1)

## 2019-07-14 LAB — URINALYSIS, ROUTINE W REFLEX MICROSCOPIC
Bilirubin Urine: NEGATIVE
Glucose, UA: NEGATIVE mg/dL
Hgb urine dipstick: NEGATIVE
Ketones, ur: NEGATIVE mg/dL
Leukocytes,Ua: NEGATIVE
Nitrite: NEGATIVE
Protein, ur: NEGATIVE mg/dL
Specific Gravity, Urine: 1.02 (ref 1.005–1.030)
pH: 6.5 (ref 5.0–8.0)

## 2019-07-14 LAB — T4, FREE: Free T4: 1.06 ng/dL (ref 0.60–1.60)

## 2019-07-14 LAB — IRON,TIBC AND FERRITIN PANEL
%SAT: 9 % (calc) — ABNORMAL LOW (ref 16–45)
Ferritin: 8 ng/mL — ABNORMAL LOW (ref 16–232)
Iron: 31 ug/dL — ABNORMAL LOW (ref 40–190)
TIBC: 348 mcg/dL (calc) (ref 250–450)

## 2019-07-14 LAB — VITAMIN D 25 HYDROXY (VIT D DEFICIENCY, FRACTURES): VITD: 20.93 ng/mL — ABNORMAL LOW (ref 30.00–100.00)

## 2019-07-14 LAB — RESPIRATORY PANEL BY RT PCR (FLU A&B, COVID)
Influenza A by PCR: NEGATIVE
Influenza B by PCR: NEGATIVE
SARS Coronavirus 2 by RT PCR: NEGATIVE

## 2019-07-14 LAB — TSH: TSH: 0.88 u[IU]/mL (ref 0.35–4.50)

## 2019-07-14 LAB — D-DIMER, QUANTITATIVE: D-Dimer, Quant: 0.3 mcg/mL FEU (ref <0.50)

## 2019-07-14 LAB — TROPONIN I (HIGH SENSITIVITY): Troponin I (High Sensitivity): 3 ng/L (ref <18)

## 2019-07-14 LAB — VITAMIN B12: Vitamin B-12: 150 pg/mL — ABNORMAL LOW (ref 211–911)

## 2019-07-14 LAB — LIPASE, BLOOD: Lipase: 26 U/L (ref 11–51)

## 2019-07-14 LAB — PREGNANCY, URINE: Preg Test, Ur: NEGATIVE

## 2019-07-14 MED ORDER — VITAMIN D3 1.25 MG (50000 UT) PO CAPS: 1.0000 | ORAL_CAPSULE | ORAL | 3 refills | Status: DC

## 2019-07-14 MED ORDER — POTASSIUM CHLORIDE CRYS ER 20 MEQ PO TBCR
40.0000 meq | EXTENDED_RELEASE_TABLET | Freq: Once | ORAL | Status: AC
  Administered 2019-07-14: 40 meq via ORAL
  Filled 2019-07-14: qty 2

## 2019-07-14 MED ORDER — VITAMIN B-12 1000 MCG SL SUBL
1.0000 | SUBLINGUAL_TABLET | Freq: Every day | SUBLINGUAL | 3 refills | Status: DC
Start: 2019-07-14 — End: 2019-08-04

## 2019-07-14 NOTE — Discharge Instructions (Signed)
Please follow-up with your primary care doctor for follow-up exam in the next week.  Please return to ED if you have any new or concerning symptoms. Your exam today was very reassuring.  Your oxygen levels were within normal limits and her vital signs were all very reassuring.

## 2019-07-14 NOTE — ED Provider Notes (Signed)
[facility] EMERGENCY DEPARTMENT Provider Note   CSN: [id number] Arrival date & time: 07/14/19  1647     History Chief Complaint  Patient presents with  . Shortness of Breath    Bridget [name] is a 44 y.o. female.  HPI  Patient is 44 year old female with past medical history significant for lower extremity edema bilaterally, morbid obesity, pulmonary embolism on Xarelto  Patient presents today with feeling of shortness of breath today.  She states that she feels winded when she walks and endorses occasional palpitations however denies any chest pain.  She states that she actually started having symptoms 2 days ago however symptoms worsened today.  She does however endorse some tightness in her chest. She states that she feels some "fullness" in her back.  She denies any missed doses of her medications.  Patient states she has had kidney stones in the past and is concerned for kidney stone.  She is also concerned given her history of PE.  She was seen by her primary care doctor yesterday who ordered some labs on her and recommended presented to ED if she has any new or concerning symptoms.  She appropriately followed up on these recommendations.  On my review of EMR patient has a history of palpitations.  She has a history of anemia, hypertension, and BPPV.     Past Medical History:  Diagnosis Date  . Constipation   . Edema   . Headache(784.0)   . Morbid obesity ([place])   . Pulmonary embolism [facility])     Patient Active Problem List   Diagnosis Date Noted  . BPPV (benign paroxysmal positional vertigo) 12/04/2018  . Midsternal chest pain 09/16/2018  . Pulmonary embolism ([place]) 07/22/2018  . Anxiety 07/13/2018  . Arm swelling 07/06/2018  . DOE (dyspnea on exertion) 06/30/2018  . HTN (hypertension) 10/29/2017  . Reactive depression (situational) 10/29/2017  . Pruritic condition 09/25/2016  . Urticaria 09/25/2016  . Anemia 04/23/2016  . Term pregnancy 03/20/2016    . Well adult exam 05/06/2014  . Otitis media 12/13/2011  . Fatigue 12/13/2011  . B12 deficiency 12/13/2011  . Vitamin D deficiency 12/13/2011  . Palpitations 08/06/2010  . Syncope and collapse 08/06/2010  . EDEMA 10/14/2007  . UTI 07/21/2007  . LOW BACK PAIN 07/21/2007  . ELEVATED BP 07/21/2007  . Morbid (severe) obesity due to excess calories ([place]) 11/15/2006  . Migraines 11/15/2006    Past Surgical History:  Procedure Laterality Date  . BREAST REDUCTION SURGERY    . BREAST SURGERY  2005   reduction  . CESAREAN SECTION N/A 03/20/2016   Procedure: CESAREAN SECTION;  Surgeon: Christophe Louis, MD;  Location: [place];  Service: Obstetrics;  Laterality: N/A;  . TUBAL LIGATION Bilateral 03/20/2016   Procedure: BILATERAL TUBAL LIGATION;  Surgeon: Christophe Louis, MD;  Location: [place];  Service: Obstetrics;  Laterality: Bilateral;     OB History    Gravida  7   Para  2   Term  2   Preterm      AB  5   Living  2     SAB  3   TAB  2   Ectopic      Multiple  0   Live Births  2           Family History  Problem Relation Age of Onset  . Deep vein thrombosis Father   . Diabetes Father   . Hypertension Mother   . Hypertension Other   .  Diabetes Paternal Aunt   . Heart disease Maternal Grandmother   . Heart disease Maternal Grandfather   . Diabetes Paternal Grandmother     Social History   Tobacco Use  . Smoking status: Never Smoker  . Smokeless tobacco: Never Used  Substance Use Topics  . Alcohol use: Yes    Comment: occasionally  . Drug use: No    Home Medications Prior to Admission medications   Medication Sig Start Date End Date Taking? Authorizing Provider  albuterol (VENTOLIN HFA) 108 (90 Base) MCG/ACT inhaler Inhale 1-2 puffs into the lungs every 6 (six) hours as needed for wheezing or shortness of breath. 07/02/19   Marrian Salvage, FNP  budesonide-formoterol [facility]) 160-4.5 MCG/ACT inhaler Inhale 2 puffs into the lungs 2  (two) times daily. 08/19/18 08/19/19  Plotnikov, Evie Lacks, MD  busPIRone (BUSPAR) 10 MG tablet Take 1 tablet (10 mg total) by mouth 2 (two) times daily. 11/16/18   Plotnikov, Evie Lacks, MD  cetirizine (ZYRTEC) 10 MG tablet Take 10 mg by mouth daily as needed for allergies.    [provider]  Cholecalciferol (VITAMIN D3) 1.25 MG (50000 UT) CAPS Take 1 capsule by mouth once a week. 07/14/19   Plotnikov, Evie Lacks, MD  Cyanocobalamin (VITAMIN B-12) 1000 MCG SUBL Place 1 tablet (1,000 mcg total) under the tongue daily. 07/14/19   Plotnikov, Evie Lacks, MD  escitalopram (LEXAPRO) 20 MG tablet Take 1 tablet (20 mg total) by mouth daily. 11/16/18   Plotnikov, Evie Lacks, MD  meclizine (ANTIVERT) 25 MG tablet Take 1 tablet (25 mg total) by mouth 3 (three) times daily as needed for dizziness. 12/04/18   Hoyt Koch, MD  Multiple Vitamin (MULTIVITAMIN) capsule Take 1 capsule by mouth daily.    [provider]  pantoprazole (PROTONIX) 40 MG tablet Take 1 tablet (40 mg total) by mouth daily. 09/16/18   Plotnikov, Evie Lacks, MD  potassium chloride SA (KLOR-CON) 20 MEQ tablet Take 1 tablet (20 mEq total) by mouth daily. 07/13/19   Plotnikov, Evie Lacks, MD  rivaroxaban (XARELTO) 20 MG TABS tablet Take 1 tablet (20 mg total) by mouth daily with supper. 07/13/19   Plotnikov, Evie Lacks, MD  triamterene-hydrochlorothiazide (MAXZIDE-25) 37.5-25 MG tablet Take 1 tablet by mouth daily. Overdue for annual appt must see MD for future refills 04/21/18   Plotnikov, Evie Lacks, MD    Allergies    Patient has no known allergies.  Review of Systems   Review of Systems  Constitutional: Negative for chills and fever.  HENT: Negative for congestion.   Eyes: Negative for pain.  Respiratory: Positive for chest tightness and shortness of breath. Negative for cough.   Cardiovascular: Negative for chest pain and leg swelling.  Gastrointestinal: Negative for abdominal pain, diarrhea, nausea and vomiting.    Genitourinary: Negative for dysuria.  Musculoskeletal: Negative for myalgias.  Skin: Negative for rash.  Neurological: Negative for dizziness and headaches.    Physical Exam Updated Vital Signs BP 107/69   Pulse 77   Temp 98.6 F (37 C) (Oral)   Resp 16   Ht 5\' 4"  (1.626 m)   Wt (!) 149.7 kg   LMP 07/09/2019   SpO2 99%   BMI 56.64 kg/m   Physical Exam Vitals and nursing note reviewed.  Constitutional:      General: She is not in acute distress.    Appearance: She is obese.     Comments: Patient is pleasant, obese, 44 year old female no acute distress.  Speaking full  sentence without difficulty.  HENT:     Head: Normocephalic and atraumatic.     Nose: Nose normal.  Eyes:     General: No scleral icterus. Cardiovascular:     Rate and Rhythm: Normal rate and regular rhythm.     Pulses: Normal pulses.     Heart sounds: Normal heart sounds.  Pulmonary:     Effort: Pulmonary effort is normal. No respiratory distress.     Breath sounds: No wheezing.     Comments: Normal respiratory rate.  SPO2 96% on room air. Lungs are clear to auscultation bilaterally. Abdominal:     Palpations: Abdomen is soft.     Tenderness: There is no abdominal tenderness. There is no right CVA tenderness, left CVA tenderness, guarding or rebound.  Musculoskeletal:     Cervical back: Normal range of motion.     Right lower leg: No edema.     Left lower leg: No edema.  Skin:    General: Skin is warm and dry.     Capillary Refill: Capillary refill takes less than 2 seconds.  Neurological:     Mental Status: She is alert. Mental status is at baseline.  Psychiatric:        Mood and Affect: Mood normal.        Behavior: Behavior normal.     ED Results / Procedures / Treatments   Labs (all labs ordered are listed, but only abnormal results are displayed) Labs Reviewed  CBC WITH DIFFERENTIAL/PLATELET - Abnormal; Notable for the following components:      Result Value   Hemoglobin 11.1 (*)     HCT 34.7 (*)    Platelets 412 (*)    All other components within normal limits  COMPREHENSIVE METABOLIC PANEL - Abnormal; Notable for the following components:   Potassium 3.4 (*)    Glucose, Bld 109 (*)    Calcium 8.8 (*)    All other components within normal limits  URINALYSIS, ROUTINE W REFLEX MICROSCOPIC - Abnormal; Notable for the following components:   APPearance CLOUDY (*)    All other components within normal limits  RESPIRATORY PANEL BY RT PCR (FLU A&B, COVID)  LIPASE, BLOOD  PREGNANCY, URINE  TROPONIN I (HIGH SENSITIVITY)    EKG EKG Interpretation  Date/Time:  Wednesday July 14 2019 16:51:51 EDT Ventricular Rate:  105 PR Interval:  144 QRS Duration: 74 QT Interval:  328 QTC Calculation: 433 R Axis:   45 Text Interpretation: Sinus tachycardia Nonspecific T wave abnormality Abnormal ECG No significant change since last tracing Confirmed by Fredia Sorrow [phone]) on 07/14/2019 5:08:23 PM   Radiology DG Chest 2 View  Result Date: 07/14/2019 CLINICAL DATA:  Patient with shortness of breath and chest pain. EXAM: CHEST - 2 VIEW COMPARISON:  Chest radiograph 10/18/2018 FINDINGS: Stable cardiac and mediastinal contours. No consolidative pulmonary opacities. No pleural effusion or pneumothorax. Thoracic spine degenerative changes. IMPRESSION: No acute cardiopulmonary process. Electronically Signed   By: Lovey Newcomer M.D.   On: 07/14/2019 18:22    Procedures Procedures (including critical care time)  Medications Ordered in ED Medications  potassium chloride SA (KLOR-CON) CR tablet 40 mEq (40 mEq Oral Given 07/14/19 1951)    ED Course  I have reviewed the triage vital signs and the nursing notes.  Pertinent labs & imaging results that were available during my care of the patient were reviewed by me and considered in my medical decision making (see chart for details).  Patient is a 44 year old female with a  history of chest tightness and shortness of breath for the past  2 days.  Patient has a history of PEs and is on no active blood thinner which she states she takes without any missed doses.  She denies any chest pain.  She states that it feels somewhat tight in her chest.  She sees a internal medicine doctor as her PCP who ordered a D-dimer yesterday which was within normal limits.  Follow-up  The causes for shortness of breath include but are not limited to Cardiac (AHF, pericardial effusion and tamponade, arrhythmias, ischemia, etc) Respiratory (COPD, asthma, pneumonia, pneumothorax, primary pulmonary hypertension, PE/VQ mismatch) Hematological (anemia) Neuromuscular (ALS, Guillain-Barr, etc)  Doubt PE as patient is without any hemoptysis, tachycardia, hypoxia and speaking in full sentences without difficulty.  I have very reassuring physical exam and she has negative D-dimer yesterday.  Patient has a history of anemia and her hemoglobin is somewhat lower on her labs yesterday may have been in the past we will obtain basic labs, CBC, CMP, troponin X1, lipase, pregnancy, urine, Covid swab.  Clinical Course as of Jul 13 2217  Wed Jul 14, 2019  [place] Patient ambulated on room air SPO2 99%.   [WF]  1842 Mild hypokalemia.  Will replete with oral potassium.  CBC with very mild anemia however is trending upwards from her most recent lab work. Lipase and troponin within normal limits.  Doubt pancreatitis or ACS.  Doubt myocarditis which was the reason for ordering troponin.  Pregnancy is negative.  Urinalysis without any abnormalities.  No evidence of infection.  Respiratory panel negative for flu with Covid.  Patient had negative D-dimer yesterday.  Doubt PE as patient is on anticoagulation and has had negative dimer.   [WF]  1843 Chest x-ray independently viewed by myself.  No evidence of acute pathology.  EKG is nonischemic with no acute abnormality.  She is mildly tachycardic on EKG however on my assessment heart rate is 89-92 range   [WF]    Clinical  Course User Index [WF] Tedd Sias, PA   MDM Rules/Calculators/A&P                      The medical records were personally reviewed by myself. I personally reviewed all lab results and interpreted all imaging studies and either concurred with their official read or contacted radiology for clarification. Additional history obtained from old records/EMS/family members.   This patient appears reasonably screened and I doubt any other medical condition requiring further workup, evaluation, or treatment in the ED at this time prior to discharge.   Patient's vitals are WNL apart from vital sign abnormalities discussed above, patient is in NAD, and able to ambulate in the ED at their baseline and able to tolerate PO.  Pain has been managed or a plan has been made for home management and has no complaints prior to discharge. Patient is comfortable with above plan and for discharge at this time. All questions were answered prior to disposition. Results from the ER workup discussed with the patient face to face and all questions answered to the best of my ability. The patient is safe for discharge with strict return precautions. Patient appears safe for discharge with appropriate follow-up. Conveyed my impression with the patient and they voiced understanding and are agreeable to plan.   An After Visit Summary was printed and given to the patient.  Portions of this note were generated with [employer]. Dictation errors may occur despite  best attempts at proofreading.    Final Clinical Impression(s) / ED Diagnoses Final diagnoses:  SOB (shortness of breath)    Rx / DC Orders ED Discharge Orders    None       Tedd Sias, [place] 07/14/19 2222    Fredia Sorrow, MD 07/17/19 [phone]

## 2019-07-14 NOTE — Telephone Encounter (Signed)
Pt notified by team heath and Jamie(front staff) to go to ED

## 2019-07-14 NOTE — ED Notes (Signed)
Pt ambulated to bathroom.  No acute respiratory distress noted.  Pt states she feels like she cannot take a deep breath.  O2 sats 98-100%.  HR 108

## 2019-07-14 NOTE — Telephone Encounter (Signed)
F/u  Seen on yesterday   Per patient called voiced last night she was having some sob & tightness in back, No emergency room .   Patient voiced still feels bad.   Patient aware I will be transferring her over to a triage nurse for assessment.

## 2019-07-14 NOTE — ED Triage Notes (Signed)
C/o SOB and chest tightness x 2 days

## 2019-07-19 ENCOUNTER — Ambulatory Visit: Payer: 59

## 2019-07-19 ENCOUNTER — Telehealth: Payer: Self-pay | Admitting: Hematology

## 2019-07-19 NOTE — Telephone Encounter (Signed)
Received a new hem referral from Dr. Alain Marion for - Other acute pulmonary embolism without acute cor pulmonale> Bridget [name] has been cld and scheduled to see dr. Irene Limbo on 5/12 at 1pm. Pt aware to arrive 15 minutes early.

## 2019-07-28 ENCOUNTER — Telehealth: Payer: Self-pay | Admitting: Hematology

## 2019-07-28 ENCOUNTER — Inpatient Hospital Stay: Payer: 59 | Admitting: Hematology

## 2019-07-28 NOTE — Telephone Encounter (Signed)
Received a call from Bridget [name] to reschedule her new hem appt w/Dr. Irene Limbo due to a foot injury. Bridget [name] has been reschedule to see Dr. Irene Limbo on 6/3 at 11am.

## 2019-07-28 NOTE — Progress Notes (Incomplete)
? ? ?  HEMATOLOGY/ONCOLOGY CONSULTATION NOTE ? ?Date of Service: 07/28/2019 ? ?Patient Care Team: ?Plotnikov, Evie Lacks, MD as PCP - General ? ?CHIEF COMPLAINTS/PURPOSE OF CONSULTATION:  ?Acute PE ? ?HISTORY OF PRESENTING ILLNESS:  ?Bridget [name] is a wonderful 44 y.o. female who has been referred to [place] by Dr. Alain Marion for evaluation and management of acute Pulmonary Embolism. Pt is accompanied today by ***. The pt reports that she is doing well overall.  ? ?The pt reports ***  ? ?Of note prior to the patient's visit today, pt has had *** completed on *** with results revealing ***.  ? ?Most recent lab results (07/14/2019) of CBC w/diff and CMP is as follows: all values are WNL except for Hgb at 11.1, HCT at 34.7, PLT at 412K, Potassium at 3.4, Glucose at 109, Calcium at 8.8. ? ?On review of systems, pt reports *** and denies ***and any other symptoms.  ? ?On PMHx the pt reports ***. ?On Social Hx the pt reports *** ?On Family Hx the pt reports *** ? ?A: ?-Discussed patient's most recent labs from ***, *** ?-*** ? ?MEDICAL HISTORY:  ?Past Medical History:  ?Diagnosis Date  ?? Constipation   ?? Edema   ?? Headache(784.0)   ?? Morbid obesity ([place])   ?? Pulmonary embolism ([place])   ? ? ?SURGICAL HISTORY: ?Past Surgical History:  ?Procedure Laterality Date  ?? BREAST REDUCTION SURGERY    ?? BREAST SURGERY  2005  ? reduction  ?? CESAREAN SECTION N/A 03/20/2016  ? Procedure: CESAREAN SECTION;  Surgeon: Christophe Louis, MD;  Location: [place];  Service: Obstetrics;  Laterality: N/A;  ?? TUBAL LIGATION Bilateral 03/20/2016  ? Procedure: BILATERAL TUBAL LIGATION;  Surgeon: Christophe Louis, MD;  Location: [place];  Service: Obstetrics;  Laterality: Bilateral;  ? ? ?SOCIAL HISTORY: ?Social History  ? ?Socioeconomic History  ?? Marital status: Married  ?  Spouse name: Not on file  ?? Number of children: 1  ?? Years of education: Not on file  ?? Highest education level: Not on file  ?Occupational History  ?   Employer: [employer]  ?Tobacco Use  ?? Smoking status: Ne

## 2019-08-02 ENCOUNTER — Telehealth: Payer: Self-pay | Admitting: Internal Medicine

## 2019-08-02 NOTE — Telephone Encounter (Signed)
LM notifying pt that we will have to see her first

## 2019-08-02 NOTE — Telephone Encounter (Signed)
° °  Patient states 2 weeks ago a chair landed on her foot. Since then the foot has been swollen on top, purple around her toes. She walks gently on it, but can not wear a shoe. Appointment scheduled for 5/18, however patient wants to know if she needs to see a specialist instead.

## 2019-08-03 ENCOUNTER — Ambulatory Visit: Payer: 59 | Admitting: Internal Medicine

## 2019-08-04 ENCOUNTER — Encounter: Payer: Self-pay | Admitting: Internal Medicine

## 2019-08-04 ENCOUNTER — Other Ambulatory Visit: Payer: Self-pay

## 2019-08-04 ENCOUNTER — Ambulatory Visit (INDEPENDENT_AMBULATORY_CARE_PROVIDER_SITE_OTHER): Payer: 59 | Admitting: Internal Medicine

## 2019-08-04 ENCOUNTER — Ambulatory Visit (INDEPENDENT_AMBULATORY_CARE_PROVIDER_SITE_OTHER)
Admission: RE | Admit: 2019-08-04 | Discharge: 2019-08-04 | Disposition: A | Discharge status: Home / Self Care | Payer: 59 | Source: Ambulatory Visit | Attending: Internal Medicine | Admitting: Internal Medicine

## 2019-08-04 VITALS — BP 130/82 | HR 95 | Temp 98.2°F | Ht 64.0 in | Wt 327.0 lb

## 2019-08-04 DIAGNOSIS — E538 Deficiency of other specified B group vitamins: Secondary | ICD-10-CM

## 2019-08-04 DIAGNOSIS — M79671 Pain in right foot: Secondary | ICD-10-CM

## 2019-08-04 DIAGNOSIS — R0600 Dyspnea, unspecified: Secondary | ICD-10-CM

## 2019-08-04 DIAGNOSIS — R0609 Other forms of dyspnea: Secondary | ICD-10-CM

## 2019-08-04 DIAGNOSIS — I2699 Other pulmonary embolism without acute cor pulmonale: Secondary | ICD-10-CM

## 2019-08-04 MED ORDER — VITAMIN B-12 1000 MCG SL SUBL: 1.0000 | SUBLINGUAL_TABLET | Freq: Every day | SUBLINGUAL | 3 refills | Status: AC

## 2019-08-04 MED ORDER — CYANOCOBALAMIN 1000 MCG/ML IJ SOLN
1000.0000 ug | Freq: Once | INTRAMUSCULAR | Status: AC
  Administered 2019-08-04: 1000 ug via SUBCUTANEOUS

## 2019-08-04 NOTE — Assessment & Plan Note (Signed)
Inj today B12 SL

## 2019-08-04 NOTE — Assessment & Plan Note (Signed)
Risks of bleeding discussed

## 2019-08-04 NOTE — Assessment & Plan Note (Signed)
Contusion/hematoma X ray Warm compress ACE wrap Arnica cream

## 2019-08-04 NOTE — Progress Notes (Signed)
Subjective:  Patient ID: Bridget [name], female    DOB: [birth year]  Age: 44 y.o. MRN: [id number]  CC: No chief complaint on file.   HPI Bridget [name] presents for R foot pain and swelling - a chair w/her 44 yo dtr fell on the R foot. Not better.  F/u DOE, DVT f/u - no change  Outpatient Medications Prior to Visit  Medication Sig Dispense Refill  . albuterol (VENTOLIN HFA) 108 (90 Base) MCG/ACT inhaler Inhale 1-2 puffs into the lungs every 6 (six) hours as needed for wheezing or shortness of breath. 18 g 1  . budesonide-formoterol (SYMBICORT) 160-4.5 MCG/ACT inhaler Inhale 2 puffs into the lungs 2 (two) times daily. 3 Inhaler 3  . busPIRone (BUSPAR) 10 MG tablet Take 1 tablet (10 mg total) by mouth 2 (two) times daily. 60 tablet 5  . cetirizine (ZYRTEC) 10 MG tablet Take 10 mg by mouth daily as needed for allergies.    . Cholecalciferol (VITAMIN D3) 1.25 MG (50000 UT) CAPS Take 1 capsule by mouth once a week. 12 capsule 3  . Cyanocobalamin (VITAMIN B-12) 1000 MCG SUBL Place 1 tablet (1,000 mcg total) under the tongue daily. 100 tablet 3  . escitalopram (LEXAPRO) 20 MG tablet Take 1 tablet (20 mg total) by mouth daily. 90 tablet 1  . meclizine (ANTIVERT) 25 MG tablet Take 1 tablet (25 mg total) by mouth 3 (three) times daily as needed for dizziness. 30 tablet 0  . Multiple Vitamin (MULTIVITAMIN) capsule Take 1 capsule by mouth daily.    . pantoprazole (PROTONIX) 40 MG tablet Take 1 tablet (40 mg total) by mouth daily. 30 tablet 3  . potassium chloride SA (KLOR-CON) 20 MEQ tablet Take 1 tablet (20 mEq total) by mouth daily. 90 tablet 3  . rivaroxaban (XARELTO) 20 MG TABS tablet Take 1 tablet (20 mg total) by mouth daily with supper. 30 tablet 5  . triamterene-hydrochlorothiazide (MAXZIDE-25) 37.5-25 MG tablet Take 1 tablet by mouth daily. Overdue for annual appt must see MD for future refills 90 tablet 3   No facility-administered medications prior to visit.    ROS: Review of  Systems  Constitutional: Negative for activity change, appetite change, chills, fatigue and unexpected weight change.  HENT: Negative for congestion, mouth sores and sinus pressure.   Eyes: Negative for visual disturbance.  Respiratory: Negative for cough and chest tightness.   Cardiovascular: Positive for leg swelling.  Gastrointestinal: Negative for abdominal pain and nausea.  Genitourinary: Negative for difficulty urinating, frequency and vaginal pain.  Musculoskeletal: Positive for gait problem. Negative for back pain.  Skin: Positive for color change. Negative for pallor and rash.  Neurological: Negative for dizziness, tremors, weakness, numbness and headaches.  Psychiatric/Behavioral: Negative for confusion and sleep disturbance.    Objective:  BP 130/82 (BP Location: Left Arm, Patient Position: Sitting, Cuff Size: Large)   Pulse 95   Temp 98.2 F (36.8 C) (Oral)   Ht 5\' 4"  (1.626 m)   Wt (!) 327 lb (148.3 kg)   LMP 07/09/2019   SpO2 95%   BMI 56.13 kg/m   BP Readings from Last 3 Encounters:  08/04/19 130/82  07/14/19 107/69  07/13/19 130/88    Wt Readings from Last 3 Encounters:  08/04/19 (!) 327 lb (148.3 kg)  07/14/19 (!) 330 lb (149.7 kg)  07/13/19 (!) 331 lb (150.1 kg)    Physical Exam Constitutional:      General: She is not in acute distress.    Appearance: She  is well-developed.  HENT:     Head: Normocephalic.     Right Ear: External ear normal.     Left Ear: External ear normal.     Nose: Nose normal.  Eyes:     General:        Right eye: No discharge.        Left eye: No discharge.     Conjunctiva/sclera: Conjunctivae normal.     Pupils: Pupils are equal, round, and reactive to light.  Neck:     Thyroid: No thyromegaly.     Vascular: No JVD.     Trachea: No tracheal deviation.  Cardiovascular:     Rate and Rhythm: Normal rate and regular rhythm.     Heart sounds: Normal heart sounds.  Pulmonary:     Effort: No respiratory distress.      Breath sounds: No stridor. No wheezing.  Abdominal:     General: Bowel sounds are normal. There is no distension.     Palpations: Abdomen is soft. There is no mass.     Tenderness: There is no abdominal tenderness. There is no guarding or rebound.  Musculoskeletal:        General: Swelling and tenderness present. No deformity.     Cervical back: Normal range of motion and neck supple.     Right lower leg: Edema present.  Lymphadenopathy:     Cervical: No cervical adenopathy.  Skin:    Findings: No erythema or rash.  Neurological:     Mental Status: She is oriented to person, place, and time.     Cranial Nerves: No cranial nerve deficit.     Motor: No abnormal muscle tone.     Coordination: Coordination normal.     Deep Tendon Reflexes: Reflexes normal.  Psychiatric:        Behavior: Behavior normal.        Thought Content: Thought content normal.        Judgment: Judgment normal.    R dorsal foot is purple, swollen, tender   Lab Results  Component Value Date   WBC 8.7 07/14/2019   HGB 11.1 (L) 07/14/2019   HCT 34.7 (L) 07/14/2019   PLT 412 (H) 07/14/2019   GLUCOSE 109 (H) 07/14/2019   CHOL 153 04/21/2018   TRIG 88.0 04/21/2018   HDL 39.40 04/21/2018   LDLCALC 96 04/21/2018   ALT 11 07/14/2019   AST 18 07/14/2019   NA 137 07/14/2019   K 3.4 (L) 07/14/2019   CL 102 07/14/2019   CREATININE 0.57 07/14/2019   BUN 9 07/14/2019   CO2 25 07/14/2019   TSH 0.88 07/13/2019   HGBA1C 5.3 10/29/2017    DG Chest 2 View  Result Date: 07/14/2019 CLINICAL DATA:  Patient with shortness of breath and chest pain. EXAM: CHEST - 2 VIEW COMPARISON:  Chest radiograph 10/18/2018 FINDINGS: Stable cardiac and mediastinal contours. No consolidative pulmonary opacities. No pleural effusion or pneumothorax. Thoracic spine degenerative changes. IMPRESSION: No acute cardiopulmonary process. Electronically Signed   By: Lovey Newcomer M.D.   On: 07/14/2019 18:22    Assessment & Plan:   Diagnoses  and all orders for this visit:  B12 deficiency -     cyanocobalamin ((VITAMIN B-12)) injection 1,000 mcg     Meds ordered this encounter  Medications  . cyanocobalamin ((VITAMIN B-12)) injection 1,000 mcg     Follow-up: No follow-ups on file.  Walker Kehr, MD

## 2019-08-04 NOTE — Patient Instructions (Addendum)
Warm compress ACE wrap Arnica cream   Hematoma A hematoma is a collection of blood under the skin, in an organ, in a body space, in a joint space, or in other tissue. The blood can thicken (clot) to form a lump that you can see and feel. The lump is often firm and may become sore and tender. Most hematomas get better in a few days to weeks. However, some hematomas may be serious and require medical care. Hematomas can range from very small to very large. What are the causes? This condition is caused by:  A blunt or penetrating injury.  A leakage from a blood vessel under the skin.  Some medical procedures, including surgeries, such as oral surgery, face lifts, and surgeries on the joints.  Some medical conditions that cause bleeding or bruising. There may be multiple hematomas that appear in different areas of the body. What increases the risk? You are more likely to develop this condition if:  You are an older adult.  You use blood thinners. What are the signs or symptoms?  Symptoms of this condition depend on where the hematoma is located.  Common symptoms of a hematoma that is under the skin include:  A firm lump on the body.  Pain and tenderness in the area.  Bruising. Blue, dark blue, purple-red, or yellowish skin (discoloration) may appear at the site of the hematoma if the hematoma is close to the surface of the skin. Common symptoms of a hematoma that is deep in the tissues or body spaces may be less obvious. They include:  A collection of blood in the stomach (intra-abdominal hematoma). This may cause pain in the abdomen, weakness, fainting, and shortness of breath.  A collection of blood in the head (intracranial hematoma). This may cause a headache or symptoms such as weakness, trouble speaking or understanding, or a change in consciousness. How is this diagnosed? This condition is diagnosed based on:  Your medical history.  A physical exam.  Imaging tests,  such as an ultrasound or CT scan. These may be needed if your health care provider suspects a hematoma in deeper tissues or body spaces.  Blood tests. These may be needed if your health care provider believes that the hematoma is caused by a medical condition. How is this treated? Treatment for this condition depends on the cause, size, and location of the hematoma. Treatment may include:  Doing nothing. The majority of hematomas do not need treatment as many of them go away on their own over time.  Surgery or close monitoring. This may be needed for large hematomas or hematomas that affect vital organs.  Medicines. Medicines may be given if there is an underlying medical cause for the hematoma. Follow these instructions at home: Managing pain, stiffness, and swelling   If directed, put ice on the affected area. ? Put ice in a plastic bag. ? Place a towel between your skin and the bag. ? Leave the ice on for 20 minutes, 2-3 times a day for the first couple of days.  If directed, apply heat to the affected area after applying ice for a couple of days. Use the heat source that your health care provider recommends, such as a moist heat pack or a heating pad. ? Place a towel between your skin and the heat source. ? Leave the heat on for 20-30 minutes. ? Remove the heat if your skin turns bright red. This is especially important if you are unable to feel pain,  heat, or cold. You may have a greater risk of getting burned.  Raise (elevate) the affected area above the level of your heart while you are sitting or lying down.  If told, wrap the affected area with an elastic bandage. The bandage applies pressure (compression) to the area, which may help to reduce swelling and promote healing. Do not wrap the bandage too tightly around the affected area.  If your hematoma is on a leg or foot (lower extremity) and is painful, your health care provider may recommend crutches. Use them as told by your  health care provider. General instructions  Take over-the-counter and prescription medicines only as told by your health care provider.  Keep all follow-up visits as told by your health care provider. This is important. Contact a health care provider if:  You have a fever.  The swelling or discoloration gets worse.  You develop more hematomas. Get help right away if:  Your pain is worse or your pain is not controlled with medicine.  Your skin over the hematoma breaks or starts bleeding.  Your hematoma is in your chest or abdomen and you have weakness, shortness of breath, or a change in consciousness.  You have a hematoma on your scalp that is caused by a fall or injury, and you also have: ? A headache that gets worse. ? Trouble speaking or understanding speech. ? Weakness. ? Change in alertness or consciousness. Summary  A hematoma is a collection of blood under the skin, in an organ, in a body space, in a joint space, or in other tissue.  This condition usually does not need treatment because many hematomas go away on their own over time.  Large hematomas, or those that may affect vital organs, may need surgical drainage or monitoring. If the hematoma is caused by a medical condition, medicines may be prescribed.  Get help right away if your hematoma breaks or starts to bleed, you have shortness of breath, or you have a headache or trouble speaking after a fall. This information is not intended to replace advice given to you by your health care provider. Make sure you discuss any questions you have with your health care provider. Document Revised: 07/29/2018 Document Reviewed: 08/07/2017 Elsevier Patient Education  2020 [employer].

## 2019-08-04 NOTE — Assessment & Plan Note (Signed)
No change 

## 2019-08-13 ENCOUNTER — Telehealth: Payer: Self-pay | Admitting: Internal Medicine

## 2019-08-13 NOTE — Telephone Encounter (Signed)
New message:    Pt is calling and states her foot is still the same as it was the last time. She states the bruising and swelling is still getting worse. She states its still red on top of her foot. Pt states she thinks it may be getting infected. She also states she thinks it may be cellulitis . Pt states if another Dr can advise her she would appreciate it. Please advise.

## 2019-08-13 NOTE — Telephone Encounter (Signed)
Would recommend appt today to evaluate - I think Dr Sharlet Salina has an opening this afternoon

## 2019-08-13 NOTE — Telephone Encounter (Signed)
MD is pit of the office pls advise on msg below.Marland KitchenJohny [name]

## 2019-08-13 NOTE — Telephone Encounter (Signed)
Notified pt w/Md response. Pt states she is actual at work and not able to come in. She states she has been dealing w/this for about a month now. She states the xray was ok.. no fracture but she feels that her foot in infected. Inform pt I will route the msg to Dr. Alain Marion since he saw her foot last, and know whats going on. Did inform pt if sxs get worse or she develop fever to seek care at urgent care or ER...Johny Chess

## 2019-08-13 NOTE — Telephone Encounter (Signed)
I agree.  She needs to be seen this afternoon.  Thank you

## 2019-08-18 NOTE — Progress Notes (Signed)
HEMATOLOGY/ONCOLOGY CONSULTATION NOTE  Date of Service: 08/19/2019  Patient Care Team: Plotnikov, Evie Lacks, MD as PCP - General  REFERRING PHYSICIAN: Plotnikov, Evie Lacks, MD  CHIEF COMPLAINTS/PURPOSE OF CONSULTATION:  Other Acute Pulmonary Embolism without Acute Cor Pulmonale   HISTORY OF PRESENTING ILLNESS:   Bridget [name] is a wonderful 44 y.o. female who has been referred to [place] by Plotnikov, Evie Lacks, MD for evaluation and management of other acute pulmonary embolism without acute cor pulmonale. The pt reports that she is doing well overall.   The pt reports she is good.  She has not had concerns with diabetes or hypertension. Pt was placed on Triamterene for fluid retention and has been on this for years. Her left long usually stays more swollen than the right leg.   Back in April of 2020 the pt had a PE and got a CT scan where the pulmonologist did not see a clot in the lungs or changes in the lungs. Her symptoms of SOB came on quickly and she had palpitations as well.  Pt reports she was told that the blood clot could have been missed due to being on Xarelto. At the time her D-dimer was high and she was having SOB so her PCP placed her on Xarelto for 6 months. Pt does use albuterol inhaler and symbicort. She does not have asthma and has not had a lung function test. Overall, Pt does not attribute the SOB to anxiety.   While on the Xarelto the pt did not have any SOB but once she was taken off her SOB came back within 1 day. Patient reports PCP plans to revisit Xarelto use after her bariatric surgery in October. The SOB was both at rest and while walking. She did not keep pt from walking and was not limiting to her. Pt has not done lung function testing, venous competence studies, been exposed to 2nd hand smoke and has no concern for heart disease. She has not been on birth control or oral contraceptives. Currently pt has a hematoma on the right foot.   Of note prior to the  patient's visit today, pt has had DG Chest 2 View ([id number]) completed on 07/14/19 with results revealing "No acute cardiopulmonary process." Pt has had DG Foot Complete Right ([id number]) completed on 08/04/19 with results revealing "Diffuse soft tissue swelling. No radiopaque foreign body. No acute bony abnormality identified."  Most recent lab results (07/14/19) of CBC is as follows: all values are WNL except for Hemoglobin at 11.1, HCT at 34.7, Platelets at 412K, Potassium at 3.4, Glucose at 109, Calcium at 8.8 07/14/19 of Urinalysis, Routine w Reflex Microscopic is as follows: all values are WNL except for [facility] 07/14/19 of Pregnancy, Urine at Negative 07/14/19 of Lipase, blood at 26: WNL 07/14/19 of Troponin I (high sensitivity) at 3: WNL  On review of systems, pt denies SOB, fever, chills, night sweats, changes in bowl habits, abdominal pain, back pain, and any other symptoms.   On PMHx the pt reports PE in April of 2020 (though not confirmed on CTA chest - only had borderline d dimer elevation and some SOB) , scoliosis   On Social Hx the pt reports never smoked, social alcohol use  On Family Hx the pt reports PE of cousin at 46 years old, DVT of cousin, Father had DVT   MEDICAL HISTORY:  Past Medical History:  Diagnosis Date  . Constipation   . Edema   . Headache(784.0)   .  Morbid obesity ([place])   . Pulmonary embolism ([place])      SURGICAL HISTORY: Past Surgical History:  Procedure Laterality Date  . BREAST REDUCTION SURGERY    . BREAST SURGERY  2005   reduction  . CESAREAN SECTION N/A 03/20/2016   Procedure: CESAREAN SECTION;  Surgeon: Christophe Louis, MD;  Location: [facility];  Service: Obstetrics;  Laterality: N/A;  . TUBAL LIGATION Bilateral 03/20/2016   Procedure: BILATERAL TUBAL LIGATION;  Surgeon: Christophe Louis, MD;  Location: [facility];  Service: Obstetrics;  Laterality: Bilateral;     SOCIAL HISTORY: Social History   Socioeconomic History   . Marital status: Married    Spouse name: Not on file  . Number of children: 1  . Years of education: Not on file  . Highest education level: Not on file  Occupational History    Employer: [employer]  Tobacco Use  . Smoking status: Never Smoker  . Smokeless tobacco: Never Used  Substance and Sexual Activity  . Alcohol use: Yes    Comment: occasionally  . Drug use: No  . Sexual activity: Yes    Birth control/protection: None  Other Topics Concern  . Not on file  Social History Narrative  . Not on file   Social Determinants of Health   Financial Resource Strain:   . Difficulty of Paying Living Expenses:   Food Insecurity:   . Worried About Charity fundraiser in the Last Year:   . Arboriculturist in the Last Year:   Transportation Needs:   . Film/video editor (Medical):   Marland Kitchen Lack of Transportation (Non-Medical):   Physical Activity:   . Days of Exercise per Week:   . Minutes of Exercise per Session:   Stress:   . Feeling of Stress :   Social Connections:   . Frequency of Communication with Friends and Family:   . Frequency of Social Gatherings with Friends and Family:   . Attends Religious Services:   . Active Member of Clubs or Organizations:   . Attends Archivist Meetings:   Marland Kitchen Marital Status:   Intimate Partner Violence:   . Fear of Current or Ex-Partner:   . Emotionally Abused:   Marland Kitchen Physically Abused:   . Sexually Abused:      FAMILY HISTORY: Family History  Problem Relation Age of Onset  . Deep vein thrombosis Father   . Diabetes Father   . Hypertension Mother   . Hypertension Other   . Diabetes Paternal Aunt   . Heart disease Maternal Grandmother   . Heart disease Maternal Grandfather   . Diabetes Paternal Grandmother      ALLERGIES:   has No Known Allergies.   MEDICATIONS:  Current Outpatient Medications  Medication Sig Dispense Refill  . albuterol (VENTOLIN HFA) 108 (90 Base) MCG/ACT inhaler Inhale 1-2 puffs into the  lungs every 6 (six) hours as needed for wheezing or shortness of breath. 18 g 1  . budesonide-formoterol (SYMBICORT) 160-4.5 MCG/ACT inhaler Inhale 2 puffs into the lungs 2 (two) times daily. 3 Inhaler 3  . busPIRone (BUSPAR) 10 MG tablet Take 1 tablet (10 mg total) by mouth 2 (two) times daily. 60 tablet 5  . cetirizine (ZYRTEC) 10 MG tablet Take 10 mg by mouth daily as needed for allergies.    . Cholecalciferol (VITAMIN D3) 1.25 MG (50000 UT) CAPS Take 1 capsule by mouth once a week. 12 capsule 3  . Cyanocobalamin (VITAMIN B-12) 1000 MCG SUBL Place  1 tablet (1,000 mcg total) under the tongue daily. 100 tablet 3  . escitalopram (LEXAPRO) 20 MG tablet Take 1 tablet (20 mg total) by mouth daily. 90 tablet 1  . meclizine (ANTIVERT) 25 MG tablet Take 1 tablet (25 mg total) by mouth 3 (three) times daily as needed for dizziness. 30 tablet 0  . Multiple Vitamin (MULTIVITAMIN) capsule Take 1 capsule by mouth daily.    . pantoprazole (PROTONIX) 40 MG tablet Take 1 tablet (40 mg total) by mouth daily. 30 tablet 3  . potassium chloride SA (KLOR-CON) 20 MEQ tablet Take 1 tablet (20 mEq total) by mouth daily. 90 tablet 3  . rivaroxaban (XARELTO) 20 MG TABS tablet Take 1 tablet (20 mg total) by mouth daily with supper. 30 tablet 5  . triamterene-hydrochlorothiazide (MAXZIDE-25) 37.5-25 MG tablet TAKE 1 TABLET BY MOUTH ONCE DAILY . APPOINTMENT REQUIRED FOR FUTURE REFILLS 30 tablet 0   No current facility-administered medications for this visit.     REVIEW OF SYSTEMS:   A 10+ POINT REVIEW OF SYSTEMS WAS OBTAINED including neurology, dermatology, psychiatry, cardiac, respiratory, lymph, extremities, GI, GU, Musculoskeletal, constitutional, breasts, reproductive, HEENT.  All pertinent positives are noted in the HPI.  All others are negative.   PHYSICAL EXAMINATION: ECOG PERFORMANCE STATUS: 1 - Symptomatic but completely ambulatory  Vitals:   08/19/19 1131  BP: (!) 148/87  Pulse: 85  Resp: 18  Temp: 98.1  F (36.7 C)  SpO2: 98%   Filed Weights   08/19/19 1131  Weight: (!) 332 lb 11.2 oz (150.9 kg)   Body mass index is 57.11 kg/m.  GENERAL:alert, in no acute distress and comfortable SKIN: no acute rashes, no significant lesions EYES: conjunctiva are pink and non-injected, sclera anicteric OROPHARYNX: MMM, no exudates, no oropharyngeal erythema or ulceration NECK: supple, no JVD LYMPH:  no palpable lymphadenopathy in the cervical, axillary or inguinal regions LUNGS: clear to auscultation b/l with normal respiratory effort HEART: regular rate & rhythm ABDOMEN:  normoactive bowel sounds , non tender, not distended. Extremity: no pedal edema PSYCH: alert & oriented x 3 with fluent speech NEURO: no focal motor/sensory deficits  LABORATORY DATA:  I have reviewed the data as listed  CBC Latest Ref Rng & Units 07/14/2019 07/13/2019 06/30/2018  WBC 4.0 - 10.5 K/uL 8.7 7.0 8.1  Hemoglobin 12.0 - 15.0 g/dL 11.1(L) 10.7(L) 14.1  Hematocrit 36.0 - 46.0 % 34.7(L) 32.2(L) 41.4  Platelets 150 - 400 K/uL 412(H) 376.0 353.0    CMP Latest Ref Rng & Units 07/14/2019 07/13/2019 07/13/2018  Glucose 70 - 99 mg/dL 109(H) 99 102(H)  BUN 6 - 20 mg/dL 9 12 12   Creatinine 0.44 - 1.00 mg/dL 0.57 0.66 0.75  Sodium 135 - 145 mmol/L 137 138 140  Potassium 3.5 - 5.1 mmol/L 3.4(L) 3.4(L) 3.3(L)  Chloride 98 - 111 mmol/L 102 101 102  CO2 22 - 32 mmol/L 25 28 26   Calcium 8.9 - 10.3 mg/dL 8.8(L) 8.7 9.5  Total Protein 6.5 - 8.1 g/dL 7.4 - 7.3  Total Bilirubin 0.3 - 1.2 mg/dL 0.6 - 1.3(H)  Alkaline Phos 38 - 126 U/L 46 - 41  AST 15 - 41 U/L 18 - 12  ALT 0 - 44 U/L 11 - 8      RADIOGRAPHIC STUDIES: I have personally reviewed the radiological images as listed and agreed with the findings in the report. DG Foot Complete Right  Result Date: 08/05/2019 CLINICAL DATA:  Right foot pain.  Injury. EXAM: RIGHT FOOT COMPLETE - 3+  VIEW COMPARISON:  No recent prior. FINDINGS: Diffuse soft tissue swelling. No radiopaque  foreign body. No acute bony or joint abnormality identified. No evidence of fracture dislocation. IMPRESSION: Diffuse soft tissue swelling. No radiopaque foreign body. No acute bony abnormality identified. Electronically Signed   By: Marcello Moores  Register   On: 08/05/2019 08:26   CT ANGIOGRAPHY CHEST WITH CONTRAST (07/01/2019)  TECHNIQUE: Multidetector CT imaging of the chest was performed using the standard protocol during bolus administration of intravenous contrast. Multiplanar CT image reconstructions and MIPs were obtained to evaluate the vascular anatomy.  CONTRAST:  67mL OMNIPAQUE IOHEXOL 350 MG/ML SOLN  COMPARISON:  Chest radiographs 06/30/2018  FINDINGS: Cardiovascular: Pulmonary arterial opacification is adequate to the segmental level without evidence of emboli. The thoracic aorta is normal in caliber. The heart is normal in size. There is no pericardial effusion.  Mediastinum/Nodes: No enlarged axillary, mediastinal, or hilar lymph nodes. Grossly unremarkable esophagus and included portion of the thyroid.  Lungs/Pleura: No pleural effusion or pneumothorax. Mild respiratory motion artifact. No lung consolidation or mass.  Upper Abdomen: Unremarkable.  Musculoskeletal: Moderate thoracic dextroscoliosis.  Review of the MIP images confirms the above findings.  IMPRESSION: No evidence of pulmonary emboli or other acute abnormality in the chest.   Electronically Signed   By: Logan Bores M.D.   [place] ext : ( 07/07/2019)  Summary:    Right:  No evidence of thrombosis in the subclavian.    Left:  No evidence of deep vein thrombosis in the upper extremity. No evidence of  superficial vein thrombosis in the upper extremity.     *See table(s) above for measurements and observations.    Diagnosing physician: Ena Dawley MD    [place] lower extremities venous 08/05/2019  Summary:    Right: No evidence of deep vein thrombosis in the lower extremity. No   indirect evidence of obstruction proximal to the inguinal ligament. No  cystic structure found in the popliteal fossa.   Left: No evidence of deep vein thrombosis in the lower extremity. No  indirect evidence of obstruction proximal to the inguinal ligament. No  cystic structure found in the popliteal fossa.    *See table(s) above for measurements and observations.   Electronically signed by Carlyle Dolly MD on 08/06/2018 at 9:51:15 AM.   ASSESSMENT & PLAN:   NASHAY CART is a 44 y.o. female with:  1. ? Hx of Pulmonary embolism 2. Morbid obesity 3.Fhx of VTE  PLAN: -Discussed patient's most recent labs from 07/14/19,  of CBC is as follows: all values are WNL except for Hemoglobin at 11.1, HCT at 34.7, Platelets at 412K, Potassium at 3.4, Glucose at 109, Calcium at 8.8 -Advised on right foot hematoma from trauma -keep elevated and wrapped  -Advised on past CTA chest in 06/2018 and absence of PE and [place] with no evidence of DVT -Advised on D-dimer being high could be from several causes include reasons other than VTE. -Advised on leg swelling- treatment leg elevation and compression socks  -Advised does not have documented blood clot -Advised being placed back on Xarelto and SOB resolving could be placebo or anxiety  -Advised on restricted lung disease, from scoliosis or body habitus. -Advised on additional causes from SOB and recommend patient f/u with PCP/Pulmonary for further evaluation -Advised on risks of blood clots -family has had no unprovoked event, pt has had previous surgeries -Advised unlikely significant inherited clotting disorder   -Advised on other risk factors- bariatric surgery, venous incompetence, body weight  -Advised  on baby aspirin to reduce risk of clots or preventive dose of Xarelto -- though no clear guideline in the absence of any demonstrated VTE events. -Recommend complete pulmonary workup with PCP/pulmonary to r/o other causes of SOB. -Recommends  staying hydrated  -Recommends using sports graded compression socks and keeping legs elevated   FOLLOW UP RTC with Dr Irene Limbo as needed   The total time spent in the appt was 45 minutes and more than 50% was on counseling and direct patient cares.  All of the patient's questions were answered with apparent satisfaction. The patient knows to call the clinic with any problems, questions or concerns.  Sullivan Lone MD MS AAHIVMS [facility] Hematology/Oncology Physician [facility]  (Office):       [phone] (Work cell):  [phone] (Fax):           [phone]  08/19/2019 2:00 PM  I, Dawayne Cirri am acting as a Education administrator for Dr. Sullivan Lone.   .I have reviewed the above documentation for accuracy and completeness, and I agree with the above. Brunetta Genera MD

## 2019-08-19 ENCOUNTER — Inpatient Hospital Stay: Payer: 59 | Attending: Hematology | Admitting: Hematology

## 2019-08-19 ENCOUNTER — Ambulatory Visit (INDEPENDENT_AMBULATORY_CARE_PROVIDER_SITE_OTHER): Payer: 59 | Admitting: Internal Medicine

## 2019-08-19 ENCOUNTER — Other Ambulatory Visit: Payer: Self-pay

## 2019-08-19 ENCOUNTER — Encounter: Payer: Self-pay | Admitting: Hematology

## 2019-08-19 ENCOUNTER — Telehealth: Payer: Self-pay | Admitting: Internal Medicine

## 2019-08-19 ENCOUNTER — Other Ambulatory Visit: Payer: Self-pay | Admitting: Internal Medicine

## 2019-08-19 ENCOUNTER — Encounter: Payer: Self-pay | Admitting: Internal Medicine

## 2019-08-19 VITALS — BP 148/87 | HR 85 | Temp 98.1°F | Resp 18 | Ht 64.0 in | Wt 332.7 lb

## 2019-08-19 DIAGNOSIS — R7989 Other specified abnormal findings of blood chemistry: Secondary | ICD-10-CM | POA: Insufficient documentation

## 2019-08-19 DIAGNOSIS — M79671 Pain in right foot: Secondary | ICD-10-CM

## 2019-08-19 DIAGNOSIS — Z7982 Long term (current) use of aspirin: Secondary | ICD-10-CM

## 2019-08-19 DIAGNOSIS — R0602 Shortness of breath: Secondary | ICD-10-CM | POA: Diagnosis not present

## 2019-08-19 DIAGNOSIS — S9031XA Contusion of right foot, initial encounter: Secondary | ICD-10-CM | POA: Diagnosis not present

## 2019-08-19 DIAGNOSIS — Z832 Family history of diseases of the blood and blood-forming organs and certain disorders involving the immune mechanism: Secondary | ICD-10-CM | POA: Insufficient documentation

## 2019-08-19 DIAGNOSIS — Z86711 Personal history of pulmonary embolism: Secondary | ICD-10-CM

## 2019-08-19 MED ORDER — SULFAMETHOXAZOLE-TRIMETHOPRIM 800-160 MG PO TABS: 1.0000 | ORAL_TABLET | Freq: Two times a day (BID) | ORAL | 0 refills | Status: DC

## 2019-08-19 MED ORDER — DICLOFENAC SODIUM 1 % EX GEL: 2.0000 g | Freq: Four times a day (QID) | CUTANEOUS | 0 refills | Status: DC

## 2019-08-19 NOTE — Patient Instructions (Signed)
We have sent in bactrim to take 1 pill twice a day for 5 days.   We have also sent in the voltaren gel to use on it up to 4 times a day.   There likely is a hematoma on the foot which can take weeks to months to go fully away.  If no better in 3-4 days let us know we can try the oral prednisone.

## 2019-08-19 NOTE — Telephone Encounter (Signed)
Refill sent earlier today. See meds.

## 2019-08-19 NOTE — Telephone Encounter (Signed)
New message:    1.Medication Requested: triamterene-hydrochlorothiazide (MAXZIDE-25) 37.5-25 MG tablet 2. Pharmacy (Name, Street, [place]): [facility], [address] 3. On Med List: yes  4. Last Visit with PCP: 08/04/19  5. Next visit date with PCP: None   Agent: Please be advised that RX refills may take up to 3 business days. We ask that you follow-up with your pharmacy.

## 2019-08-19 NOTE — Assessment & Plan Note (Signed)
Suspect hematoma on the surface of the foot, discussed that the swelling from hematoma can take weeks to months to resolve and may be delayed by her taking xarelto and was more likely to happen due to this. Rx voltaren gel for pain. Rx bactrim for early cellulitis on the foot. Try to elevate and ice foot as needed.

## 2019-08-19 NOTE — Progress Notes (Signed)
   Subjective:   Patient ID: Bridget [name], female    DOB: [birth year], 44 y.o.   MRN: [id number]  HPI The patient is a 44 YO female coming in for swollen and painful foot. Chair fell on this about 1 month ago and has had pain and swelling ever since. Called about 5 days ago with new redness Had visit with hematology this morning (note not done) but does not appear labs taken or meds changed. 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 Takes HCTZ daily and denies missing doses of this. She has noticed more redness and swelling in the last 5 days. Does work from home and legs dangle throughout the day. She has tried otc cream and tylenol for pain without relief. Overall it is not improving and she felt it should be getting better by now.  Review of Systems  Constitutional: Negative.   HENT: Negative.   Eyes: Negative.   Respiratory: Negative for cough, chest tightness and shortness of breath.   Cardiovascular: Negative for chest pain, palpitations and leg swelling.  Gastrointestinal: Negative for abdominal distention, abdominal pain, constipation, diarrhea, nausea and vomiting.  Musculoskeletal: Positive for arthralgias, joint swelling and myalgias.  Skin: Negative.   Neurological: Negative.   Psychiatric/Behavioral: Negative.     Objective:  Physical Exam Constitutional:      Appearance: She is well-developed.  HENT:     Head: Normocephalic and atraumatic.  Cardiovascular:     Rate and Rhythm: Normal rate and regular rhythm.  Pulmonary:     Effort: Pulmonary effort is normal. No respiratory distress.     Breath sounds: Normal breath sounds. No wheezing or rales.  Abdominal:     General: Bowel sounds are normal. There is no distension.     Palpations: Abdomen is soft.     Tenderness: There is no abdominal tenderness. There is no rebound.  Musculoskeletal:        General: Tenderness present.     Cervical back: Normal range of motion.     Comments:  Swelling and redness on the surface right foot. With appearance of hematoma likely about 2 cm by 1.5 cm circular and firm to touch. Swelling along the foot and into the ankle.   Skin:    General: Skin is warm and dry.  Neurological:     Mental Status: She is alert and oriented to person, place, and time.     Coordination: Coordination normal.     Vitals:   08/19/19 1504  BP: (!) 142/80  Pulse: 78  Temp: 99.1 F (37.3 C)  SpO2: 97%  Weight: (!) 334 lb 3.2 oz (151.6 kg)  Height: 5\' 4"  (1.626 m)    This visit occurred during the SARS-CoV-2 public health emergency.  Safety protocols were in place, including screening questions prior to the visit, additional usage of staff PPE, and extensive cleaning of exam room while observing appropriate contact time as indicated for disinfecting solutions.   Assessment & Plan:

## 2019-09-06 DIAGNOSIS — Z7901 Long term (current) use of anticoagulants: Secondary | ICD-10-CM | POA: Insufficient documentation

## 2019-09-24 ENCOUNTER — Other Ambulatory Visit: Payer: Self-pay | Admitting: Internal Medicine

## 2019-09-26 ENCOUNTER — Emergency Department (HOSPITAL_COMMUNITY)
Admission: EM | Admit: 2019-09-26 | Discharge: 2019-09-27 | Disposition: A | Discharge status: Home / Self Care | Payer: 59 | Attending: Emergency Medicine | Admitting: Emergency Medicine

## 2019-09-26 ENCOUNTER — Encounter (HOSPITAL_COMMUNITY): Payer: Self-pay | Admitting: Emergency Medicine

## 2019-09-26 ENCOUNTER — Other Ambulatory Visit: Payer: Self-pay

## 2019-09-26 ENCOUNTER — Emergency Department (HOSPITAL_COMMUNITY): Payer: 59

## 2019-09-26 DIAGNOSIS — I10 Essential (primary) hypertension: Secondary | ICD-10-CM | POA: Diagnosis not present

## 2019-09-26 DIAGNOSIS — R002 Palpitations: Secondary | ICD-10-CM

## 2019-09-26 DIAGNOSIS — Z79899 Other long term (current) drug therapy: Secondary | ICD-10-CM | POA: Diagnosis not present

## 2019-09-26 DIAGNOSIS — R0789 Other chest pain: Secondary | ICD-10-CM | POA: Diagnosis not present

## 2019-09-26 DIAGNOSIS — R079 Chest pain, unspecified: Secondary | ICD-10-CM

## 2019-09-26 MED ORDER — SODIUM CHLORIDE 0.9% FLUSH: 3.0000 mL | Freq: Once | INTRAVENOUS | Status: DC

## 2019-09-26 NOTE — ED Triage Notes (Signed)
Pt presents to ED BIB [facility]. Pt c/o L sided chest pressure that radiates towards L arm, weakness, nausea, back pain and "heart racing." Pt reports that it began 1h ago at rest. Hx DTV.

## 2019-09-27 ENCOUNTER — Telehealth: Payer: Self-pay

## 2019-09-27 LAB — CBC
HCT: 34.8 % — ABNORMAL LOW (ref 36.0–46.0)
Hemoglobin: 10.9 g/dL — ABNORMAL LOW (ref 12.0–15.0)
MCH: 27.5 pg (ref 26.0–34.0)
MCHC: 31.3 g/dL (ref 30.0–36.0)
MCV: 87.7 fL (ref 80.0–100.0)
Platelets: 368 10*3/uL (ref 150–400)
RBC: 3.97 MIL/uL (ref 3.87–5.11)
RDW: 17.5 % — ABNORMAL HIGH (ref 11.5–15.5)
WBC: 9.6 10*3/uL (ref 4.0–10.5)
nRBC: 0 % (ref 0.0–0.2)

## 2019-09-27 LAB — I-STAT BETA HCG BLOOD, ED (MC, WL, AP ONLY): I-stat hCG, quantitative: <5 m[IU]/mL (ref <5)

## 2019-09-27 LAB — BASIC METABOLIC PANEL
Anion gap: 6 (ref 5–15)
BUN: 10 mg/dL (ref 6–20)
CO2: 30 mmol/L (ref 22–32)
Calcium: 8.9 mg/dL (ref 8.9–10.3)
Chloride: 104 mmol/L (ref 98–111)
Creatinine, Ser: 0.69 mg/dL (ref 0.44–1.00)
GFR calc Af Amer: >60 mL/min (ref >60)
GFR calc non Af Amer: >60 mL/min (ref >60)
Glucose, Bld: 101 mg/dL — ABNORMAL HIGH (ref 70–99)
Potassium: 3.8 mmol/L (ref 3.5–5.1)
Sodium: 140 mmol/L (ref 135–145)

## 2019-09-27 LAB — TROPONIN I (HIGH SENSITIVITY)
Troponin I (High Sensitivity): 3 ng/L (ref <18)
Troponin I (High Sensitivity): 3 ng/L (ref <18)

## 2019-09-27 NOTE — Discharge Instructions (Signed)
You were seen in the emergency department for racing heart chest pain shortness of breath.  You had blood work EKG and a chest x-ray that did not show any serious findings.  Please continue your regular medications.  Contact your primary care doctor to get set up with outpatient cardiac monitoring.  Return to the emergency department if any worsening or concerning symptoms.

## 2019-09-27 NOTE — Telephone Encounter (Signed)
New message    Per patient went by EMS last night to the hospital patient voiced she is not any better.   A cardiologist appt has not been set up yet.    Asking for guidance from the MD on the next steps on what she should do.  The next available for MD is  7.26.21

## 2019-09-27 NOTE — ED Provider Notes (Signed)
[facility] EMERGENCY DEPARTMENT Provider Note   CSN: [id number] Arrival date & time: 09/26/19  2325     History Chief Complaint  Patient presents with  . Chest Pain    Bridget [name] is a 44 y.o. female.  She is presenting here with acute onset of tachycardia and substernal chest pain that radiated into her left axilla associated with some shortness of breath that began last evening.  She said she felt like she might pass out.  She called EMS and it sounds like at that time they told her that everything looked okay and brought her here.  Her initial heart rate at triage was 85.  Pulse ox 100%.  She said she has had these episodes of tachycardia and chest pain and thinks she may need to see cardiologist.  She tells me she had an episode of shortness of breath and chest pain during the pandemic and she had a virtual visit with her primary care doctor who started her on Xarelto for possible PE.  She had a CAT scan 2 days later that did not show any evidence of PE.  She followed up with pulmonology.  She continues on Xarelto.  The history is provided by the patient.  Chest Pain Pain location:  Substernal area Pain quality: aching   Pain radiates to:  L shoulder Pain severity:  Moderate Onset quality:  Sudden Timing:  Constant Progression:  Partially resolved Chronicity:  Recurrent Context: at rest   Relieved by:  Nothing Worsened by:  Nothing Ineffective treatments:  None tried Associated symptoms: dizziness, near-syncope, palpitations and shortness of breath   Associated symptoms: no abdominal pain, no back pain, no cough, no diaphoresis, no fever, no headache, no nausea and no vomiting   Risk factors: hypertension and prior DVT/PE        Past Medical History:  Diagnosis Date  . Constipation   . Edema   . Headache(784.0)   . Morbid obesity ([place])   . Pulmonary embolism [facility])     Patient Active Problem List   Diagnosis Date Noted  . Foot pain, right  08/04/2019  . BPPV (benign paroxysmal positional vertigo) 12/04/2018  . Midsternal chest pain 09/16/2018  . Pulmonary embolism ([place]) 07/22/2018  . Anxiety 07/13/2018  . Arm swelling 07/06/2018  . DOE (dyspnea on exertion) 06/30/2018  . HTN (hypertension) 10/29/2017  . Reactive depression (situational) 10/29/2017  . Pruritic condition 09/25/2016  . Urticaria 09/25/2016  . Anemia 04/23/2016  . Term pregnancy 03/20/2016  . Well adult exam 05/06/2014  . Otitis media 12/13/2011  . Fatigue 12/13/2011  . B12 deficiency 12/13/2011  . Vitamin D deficiency 12/13/2011  . Palpitations 08/06/2010  . Syncope and collapse 08/06/2010  . EDEMA 10/14/2007  . UTI 07/21/2007  . LOW BACK PAIN 07/21/2007  . ELEVATED BP 07/21/2007  . Morbid (severe) obesity due to excess calories ([place]) 11/15/2006  . Migraines 11/15/2006    Past Surgical History:  Procedure Laterality Date  . BREAST REDUCTION SURGERY    . BREAST SURGERY  2005   reduction  . CESAREAN SECTION N/A 03/20/2016   Procedure: CESAREAN SECTION;  Surgeon: Christophe Louis, MD;  Location: [place];  Service: Obstetrics;  Laterality: N/A;  . TUBAL LIGATION Bilateral 03/20/2016   Procedure: BILATERAL TUBAL LIGATION;  Surgeon: Christophe Louis, MD;  Location: [place];  Service: Obstetrics;  Laterality: Bilateral;     OB History    Gravida  7   Para  2  Term  2   Preterm      AB  5   Living  2     SAB  3   TAB  2   Ectopic      Multiple  0   Live Births  2           Family History  Problem Relation Age of Onset  . Deep vein thrombosis Father   . Diabetes Father   . Hypertension Mother   . Hypertension Other   . Diabetes Paternal Aunt   . Heart disease Maternal Grandmother   . Heart disease Maternal Grandfather   . Diabetes Paternal Grandmother     Social History   Tobacco Use  . Smoking status: Never Smoker  . Smokeless tobacco: Never Used  Substance Use Topics  . Alcohol use: Yes    Comment:  occasionally  . Drug use: No    Home Medications Prior to Admission medications   Medication Sig Start Date End Date Taking? Authorizing Provider  albuterol (VENTOLIN HFA) 108 (90 Base) MCG/ACT inhaler Inhale 1-2 puffs into the lungs every 6 (six) hours as needed for wheezing or shortness of breath. 07/02/19   Marrian Salvage, FNP  budesonide-formoterol [facility]) 160-4.5 MCG/ACT inhaler Inhale 2 puffs into the lungs 2 (two) times daily. 08/19/18 08/19/19  Plotnikov, Evie Lacks, MD  busPIRone (BUSPAR) 10 MG tablet Take 1 tablet (10 mg total) by mouth 2 (two) times daily. 11/16/18   Plotnikov, Evie Lacks, MD  cetirizine (ZYRTEC) 10 MG tablet Take 10 mg by mouth daily as needed for allergies.    [provider]  Cholecalciferol (VITAMIN D3) 1.25 MG (50000 UT) CAPS Take 1 capsule by mouth once a week. 07/14/19   Plotnikov, Evie Lacks, MD  Cyanocobalamin (VITAMIN B-12) 1000 MCG SUBL Place 1 tablet (1,000 mcg total) under the tongue daily. 08/04/19   Plotnikov, Evie Lacks, MD  diclofenac Sodium (VOLTAREN) 1 % GEL Apply 2 g topically 4 (four) times daily. 08/19/19   Hoyt Koch, MD  escitalopram (LEXAPRO) 20 MG tablet Take 1 tablet (20 mg total) by mouth daily. 11/16/18   Plotnikov, Evie Lacks, MD  meclizine (ANTIVERT) 25 MG tablet Take 1 tablet (25 mg total) by mouth 3 (three) times daily as needed for dizziness. 12/04/18   Hoyt Koch, MD  Multiple Vitamin (MULTIVITAMIN) capsule Take 1 capsule by mouth daily.    [provider]  pantoprazole (PROTONIX) 40 MG tablet Take 1 tablet by mouth once daily 09/24/19   Plotnikov, Evie Lacks, MD  potassium chloride SA (KLOR-CON) 20 MEQ tablet Take 1 tablet (20 mEq total) by mouth daily. 07/13/19   Plotnikov, Evie Lacks, MD  rivaroxaban (XARELTO) 20 MG TABS tablet Take 1 tablet (20 mg total) by mouth daily with supper. 07/13/19   Plotnikov, Evie Lacks, MD  sulfamethoxazole-trimethoprim (BACTRIM DS) 800-160 MG tablet Take 1 tablet by mouth 2  (two) times daily. 08/19/19   Hoyt Koch, MD  triamterene-hydrochlorothiazide (MAXZIDE-25) 37.5-25 MG tablet Take 1 tablet by mouth daily. 09/24/19   Plotnikov, Evie Lacks, MD    Allergies    Patient has no known allergies.  Review of Systems   Review of Systems  Constitutional: Negative for diaphoresis and fever.  HENT: Negative for sore throat.   Eyes: Negative for visual disturbance.  Respiratory: Positive for shortness of breath. Negative for cough.   Cardiovascular: Positive for chest pain, palpitations and near-syncope.  Gastrointestinal: Negative for abdominal pain, nausea and vomiting.  Genitourinary: Negative for dysuria.  Musculoskeletal: Negative for back pain.  Skin: Negative for rash.  Neurological: Positive for dizziness and light-headedness. Negative for headaches.    Physical Exam Updated Vital Signs BP (!) 131/100 (BP Location: Left Arm)   Pulse 67   Temp 98 F (36.7 C) (Oral)   Resp 18   Ht 5\' 4"  (1.626 m)   Wt (!) 145.2 kg   LMP 09/12/2019   SpO2 98%   BMI 54.93 kg/m   Physical Exam Vitals and nursing note reviewed.  Constitutional:      General: She is not in acute distress.    Appearance: Normal appearance. She is well-developed. She is obese.  HENT:     Head: Normocephalic and atraumatic.  Eyes:     Conjunctiva/sclera: Conjunctivae normal.  Cardiovascular:     Rate and Rhythm: Normal rate and regular rhythm.     Pulses: Normal pulses.     Heart sounds: Normal heart sounds. No murmur heard.   Pulmonary:     Effort: Pulmonary effort is normal. No respiratory distress.     Breath sounds: Normal breath sounds.  Abdominal:     Palpations: Abdomen is soft.     Tenderness: There is no abdominal tenderness.  Musculoskeletal:        General: No tenderness. Normal range of motion.     Cervical back: Neck supple.     Right lower leg: No edema.     Left lower leg: No edema.  Skin:    General: Skin is warm and dry.     Capillary Refill:  Capillary refill takes less than 2 seconds.  Neurological:     General: No focal deficit present.     Mental Status: She is alert.     ED Results / Procedures / Treatments   Labs (all labs ordered are listed, but only abnormal results are displayed) Labs Reviewed  BASIC METABOLIC PANEL - Abnormal; Notable for the following components:      Result Value   Glucose, Bld 101 (*)    All other components within normal limits  CBC - Abnormal; Notable for the following components:   Hemoglobin 10.9 (*)    HCT 34.8 (*)    RDW 17.5 (*)    All other components within normal limits  I-STAT BETA HCG BLOOD, ED ([facility], [facility], [facility] ONLY)  TROPONIN I (HIGH SENSITIVITY)  TROPONIN I (HIGH SENSITIVITY)    EKG EKG Interpretation  Date/Time:  Sunday September 26 2019 23:26:41 EDT Ventricular Rate:  79 PR Interval:  152 QRS Duration: 72 QT Interval:  354 QTC Calculation: 405 R Axis:   53 Text Interpretation: Normal sinus rhythm Normal ECG motion artifact Otherwise no significant change Confirmed by Addison Lank [phone]) on 09/26/2019 11:31:14 PM   Radiology DG Chest 2 View  Result Date: 09/26/2019 CLINICAL DATA:  45 year old female with chest pain. EXAM: CHEST - 2 VIEW COMPARISON:  Chest radiograph dated 07/14/2019 FINDINGS: No focal consolidation, pleural effusion, or pneumothorax. The cardiac silhouette is within limits. No acute osseous pathology. Scoliosis of the thoracic spine. IMPRESSION: No active cardiopulmonary disease. Electronically Signed   By: Anner Crete M.D.   On: 09/26/2019 23:50    Procedures Procedures (including critical care time)  Medications Ordered in ED Medications - No data to display  ED Course  I have reviewed the triage vital signs and the nursing notes.  Pertinent labs & imaging results that were available during my care of the patient were reviewed by me  and considered in my medical decision making (see chart for details).    MDM Rules/Calculators/A&P                          This patient complains of chest pain and shortness of breath palpitations; this involves an extensive number of treatment Options and is a complaint that carries with it a high risk of complications and Morbidity. The differential includes paroxysmal A. fib, SVT, ACS, PE, reflux, musculoskeletal, anxiety  I ordered, reviewed and interpreted labs, which included CBC with normal white count, mild anemia stable, chemistries normal, delta troponin unchanged, pregnancy test negative  I ordered imaging studies which included chest x-ray and I independently    visualized and interpreted imaging which showed no acute infiltrate Previous records obtained and reviewed in epic  After the interventions stated above, I reevaluated the patient and found patient to be hemodynamically stable.  Her symptoms are improving.  No evidence of any arrhythmias on the monitor.  She is comfortable with plan for follow-up with cardiology and I did recommend that she get outpatient back monitoring and consideration for cardiology referral.  We will give her the number for the cardiology office so she can work on getting with them also.  Return instructions discussed   Final Clinical Impression(s) / ED Diagnoses Final diagnoses:  Nonspecific chest pain  Palpitations    Rx / DC Orders ED Discharge Orders    None       Hayden Rasmussen, MD 09/27/19 1815

## 2019-09-29 ENCOUNTER — Other Ambulatory Visit: Payer: Self-pay

## 2019-09-29 ENCOUNTER — Ambulatory Visit (INDEPENDENT_AMBULATORY_CARE_PROVIDER_SITE_OTHER): Payer: 59 | Admitting: Internal Medicine

## 2019-09-29 ENCOUNTER — Encounter: Payer: Self-pay | Admitting: Internal Medicine

## 2019-09-29 DIAGNOSIS — K219 Gastro-esophageal reflux disease without esophagitis: Secondary | ICD-10-CM

## 2019-09-29 DIAGNOSIS — I2699 Other pulmonary embolism without acute cor pulmonale: Secondary | ICD-10-CM

## 2019-09-29 DIAGNOSIS — I10 Essential (primary) hypertension: Secondary | ICD-10-CM

## 2019-09-29 DIAGNOSIS — F329 Major depressive disorder, single episode, unspecified: Secondary | ICD-10-CM

## 2019-09-29 DIAGNOSIS — F419 Anxiety disorder, unspecified: Secondary | ICD-10-CM

## 2019-09-29 DIAGNOSIS — F32A Depression, unspecified: Secondary | ICD-10-CM

## 2019-09-29 DIAGNOSIS — E538 Deficiency of other specified B group vitamins: Secondary | ICD-10-CM | POA: Diagnosis not present

## 2019-09-29 DIAGNOSIS — K5900 Constipation, unspecified: Secondary | ICD-10-CM

## 2019-09-29 DIAGNOSIS — E559 Vitamin D deficiency, unspecified: Secondary | ICD-10-CM

## 2019-09-29 DIAGNOSIS — R002 Palpitations: Secondary | ICD-10-CM | POA: Diagnosis not present

## 2019-09-29 MED ORDER — CLONAZEPAM 0.5 MG PO TBDP: 0.5000 mg | ORAL_TABLET | Freq: Two times a day (BID) | ORAL | 1 refills | Status: DC | PRN

## 2019-09-29 MED ORDER — PANTOPRAZOLE SODIUM 40 MG PO TBEC: 40.0000 mg | DELAYED_RELEASE_TABLET | Freq: Every day | ORAL | 3 refills | Status: DC

## 2019-09-29 MED ORDER — SENNOSIDES-DOCUSATE SODIUM 8.6-50 MG PO TABS
2.0000 | ORAL_TABLET | Freq: Every day | ORAL | 6 refills | Status: DC | PRN
Start: 2019-09-29 — End: 2020-06-21

## 2019-09-29 NOTE — Assessment & Plan Note (Signed)
Worse. Clonazepam wafer bid prn panic attack

## 2019-09-29 NOTE — Assessment & Plan Note (Signed)
Maxzide

## 2019-09-29 NOTE — Assessment & Plan Note (Signed)
Start to take Protonix daily

## 2019-09-29 NOTE — Assessment & Plan Note (Signed)
Palpitations/SOB/chest pressure while reading on 09/27/19. Pt called 911 after 2 minutes of her sx's. She felt like she was dying. Treat GERD Cardiol referral to r/o arrhythmias ?panic attack

## 2019-09-29 NOTE — Assessment & Plan Note (Signed)
On Vit D 

## 2019-09-29 NOTE — Assessment & Plan Note (Signed)
Hope to d/c Xarelto after wt loss surgery and wt loss

## 2019-09-29 NOTE — Assessment & Plan Note (Signed)
Sen[place]ot S prn

## 2019-09-29 NOTE — Progress Notes (Signed)
Subjective:  Patient ID: Bridget [name], female    DOB: [birth year]  Age: 44 y.o. MRN: [id number]  CC: No chief complaint on file.   HPI Bridget [name] presents for a post ER f/u for palpitations/SOB/chest pressure while reading on 09/27/19. Pt called 911 after 2 minutes of her sx's. She felt like she was dying. C/o stress  Per Dr Melina Copa: "This patient complains of chest pain and shortness of breath palpitations; this involves an extensive number of treatment Options and is a complaint that carries with it a high risk of complications and Morbidity. The differential includes paroxysmal A. fib, SVT, ACS, PE, reflux, musculoskeletal, anxiety  I ordered, reviewed and interpreted labs, which included CBC with normal white count, mild anemia stable, chemistries normal, delta troponin unchanged, pregnancy test negative  I ordered imaging studies which included chest x-ray and I independently    visualized and interpreted imaging which showed no acute infiltrate Previous records obtained and reviewed in epic  After the interventions stated above, I reevaluated the patient and found patient to be hemodynamically stable.  Her symptoms are improving.  No evidence of any arrhythmias on the monitor.  She is comfortable with plan for follow-up with cardiology and I did recommend that she get outpatient back monitoring and consideration for cardiology referral.  We will give her the number for the cardiology office so she can work on getting with them also.  Return instructions discussed"  Outpatient Medications Prior to Visit  Medication Sig Dispense Refill  . acetaminophen (TYLENOL) 500 MG tablet Take 1,000 mg by mouth every 6 (six) hours as needed for mild pain.    Marland Kitchen albuterol (VENTOLIN HFA) 108 (90 Base) MCG/ACT inhaler Inhale 1-2 puffs into the lungs every 6 (six) hours as needed for wheezing or shortness of breath. 18 g 1  . busPIRone (BUSPAR) 10 MG tablet Take 1 tablet (10 mg total) by  mouth 2 (two) times daily. 60 tablet 5  . cetirizine (ZYRTEC) 10 MG tablet Take 10 mg by mouth daily as needed for allergies.    . Cholecalciferol (VITAMIN D3) 1.25 MG (50000 UT) CAPS Take 1 capsule by mouth once a week. 12 capsule 3  . Cyanocobalamin (VITAMIN B-12) 1000 MCG SUBL Place 1 tablet (1,000 mcg total) under the tongue daily. 100 tablet 3  . diclofenac Sodium (VOLTAREN) 1 % GEL Apply 2 g topically 4 (four) times daily. (Patient taking differently: Apply 2 g topically 4 (four) times daily as needed (joint pain). ) 100 g 0  . escitalopram (LEXAPRO) 20 MG tablet Take 1 tablet (20 mg total) by mouth daily. 90 tablet 1  . meclizine (ANTIVERT) 25 MG tablet Take 1 tablet (25 mg total) by mouth 3 (three) times daily as needed for dizziness. 30 tablet 0  . Multiple Vitamin (MULTIVITAMIN) capsule Take 1 capsule by mouth daily.    . pantoprazole (PROTONIX) 40 MG tablet Take 1 tablet by mouth once daily (Patient taking differently: Take 40 mg by mouth daily as needed Jerrye Bushy). ) 30 tablet 5  . potassium chloride SA (KLOR-CON) 20 MEQ tablet Take 1 tablet (20 mEq total) by mouth daily. 90 tablet 3  . rivaroxaban (XARELTO) 20 MG TABS tablet Take 1 tablet (20 mg total) by mouth daily with supper. 30 tablet 5  . sulfamethoxazole-trimethoprim (BACTRIM DS) 800-160 MG tablet Take 1 tablet by mouth 2 (two) times daily. 10 tablet 0  . triamterene-hydrochlorothiazide (MAXZIDE-25) 37.5-25 MG tablet Take 1 tablet by mouth daily. 30 tablet  5  . budesonide-formoterol (SYMBICORT) 160-4.5 MCG/ACT inhaler Inhale 2 puffs into the lungs 2 (two) times daily. (Patient taking differently: Inhale 2 puffs into the lungs 2 (two) times daily as needed (SOB). ) 3 Inhaler 3   No facility-administered medications prior to visit.    ROS: Review of Systems  Constitutional: Negative for activity change, appetite change, chills, fatigue and unexpected weight change.  HENT: Negative for congestion, mouth sores and sinus pressure.     Eyes: Negative for visual disturbance.  Respiratory: Positive for chest tightness. Negative for cough.   Gastrointestinal: Negative for abdominal pain and nausea.  Genitourinary: Negative for difficulty urinating, frequency and vaginal pain.  Musculoskeletal: Negative for back pain and gait problem.  Skin: Negative for pallor and rash.  Neurological: Negative for dizziness, tremors, weakness, numbness and headaches.  Psychiatric/Behavioral: Positive for dysphoric mood. Negative for confusion and sleep disturbance. The patient is nervous/anxious.     Objective:  BP (!) 148/92 (BP Location: Right Arm, Patient Position: Sitting, Cuff Size: Large)   Pulse 99   Temp 98.3 F (36.8 C) (Oral)   Ht 5\' 4"  (1.626 m)   Wt (!) 337 lb (152.9 kg)   LMP 09/12/2019   SpO2 98%   BMI 57.85 kg/m   BP Readings from Last 3 Encounters:  09/29/19 (!) 148/92  09/27/19 (!) 131/100  08/19/19 (!) 142/80    Wt Readings from Last 3 Encounters:  09/29/19 (!) 337 lb (152.9 kg)  09/27/19 (!) 320 lb (145.2 kg)  08/19/19 (!) 334 lb 3.2 oz (151.6 kg)    Physical Exam Constitutional:      Appearance: She is obese.  Neurological:     Mental Status: She is oriented to person, place, and time.     Lab Results  Component Value Date   WBC 9.6 09/26/2019   HGB 10.9 (L) 09/26/2019   HCT 34.8 (L) 09/26/2019   PLT 368 09/26/2019   GLUCOSE 101 (H) 09/26/2019   CHOL 153 04/21/2018   TRIG 88.0 04/21/2018   HDL 39.40 04/21/2018   LDLCALC 96 04/21/2018   ALT 11 07/14/2019   AST 18 07/14/2019   NA 140 09/26/2019   K 3.8 09/26/2019   CL 104 09/26/2019   CREATININE 0.69 09/26/2019   BUN 10 09/26/2019   CO2 30 09/26/2019   TSH 0.88 07/13/2019   HGBA1C 5.3 10/29/2017    DG Chest 2 View  Result Date: 09/26/2019 CLINICAL DATA:  44 year old female with chest pain. EXAM: CHEST - 2 VIEW COMPARISON:  Chest radiograph dated 07/14/2019 FINDINGS: No focal consolidation, pleural effusion, or pneumothorax. The  cardiac silhouette is within limits. No acute osseous pathology. Scoliosis of the thoracic spine. IMPRESSION: No active cardiopulmonary disease. Electronically Signed   By: Anner Crete M.D.   On: 09/26/2019 23:50    Assessment & Plan:   There are no diagnoses linked to this encounter.   No orders of the defined types were placed in this encounter.    Follow-up: No follow-ups on file.  Walker Kehr, MD

## 2019-09-29 NOTE — Assessment & Plan Note (Signed)
On B12 

## 2019-09-30 NOTE — Telephone Encounter (Signed)
See OV  

## 2019-10-21 ENCOUNTER — Telehealth: Payer: Self-pay

## 2019-10-21 NOTE — Telephone Encounter (Signed)
New message   The patient voiced C/o having a cold for over a week sob barely can breathe that has settle in her chest the inhaler is not working at this time.   The patient is asking can Dr. Alain Marion refill the antibiotic he had done for her in the past the patient does not know the name of the antibiotic. Aware that Dr. Alain Marion is not in the office on Friday.   I have informed the patient their no open virtual slots for today. An appt is made virtual with Dr. Sharlet Salina on 8.6.21.   I informed the patient since she is having SOB that I will need to transfer her call over to [facility] triage for assessment.   The patient voiced triage is not helpful and they are going to tell me to go to the emergency room I am home with two small kids and they are just going to ask me questions.   The patient voiced she would like a message sent to Dr.Plotnikov CMA since she has been his past for years.   The patient thanked me for office information and stress the importance of sending a message around to the Dr. Alain Marion CMA for review.

## 2019-10-22 ENCOUNTER — Telehealth: Payer: 59 | Admitting: Internal Medicine

## 2019-10-22 NOTE — Telephone Encounter (Signed)
Noted.  Agree with appointment to see Dr. Sharlet Salina.  Thank you

## 2019-11-07 NOTE — Progress Notes (Deleted)
Cardiology Office Note:   Date:  11/07/2019  NAME:  Bridget [name]    MRN: [id number] DOB:  [birth year]   PCP:  Cassandria Anger, MD  Cardiologist:  No primary care provider on file.  Electrophysiologist:  None   Referring MD: Cassandria Anger, MD   No chief complaint on file. ***  History of Present Illness:   Bridget [name] is a 44 y.o. female with a hx of HTN, PE who is being seen today for the evaluation of palpitations at the request of Plotnikov, Evie Lacks, MD. TSH 0.88.   Past Medical History: Past Medical History:  Diagnosis Date  . Constipation   . Edema   . Headache(784.0)   . Morbid obesity ([place])   . Pulmonary embolism [facility])     Past Surgical History: Past Surgical History:  Procedure Laterality Date  . BREAST REDUCTION SURGERY    . BREAST SURGERY  2005   reduction  . CESAREAN SECTION N/A 03/20/2016   Procedure: CESAREAN SECTION;  Surgeon: Christophe Louis, MD;  Location: [place];  Service: Obstetrics;  Laterality: N/A;  . TUBAL LIGATION Bilateral 03/20/2016   Procedure: BILATERAL TUBAL LIGATION;  Surgeon: Christophe Louis, MD;  Location: [place];  Service: Obstetrics;  Laterality: Bilateral;    Current Medications: No outpatient medications have been marked as taking for the 11/09/19 encounter (Appointment) with O'Neal, Cassie Freer, MD.     Allergies:    Patient has no known allergies.   Social History: Social History   Socioeconomic History  . Marital status: Married    Spouse name: Not on file  . Number of children: 1  . Years of education: Not on file  . Highest education level: Not on file  Occupational History    Employer: [employer]  Tobacco Use  . Smoking status: Never Smoker  . Smokeless tobacco: Never Used  Substance and Sexual Activity  . Alcohol use: Yes    Comment: occasionally  . Drug use: No  . Sexual activity: Yes    Birth control/protection: None  Other Topics Concern  . Not on file  Social History  Narrative  . Not on file   Social Determinants of Health   Financial Resource Strain:   . Difficulty of Paying Living Expenses: Not on file  Food Insecurity:   . Worried About Charity fundraiser in the Last Year: Not on file  . Ran Out of Food in the Last Year: Not on file  Transportation Needs:   . Lack of Transportation (Medical): Not on file  . Lack of Transportation (Non-Medical): Not on file  Physical Activity:   . Days of Exercise per Week: Not on file  . Minutes of Exercise per Session: Not on file  Stress:   . Feeling of Stress : Not on file  Social Connections:   . Frequency of Communication with Friends and Family: Not on file  . Frequency of Social Gatherings with Friends and Family: Not on file  . Attends Religious Services: Not on file  . Active Member of Clubs or Organizations: Not on file  . Attends Archivist Meetings: Not on file  . Marital Status: Not on file     Family History: The patient's ***family history includes Deep vein thrombosis in her father; Diabetes in her father, paternal aunt, and paternal grandmother; Heart disease in her maternal grandfather and maternal grandmother; Hypertension in her mother and another family member.  ROS:   All other  ROS reviewed and negative. Pertinent positives noted in the HPI.     EKGs/Labs/Other Studies Reviewed:   The following studies were personally reviewed by me today:  EKG:  EKG is *** ordered today.  The ekg ordered today demonstrates ***, and was personally reviewed by me.   Recent Labs: 07/13/2019: TSH 0.88 07/14/2019: ALT 11 09/26/2019: BUN 10; Creatinine, Ser 0.69; Hemoglobin 10.9; Platelets 368; Potassium 3.8; Sodium 140   Recent Lipid Panel    Component Value Date/Time   CHOL 153 04/21/2018 1631   TRIG 88.0 04/21/2018 1631   HDL 39.40 04/21/2018 1631   CHOLHDL 4 04/21/2018 1631   VLDL 17.6 04/21/2018 1631   LDLCALC 96 04/21/2018 1631    Physical Exam:   VS:  There were no vitals  taken for this visit.   Wt Readings from Last 3 Encounters:  09/29/19 (!) 337 lb (152.9 kg)  09/27/19 (!) 320 lb (145.2 kg)  08/19/19 (!) 334 lb 3.2 oz (151.6 kg)    General: Well nourished, well developed, in no acute distress Heart: Atraumatic, normal size  Eyes: PEERLA, EOMI  Neck: Supple, no JVD Endocrine: No thryomegaly Cardiac: Normal S1, S2; RRR; no murmurs, rubs, or gallops Lungs: Clear to auscultation bilaterally, no wheezing, rhonchi or rales  Abd: Soft, nontender, no hepatomegaly  Ext: No edema, pulses 2+ Musculoskeletal: No deformities, BUE and BLE strength normal and equal Skin: Warm and dry, no rashes   Neuro: Alert and oriented to person, place, time, and situation, CNII-XII grossly intact, no focal deficits  Psych: Normal mood and affect   ASSESSMENT:   Bridget [name] is a 44 y.o. female who presents for the following: No diagnosis found.  PLAN:   There are no diagnoses linked to this encounter.  Disposition: No follow-ups on file.  Medication Adjustments/Labs and Tests Ordered: Current medicines are reviewed at length with the patient today.  Concerns regarding medicines are outlined above.  No orders of the defined types were placed in this encounter.  No orders of the defined types were placed in this encounter.   There are no Patient Instructions on file for this visit.   Time Spent with Patient: I have spent a total of *** minutes with patient reviewing hospital notes, telemetry, EKGs, labs and examining the patient as well as establishing an assessment and plan that was discussed with the patient.  > 50% of time was spent in direct patient care.  Signed, Addison Naegeli. Audie Box, [place]  [address], [place], [place] [address] [phone]  11/07/2019 6:44 PM

## 2019-11-09 ENCOUNTER — Ambulatory Visit: Payer: 59 | Admitting: Cardiovascular Disease

## 2019-11-17 ENCOUNTER — Encounter: Payer: Self-pay | Admitting: Cardiovascular Disease

## 2019-11-17 NOTE — Progress Notes (Signed)
Cardiology Office Note:    Date:  11/18/2019   ID:  Bridget [name], DOB February 26, 1976, MRN [id number]  PCP:  Cassandria Anger, MD  [facility] Cardiologist:  Wyvonne Lenz  [facility] Electrophysiologist:  None   Referring MD: Cassandria Anger, MD   Chief Complaint  Patient presents with   Palpitations    Sept. 2, 2021    Bridget [name] is a 44 y.o. female with a hx of possible  pulmonary embolism,  Morbid obesity,  We were asked to see her today by Dr. Alain Marion for further evaluation of her palpitations and dyspnea   Patient was seen by her primary medical doctor on September 29, 2019.  She developed palpitations/shortness of breath/chest pressure while reading.  Her symptoms lasted for about 2 minutes.  She thought that she was dying and called EMS.   She had symptoms in 2020 c/w pulmonary embolus .  Xarelto was started at that time.  She stopped it briefly but then requested to be restarted.   Several months ago.  She had just finished her hamburger, She developed immediately severe dyspena,  Mid chest pain  With radiation to her left shoulder .  May have had some anxiety / panic attack with this .  Felt faint  Received a [facility] NTG.  Her symptoms gradually eased while she was being taken to the ER .   Her primary started her on Klonipin for possible anxiety .   Echocardiogram from April, 2020 reveals normal left ventricular systolic function.  She had normal diastolic function.  There is no significant valvular disease.  The right ventricle was normal.  No regular exercise  Wt is 335 lbs  She walks 3 times a week .  She is going through  bariatric surgery program to have surgery  in the future.  She has 2 more appts.  Is able to walk without any chest pain or dyspnea.    Episodes are not associated with exercise, usually not with eating .   Still eats salty foods.  Potassium is usually low    CT angio of the chest was negative for pulmonary embolus.   she had been started on Xarelto and the Xarelto was continued because her symptoms were so c/w xarelto   Has seen pulmonary medicine     Past Medical History:  Diagnosis Date   Constipation    Edema    Headache(784.0)    Morbid obesity ([place])    Pulmonary embolism ([place])     Past Surgical History:  Procedure Laterality Date   BREAST REDUCTION SURGERY     BREAST SURGERY  2005   reduction   CESAREAN SECTION N/A 03/20/2016   Procedure: CESAREAN SECTION;  Surgeon: Christophe Louis, MD;  Location: [place];  Service: Obstetrics;  Laterality: N/A;   TUBAL LIGATION Bilateral 03/20/2016   Procedure: BILATERAL TUBAL LIGATION;  Surgeon: Christophe Louis, MD;  Location: [employer];  Service: Obstetrics;  Laterality: Bilateral;    Current Medications: Current Meds  Medication Sig   acetaminophen (TYLENOL) 500 MG tablet Take 1,000 mg by mouth every 6 (six) hours as needed for mild pain.   albuterol (VENTOLIN HFA) 108 (90 Base) MCG/ACT inhaler Inhale 1-2 puffs into the lungs every 6 (six) hours as needed for wheezing or shortness of breath.   budesonide-formoterol (SYMBICORT) 160-4.5 MCG/ACT inhaler Inhale 2 puffs into the lungs 2 (two) times daily.   cetirizine (ZYRTEC) 10 MG tablet Take 10 mg by mouth  daily as needed for allergies.   Cholecalciferol (VITAMIN D3) 1.25 MG (50000 UT) CAPS Take 1 capsule by mouth once a week.   clonazePAM (KLONOPIN) 0.5 MG disintegrating tablet Take 1-2 tablets (0.5-1 mg total) by mouth 2 (two) times daily as needed (panic attacks).   Cyanocobalamin (VITAMIN B-12) 1000 MCG SUBL Place 1 tablet (1,000 mcg total) under the tongue daily.   diclofenac Sodium (VOLTAREN) 1 % GEL Apply 2 g topically 4 (four) times daily.   meclizine (ANTIVERT) 25 MG tablet Take 1 tablet (25 mg total) by mouth 3 (three) times daily as needed for dizziness.   Multiple Vitamin (MULTIVITAMIN) capsule Take 1 capsule by mouth daily.   pantoprazole (PROTONIX) 40 MG tablet  Take 1 tablet (40 mg total) by mouth daily.   rivaroxaban (XARELTO) 20 MG TABS tablet Take 1 tablet (20 mg total) by mouth daily with supper.   senna-docusate (SENOKOT-S) 8.6-50 MG tablet Take 2 tablets by mouth daily as needed for mild constipation or moderate constipation.   [DISCONTINUED] busPIRone (BUSPAR) 10 MG tablet Take 1 tablet (10 mg total) by mouth 2 (two) times daily.   [DISCONTINUED] escitalopram (LEXAPRO) 20 MG tablet Take 1 tablet (20 mg total) by mouth daily.   [DISCONTINUED] potassium chloride SA (KLOR-CON) 20 MEQ tablet Take 1 tablet (20 mEq total) by mouth daily.   [DISCONTINUED] sulfamethoxazole-trimethoprim (BACTRIM DS) 800-160 MG tablet Take 1 tablet by mouth 2 (two) times daily.   [DISCONTINUED] triamterene-hydrochlorothiazide (MAXZIDE-25) 37.5-25 MG tablet Take 1 tablet by mouth daily.     Allergies:   Patient has no known allergies.   Social History   Socioeconomic History   Marital status: Married    Spouse name: Not on file   Number of children: 1   Years of education: Not on file   Highest education level: Not on file  Occupational History    Employer: [employer]  Tobacco Use   Smoking status: Never Smoker   Smokeless tobacco: Never Used  Substance and Sexual Activity   Alcohol use: Yes    Comment: occasionally   Drug use: No   Sexual activity: Yes    Birth control/protection: None  Other Topics Concern   Not on file  Social History Narrative   Not on file   Social Determinants of Health   Financial Resource Strain:    Difficulty of Paying Living Expenses: Not on file  Food Insecurity:    Worried About Charity fundraiser in the Last Year: Not on file   [employer] of Food in the Last Year: Not on file  Transportation Needs:    Lack of Transportation (Medical): Not on file   Lack of Transportation (Non-Medical): Not on file  Physical Activity:    Days of Exercise per Week: Not on file   Minutes of Exercise per  Session: Not on file  Stress:    Feeling of Stress : Not on file  Social Connections:    Frequency of Communication with Friends and Family: Not on file   Frequency of Social Gatherings with Friends and Family: Not on file   Attends Religious Services: Not on file   Active Member of Clubs or Organizations: Not on file   Attends Archivist Meetings: Not on file   Marital Status: Not on file     Family History: The patient's family history includes Deep vein thrombosis in her father; Diabetes in her father, paternal aunt, and paternal grandmother; Heart disease in her maternal grandfather and  maternal grandmother; Hypertension in her mother and another family member.  ROS:   Please see the history of present illness.     All other systems reviewed and are negative.  EKGs/Labs/Other Studies Reviewed:    The following studies were reviewed today:   EKG:     Sept. 2, 2021:  NSR at 86. No ST or T wave abn.     Recent Labs: 07/13/2019: TSH 0.88 07/14/2019: ALT 11 09/26/2019: BUN 10; Creatinine, Ser 0.69; Hemoglobin 10.9; Platelets 368; Potassium 3.8; Sodium 140  Recent Lipid Panel    Component Value Date/Time   CHOL 153 04/21/2018 1631   TRIG 88.0 04/21/2018 1631   HDL 39.40 04/21/2018 1631   CHOLHDL 4 04/21/2018 1631   VLDL 17.6 04/21/2018 1631   LDLCALC 96 04/21/2018 1631    Physical Exam:    VS:  BP 128/82    Pulse 86    Ht 5\' 4"  (1.626 m)    Wt (!) 335 lb (152 kg)    SpO2 96%    BMI 57.50 kg/m     Wt Readings from Last 3 Encounters:  11/18/19 (!) 335 lb (152 kg)  09/29/19 (!) 337 lb (152.9 kg)  09/27/19 (!) 320 lb (145.2 kg)     GEN:   Morbidly obese female.  HEENT: Normal NECK: No JVD; No carotid bruits LYMPHATICS: No lymphadenopathy CARDIAC: RRR, no murmurs, rubs, gallops RESPIRATORY:  Clear to auscultation without rales, wheezing or rhonchi  ABDOMEN: Soft, non-tender, non-distended MUSCULOSKELETAL:  No edema; No deformity  SKIN: Warm and  dry NEUROLOGIC:  Alert and oriented x 3 PSYCHIATRIC:  Normal affect   ASSESSMENT:    1. Hypertension, unspecified type   2. Palpitations    PLAN:    In order of problems listed above:  1. Palpitations: It is unclear whether her palpitations are due to anxiety or perhaps SVT.  I taught her the Valsalva maneuver and also using a diving reflex to stop her episodes of tachycardia.  These might be useful if she is found to have SVT.  We will place a 30-day event monitor for further evaluation.  She might be having episodes of atrial fibrillation.  She is already received a prescription for Klonopin for anxiety and this seems to be helping some.  2.  Hypertension: She still eats a very poor diet.  She had a hot dog last night.  We have gone over a very brief list of some dietary restrictions.  I will give her a list of the Mediterranean diet.  I have advised her to stay away from processed meats.  3.  Morbid obesity :   Mediterranean diet ,  Calorie restriction.  Exercise   Will see her in 3 months 3   Medication Adjustments/Labs and Tests Ordered: Current medicines are reviewed at length with the patient today.  Concerns regarding medicines are outlined above.  Orders Placed This Encounter  Procedures   Basic metabolic panel   CARDIAC EVENT MONITOR   EKG 12-Lead   Meds ordered this encounter  Medications   spironolactone (ALDACTONE) 25 MG tablet    Sig: Take 1 tablet (25 mg total) by mouth daily.    Dispense:  90 tablet    Refill:  3    Dc maxzide and kdur     Patient Instructions  Medication Instructions:  Your physician has recommended you make the following change in your medication:  1.) stop Maxzide (triamterene/hctz) 2.) stop k-dur 3.) start spironolactone (Aldactone) 25 mg -  one tablet by mouth daily  *If you need a refill on your cardiac medications before your next appointment, please call your pharmacy*   Lab Work: BMET in 2-3 weeks  If you have labs  (blood work) drawn today and your tests are completely normal, you will receive your results only by:  [place] (if you have MyChart) OR  A paper copy in the mail If you have any lab test that is abnormal or we need to change your treatment, we will call you to review the results.   Testing/Procedures: Your physician has recommended that you wear an event monitor. Event monitors are medical devices that record the hearts electrical activity. Doctors most often [place] these monitors to diagnose arrhythmias. Arrhythmias are problems with the speed or rhythm of the heartbeat. The monitor is a small, portable device. You can wear one while you do your normal daily activities. This is usually used to diagnose what is causing palpitations/syncope (passing out).  [employer] Cardiac Event Monitor Instructions Your physician has requested you wear your cardiac event monitor for _____ days, (1-30). [employer] may call or text to confirm a shipping address. The monitor will be sent to a land address via UPS. [employer] will not ship a monitor to a PO BOX. It typically takes 3-5 days to receive your monitor after it has been enrolled. [employer] will assist with USPS tracking if your package is delayed. The telephone number for [employer] is [phone]. Once you have received your monitor, please review the enclosed instructions. Instruction tutorials can also be viewed under help and settings on the enclosed cell phone. Your monitor has already been registered assigning a specific monitor serial # to you.  Applying the monitor Remove cell phone from case and turn it on. The cell phone works as Dealer and needs to be within [employer] of you at all times. The cell phone will need to be charged on a daily basis. We recommend you plug the cell phone into the enclosed charger at your bedside table every night.  Monitor batteries: You will receive two monitor batteries labelled #1 and #2.  These are your recorders. Plug battery #2 onto the second connection on the enclosed charger. Keep one battery on the charger at all times. This will keep the monitor battery deactivated. It will also keep it fully charged for when you need to switch your monitor batteries. A small light will be blinking on the battery emblem when it is charging. The light on the battery emblem will remain on when the battery is fully charged.  Open package of a Monitor strip. Insert battery #1 into black hood on strip and gently squeeze monitor battery onto connection as indicated in instruction booklet. Set aside while preparing skin.  Choose location for your strip, vertical or horizontal, as indicated in the instruction booklet. Shave to remove all hair from location. There cannot be any lotions, oils, powders, or colognes on skin where monitor is to be applied. Wipe skin clean with enclosed Saline wipe. Dry skin completely.  Peel paper labeled #1 off the back of the Monitor strip exposing the adhesive. Place the monitor on the chest in the vertical or horizontal position shown in the instruction booklet. One arrow on the monitor strip must be pointing upward. Carefully remove paper labeled #2, attaching remainder of strip to your skin. Try not to create any folds or wrinkles in the strip as you apply it.  Firmly press and release the circle in the  center of the monitor battery. You will hear a small beep. This is turning the monitor battery on. The heart emblem on the monitor battery will light up every 5 seconds if the monitor battery in turned on and connected to the patient securely. Do not push and hold the circle down as this turns the monitor battery off. The cell phone will locate the monitor battery. A screen will appear on the cell phone checking the connection of your monitor strip. This may read poor connection initially but change to good connection within the next minute. Once your monitor  accepts the connection you will hear a series of 3 beeps followed by a climbing crescendo of beeps. A screen will appear on the cell phone showing the two monitor strip placement options. Touch the picture that demonstrates where you applied the monitor strip.  Your monitor strip and battery are waterproof. You are able to shower, bathe, or swim with the monitor on. They just ask you do not submerge deeper than 3 feet underwater. We recommend removing the monitor if you are swimming in a lake, river, or ocean.  Your monitor battery will need to be switched to a fully charged monitor battery approximately once a week. The cell phone will alert you of an action which needs to be made.  On the cell phone, tap for details to reveal connection status, monitor battery status, and cell phone battery status. The green dots indicates your monitor is in good status. A red dot indicates there is something that needs your attention.  To record a symptom, click the circle on the monitor battery. In 30-60 seconds a list of symptoms will appear on the cell phone. Select your symptom and tap save. Your monitor will record a sustained or significant arrhythmia regardless of you clicking the button. Some patients do not feel the heart rhythm irregularities. [employer] will notify us of any serious or critical events.  Refer to instruction booklet for instructions on switching batteries, changing strips, the Do not disturb or Pause features, or any additional questions.  Call [employer] at [phone], to confirm your monitor is transmitting and record your baseline. They will answer any questions you may have regarding the monitor instructions at that time.  Returning the monitor to [place] all equipment back into blue box. Peel off strip of paper to expose adhesive and close box securely. There is a prepaid UPS shipping label on this box. Drop in a UPS drop box, or at a UPS facility like  [employer]. You may also contact [employer] to arrange UPS to pick up monitor package at your home.    Follow-Up: At [facility], you and your health needs are our priority.  As part of our continuing mission to provide you with exceptional heart care, we have created designated Provider Care Teams.  These Care Teams include your primary Cardiologist (physician) and Advanced Practice Providers (APPs -  Physician Assistants and Nurse Practitioners) who all work together to provide you with the care you need, when you need it.   Your next appointment:   3 month(s)  The format for your next appointment:   In Person  Provider:   You may see Thayer Headings, MD or one of the following Advanced Practice Providers on your designated Care Team:    Richardson Dopp, PA-C  Vin Padroni, [place]    Other Instructions  Mediterranean Diet A Mediterranean diet refers to food and lifestyle choices that are based on the traditions of  countries located on the [employer]. This way of eating has been shown to help prevent certain conditions and improve outcomes for people who have chronic diseases, like kidney disease and heart disease. What are tips for following this plan? Lifestyle  Cook and eat meals together with your family, when possible.  Drink enough fluid to keep your urine clear or pale yellow.  Be physically active every day. This includes: ? Aerobic exercise like running or swimming. ? Leisure activities like gardening, walking, or housework.  Get 7-8 hours of sleep each night.  If recommended by your health care provider, drink red wine in moderation. This means 1 glass a day for nonpregnant women and 2 glasses a day for men. A glass of wine equals 5 oz (150 mL). Reading food labels   Check the serving size of packaged foods. For foods such as rice and pasta, the serving size refers to the amount of cooked product, not dry.  Check the total fat in packaged foods. Avoid foods  that have saturated fat or trans fats.  Check the ingredients list for added sugars, such as corn syrup. Shopping  At the grocery store, buy most of your food from the areas near the walls of the store. This includes: ? Fresh fruits and vegetables (produce). ? Grains, beans, nuts, and seeds. Some of these may be available in unpackaged forms or large amounts (in bulk). ? Fresh seafood. ? Poultry and eggs. ? Low-fat dairy products.  Buy whole ingredients instead of prepackaged foods.  Buy fresh fruits and vegetables in-season from local farmers markets.  Buy frozen fruits and vegetables in resealable bags.  If you do not have access to quality fresh seafood, buy precooked frozen shrimp or canned fish, such as tuna, salmon, or sardines.  Buy small amounts of raw or cooked vegetables, salads, or olives from the deli or salad bar at your store.  Stock your pantry so you always have certain foods on hand, such as olive oil, canned tuna, canned tomatoes, rice, pasta, and beans. Cooking  Cook foods with extra-virgin olive oil instead of using butter or other vegetable oils.  Have meat as a side dish, and have vegetables or grains as your main dish. This means having meat in small portions or adding small amounts of meat to foods like pasta or stew.  Use beans or vegetables instead of meat in common dishes like chili or lasagna.  Experiment with different cooking methods. Try roasting or broiling vegetables instead of steaming or sauteing them.  Add frozen vegetables to soups, stews, pasta, or rice.  Add nuts or seeds for added healthy fat at each meal. You can add these to yogurt, salads, or vegetable dishes.  Marinate fish or vegetables using olive oil, lemon juice, garlic, and fresh herbs. Meal planning   Plan to eat 1 vegetarian meal one day each week. Try to work up to 2 vegetarian meals, if possible.  Eat seafood 2 or more times a week.  Have healthy snacks readily  available, such as: ? Vegetable sticks with hummus. ? [place] yogurt. ? Fruit and nut trail mix.  Eat balanced meals throughout the week. This includes: ? Fruit: 2-3 servings a day ? Vegetables: 4-5 servings a day ? Low-fat dairy: 2 servings a day ? Fish, poultry, or lean meat: 1 serving a day ? Beans and legumes: 2 or more servings a week ? Nuts and seeds: 1-2 servings a day ? Whole grains: 6-8 servings a day ? Extra-virgin  olive oil: 3-4 servings a day  Limit red meat and sweets to only a few servings a month What are my food choices?  Mediterranean diet ? Recommended  Grains: Whole-grain pasta. Brown rice. Bulgar wheat. Polenta. Couscous. Whole-wheat bread. [place].  Vegetables: Artichokes. Beets. Broccoli. Cabbage. Carrots. Eggplant. Green beans. Chard. Kale. Spinach. Onions. Leeks. Peas. Squash. Tomatoes. Peppers. Radishes.  Fruits: Apples. Apricots. Avocado. Berries. Bananas. Cherries. Dates. Figs. Grapes. Lemons. Melon. Oranges. Peaches. Plums. Pomegranate.  Meats and other protein foods: Beans. Almonds. Sunflower seeds. Pine nuts. Peanuts. [place]. Salmon. Scallops. Shrimp. [place]. Tilapia. Clams. Oysters. Eggs.  Dairy: Low-fat milk. Cheese. Greek yogurt.  Beverages: Water. Red wine. Herbal tea.  Fats and oils: Extra virgin olive oil. Avocado oil. Grape seed oil.  Sweets and desserts: [place] yogurt with honey. Baked apples. Poached pears. Trail mix.  Seasoning and other foods: Basil. Cilantro. Coriander. Cumin. Mint. Parsley. Sage. Rosemary. Tarragon. Garlic. Oregano. Thyme. Pepper. Balsalmic vinegar. Tahini. Hummus. Tomato sauce. Olives. Mushrooms. ? Limit these  Grains: Prepackaged pasta or rice dishes. Prepackaged cereal with added sugar.  Vegetables: Deep fried potatoes (french fries).  Fruits: Fruit canned in syrup.  Meats and other protein foods: Beef. Pork. Lamb. Poultry with skin. Hot dogs. [place] Salines.  Dairy: Ice cream. Sour cream. Whole milk.  Beverages:  Juice. Sugar-sweetened soft drinks. Beer. Liquor and spirits.  Fats and oils: Butter. Canola oil. Vegetable oil. Beef fat (tallow). Lard.  Sweets and desserts: Cookies. Cakes. Pies. Candy.  Seasoning and other foods: Mayonnaise. Premade sauces and marinades. The items listed may not be a complete list. Talk with your dietitian about what dietary choices are right for you. Summary  The Mediterranean diet includes both food and lifestyle choices.  Eat a variety of fresh fruits and vegetables, beans, nuts, seeds, and whole grains.  Limit the amount of red meat and sweets that you eat.  Talk with your health care provider about whether it is safe for you to drink red wine in moderation. This means 1 glass a day for nonpregnant women and 2 glasses a day for men. A glass of wine equals 5 oz (150 mL). This information is not intended to replace advice given to you by your health care provider. Make sure you discuss any questions you have with your health care provider. Document Revised: 11/02/2015 Document Reviewed: 10/26/2015 Elsevier Patient Education  2020 [place], Mertie Moores, MD  11/18/2019 5:34 PM    [place]

## 2019-11-18 ENCOUNTER — Ambulatory Visit (INDEPENDENT_AMBULATORY_CARE_PROVIDER_SITE_OTHER): Payer: 59 | Admitting: Cardiovascular Disease

## 2019-11-18 ENCOUNTER — Telehealth: Payer: Self-pay

## 2019-11-18 ENCOUNTER — Encounter: Payer: Self-pay | Admitting: Cardiovascular Disease

## 2019-11-18 ENCOUNTER — Other Ambulatory Visit: Payer: Self-pay

## 2019-11-18 VITALS — BP 128/82 | HR 86 | Ht 64.0 in | Wt 335.0 lb

## 2019-11-18 DIAGNOSIS — R002 Palpitations: Secondary | ICD-10-CM

## 2019-11-18 DIAGNOSIS — I10 Essential (primary) hypertension: Secondary | ICD-10-CM | POA: Diagnosis not present

## 2019-11-18 MED ORDER — SPIRONOLACTONE 25 MG PO TABS
25.0000 mg | ORAL_TABLET | Freq: Every day | ORAL | 3 refills | Status: DC
Start: 2019-11-18 — End: 2020-06-21

## 2019-11-18 NOTE — Patient Instructions (Addendum)
Medication Instructions:  Your physician has recommended you make the following change in your medication:  1.) stop Maxzide (triamterene/hctz) 2.) stop k-dur 3.) start spironolactone (Aldactone) 25 mg - one tablet by mouth daily  *If you need a refill on your cardiac medications before your next appointment, please call your pharmacy*   Lab Work: BMET in 2-3 weeks  If you have labs (blood work) drawn today and your tests are completely normal, you will receive your results only by: Marland Kitchen MyChart Message (if you have MyChart) OR . A paper copy in the mail If you have any lab test that is abnormal or we need to change your treatment, we will call you to review the results.   Testing/Procedures: Your physician has recommended that you wear an event monitor. Event monitors are medical devices that record the heart's electrical activity. Doctors most often [place] these monitors to diagnose arrhythmias. Arrhythmias are problems with the speed or rhythm of the heartbeat. The monitor is a small, portable device. You can wear one while you do your normal daily activities. This is usually used to diagnose what is causing palpitations/syncope (passing out).  [employer] Cardiac Event Monitor Instructions Your physician has requested you wear your cardiac event monitor for _____ days, (1-30). [employer] may call or text to confirm a shipping address. The monitor will be sent to a land address via UPS. [employer] will not ship a monitor to a PO BOX. It typically takes 3-5 days to receive your monitor after it has been enrolled. [employer] will assist with USPS tracking if your package is delayed. The telephone number for [employer] is [phone]. Once you have received your monitor, please review the enclosed instructions. Instruction tutorials can also be viewed under help and settings on the enclosed cell phone. Your monitor has already been registered assigning a specific monitor serial # to  you.  Applying the monitor Remove cell phone from case and turn it on. The cell phone works as Dealer and needs to be within [employer] of you at all times. The cell phone will need to be charged on a daily basis. We recommend you plug the cell phone into the enclosed charger at your bedside table every night.  Monitor batteries: You will receive two monitor batteries labelled #1 and #2. These are your recorders. Plug battery #2 onto the second connection on the enclosed charger. Keep one battery on the charger at all times. This will keep the monitor battery deactivated. It will also keep it fully charged for when you need to switch your monitor batteries. A small light will be blinking on the battery emblem when it is charging. The light on the battery emblem will remain on when the battery is fully charged.  Open package of a Monitor strip. Insert battery #1 into black hood on strip and gently squeeze monitor battery onto connection as indicated in instruction booklet. Set aside while preparing skin.  Choose location for your strip, vertical or horizontal, as indicated in the instruction booklet. Shave to remove all hair from location. There cannot be any lotions, oils, powders, or colognes on skin where monitor is to be applied. Wipe skin clean with enclosed Saline wipe. Dry skin completely.  Peel paper labeled #1 off the back of the Monitor strip exposing the adhesive. Place the monitor on the chest in the vertical or horizontal position shown in the instruction booklet. One arrow on the monitor strip must be pointing upward. Carefully remove paper labeled #2, attaching remainder  of strip to your skin. Try not to create any folds or wrinkles in the strip as you apply it.  Firmly press and release the circle in the center of the monitor battery. You will hear a small beep. This is turning the monitor battery on. The heart emblem on the monitor battery will light up every 5  seconds if the monitor battery in turned on and connected to the patient securely. Do not push and hold the circle down as this turns the monitor battery off. The cell phone will locate the monitor battery. A screen will appear on the cell phone checking the connection of your monitor strip. This may read poor connection initially but change to good connection within the next minute. Once your monitor accepts the connection you will hear a series of 3 beeps followed by a climbing crescendo of beeps. A screen will appear on the cell phone showing the two monitor strip placement options. Touch the picture that demonstrates where you applied the monitor strip.  Your monitor strip and battery are waterproof. You are able to shower, bathe, or swim with the monitor on. They just ask you do not submerge deeper than 3 feet underwater. We recommend removing the monitor if you are swimming in a lake, river, or ocean.  Your monitor battery will need to be switched to a fully charged monitor battery approximately once a week. The cell phone will alert you of an action which needs to be made.  On the cell phone, tap for details to reveal connection status, monitor battery status, and cell phone battery status. The green dots indicates your monitor is in good status. A red dot indicates there is something that needs your attention.  To record a symptom, click the circle on the monitor battery. In 30-60 seconds a list of symptoms will appear on the cell phone. Select your symptom and tap save. Your monitor will record a sustained or significant arrhythmia regardless of you clicking the button. Some patients do not feel the heart rhythm irregularities. [employer] will notify us of any serious or critical events.  Refer to instruction booklet for instructions on switching batteries, changing strips, the Do not disturb or Pause features, or any additional questions.  Call [employer] at [phone], to  confirm your monitor is transmitting and record your baseline. They will answer any questions you may have regarding the monitor instructions at that time.  Returning the monitor to [place] all equipment back into blue box. Peel off strip of paper to expose adhesive and close box securely. There is a prepaid UPS shipping label on this box. Drop in a UPS drop box, or at a UPS facility like [employer]. You may also contact [employer] to arrange UPS to pick up monitor package at your home.    Follow-Up: At [facility], you and your health needs are our priority.  As part of our continuing mission to provide you with exceptional heart care, we have created designated Provider Care Teams.  These Care Teams include your primary Cardiologist (physician) and Advanced Practice Providers (APPs -  Physician Assistants and Nurse Practitioners) who all work together to provide you with the care you need, when you need it.   Your next appointment:   3 month(s)  The format for your next appointment:   In Person  Provider:   You may see Thayer Headings, MD or one of the following Advanced Practice Providers on your designated Care Team:    Richardson Dopp,  PA-C  Vin Bhagat, PA-C    Other Instructions  Mediterranean Diet A Mediterranean diet refers to food and lifestyle choices that are based on the traditions of countries located on the [employer]. This way of eating has been shown to help prevent certain conditions and improve outcomes for people who have chronic diseases, like kidney disease and heart disease. What are tips for following this plan? Lifestyle  Cook and eat meals together with your family, when possible.  Drink enough fluid to keep your urine clear or pale yellow.  Be physically active every day. This includes: ? Aerobic exercise like running or swimming. ? Leisure activities like gardening, walking, or housework.  Get 7-8 hours of sleep each night.  If  recommended by your health care provider, drink red wine in moderation. This means 1 glass a day for nonpregnant women and 2 glasses a day for men. A glass of wine equals 5 oz (150 mL). Reading food labels   Check the serving size of packaged foods. For foods such as rice and pasta, the serving size refers to the amount of cooked product, not dry.  Check the total fat in packaged foods. Avoid foods that have saturated fat or trans fats.  Check the ingredients list for added sugars, such as corn syrup. Shopping  At the grocery store, buy most of your food from the areas near the walls of the store. This includes: ? Fresh fruits and vegetables (produce). ? Grains, beans, nuts, and seeds. Some of these may be available in unpackaged forms or large amounts (in bulk). ? Fresh seafood. ? Poultry and eggs. ? Low-fat dairy products.  Buy whole ingredients instead of prepackaged foods.  Buy fresh fruits and vegetables in-season from local farmers markets.  Buy frozen fruits and vegetables in resealable bags.  If you do not have access to quality fresh seafood, buy precooked frozen shrimp or canned fish, such as tuna, salmon, or sardines.  Buy small amounts of raw or cooked vegetables, salads, or olives from the deli or salad bar at your store.  Stock your pantry so you always have certain foods on hand, such as olive oil, canned tuna, canned tomatoes, rice, pasta, and beans. Cooking  Cook foods with extra-virgin olive oil instead of using butter or other vegetable oils.  Have meat as a side dish, and have vegetables or grains as your main dish. This means having meat in small portions or adding small amounts of meat to foods like pasta or stew.  Use beans or vegetables instead of meat in common dishes like chili or lasagna.  Experiment with different cooking methods. Try roasting or broiling vegetables instead of steaming or sauteing them.  Add frozen vegetables to soups, stews, pasta,  or rice.  Add nuts or seeds for added healthy fat at each meal. You can add these to yogurt, salads, or vegetable dishes.  Marinate fish or vegetables using olive oil, lemon juice, garlic, and fresh herbs. Meal planning   Plan to eat 1 vegetarian meal one day each week. Try to work up to 2 vegetarian meals, if possible.  Eat seafood 2 or more times a week.  Have healthy snacks readily available, such as: ? Vegetable sticks with hummus. ? [place] yogurt. ? Fruit and nut trail mix.  Eat balanced meals throughout the week. This includes: ? Fruit: 2-3 servings a day ? Vegetables: 4-5 servings a day ? Low-fat dairy: 2 servings a day ? Fish, poultry, or lean meat: 1 serving  a day ? Beans and legumes: 2 or more servings a week ? Nuts and seeds: 1-2 servings a day ? Whole grains: 6-8 servings a day ? Extra-virgin olive oil: 3-4 servings a day  Limit red meat and sweets to only a few servings a month What are my food choices?  Mediterranean diet ? Recommended  Grains: Whole-grain pasta. Brown rice. Bulgar wheat. Polenta. Couscous. Whole-wheat bread. [place].  Vegetables: Artichokes. Beets. Broccoli. Cabbage. Carrots. Eggplant. Green beans. Chard. Kale. Spinach. Onions. Leeks. Peas. Squash. Tomatoes. Peppers. Radishes.  Fruits: Apples. Apricots. Avocado. Berries. Bananas. Cherries. Dates. Figs. Grapes. Lemons. Melon. Oranges. Peaches. Plums. Pomegranate.  Meats and other protein foods: Beans. Almonds. Sunflower seeds. Pine nuts. Peanuts. [place]. Salmon. Scallops. Shrimp. [place]. Tilapia. Clams. Oysters. Eggs.  Dairy: Low-fat milk. Cheese. Greek yogurt.  Beverages: Water. Red wine. Herbal tea.  Fats and oils: Extra virgin olive oil. Avocado oil. Grape seed oil.  Sweets and desserts: [place] yogurt with honey. Baked apples. Poached pears. Trail mix.  Seasoning and other foods: Basil. Cilantro. Coriander. Cumin. Mint. Parsley. Sage. Rosemary. Tarragon. Garlic. Oregano. Thyme.  Pepper. Balsalmic vinegar. Tahini. Hummus. Tomato sauce. Olives. Mushrooms. ? Limit these  Grains: Prepackaged pasta or rice dishes. Prepackaged cereal with added sugar.  Vegetables: Deep fried potatoes (french fries).  Fruits: Fruit canned in syrup.  Meats and other protein foods: Beef. Pork. Lamb. Poultry with skin. Hot dogs. [place] Salines.  Dairy: Ice cream. Sour cream. Whole milk.  Beverages: Juice. Sugar-sweetened soft drinks. Beer. Liquor and spirits.  Fats and oils: Butter. Canola oil. Vegetable oil. Beef fat (tallow). Lard.  Sweets and desserts: Cookies. Cakes. Pies. Candy.  Seasoning and other foods: Mayonnaise. Premade sauces and marinades. The items listed may not be a complete list. Talk with your dietitian about what dietary choices are right for you. Summary  The Mediterranean diet includes both food and lifestyle choices.  Eat a variety of fresh fruits and vegetables, beans, nuts, seeds, and whole grains.  Limit the amount of red meat and sweets that you eat.  Talk with your health care provider about whether it is safe for you to drink red wine in moderation. This means 1 glass a day for nonpregnant women and 2 glasses a day for men. A glass of wine equals 5 oz (150 mL). This information is not intended to replace advice given to you by your health care provider. Make sure you discuss any questions you have with your health care provider. Document Revised: 11/02/2015 Document Reviewed: 10/26/2015 Elsevier Patient Education  [place].

## 2019-11-18 NOTE — Telephone Encounter (Signed)
Prior authorization initiated for pantoprazole sodium  Key: [id number]

## 2019-11-26 NOTE — Telephone Encounter (Signed)
[id number]. PANTOPRAZOLE TAB 40MG  is approved through 11/17/2020

## 2019-11-29 ENCOUNTER — Telehealth (INDEPENDENT_AMBULATORY_CARE_PROVIDER_SITE_OTHER): Payer: 59 | Admitting: Family

## 2019-11-29 ENCOUNTER — Other Ambulatory Visit: Payer: Self-pay

## 2019-11-29 DIAGNOSIS — J209 Acute bronchitis, unspecified: Secondary | ICD-10-CM

## 2019-11-29 MED ORDER — DOXYCYCLINE HYCLATE 100 MG PO TABS
100.0000 mg | ORAL_TABLET | Freq: Two times a day (BID) | ORAL | 0 refills | Status: DC
Start: 2019-11-29 — End: 2019-12-30

## 2019-11-29 NOTE — Progress Notes (Signed)
Bridget [name] is a 44 y.o. female with the following history as recorded in EpicCare:  Patient Active Problem List   Diagnosis Date Noted  . GERD (gastroesophageal reflux disease) 09/29/2019  . Constipation 09/29/2019  . Foot pain, right 08/04/2019  . BPPV (benign paroxysmal positional vertigo) 12/04/2018  . Midsternal chest pain 09/16/2018  . Pulmonary embolism ([place]) 07/22/2018  . Anxiety and depression 07/13/2018  . Arm swelling 07/06/2018  . DOE (dyspnea on exertion) 06/30/2018  . HTN (hypertension) 10/29/2017  . Reactive depression (situational) 10/29/2017  . Pruritic condition 09/25/2016  . Urticaria 09/25/2016  . Anemia 04/23/2016  . Term pregnancy 03/20/2016  . Well adult exam 05/06/2014  . Otitis media 12/13/2011  . Fatigue 12/13/2011  . B12 deficiency 12/13/2011  . Vitamin D deficiency 12/13/2011  . Palpitations 08/06/2010  . Syncope and collapse 08/06/2010  . EDEMA 10/14/2007  . UTI 07/21/2007  . LOW BACK PAIN 07/21/2007  . ELEVATED BP 07/21/2007  . Morbid (severe) obesity due to excess calories ([place]) 11/15/2006  . Migraines 11/15/2006    Current Outpatient Medications  Medication Sig Dispense Refill  . acetaminophen (TYLENOL) 500 MG tablet Take 1,000 mg by mouth every 6 (six) hours as needed for mild pain.    Marland Kitchen albuterol (VENTOLIN HFA) 108 (90 Base) MCG/ACT inhaler Inhale 1-2 puffs into the lungs every 6 (six) hours as needed for wheezing or shortness of breath. 18 g 1  . budesonide-formoterol (SYMBICORT) 160-4.5 MCG/ACT inhaler Inhale 2 puffs into the lungs 2 (two) times daily. 3 Inhaler 3  . cetirizine (ZYRTEC) 10 MG tablet Take 10 mg by mouth daily as needed for allergies.    . Cholecalciferol (VITAMIN D3) 1.25 MG (50000 UT) CAPS Take 1 capsule by mouth once a week. 12 capsule 3  . clonazePAM (KLONOPIN) 0.5 MG disintegrating tablet Take 1-2 tablets (0.5-1 mg total) by mouth 2 (two) times daily as needed (panic attacks). 60 tablet 1  . Cyanocobalamin (VITAMIN  B-12) 1000 MCG SUBL Place 1 tablet (1,000 mcg total) under the tongue daily. 100 tablet 3  . diclofenac Sodium (VOLTAREN) 1 % GEL Apply 2 g topically 4 (four) times daily. 100 g 0  . doxycycline (VIBRA-TABS) 100 MG tablet Take 1 tablet (100 mg total) by mouth 2 (two) times daily. 20 tablet 0  . meclizine (ANTIVERT) 25 MG tablet Take 1 tablet (25 mg total) by mouth 3 (three) times daily as needed for dizziness. 30 tablet 0  . Multiple Vitamin (MULTIVITAMIN) capsule Take 1 capsule by mouth daily.    . pantoprazole (PROTONIX) 40 MG tablet Take 1 tablet (40 mg total) by mouth daily. 90 tablet 3  . rivaroxaban (XARELTO) 20 MG TABS tablet Take 1 tablet (20 mg total) by mouth daily with supper. 30 tablet 5  . senna-docusate (SENOKOT-S) 8.6-50 MG tablet Take 2 tablets by mouth daily as needed for mild constipation or moderate constipation. 100 tablet 6  . spironolactone (ALDACTONE) 25 MG tablet Take 1 tablet (25 mg total) by mouth daily. 90 tablet 3   No current facility-administered medications for this visit.    Allergies: Patient has no known allergies.  Past Medical History:  Diagnosis Date  . Constipation   . Edema   . Headache(784.0)   . Morbid obesity ([place])   . Pulmonary embolism [facility])     Past Surgical History:  Procedure Laterality Date  . BREAST REDUCTION SURGERY    . BREAST SURGERY  2005   reduction  . CESAREAN SECTION N/A 03/20/2016  Procedure: CESAREAN SECTION;  Surgeon: Christophe Louis, MD;  Location: [place];  Service: Obstetrics;  Laterality: N/A;  . TUBAL LIGATION Bilateral 03/20/2016   Procedure: BILATERAL TUBAL LIGATION;  Surgeon: Christophe Louis, MD;  Location: [place];  Service: Obstetrics;  Laterality: Bilateral;    Family History  Problem Relation Age of Onset  . Deep vein thrombosis Father   . Diabetes Father   . Hypertension Mother   . Hypertension Other   . Diabetes Paternal Aunt   . Heart disease Maternal Grandmother   . Heart disease Maternal  Grandfather   . Diabetes Paternal Grandmother     Social History   Tobacco Use  . Smoking status: Never Smoker  . Smokeless tobacco: Never Used  Substance Use Topics  . Alcohol use: Yes    Comment: occasionally    Subjective:   I connected with MARQUIA COSTELLO on 11/29/19 at  2:15 PM EDT by a video enabled telemedicine application and verified that I am speaking with the correct person using two identifiers.   I discussed the limitations of evaluation and management by telemedicine and the availability of in person appointments. The patient expressed understanding and agreed to proceed. Provider in office/ patient is at home; provider and patient are only 2 people on video call.   Cough/ congestion x 1-2 weeks; prone to bronchitis; feels like "everything settles in my chest" when she gets sick; no chest pain or shortness of breath; takes Symbicort daily and using albuterol as needed; in the process of scheduling for COVID test;      Objective:  There were no vitals filed for this visit.  General: Well developed, well nourished, in no acute distress  Head: Normocephalic and atraumatic  Lungs: Respirations unlabored;  Neurologic: Alert and oriented; speech intact; face symmetrical;   Assessment:  1. Acute bronchitis, unspecified organism     Plan:  Rx for Doxycycline 100 mg bid x 10 days; plan for COVID test if no improvement in 24-48 hours;  No follow-ups on file.  No orders of the defined types were placed in this encounter.   Requested Prescriptions   Signed Prescriptions Disp Refills  . doxycycline (VIBRA-TABS) 100 MG tablet 20 tablet 0    Sig: Take 1 tablet (100 mg total) by mouth 2 (two) times daily.

## 2019-12-01 ENCOUNTER — Other Ambulatory Visit: Payer: Self-pay | Admitting: Internal Medicine

## 2019-12-01 ENCOUNTER — Other Ambulatory Visit: Payer: Self-pay

## 2019-12-01 ENCOUNTER — Other Ambulatory Visit: Payer: 59

## 2019-12-01 DIAGNOSIS — Z20822 Contact with and (suspected) exposure to covid-19: Secondary | ICD-10-CM

## 2019-12-04 LAB — NOVEL CORONAVIRUS, NAA: SARS-CoV-2, NAA: NOT DETECTED

## 2019-12-09 ENCOUNTER — Other Ambulatory Visit: Payer: 59

## 2019-12-09 DIAGNOSIS — Z7901 Long term (current) use of anticoagulants: Secondary | ICD-10-CM | POA: Insufficient documentation

## 2019-12-09 DIAGNOSIS — Z8249 Family history of ischemic heart disease and other diseases of the circulatory system: Secondary | ICD-10-CM | POA: Insufficient documentation

## 2019-12-30 ENCOUNTER — Other Ambulatory Visit: Payer: Self-pay

## 2019-12-30 ENCOUNTER — Inpatient Hospital Stay (HOSPITAL_BASED_OUTPATIENT_CLINIC_OR_DEPARTMENT_OTHER)
Admission: EM | Admit: 2019-12-30 | Discharge: 2020-01-04 | DRG: 392 | Disposition: A | Discharge status: Home / Self Care | Payer: 59 | Attending: Physician Assistant | Admitting: Physician Assistant

## 2019-12-30 ENCOUNTER — Telehealth: Payer: Self-pay | Admitting: Internal Medicine

## 2019-12-30 ENCOUNTER — Encounter (HOSPITAL_BASED_OUTPATIENT_CLINIC_OR_DEPARTMENT_OTHER): Payer: Self-pay | Admitting: *Deleted

## 2019-12-30 ENCOUNTER — Emergency Department (HOSPITAL_BASED_OUTPATIENT_CLINIC_OR_DEPARTMENT_OTHER): Payer: 59

## 2019-12-30 DIAGNOSIS — Z8249 Family history of ischemic heart disease and other diseases of the circulatory system: Secondary | ICD-10-CM

## 2019-12-30 DIAGNOSIS — Z20822 Contact with and (suspected) exposure to covid-19: Secondary | ICD-10-CM | POA: Diagnosis present

## 2019-12-30 DIAGNOSIS — Z6841 Body Mass Index (BMI) 40.0 and over, adult: Secondary | ICD-10-CM

## 2019-12-30 DIAGNOSIS — Z7951 Long term (current) use of inhaled steroids: Secondary | ICD-10-CM

## 2019-12-30 DIAGNOSIS — K5780 Diverticulitis of intestine, part unspecified, with perforation and abscess without bleeding: Secondary | ICD-10-CM | POA: Diagnosis not present

## 2019-12-30 DIAGNOSIS — Z7901 Long term (current) use of anticoagulants: Secondary | ICD-10-CM

## 2019-12-30 DIAGNOSIS — K5720 Diverticulitis of large intestine with perforation and abscess without bleeding: Secondary | ICD-10-CM | POA: Diagnosis not present

## 2019-12-30 DIAGNOSIS — N730 Acute parametritis and pelvic cellulitis: Secondary | ICD-10-CM

## 2019-12-30 DIAGNOSIS — I10 Essential (primary) hypertension: Secondary | ICD-10-CM | POA: Diagnosis present

## 2019-12-30 DIAGNOSIS — Z86711 Personal history of pulmonary embolism: Secondary | ICD-10-CM

## 2019-12-30 DIAGNOSIS — E876 Hypokalemia: Secondary | ICD-10-CM | POA: Diagnosis present

## 2019-12-30 DIAGNOSIS — J45909 Unspecified asthma, uncomplicated: Secondary | ICD-10-CM | POA: Diagnosis present

## 2019-12-30 DIAGNOSIS — Z79899 Other long term (current) drug therapy: Secondary | ICD-10-CM

## 2019-12-30 DIAGNOSIS — E785 Hyperlipidemia, unspecified: Secondary | ICD-10-CM | POA: Diagnosis present

## 2019-12-30 LAB — CBC WITH DIFFERENTIAL/PLATELET
Abs Immature Granulocytes: 0.03 10*3/uL (ref 0.00–0.07)
Basophils Absolute: 0 10*3/uL (ref 0.0–0.1)
Basophils Relative: 0 %
Eosinophils Absolute: 0 10*3/uL (ref 0.0–0.5)
Eosinophils Relative: 0 %
HCT: 38.7 % (ref 36.0–46.0)
Hemoglobin: 12.7 g/dL (ref 12.0–15.0)
Immature Granulocytes: 0 %
Lymphocytes Relative: 12 %
Lymphs Abs: 1.4 10*3/uL (ref 0.7–4.0)
MCH: 29.8 pg (ref 26.0–34.0)
MCHC: 32.8 g/dL (ref 30.0–36.0)
MCV: 90.8 fL (ref 80.0–100.0)
Monocytes Absolute: 0.9 10*3/uL (ref 0.1–1.0)
Monocytes Relative: 8 %
Neutro Abs: 8.8 10*3/uL — ABNORMAL HIGH (ref 1.7–7.7)
Neutrophils Relative %: 80 %
Platelets: 330 10*3/uL (ref 150–400)
RBC: 4.26 MIL/uL (ref 3.87–5.11)
RDW: 13.5 % (ref 11.5–15.5)
WBC: 11.1 10*3/uL — ABNORMAL HIGH (ref 4.0–10.5)
nRBC: 0 % (ref 0.0–0.2)

## 2019-12-30 LAB — LIPASE, BLOOD: Lipase: 26 U/L (ref 11–51)

## 2019-12-30 LAB — COMPREHENSIVE METABOLIC PANEL
ALT: 14 U/L (ref 0–44)
AST: 20 U/L (ref 15–41)
Albumin: 3.6 g/dL (ref 3.5–5.0)
Alkaline Phosphatase: 40 U/L (ref 38–126)
Anion gap: 9 (ref 5–15)
BUN: 9 mg/dL (ref 6–20)
CO2: 27 mmol/L (ref 22–32)
Calcium: 8.9 mg/dL (ref 8.9–10.3)
Chloride: 100 mmol/L (ref 98–111)
Creatinine, Ser: 0.99 mg/dL (ref 0.44–1.00)
GFR, Estimated: >60 mL/min (ref >60)
Glucose, Bld: 118 mg/dL — ABNORMAL HIGH (ref 70–99)
Potassium: 3.1 mmol/L — ABNORMAL LOW (ref 3.5–5.1)
Sodium: 136 mmol/L (ref 135–145)
Total Bilirubin: 2.2 mg/dL — ABNORMAL HIGH (ref 0.3–1.2)
Total Protein: 7 g/dL (ref 6.5–8.1)

## 2019-12-30 LAB — WET PREP, GENITAL
Sperm: NONE SEEN
Trich, Wet Prep: NONE SEEN
Yeast Wet Prep HPF POC: NONE SEEN

## 2019-12-30 MED ORDER — KETOROLAC TROMETHAMINE 30 MG/ML IJ SOLN
30.0000 mg | Freq: Once | INTRAMUSCULAR | Status: AC
  Administered 2019-12-30: 30 mg via INTRAVENOUS
  Filled 2019-12-30: qty 1

## 2019-12-30 MED ORDER — SODIUM CHLORIDE 0.9 % IV BOLUS
1000.0000 mL | Freq: Once | INTRAVENOUS | Status: AC
  Administered 2019-12-30: 1000 mL via INTRAVENOUS

## 2019-12-30 MED ORDER — POTASSIUM CHLORIDE CRYS ER 20 MEQ PO TBCR
40.0000 meq | EXTENDED_RELEASE_TABLET | Freq: Once | ORAL | Status: AC
  Administered 2019-12-30: 40 meq via ORAL
  Filled 2019-12-30: qty 2

## 2019-12-30 MED ORDER — PROMETHAZINE HCL 25 MG/ML IJ SOLN: 25.0000 mg | Freq: Once | INTRAMUSCULAR | Status: DC

## 2019-12-30 MED ORDER — DOXYCYCLINE HYCLATE 100 MG PO TABS
100.0000 mg | ORAL_TABLET | Freq: Two times a day (BID) | ORAL | 0 refills | Status: DC
Start: 2019-12-30 — End: 2020-01-04

## 2019-12-30 MED ORDER — SODIUM CHLORIDE 0.9 % IV SOLN
1.0000 g | Freq: Once | INTRAVENOUS | Status: AC
  Administered 2019-12-31: 1 g via INTRAVENOUS
  Filled 2019-12-30: qty 10

## 2019-12-30 NOTE — ED Notes (Signed)
Patient aware of need for urine specimen. Provided water.

## 2019-12-30 NOTE — Telephone Encounter (Signed)
Patient stated she spoke with team health yesterday and they told her to go to ED but didn't go and took some medication and it made her feel better so she didn't go. Patient said pain is getting worse again and it causes her to throw up, bad abdominal pain that feels like she is starting her period but has not actually started. She cant stand up all the way because pain is so bad.  Patient can be contacted at [phone]

## 2019-12-30 NOTE — Telephone Encounter (Signed)
On 10/13 at 1933, pt contacted telehealth with cc: "abdominal pain -severe only in abdm; caller states she is having bad adominal pain.  It is causing her to vomit pain is so bad she states she can barely walk."  She was advised to go to ED at that time, verbalized understanding, & noted that she would comply.   Based on that documentation & patient's current complaints, she is again advised to go to ED.  Pt states she understands & that she will comply.

## 2019-12-30 NOTE — ED Provider Notes (Addendum)
[facility] Provider Note   CSN: [id number] Arrival date & time: 12/30/19  2047     History Chief Complaint  Patient presents with  . Abdominal Pain    Bridget [name] is a 44 y.o. female.  Bridget [name] is a 44 year old woman who presents to the ED with 1 day of intense lower abdominal pain.  She has a previous medical history significant for obesity.  She reports that she first noticed the pain yesterday when she was working at home when she was hit by a sudden sharp lower abdominal pain on both sides of her abdomen.  She reports the pain came totally out of the blue without warning.  Following this abdominal pain, she had multiple episodes of NB/NB emesis.  Since this episode of abdominal pain and emesis, her symptoms have had some mild improvement but have not yet totally resolved.  Overnight, she did try taking Tylenol and use heating pad to the lower abdomen without any significant benefit.  She has presented to the ED for further assessment.  She has not associated these symptoms with eating or exercise.  She is currently sexually active but has not had intercourse lately.  She is not currently on birth control.  Her last menstrual period was about 1 month ago and she expects her menstrual bleeding to start soon.        Past Medical History:  Diagnosis Date  . Constipation   . Edema   . Headache(784.0)   . Morbid obesity ([place])   . Pulmonary embolism [facility])     Patient Active Problem List   Diagnosis Date Noted  . GERD (gastroesophageal reflux disease) 09/29/2019  . Constipation 09/29/2019  . Foot pain, right 08/04/2019  . BPPV (benign paroxysmal positional vertigo) 12/04/2018  . Midsternal chest pain 09/16/2018  . Pulmonary embolism ([place]) 07/22/2018  . Anxiety and depression 07/13/2018  . Arm swelling 07/06/2018  . DOE (dyspnea on exertion) 06/30/2018  . HTN (hypertension) 10/29/2017  . Reactive depression (situational) 10/29/2017  .  Pruritic condition 09/25/2016  . Urticaria 09/25/2016  . Anemia 04/23/2016  . Term pregnancy 03/20/2016  . Well adult exam 05/06/2014  . Otitis media 12/13/2011  . Fatigue 12/13/2011  . B12 deficiency 12/13/2011  . Vitamin D deficiency 12/13/2011  . Palpitations 08/06/2010  . Syncope and collapse 08/06/2010  . EDEMA 10/14/2007  . UTI 07/21/2007  . LOW BACK PAIN 07/21/2007  . ELEVATED BP 07/21/2007  . Morbid (severe) obesity due to excess calories ([place]) 11/15/2006  . Migraines 11/15/2006    Past Surgical History:  Procedure Laterality Date  . BREAST REDUCTION SURGERY    . BREAST SURGERY  2005   reduction  . CESAREAN SECTION N/A 03/20/2016   Procedure: CESAREAN SECTION;  Surgeon: Christophe Louis, MD;  Location: [facility];  Service: Obstetrics;  Laterality: N/A;  . TUBAL LIGATION Bilateral 03/20/2016   Procedure: BILATERAL TUBAL LIGATION;  Surgeon: Christophe Louis, MD;  Location: [facility];  Service: Obstetrics;  Laterality: Bilateral;     OB History    Gravida  7   Para  2   Term  2   Preterm      AB  5   Living  2     SAB  3   TAB  2   Ectopic      Multiple  0   Live Births  2           Family History  Problem Relation  Age of Onset  . Deep vein thrombosis Father   . Diabetes Father   . Hypertension Mother   . Hypertension Other   . Diabetes Paternal Aunt   . Heart disease Maternal Grandmother   . Heart disease Maternal Grandfather   . Diabetes Paternal Grandmother     Social History   Tobacco Use  . Smoking status: Never Smoker  . Smokeless tobacco: Never Used  Substance Use Topics  . Alcohol use: Yes    Comment: occasionally  . Drug use: No    Home Medications Prior to Admission medications   Medication Sig Start Date End Date Taking? Authorizing Provider  acetaminophen (TYLENOL) 500 MG tablet Take 1,000 mg by mouth every 6 (six) hours as needed for mild pain.    [provider]  albuterol (VENTOLIN HFA) 108 (90 Base)  MCG/ACT inhaler Inhale 1-2 puffs into the lungs every 6 (six) hours as needed for wheezing or shortness of breath. 07/02/19   Marrian Salvage, FNP  cetirizine (ZYRTEC) 10 MG tablet Take 10 mg by mouth daily as needed for allergies.    [provider]  Cholecalciferol (VITAMIN D3) 1.25 MG (50000 UT) CAPS Take 1 capsule by mouth once a week. 07/14/19   Plotnikov, Evie Lacks, MD  clonazePAM (KLONOPIN) 0.5 MG disintegrating tablet Take 1-2 tablets (0.5-1 mg total) by mouth 2 (two) times daily as needed (panic attacks). 09/29/19   Plotnikov, Evie Lacks, MD  Cyanocobalamin (VITAMIN B-12) 1000 MCG SUBL Place 1 tablet (1,000 mcg total) under the tongue daily. 08/04/19   Plotnikov, Evie Lacks, MD  diclofenac Sodium (VOLTAREN) 1 % GEL Apply 2 g topically 4 (four) times daily. 08/19/19   Hoyt Koch, MD  doxycycline (VIBRA-TABS) 100 MG tablet Take 1 tablet (100 mg total) by mouth 2 (two) times daily. 11/29/19   Marrian Salvage, FNP  meclizine (ANTIVERT) 25 MG tablet Take 1 tablet (25 mg total) by mouth 3 (three) times daily as needed for dizziness. 12/04/18   Hoyt Koch, MD  Multiple Vitamin (MULTIVITAMIN) capsule Take 1 capsule by mouth daily.    [provider]  pantoprazole (PROTONIX) 40 MG tablet Take 1 tablet (40 mg total) by mouth daily. 09/29/19   Plotnikov, Evie Lacks, MD  rivaroxaban (XARELTO) 20 MG TABS tablet Take 1 tablet (20 mg total) by mouth daily with supper. 07/13/19   Plotnikov, Evie Lacks, MD  senna-docusate (SENOKOT-S) 8.6-50 MG tablet Take 2 tablets by mouth daily as needed for mild constipation or moderate constipation. 09/29/19 09/28/20  Plotnikov, Evie Lacks, MD  spironolactone (ALDACTONE) 25 MG tablet Take 1 tablet (25 mg total) by mouth daily. 11/18/19   Nahser, Wonda Cheng, MD  SYMBICORT 160-4.5 MCG/ACT inhaler Inhale 2 puffs by mouth twice daily 12/03/19   Plotnikov, Evie Lacks, MD    Allergies    Patient has no known allergies.  Review of Systems     Review of Systems  Constitutional: Negative for chills and fever.  HENT: Negative for congestion, rhinorrhea and sore throat.   Respiratory: Negative for cough, shortness of breath and wheezing.   Cardiovascular: Negative for chest pain and palpitations.  Gastrointestinal: Positive for nausea and vomiting. Negative for abdominal distention, constipation and diarrhea.  Genitourinary: Negative for dysuria.  Musculoskeletal: Negative for arthralgias and myalgias.  Skin: Negative for rash and wound.  Neurological: Positive for headaches. Negative for tremors.  Hematological: Negative for adenopathy.  Psychiatric/Behavioral: Negative for behavioral problems and confusion.    Physical Exam Updated Vital  Signs BP (!) 136/100   Pulse (!) 131 Comment: Pt states she rushed into the ED from the parking lot. She was nervous about walking in a dark parking lot.  Temp 98.4 F (36.9 C) (Oral)   Resp 18   Ht 5\' 4"  (1.626 m)   Wt (!) 148 kg   LMP 11/30/2019   SpO2 100%   BMI 56.01 kg/m   Physical Exam Constitutional:      General: She is not in acute distress.    Appearance: She is well-developed. She is obese. She is not ill-appearing, toxic-appearing or diaphoretic.  HENT:     Head: Normocephalic and atraumatic.     Mouth/Throat:     Mouth: Mucous membranes are moist.     Pharynx: Oropharynx is clear.  Eyes:     Extraocular Movements: Extraocular movements intact.     Pupils: Pupils are equal, round, and reactive to light.  Cardiovascular:     Rate and Rhythm: Normal rate and regular rhythm.     Heart sounds: Normal heart sounds. No murmur heard.   Pulmonary:     Effort: Pulmonary effort is normal. No respiratory distress.     Breath sounds: Normal breath sounds. No wheezing or rales.  Abdominal:     General: Abdomen is protuberant. Bowel sounds are normal.     Palpations: Abdomen is soft.     Tenderness: There is abdominal tenderness in the right lower quadrant and left lower  quadrant. There is guarding. There is no right CVA tenderness or left CVA tenderness. Negative signs include Murphy's sign and McBurney's sign.     Hernia: No hernia is present.  Skin:    General: Skin is warm.     Capillary Refill: Capillary refill takes less than 2 seconds.  Neurological:     General: No focal deficit present.     Mental Status: She is alert.  Psychiatric:        Mood and Affect: Mood normal.     ED Results / Procedures / Treatments   Labs (all labs ordered are listed, but only abnormal results are displayed) Labs Reviewed  URINALYSIS, ROUTINE W REFLEX MICROSCOPIC  PREGNANCY, URINE  COMPREHENSIVE METABOLIC PANEL  CBC WITH DIFFERENTIAL/PLATELET  LIPASE, BLOOD    EKG None  Radiology No results found.  Procedures Procedures (including critical care time)  Medications Ordered in ED Medications  sodium chloride 0.9 % bolus 1,000 mL (has no administration in time range)  promethazine (PHENERGAN) injection 25 mg (has no administration in time range)    ED Course  I have reviewed the triage vital signs and the nursing notes.  Pertinent labs & imaging results that were available during my care of the patient were reviewed by me and considered in my medical decision making (see chart for details).    MDM Rules/Calculators/A&P                          Ms. Parrott is a 44 year old woman who presents to the ED with 1 day of intense lower abdominal pain.  She has a previous medical history significant for obesity.  Overall, low suspicion for gallbladder or appendiceal pathology or pancreatitis.  The differential at this time certainly includes the onset of menstrual cramping, gastroenteritis.  Lower suspicion for ovarian pathology based on bilateral nature.  She denies any vaginal discharge and no recent sexual encounters therefore lower suspicion for PID.  Endometriosis or adenomyosis are also possible but unlikely  given her lack of history of recurrent painful  periods.  We will start work-up with basic labs and assess for possible UTI, liver function.  Initial labs are notable for a mildly elevated WBC without other abnormalities.  On repeat exam, there is continued concern for possible pelvic infection.  We ultimately performed a pelvic exam which revealed significant cervical motion tenderness.  We begin empiric treatment for PID with ceftriaxone and will send home with a 14-day course of doxycycline.  Due to persistent patient concerned about possible ovarian etiology, will move forward with a CT abdomen pelvis before sending home.   Final Clinical Impression(s) / ED Diagnoses Final diagnoses:  None    Rx / DC Orders ED Discharge Orders    None       Matilde Haymaker, MD 12/30/19 2313    Matilde Haymaker, MD 12/30/19 2344    Drenda Freeze, MD 01/03/20 [phone]

## 2019-12-30 NOTE — Telephone Encounter (Signed)
error 

## 2019-12-30 NOTE — ED Triage Notes (Signed)
Abdominal pain x 2 days. Vomited x 6 yesterday.

## 2019-12-30 NOTE — Discharge Instructions (Addendum)
Diverticulitis  Diverticulitis is when small pockets in your large intestine (colon) get infected or swollen. This causes stomach pain and watery poop (diarrhea). These pouches are called diverticula. They form in people who have a condition called diverticulosis. Follow these instructions at home: Medicines  Take over-the-counter and prescription medicines only as told by your doctor. These include: ? Antibiotics. ? Pain medicines. ? Fiber pills. ? Probiotics. ? Stool softeners.  Do not drive or use heavy machinery while taking prescription pain medicine.  If you were prescribed an antibiotic, take it as told. Do not stop taking it even if you feel better. General instructions   Follow a diet as told by your doctor.  When you feel better, your doctor may tell you to change your diet. You may need to eat a lot of fiber. Fiber makes it easier to poop (have bowel movements). Healthy foods with fiber include: ? Berries. ? Beans. ? Lentils. ? Green vegetables.  Exercise 3 or more times a week. Aim for 30 minutes each time. Exercise enough to sweat and make your heart beat faster.  Keep all follow-up visits as told. This is important. You may need to have an exam of the large intestine. This is called a colonoscopy. Contact a doctor if:  Your pain does not get better.  You have a hard time eating or drinking.  You are not pooping like normal. Get help right away if:  Your pain gets worse.  Your problems do not get better.  Your problems get worse very fast.  You have a fever.  You throw up (vomit) more than one time.  You have poop that is: ? Bloody. ? Black. ? Tarry. Summary  Diverticulitis is when small pockets in your large intestine (colon) get infected or swollen.  Take medicines only as told by your doctor.  Follow a diet as told by your doctor. This information is not intended to replace advice given to you by your health care provider. Make sure you  discuss any questions you have with your health care provider. Document Revised: 02/14/2017 Document Reviewed: 03/21/2016 Elsevier Patient Education  2020 Elsevier Inc.   Low-Fiber Eating Plan Fiber is found in fruits, vegetables, whole grains, and beans. Eating a diet low in fiber helps to reduce how often you have bowel movements and how much you produce during a bowel movement. A low-fiber eating plan may help your digestive system heal if:  You have certain conditions, such as Crohn's disease or diverticulitis.  You recently had radiation therapy on your pelvis or bowel.  You recently had intestinal surgery.  You have a new surgical opening in your abdomen (colostomy or ileostomy).  Your intestine is narrowed (stricture). Your health care provider will determine how long you need to stay on this diet. Your health care provider may recommend that you work with a diet and nutrition specialist (dietitian). What are tips for following this plan? General guidelines  Follow recommendations from your dietitian about how much fiber you should have each day.  Most people on this eating plan should try to eat less than 10 grams (g) of fiber each day. Your daily fiber goal is _________________ g.  Take vitamin and mineral supplements as told by your health care provider or dietitian. Chewable or liquid forms are best when on this eating plan. Reading food labels  Check food labels for the amount of dietary fiber.  Choose foods that have less than 2 grams of fiber in one serving.   Cooking  Use white flour and other allowed grains for baking and cooking.  Cook meat using methods that keep it tender, such as braising or poaching.  Cook eggs until the yolk is completely solid.  Cook with healthy oils, such as olive oil or canola oil. Meal planning   Eat 5-6 small meals throughout the day instead of 3 large meals.  If you are lactose intolerant: ? Choose low-lactose dairy  foods. ? Do not eat dairy foods, if told by your dietitian.  Limit fat and oils to less than 8 teaspoons a day.  Eat small portions of desserts. What foods are allowed? The items listed below may not be a complete list. Talk with your dietitian about what dietary choices are best for you. Grains All bread and crackers made with white flour. Waffles, pancakes, and French toast. Bagels. Pretzels. Melba toast, zwieback, and matzoh. Cooked and dried cereals that do not contain whole grains, added fiber, seeds, or dried fruit. Cornmeal. [place]. Hot and cold cereals made with refined corn, wheat, rice, or oats. Plain pasta and noodles. White rice. Vegetables Well-cooked or canned vegetables without skin, seeds, or stems. Cooked potatoes without skins. Vegetable juice. Fruits Soft-cooked or canned fruits without skin and seeds. Peeled ripe banana. Applesauce. Fruit juice without pulp. Meats and other protein foods Ground meat. Tender cuts of meat or poultry. Eggs. Fish, seafood, and shellfish. Smooth nut butters. Tofu. Dairy All milk products and drinks. Lactose-free milks, including rice, soy, and almond milks. Yogurt without fruit, nuts, chocolate, or granola mix-ins. Sour cream. Cottage cheese. Cheese. Beverages Decaf coffee. Fruit and vegetable juices or smoothies (in small amounts, with no pulp or skins, and with fruits from allowed list). Sports drinks. Herbal tea. Fats and oils Olive oil, canola oil, sunflower oil, flaxseed oil, and grapeseed oil. Mayonnaise. Cream cheese. Margarine. Butter. Sweets and desserts Plain cakes and cookies. Cream pies and pies made with allowed fruits. Pudding. Custard. Fruit gelatin. Sherbet. Popsicles. Ice cream without nuts. Plain hard candy. Honey. Jelly. Molasses. Syrups, including chocolate syrup. Chocolate. Marshmallows. Gumdrops. Seasoning and other foods Bouillon. Broth. Cream soups made from allowed foods. Strained soup. Casseroles made with allowed  foods. Ketchup. Mild mustard. Mild salad dressings. Plain gravies. Vinegar. Spices in moderation. Salt. Sugar. What foods are not allowed? The items listed below may not be a complete list. Talk with your dietitian about what dietary choices are best for you. Grains Whole wheat and whole grain breads and crackers. Multigrain breads and crackers. Rye bread. Whole grain or multigrain cereals. Cereals with nuts, raisins, or coconut. Bran. Coarse wheat cereals. Granola. High-fiber cereals. Cornmeal or corn bread. Whole grain pasta. Wild or brown rice. Quinoa. Popcorn. Buckwheat. Wheat germ. Vegetables Potato skins. Raw or undercooked vegetables. All beans and bean sprouts. Cooked greens. Corn. Peas. Cabbage. Beets. Broccoli. Brussels sprouts. Cauliflower. Mushrooms. Onions. Peppers. Parsnips. Okra. Sauerkraut. Fruit Raw or dried fruit. Berries. Fruit juice with pulp. Prune juice. Meats and other protein foods Tough, fibrous meats with gristle. Fatty meat. Poultry with skin. Fried meat, poultry, or fish. Deli or lunch meats. Sausage, bacon, and hot dogs. Nuts and chunky nut butter. Dried peas, beans, and lentils. Dairy Yogurt with fruit, nuts, chocolate, or granola mix-ins. Beverages Caffeinated coffee and teas. Fats and oils Avocado. Coconut. Sweets and desserts Desserts, cookies, or candies that contain nuts or coconut. Dried fruit. Jams and preserves with seeds. Marmalade. Any dessert made with fruits or grains that are not allowed. Seasoning and other foods Corn tortilla chips.   Soups made with vegetables or grains that are not allowed. Relish. Horseradish. Pickles. Olives. Summary  Most people on a low-fiber eating plan should eat less than 10 grams of fiber a day. Follow recommendations from your dietitian about how much fiber you should have each day.  Always check food labels to see the dietary fiber content of packaged foods. In general, a low-fiber food will have fewer than 2 grams of  fiber per serving.  In general, try to avoid whole grains, raw fruits and vegetables, dried fruit, tough cuts of meat, nuts, and seeds.  Take a vitamin and mineral supplement as told by your health care provider or dietitian. This information is not intended to replace advice given to you by your health care provider. Make sure you discuss any questions you have with your health care provider. Document Revised: 06/26/2018 Document Reviewed: 05/07/2016 Elsevier Patient Education  2020 Elsevier Inc.   High-Fiber Diet Fiber, also called dietary fiber, is a type of carbohydrate that is found in fruits, vegetables, whole grains, and beans. A high-fiber diet can have many health benefits. Your health care provider may recommend a high-fiber diet to help:  Prevent constipation. Fiber can make your bowel movements more regular.  Lower your cholesterol.  Relieve the following conditions: ? Swelling of veins in the anus (hemorrhoids). ? Swelling and irritation (inflammation) of specific areas of the digestive tract (uncomplicated diverticulosis). ? A problem of the large intestine (colon) that sometimes causes pain and diarrhea (irritable bowel syndrome, IBS).  Prevent overeating as part of a weight-loss plan.  Prevent heart disease, type 2 diabetes, and certain cancers. What is my plan? The recommended daily fiber intake in grams (g) includes:  38 g for men age 50 or younger.  30 g for men over age 50.  25 g for women age 50 or younger.  21 g for women over age 50. You can get the recommended daily intake of dietary fiber by:  Eating a variety of fruits, vegetables, grains, and beans.  Taking a fiber supplement, if it is not possible to get enough fiber through your diet. What do I need to know about a high-fiber diet?  It is better to get fiber through food sources rather than from fiber supplements. There is not a lot of research about how effective supplements are.  Always  check the fiber content on the nutrition facts label of any prepackaged food. Look for foods that contain 5 g of fiber or more per serving.  Talk with a diet and nutrition specialist (dietitian) if you have questions about specific foods that are recommended or not recommended for your medical condition, especially if those foods are not listed below.  Gradually increase how much fiber you consume. If you increase your intake of dietary fiber too quickly, you may have bloating, cramping, or gas.  Drink plenty of water. Water helps you to digest fiber. What are tips for following this plan?  Eat a wide variety of high-fiber foods.  Make sure that half of the grains that you eat each day are whole grains.  Eat breads and cereals that are made with whole-grain flour instead of refined flour or white flour.  Eat brown rice, bulgur wheat, or millet instead of white rice.  Start the day with a breakfast that is high in fiber, such as a cereal that contains 5 g of fiber or more per serving.  Use beans in place of meat in soups, salads, and pasta dishes.    Eat high-fiber snacks, such as berries, raw vegetables, nuts, and popcorn.  Choose whole fruits and vegetables instead of processed forms like juice or sauce. What foods can I eat?  Fruits Berries. Pears. Apples. Oranges. Avocado. Prunes and raisins. Dried figs. Vegetables Sweet potatoes. Spinach. Kale. Artichokes. Cabbage. Broccoli. Cauliflower. Green peas. Carrots. Squash. Grains Whole-grain breads. Multigrain cereal. Oats and oatmeal. Brown rice. Barley. Bulgur wheat. Millet. Quinoa. Bran muffins. Popcorn. Rye wafer crackers. Meats and other proteins Navy, kidney, and pinto beans. Soybeans. Split peas. Lentils. Nuts and seeds. Dairy Fiber-fortified yogurt. Beverages Fiber-fortified soy milk. Fiber-fortified orange juice. Other foods Fiber bars. The items listed above may not be a complete list of recommended foods and beverages.  Contact a dietitian for more options. What foods are not recommended? Fruits Fruit juice. Cooked, strained fruit. Vegetables Fried potatoes. Canned vegetables. Well-cooked vegetables. Grains White bread. Pasta made with refined flour. White rice. Meats and other proteins Fatty cuts of meat. Fried chicken or fried fish. Dairy Milk. Yogurt. Cream cheese. Sour cream. Fats and oils Butters. Beverages Soft drinks. Other foods Cakes and pastries. The items listed above may not be a complete list of foods and beverages to avoid. Contact a dietitian for more information. Summary  Fiber is a type of carbohydrate. It is found in fruits, vegetables, whole grains, and beans.  There are many health benefits of eating a high-fiber diet, such as preventing constipation, lowering blood cholesterol, helping with weight loss, and reducing your risk of heart disease, diabetes, and certain cancers.  Gradually increase your intake of fiber. Increasing too fast can result in cramping, bloating, and gas. Drink plenty of water while you increase your fiber.  The best sources of fiber include whole fruits and vegetables, whole grains, nuts, seeds, and beans. This information is not intended to replace advice given to you by your health care provider. Make sure you discuss any questions you have with your health care provider. Document Revised: 01/06/2017 Document Reviewed: 01/06/2017 Elsevier Patient Education  2020 Elsevier Inc.  

## 2019-12-31 DIAGNOSIS — K5720 Diverticulitis of large intestine with perforation and abscess without bleeding: Secondary | ICD-10-CM | POA: Diagnosis present

## 2019-12-31 DIAGNOSIS — Z7951 Long term (current) use of inhaled steroids: Secondary | ICD-10-CM | POA: Diagnosis not present

## 2019-12-31 DIAGNOSIS — E876 Hypokalemia: Secondary | ICD-10-CM | POA: Diagnosis present

## 2019-12-31 DIAGNOSIS — E785 Hyperlipidemia, unspecified: Secondary | ICD-10-CM | POA: Diagnosis present

## 2019-12-31 DIAGNOSIS — Z86711 Personal history of pulmonary embolism: Secondary | ICD-10-CM | POA: Diagnosis not present

## 2019-12-31 DIAGNOSIS — I10 Essential (primary) hypertension: Secondary | ICD-10-CM | POA: Diagnosis present

## 2019-12-31 DIAGNOSIS — Z79899 Other long term (current) drug therapy: Secondary | ICD-10-CM | POA: Diagnosis not present

## 2019-12-31 DIAGNOSIS — Z20822 Contact with and (suspected) exposure to covid-19: Secondary | ICD-10-CM | POA: Diagnosis present

## 2019-12-31 DIAGNOSIS — K5780 Diverticulitis of intestine, part unspecified, with perforation and abscess without bleeding: Secondary | ICD-10-CM | POA: Diagnosis present

## 2019-12-31 DIAGNOSIS — J45909 Unspecified asthma, uncomplicated: Secondary | ICD-10-CM | POA: Diagnosis present

## 2019-12-31 DIAGNOSIS — Z8249 Family history of ischemic heart disease and other diseases of the circulatory system: Secondary | ICD-10-CM | POA: Diagnosis not present

## 2019-12-31 DIAGNOSIS — Z6841 Body Mass Index (BMI) 40.0 and over, adult: Secondary | ICD-10-CM | POA: Diagnosis not present

## 2019-12-31 DIAGNOSIS — Z7901 Long term (current) use of anticoagulants: Secondary | ICD-10-CM | POA: Diagnosis not present

## 2019-12-31 LAB — HEPATIC FUNCTION PANEL
ALT: 13 U/L (ref 0–44)
AST: 27 U/L (ref 15–41)
Albumin: 3.1 g/dL — ABNORMAL LOW (ref 3.5–5.0)
Alkaline Phosphatase: 42 U/L (ref 38–126)
Bilirubin, Direct: 0.4 mg/dL — ABNORMAL HIGH (ref 0.0–0.2)
Indirect Bilirubin: 1.8 mg/dL — ABNORMAL HIGH (ref 0.3–0.9)
Total Bilirubin: 2.2 mg/dL — ABNORMAL HIGH (ref 0.3–1.2)
Total Protein: 6.4 g/dL — ABNORMAL LOW (ref 6.5–8.1)

## 2019-12-31 LAB — CBC
HCT: 34.3 % — ABNORMAL LOW (ref 36.0–46.0)
Hemoglobin: 11.6 g/dL — ABNORMAL LOW (ref 12.0–15.0)
MCH: 30.9 pg (ref 26.0–34.0)
MCHC: 33.8 g/dL (ref 30.0–36.0)
MCV: 91.2 fL (ref 80.0–100.0)
Platelets: 294 10*3/uL (ref 150–400)
RBC: 3.76 MIL/uL — ABNORMAL LOW (ref 3.87–5.11)
RDW: 13.7 % (ref 11.5–15.5)
WBC: 9.5 10*3/uL (ref 4.0–10.5)
nRBC: 0 % (ref 0.0–0.2)

## 2019-12-31 LAB — RESPIRATORY PANEL BY RT PCR (FLU A&B, COVID)
Influenza A by PCR: NEGATIVE
Influenza B by PCR: NEGATIVE
SARS Coronavirus 2 by RT PCR: NEGATIVE

## 2019-12-31 LAB — URINALYSIS, ROUTINE W REFLEX MICROSCOPIC
Bilirubin Urine: NEGATIVE
Glucose, UA: NEGATIVE mg/dL
Ketones, ur: NEGATIVE mg/dL
Nitrite: NEGATIVE
Protein, ur: NEGATIVE mg/dL
Specific Gravity, Urine: >1.03 — ABNORMAL HIGH (ref 1.005–1.030)
pH: 5.5 (ref 5.0–8.0)

## 2019-12-31 LAB — URINALYSIS, MICROSCOPIC (REFLEX): RBC / HPF: >50 RBC/hpf (ref 0–5)

## 2019-12-31 LAB — BASIC METABOLIC PANEL
Anion gap: 9 (ref 5–15)
BUN: 10 mg/dL (ref 6–20)
CO2: 24 mmol/L (ref 22–32)
Calcium: 8.3 mg/dL — ABNORMAL LOW (ref 8.9–10.3)
Chloride: 105 mmol/L (ref 98–111)
Creatinine, Ser: 0.71 mg/dL (ref 0.44–1.00)
GFR, Estimated: >60 mL/min (ref >60)
Glucose, Bld: 117 mg/dL — ABNORMAL HIGH (ref 70–99)
Potassium: 3.8 mmol/L (ref 3.5–5.1)
Sodium: 138 mmol/L (ref 135–145)

## 2019-12-31 LAB — PREGNANCY, URINE: Preg Test, Ur: NEGATIVE

## 2019-12-31 LAB — C-REACTIVE PROTEIN: CRP: 15.1 mg/dL — ABNORMAL HIGH (ref <1.0)

## 2019-12-31 MED ORDER — FENTANYL CITRATE (PF) 100 MCG/2ML IJ SOLN
100.0000 ug | INTRAMUSCULAR | Status: DC | PRN
  Administered 2019-12-31: 100 ug via INTRAVENOUS
  Filled 2019-12-31: qty 2

## 2019-12-31 MED ORDER — ALBUTEROL SULFATE HFA 108 (90 BASE) MCG/ACT IN AERS
1.0000 | INHALATION_SPRAY | Freq: Four times a day (QID) | RESPIRATORY_TRACT | Status: DC | PRN
  Administered 2019-12-31: 2 via RESPIRATORY_TRACT
  Filled 2019-12-31 (×2): qty 6.7

## 2019-12-31 MED ORDER — PIPERACILLIN-TAZOBACTAM 3.375 G IVPB
3.3750 g | Freq: Three times a day (TID) | INTRAVENOUS | Status: DC
  Administered 2019-12-31 – 2020-01-03 (×9): 3.375 g via INTRAVENOUS
  Filled 2019-12-31 (×9): qty 50

## 2019-12-31 MED ORDER — MOMETASONE FURO-FORMOTEROL FUM 200-5 MCG/ACT IN AERO
2.0000 | INHALATION_SPRAY | Freq: Two times a day (BID) | RESPIRATORY_TRACT | Status: DC
  Filled 2019-12-31: qty 8.8

## 2019-12-31 MED ORDER — PANTOPRAZOLE SODIUM 40 MG PO TBEC
40.0000 mg | DELAYED_RELEASE_TABLET | Freq: Every day | ORAL | Status: DC
  Administered 2019-12-31 – 2020-01-04 (×5): 40 mg via ORAL
  Filled 2019-12-31 (×5): qty 1

## 2019-12-31 MED ORDER — IOHEXOL 300 MG/ML  SOLN
100.0000 mL | Freq: Once | INTRAMUSCULAR | Status: AC | PRN
  Administered 2019-12-31: 100 mL via INTRAVENOUS

## 2019-12-31 MED ORDER — ENOXAPARIN SODIUM 40 MG/0.4ML ~~LOC~~ SOLN
40.0000 mg | SUBCUTANEOUS | Status: DC
  Administered 2019-12-31 – 2020-01-02 (×3): 40 mg via SUBCUTANEOUS
  Filled 2019-12-31 (×3): qty 0.4

## 2019-12-31 MED ORDER — LACTATED RINGERS IV SOLN: INTRAVENOUS | Status: DC

## 2019-12-31 MED ORDER — POTASSIUM CHLORIDE CRYS ER 20 MEQ PO TBCR
20.0000 meq | EXTENDED_RELEASE_TABLET | Freq: Every day | ORAL | Status: DC
  Administered 2019-12-31 – 2020-01-03 (×4): 20 meq via ORAL
  Filled 2019-12-31 (×4): qty 1

## 2019-12-31 MED ORDER — PIPERACILLIN-TAZOBACTAM 3.375 G IVPB 30 MIN
3.3750 g | Freq: Once | INTRAVENOUS | Status: AC
  Administered 2019-12-31: 3.375 g via INTRAVENOUS
  Filled 2019-12-31: qty 50

## 2019-12-31 MED ORDER — ONDANSETRON HCL 4 MG/2ML IJ SOLN
4.0000 mg | Freq: Four times a day (QID) | INTRAMUSCULAR | Status: DC | PRN
  Administered 2020-01-01: 4 mg via INTRAVENOUS
  Filled 2019-12-31: qty 2

## 2019-12-31 MED ORDER — HYDROMORPHONE HCL 1 MG/ML IJ SOLN
1.0000 mg | INTRAMUSCULAR | Status: DC | PRN
  Administered 2019-12-31 – 2020-01-03 (×11): 1 mg via INTRAVENOUS
  Filled 2019-12-31: qty 1
  Filled 2019-12-31: qty 2
  Filled 2019-12-31 (×7): qty 1
  Filled 2019-12-31: qty 2
  Filled 2019-12-31 (×2): qty 1

## 2019-12-31 MED ORDER — CLONAZEPAM 0.125 MG PO TBDP: 0.5000 mg | ORAL_TABLET | Freq: Two times a day (BID) | ORAL | Status: DC | PRN

## 2019-12-31 NOTE — ED Provider Notes (Signed)
  Physical Exam  BP 133/75 (BP Location: Right Arm)   Pulse 98   Temp 98.2 F (36.8 C) (Oral)   Resp 16   Ht 5\' 4"  (1.626 m)   Wt (!) 148 kg   LMP 11/30/2019   SpO2 100%   BMI 56.01 kg/m   Physical Exam  Gen: pt appears nontoxic  ED Course/Procedures     Procedures  MDM   Pt transferred from [facility]. Please see previous notes for further history.   In brief, patient presented for evaluation abdominal pain, nausea, vomiting.  Work-up showed perforated diverticulitis.  Patient was started on antibiotics and surgery was consulted, requested patient be transferred to [facility], ER for evaluation.  On my evaluation, patient reports pain and nausea is controlled.  She appears nontoxic.  Charge nurse, be proximal discussed with Dr. Harlow Asa from general surgery, who will evaluate patient in the ED.       Franchot Heidelberg, PA-C 12/31/19 0424    Fatima Blank, MD 12/31/19 [phone]

## 2019-12-31 NOTE — ED Notes (Signed)
Phone call received from Dr. Harlow Asa orders obtained for pain control, doctor is aware that Bridget [name] is in room [place] and will be into see her this AM.

## 2019-12-31 NOTE — ED Provider Notes (Signed)
Nursing notes and vitals signs, including pulse oximetry, reviewed.  Summary of this visit's results, reviewed by myself:  EKG:  EKG Interpretation  Date/Time:    Ventricular Rate:    PR Interval:    QRS Duration:   QT Interval:    QTC Calculation:   R Axis:     Text Interpretation:         Labs:  Results for orders placed or performed during the hospital encounter of 12/30/19 (from the past 24 hour(s))  Pregnancy, urine     Status: None   Collection Time: 12/30/19  8:54 PM  Result Value Ref Range   Preg Test, Ur NEGATIVE NEGATIVE  Comprehensive metabolic panel     Status: Abnormal   Collection Time: 12/30/19 10:05 PM  Result Value Ref Range   Sodium 136 135 - 145 mmol/L   Potassium 3.1 (L) 3.5 - 5.1 mmol/L   Chloride 100 98 - 111 mmol/L   CO2 27 22 - 32 mmol/L   Glucose, Bld 118 (H) 70 - 99 mg/dL   BUN 9 6 - 20 mg/dL   Creatinine, Ser 0.99 0.44 - 1.00 mg/dL   Calcium 8.9 8.9 - 10.3 mg/dL   Total Protein 7.0 6.5 - 8.1 g/dL   Albumin 3.6 3.5 - 5.0 g/dL   AST 20 15 - 41 U/L   ALT 14 0 - 44 U/L   Alkaline Phosphatase 40 38 - 126 U/L   Total Bilirubin 2.2 (H) 0.3 - 1.2 mg/dL   GFR, Estimated >60 >60 mL/min   Anion gap 9 5 - 15  CBC with Differential     Status: Abnormal   Collection Time: 12/30/19 10:05 PM  Result Value Ref Range   WBC 11.1 (H) 4.0 - 10.5 K/uL   RBC 4.26 3.87 - 5.11 MIL/uL   Hemoglobin 12.7 12.0 - 15.0 g/dL   HCT 38.7 36 - 46 %   MCV 90.8 80.0 - 100.0 fL   MCH 29.8 26.0 - 34.0 pg   MCHC 32.8 30.0 - 36.0 g/dL   RDW 13.5 11.5 - 15.5 %   Platelets 330 150 - 400 K/uL   nRBC 0.0 0.0 - 0.2 %   Neutrophils Relative % 80 %   Neutro Abs 8.8 (H) 1.7 - 7.7 K/uL   Lymphocytes Relative 12 %   Lymphs Abs 1.4 0.7 - 4.0 K/uL   Monocytes Relative 8 %   Monocytes Absolute 0.9 0.1 - 1.0 K/uL   Eosinophils Relative 0 %   Eosinophils Absolute 0.0 0.0 - 0.5 K/uL   Basophils Relative 0 %   Basophils Absolute 0.0 0.0 - 0.1 K/uL   Immature Granulocytes 0 %    Abs Immature Granulocytes 0.03 0.00 - 0.07 K/uL  Lipase, blood     Status: None   Collection Time: 12/30/19 10:05 PM  Result Value Ref Range   Lipase 26 11 - 51 U/L  Wet prep, genital     Status: Abnormal   Collection Time: 12/30/19 11:36 PM   Specimen: PATH Cytology Cervicovaginal Ancillary Only  Result Value Ref Range   Yeast Wet Prep HPF POC NONE SEEN NONE SEEN   Trich, Wet Prep NONE SEEN NONE SEEN   Clue Cells Wet Prep HPF POC PRESENT (A) NONE SEEN   WBC, Wet Prep HPF POC MANY (A) NONE SEEN   Sperm NONE SEEN     Imaging Studies: CT ABDOMEN PELVIS W CONTRAST  Result Date: 12/31/2019 CLINICAL DATA:  Abdominal pain X 2 days, vomited  X 6 times yesterday EXAM: CT ABDOMEN AND PELVIS WITH CONTRAST TECHNIQUE: Multidetector CT imaging of the abdomen and pelvis was performed using the standard protocol following bolus administration of intravenous contrast. CONTRAST:  167mL OMNIPAQUE IOHEXOL 300 MG/ML  SOLN COMPARISON:  Ultrasound renal 08/13/2018, CT abdomen pelvis 03/21/2002 report without imaging. FINDINGS: Lower chest: No acute abnormality. Hepatobiliary: No focal liver abnormality. No gallstones, gallbladder wall thickening, or pericholecystic fluid. No biliary dilatation. Pancreas: No focal lesion. Normal pancreatic contour. No surrounding inflammatory changes. No main pancreatic ductal dilatation. Spleen: Normal in size without focal abnormality. A splenule is identified. Adrenals/Urinary Tract: No adrenal nodule bilaterally. Bilateral kidneys enhance symmetrically. 1.9 cm fluid density lesion within the right kidney likely represents a simple renal cyst. There is a 3 mm left inferior renal pole calculus. No hydronephrosis. No hydroureter. The urinary bladder is decompressed and unremarkable. Stomach/Bowel: Stomach is within normal limits. No evidence of small bowel wall thickening or dilatation. Bowel wall thickening and pericolonic fat stranding of mid sigmoid colon. Appendix appears normal.  Vascular/Lymphatic: No significant vascular findings are present. No enlarged abdominal or pelvic lymph nodes. Reproductive: Uterus and bilateral adnexa are unremarkable. Other: Several foci of free intraperitoneal gas scattered throughout the abdomen and pelvis. No organized fluid collection. No large volume ascites. Musculoskeletal: Tiny fat containing umbilical hernia. No suspicious lytic or blastic osseous lesions. No acute displaced fracture. Multilevel degenerative changes of the spine. IMPRESSION: 1. Perforated sigmoid diverticulitis. No organized fluid collection, no large volume ascites. 2. Nonobstructive 3 mm left nephrolithiasis. These results were called by telephone at the time of interpretation on 12/31/2019 at 1:14 am to provider Dr. Florina Ou, who verbally acknowledged these results. Electronically Signed   By: Iven Finn M.D.   On: 12/31/2019 01:22   1:32 AM Zosyn ordered for perforated diverticulitis.  Patient's pain is adequately controlled at present time.  We will consult general surgery and have her admitted for further evaluation and treatment.  1:39 AM Armandina Gemma of general surgery would like patient transferred for to [facility] ED so they can evaluate her.  CRITICAL CARE Performed by: Karen Chafe Imagene Boss Total critical care time: 30 minutes Critical care time was exclusive of separately billable procedures and treating other patients. Critical care was necessary to treat or prevent imminent or life-threatening deterioration. Critical care was time spent personally by me on the following activities: development of treatment plan with patient and/or surrogate as well as nursing, discussions with consultants, evaluation of patient's response to treatment, examination of patient, obtaining history from patient or surrogate, ordering and performing treatments and interventions, ordering and review of laboratory studies, ordering and review of radiographic studies, pulse oximetry and  re-evaluation of patient's condition.      Shalae Belmonte, Jenny Reichmann, MD 12/31/19 [phone]

## 2019-12-31 NOTE — H&P (Signed)
Dickie La Yamada Mar 01, 1976  [id number].    HPI:  Ms. Mckamey is a 44 yo female who presents with perforated diverticulitis. She reports sudden onset abdominal pain starting yesterday afternoon while she was at work. She denies fevers. She last a had a bowel movement yesterday, which was normal and nonbloody. The pain is associated with nausea and vomiting. She presented to the ED. WBC was 11, and total bilirubin was mildly elevated at 2.2. A CT abd/pelvis showed sigmoid inflammation with several small foci of pneumoperitoneum throughout the abdomen, consistent with perforated diverticulitis. She was transferred to [facility] and admitted. She remains hemodynamically stable, reports some improvement in her pain this morning.  Prior abdominal surgeries include one C section. She has never had a colonoscopy and has no known family history of colorectal cancer. 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  ROS: Review of Systems  Constitutional: Negative for chills and fever.  HENT: Negative for congestion.   Respiratory: Negative for shortness of breath and wheezing.   Cardiovascular: Negative for chest pain.  Gastrointestinal: Positive for abdominal pain, nausea and vomiting. Negative for blood in stool and diarrhea.  Genitourinary: Positive for hematuria.  Musculoskeletal: Negative for falls and neck pain.  Skin: Negative for rash.  Neurological: Negative for dizziness and speech change.  Psychiatric/Behavioral: Negative for memory loss. The patient does not have insomnia.     Family History  Problem Relation Age of Onset  . Deep vein thrombosis Father   . Diabetes Father   . Hypertension Mother   . Hypertension Other   . Diabetes Paternal Aunt   . Heart disease Maternal Grandmother   . Heart disease Maternal Grandfather   . Diabetes Paternal Grandmother     Past Medical History:  Diagnosis Date  . Constipation   .  Edema   . Headache(784.0)   . Morbid obesity ([place])   . Pulmonary embolism [facility])     Past Surgical History:  Procedure Laterality Date  . BREAST REDUCTION SURGERY    . BREAST SURGERY  2005   reduction  . CESAREAN SECTION N/A 03/20/2016   Procedure: CESAREAN SECTION;  Surgeon: Christophe Louis, MD;  Location: [facility];  Service: Obstetrics;  Laterality: N/A;  . TUBAL LIGATION Bilateral 03/20/2016   Procedure: BILATERAL TUBAL LIGATION;  Surgeon: Christophe Louis, MD;  Location: [facility];  Service: Obstetrics;  Laterality: Bilateral;    Social History:  reports that she has never smoked. She has never used smokeless tobacco. She reports current alcohol use. She reports that she does not use drugs.  Allergies: No Known Allergies  (Not in a hospital admission)    Physical Exam: Blood pressure 106/74, pulse 85, temperature 98.2 F (36.8 C), temperature source Oral, resp. rate 17, height 5\' 4"  (1.626 m), weight (!) 148 kg, last menstrual period 11/30/2019, SpO2 93 %, unknown if currently breastfeeding. General: resting comfortably, appears stated age, no apparent distress Neurological: alert and oriented, no focal deficits, cranial nerves grossly in tact HEENT: normocephalic, atraumatic, oropharynx clear CV: regular rate and rhythm, no murmurs, extremities warm and well-perfused Respiratory: normal work of breathing, lungs clear to auscultation bilaterally, symmetric chest wall expansion Abdomen: obese, soft, nondistended, mildly tender to palpation in mid-abdomen, no peritoneal signs or rigidity. No masses or organomegaly. Extremities: warm and well-perfused, no deformities, moving all extremities spontaneously Psychiatric: normal mood and affect Skin: warm and dry, no jaundice,  no rashes or lesions   Results for orders placed or performed during the hospital encounter of 12/30/19 (from the past 48 hour(s))  Urinalysis, Routine w reflex microscopic Urine, Clean Catch     Status:  Abnormal   Collection Time: 12/30/19  8:54 PM  Result Value Ref Range   Color, Urine YELLOW YELLOW   APPearance TURBID (A) CLEAR   Specific Gravity, Urine >1.030 (H) 1.005 - 1.030   pH 5.5 5.0 - 8.0   Glucose, UA NEGATIVE NEGATIVE mg/dL   Hgb urine dipstick LARGE (A) NEGATIVE   Bilirubin Urine NEGATIVE NEGATIVE   Ketones, ur NEGATIVE NEGATIVE mg/dL   Protein, ur NEGATIVE NEGATIVE mg/dL   Nitrite NEGATIVE NEGATIVE   Leukocytes,Ua TRACE (A) NEGATIVE    Comment: Performed at [facility], [address] [place], [place] [address]  Pregnancy, urine     Status: None   Collection Time: 12/30/19  8:54 PM  Result Value Ref Range   Preg Test, Ur NEGATIVE NEGATIVE    Comment:        THE SENSITIVITY OF THIS METHODOLOGY IS >20 mIU/mL. Performed at [facility], [address] [place], [place] [address]   Urinalysis, Microscopic (reflex)     Status: Abnormal   Collection Time: 12/30/19  8:54 PM  Result Value Ref Range   RBC / HPF >50 0 - 5 RBC/hpf   WBC, UA 6-10 0 - 5 WBC/hpf   Bacteria, UA MANY (A) NONE SEEN   Squamous Epithelial / LPF 6-10 0 - 5    Comment: Performed at [facility], [address] [place], [place] [address]  Comprehensive metabolic panel     Status: Abnormal   Collection Time: 12/30/19 10:05 PM  Result Value Ref Range   Sodium 136 135 - 145 mmol/L   Potassium 3.1 (L) 3.5 - 5.1 mmol/L   Chloride 100 98 - 111 mmol/L   CO2 27 22 - 32 mmol/L   Glucose, Bld 118 (H) 70 - 99 mg/dL    Comment: Glucose reference range applies only to samples taken after fasting for at least 8 hours.   BUN 9 6 - 20 mg/dL   Creatinine, Ser 0.99 0.44 - 1.00 mg/dL   Calcium 8.9 8.9 - 10.3 mg/dL   Total Protein 7.0 6.5 - 8.1 g/dL   Albumin 3.6 3.5 - 5.0 g/dL   AST 20 15 - 41 U/L   ALT 14 0 - 44 U/L   Alkaline Phosphatase 40 38 - 126 U/L   Total Bilirubin 2.2 (H) 0.3 - 1.2 mg/dL   GFR, Estimated >60 >60 mL/min   Anion gap 9 5 - 15    Comment: Performed  at [facility], [address] [place], [place] [address]  CBC with Differential     Status: Abnormal   Collection Time: 12/30/19 10:05 PM  Result Value Ref Range   WBC 11.1 (H) 4.0 - 10.5 K/uL   RBC 4.26 3.87 - 5.11 MIL/uL   Hemoglobin 12.7 12.0 - 15.0 g/dL   HCT 38.7 36 - 46 %   MCV 90.8 80.0 - 100.0 fL   MCH 29.8 26.0 - 34.0 pg   MCHC 32.8 30.0 - 36.0 g/dL   RDW 13.5 11.5 - 15.5 %   Platelets 330 150 - 400 K/uL   nRBC 0.0 0.0 - 0.2 %   Neutrophils Relative % 80 %   Neutro Abs 8.8 (H) 1.7 - 7.7  K/uL   Lymphocytes Relative 12 %   Lymphs Abs 1.4 0.7 - 4.0 K/uL   Monocytes Relative 8 %   Monocytes Absolute 0.9 0.1 - 1.0 K/uL   Eosinophils Relative 0 %   Eosinophils Absolute 0.0 0.0 - 0.5 K/uL   Basophils Relative 0 %   Basophils Absolute 0.0 0.0 - 0.1 K/uL   Immature Granulocytes 0 %   Abs Immature Granulocytes 0.03 0.00 - 0.07 K/uL    Comment: Performed at [facility], [address] [place], [place] [address]  Lipase, blood     Status: None   Collection Time: 12/30/19 10:05 PM  Result Value Ref Range   Lipase 26 11 - 51 U/L    Comment: Performed at [facility], [address] [place], [place] [address]  Wet prep, genital     Status: Abnormal   Collection Time: 12/30/19 11:36 PM   Specimen: PATH Cytology Cervicovaginal Ancillary Only  Result Value Ref Range   Yeast Wet Prep HPF POC NONE SEEN NONE SEEN   Trich, Wet Prep NONE SEEN NONE SEEN   Clue Cells Wet Prep HPF POC PRESENT (A) NONE SEEN   WBC, Wet Prep HPF POC MANY (A) NONE SEEN   Sperm NONE SEEN     Comment: Performed at [facility], [address] [place], [place] [address]  Respiratory Panel by RT PCR (Flu A&B, Covid) - Nasopharyngeal Swab     Status: None   Collection Time: 12/31/19  2:05 AM   Specimen: Nasopharyngeal Swab  Result Value Ref Range   SARS Coronavirus 2 by RT PCR NEGATIVE NEGATIVE    Comment: (NOTE) SARS-CoV-2 target nucleic acids are NOT  DETECTED.  The SARS-CoV-2 RNA is generally detectable in upper respiratoy specimens during the acute phase of infection. The lowest concentration of SARS-CoV-2 viral copies this assay can detect is 131 copies/mL. A negative result does not preclude SARS-Cov-2 infection and should not be used as the sole basis for treatment or other patient management decisions. A negative result may occur with  improper specimen collection/handling, submission of specimen other than nasopharyngeal swab, presence of viral mutation(s) within the areas targeted by this assay, and inadequate number of viral copies (<131 copies/mL). A negative result must be combined with clinical observations, patient history, and epidemiological information. The expected result is Negative.  Fact Sheet for Patients:  PinkCheek.be  Fact Sheet for Healthcare Providers:  GravelBags.it  This test is no t yet approved or cleared by the [place] FDA and  has been authorized for detection and/or diagnosis of SARS-CoV-2 by FDA under an Emergency Use Authorization (EUA). This EUA will remain  in effect (meaning this test can be used) for the duration of the COVID-19 declaration under Section 564(b)(1) of the Act, 21 U.S.C. section 360bbb-3(b)(1), unless the authorization is terminated or revoked sooner.     Influenza A by PCR NEGATIVE NEGATIVE   Influenza B by PCR NEGATIVE NEGATIVE    Comment: (NOTE) The Xpert Xpress SARS-CoV-2/FLU/RSV assay is intended as an aid in  the diagnosis of influenza from Nasopharyngeal swab specimens and  should not be used as a sole basis for treatment. Nasal washings and  aspirates are unacceptable for Xpert Xpress SARS-CoV-2/FLU/RSV  testing.  Fact Sheet for Patients: PinkCheek.be  Fact Sheet for Healthcare Providers: GravelBags.it  This test is not yet approved or  cleared by the [place] FDA and  has been authorized for detection and/or diagnosis  of SARS-CoV-2 by  FDA under an Emergency Use Authorization (EUA). This EUA will remain  in effect (meaning this test can be used) for the duration of the  Covid-19 declaration under Section 564(b)(1) of the Act, 21  U.S.C. section 360bbb-3(b)(1), unless the authorization is  terminated or revoked. Performed at [facility], [address] [place], [place] [address]    CT ABDOMEN PELVIS W CONTRAST  Result Date: 12/31/2019 CLINICAL DATA:  Abdominal pain X 2 days, vomited X 6 times yesterday EXAM: CT ABDOMEN AND PELVIS WITH CONTRAST TECHNIQUE: Multidetector CT imaging of the abdomen and pelvis was performed using the standard protocol following bolus administration of intravenous contrast. CONTRAST:  134mL OMNIPAQUE IOHEXOL 300 MG/ML  SOLN COMPARISON:  Ultrasound renal 08/13/2018, CT abdomen pelvis 03/21/2002 report without imaging. FINDINGS: Lower chest: No acute abnormality. Hepatobiliary: No focal liver abnormality. No gallstones, gallbladder wall thickening, or pericholecystic fluid. No biliary dilatation. Pancreas: No focal lesion. Normal pancreatic contour. No surrounding inflammatory changes. No main pancreatic ductal dilatation. Spleen: Normal in size without focal abnormality. A splenule is identified. Adrenals/Urinary Tract: No adrenal nodule bilaterally. Bilateral kidneys enhance symmetrically. 1.9 cm fluid density lesion within the right kidney likely represents a simple renal cyst. There is a 3 mm left inferior renal pole calculus. No hydronephrosis. No hydroureter. The urinary bladder is decompressed and unremarkable. Stomach/Bowel: Stomach is within normal limits. No evidence of small bowel wall thickening or dilatation. Bowel wall thickening and pericolonic fat stranding of mid sigmoid colon. Appendix appears normal. Vascular/Lymphatic: No significant vascular findings are present. No  enlarged abdominal or pelvic lymph nodes. Reproductive: Uterus and bilateral adnexa are unremarkable. Other: Several foci of free intraperitoneal gas scattered throughout the abdomen and pelvis. No organized fluid collection. No large volume ascites. Musculoskeletal: Tiny fat containing umbilical hernia. No suspicious lytic or blastic osseous lesions. No acute displaced fracture. Multilevel degenerative changes of the spine. IMPRESSION: 1. Perforated sigmoid diverticulitis. No organized fluid collection, no large volume ascites. 2. Nonobstructive 3 mm left nephrolithiasis. These results were called by telephone at the time of interpretation on 12/31/2019 at 1:14 am to provider Dr. Florina Ou, who verbally acknowledged these results. Electronically Signed   By: Iven Finn M.D.   On: 12/31/2019 01:22      Assessment/Plan 44 yo female presenting with acute sigmoid diverticulitis with perforation. She has some abdominal tenderness but no peritonitis, and she is hemodynamically stable without signs of systemic illness. Will proceed with nonoperative management, bowel rest, and IV antibiotics. I discussed with the patient that if she were to clinically worsen or develop diffuse peritonitis she would need operative intervention, which would include sigmoid resection with a colostomy. - NPO, IV fluid hydration - Trend WBC. Labs pending this morning. LFTs ordered given elevated bilirubin. - Zosyn - Serial abdominal exams - VTE: prophylactic lovenox, SCDs, hold Xarelto - Dispo: admit to inpatient  Michaelle Birks, [facility], Hepatobiliary and Pancreatic Surgery 12/31/19 8:08 AM

## 2019-12-31 NOTE — Progress Notes (Signed)
Patient c/o tingling hands and that she feels like her heart is racing. She says that this happens frequently at home when her K is low. She was given a K tablet around 1700. Vitals stable with HR of 104. MD made aware. No new orders.

## 2020-01-01 LAB — CBC
HCT: 31.1 % — ABNORMAL LOW (ref 36.0–46.0)
Hemoglobin: 10.3 g/dL — ABNORMAL LOW (ref 12.0–15.0)
MCH: 30.4 pg (ref 26.0–34.0)
MCHC: 33.1 g/dL (ref 30.0–36.0)
MCV: 91.7 fL (ref 80.0–100.0)
Platelets: 274 10*3/uL (ref 150–400)
RBC: 3.39 MIL/uL — ABNORMAL LOW (ref 3.87–5.11)
RDW: 13.8 % (ref 11.5–15.5)
WBC: 12.3 10*3/uL — ABNORMAL HIGH (ref 4.0–10.5)
nRBC: 0 % (ref 0.0–0.2)

## 2020-01-01 LAB — COMPREHENSIVE METABOLIC PANEL
ALT: 12 U/L (ref 0–44)
AST: 14 U/L — ABNORMAL LOW (ref 15–41)
Albumin: 2.9 g/dL — ABNORMAL LOW (ref 3.5–5.0)
Alkaline Phosphatase: 43 U/L (ref 38–126)
Anion gap: 10 (ref 5–15)
BUN: 7 mg/dL (ref 6–20)
CO2: 24 mmol/L (ref 22–32)
Calcium: 7.9 mg/dL — ABNORMAL LOW (ref 8.9–10.3)
Chloride: 105 mmol/L (ref 98–111)
Creatinine, Ser: 0.69 mg/dL (ref 0.44–1.00)
GFR, Estimated: >60 mL/min (ref >60)
Glucose, Bld: 105 mg/dL — ABNORMAL HIGH (ref 70–99)
Potassium: 3.5 mmol/L (ref 3.5–5.1)
Sodium: 139 mmol/L (ref 135–145)
Total Bilirubin: 2.1 mg/dL — ABNORMAL HIGH (ref 0.3–1.2)
Total Protein: 5.8 g/dL — ABNORMAL LOW (ref 6.5–8.1)

## 2020-01-01 MED ORDER — LORATADINE 10 MG PO TABS
10.0000 mg | ORAL_TABLET | Freq: Every day | ORAL | Status: DC
  Administered 2020-01-01 – 2020-01-02 (×2): 10 mg via ORAL
  Filled 2020-01-01 (×3): qty 1

## 2020-01-01 NOTE — Progress Notes (Signed)
Subjective/Chief Complaint: peeing and having my period  Pain better  Having what she feels is menstrual pain  Peeing a lot    Objective: Vital signs in last 24 hours: Temp:  [98.2 F (36.8 C)-99.9 F (37.7 C)] 98.6 F (37 C) (10/16 0526) Pulse Rate:  [93-105] 98 (10/16 0526) Resp:  [14-18] 14 (10/16 0526) BP: (116-129)/(54-82) 116/65 (10/16 0526) SpO2:  [94 %-100 %] 94 % (10/16 0526) Last BM Date: 12/31/19  Intake/Output from previous day: 10/15 0701 - 10/16 0700 In: 2150 [I.V.:2000; IV Piggyback:150] Out: 0  Intake/Output this shift: No intake/output data recorded.   General: resting comfortably, appears stated age, no apparent distress Neurological: alert and oriented, no focal deficits, cranial nerves grossly in tact HEENT: normocephalic, atraumatic, oropharynx clear CV: regular rate and rhythm, no murmurs, extremities warm and well-perfused Respiratory: normal work of breathing, lungs clear to auscultation bilaterally, symmetric chest wall expansion Abdomen: obese, soft, nondistended, mildly tender to palpation in mid-abdomen, no peritoneal signs or rigidity. No masses or organomegaly. Extremities: warm and well-perfused, no deformities, moving all extremities spontaneously  Lab Results:  Recent Labs    12/31/19 0839 01/01/20 0642  WBC 9.5 12.3*  HGB 11.6* 10.3*  HCT 34.3* 31.1*  PLT 294 274   BMET Recent Labs    12/31/19 0839 01/01/20 0642  NA 138 139  K 3.8 3.5  CL 105 105  CO2 24 24  GLUCOSE 117* 105*  BUN 10 7  CREATININE 0.71 0.69  CALCIUM 8.3* 7.9*   PT/INR No results for input(s): LABPROT, INR in the last 72 hours. ABG No results for input(s): PHART, HCO3 in the last 72 hours.  Invalid input(s): PCO2, PO2  Studies/Results: CT ABDOMEN PELVIS W CONTRAST  Result Date: 12/31/2019 CLINICAL DATA:  Abdominal pain X 2 days, vomited X 6 times yesterday EXAM: CT ABDOMEN AND PELVIS WITH CONTRAST TECHNIQUE: Multidetector CT imaging of the  abdomen and pelvis was performed using the standard protocol following bolus administration of intravenous contrast. CONTRAST:  159mL OMNIPAQUE IOHEXOL 300 MG/ML  SOLN COMPARISON:  Ultrasound renal 08/13/2018, CT abdomen pelvis 03/21/2002 report without imaging. FINDINGS: Lower chest: No acute abnormality. Hepatobiliary: No focal liver abnormality. No gallstones, gallbladder wall thickening, or pericholecystic fluid. No biliary dilatation. Pancreas: No focal lesion. Normal pancreatic contour. No surrounding inflammatory changes. No main pancreatic ductal dilatation. Spleen: Normal in size without focal abnormality. A splenule is identified. Adrenals/Urinary Tract: No adrenal nodule bilaterally. Bilateral kidneys enhance symmetrically. 1.9 cm fluid density lesion within the right kidney likely represents a simple renal cyst. There is a 3 mm left inferior renal pole calculus. No hydronephrosis. No hydroureter. The urinary bladder is decompressed and unremarkable. Stomach/Bowel: Stomach is within normal limits. No evidence of small bowel wall thickening or dilatation. Bowel wall thickening and pericolonic fat stranding of mid sigmoid colon. Appendix appears normal. Vascular/Lymphatic: No significant vascular findings are present. No enlarged abdominal or pelvic lymph nodes. Reproductive: Uterus and bilateral adnexa are unremarkable. Other: Several foci of free intraperitoneal gas scattered throughout the abdomen and pelvis. No organized fluid collection. No large volume ascites. Musculoskeletal: Tiny fat containing umbilical hernia. No suspicious lytic or blastic osseous lesions. No acute displaced fracture. Multilevel degenerative changes of the spine. IMPRESSION: 1. Perforated sigmoid diverticulitis. No organized fluid collection, no large volume ascites. 2. Nonobstructive 3 mm left nephrolithiasis. These results were called by telephone at the time of interpretation on 12/31/2019 at 1:14 am to provider Dr. Florina Ou,  who verbally acknowledged these results. Electronically Signed  By: Iven Finn M.D.   On: 12/31/2019 01:22    Anti-infectives: Anti-infectives (From admission, onward)   Start     Dose/Rate Route Frequency Ordered Stop   12/31/19 1000  piperacillin-tazobactam (ZOSYN) IVPB 3.375 g        3.375 g 12.5 mL/hr over 240 Minutes Intravenous Every 8 hours 12/31/19 0812     12/31/19 0130  piperacillin-tazobactam (ZOSYN) IVPB 3.375 g        3.375 g 100 mL/hr over 30 Minutes Intravenous  Once 12/31/19 0121 12/31/19 0231   12/30/19 2345  cefTRIAXone (ROCEPHIN) 1 g in sodium chloride 0.9 % 100 mL IVPB        1 g 200 mL/hr over 30 Minutes Intravenous  Once 12/30/19 2337 12/31/19 0128   12/30/19 0000  doxycycline (VIBRA-TABS) 100 MG tablet        100 mg Oral 2 times daily 12/30/19 2339        Assessment/Plan: Acute diverticulitis with microperforation- improved on ABX   CLEAR LIQUID DIET , DECREASE ivf - Trend WBC.  Up today to 12.3  - Zosyn - Serial abdominal exams stable to improved  - VTE: prophylactic lovenox, SCDs, hold Xarelto for now  - Dispo: admit to inpatient   LOS: 1 day    Turner Daniels MD  01/01/2020

## 2020-01-02 LAB — COMPREHENSIVE METABOLIC PANEL
ALT: 11 U/L (ref 0–44)
AST: 16 U/L (ref 15–41)
Albumin: 2.6 g/dL — ABNORMAL LOW (ref 3.5–5.0)
Alkaline Phosphatase: 43 U/L (ref 38–126)
Anion gap: 11 (ref 5–15)
BUN: <5 mg/dL — ABNORMAL LOW (ref 6–20)
CO2: 25 mmol/L (ref 22–32)
Calcium: 8.2 mg/dL — ABNORMAL LOW (ref 8.9–10.3)
Chloride: 103 mmol/L (ref 98–111)
Creatinine, Ser: 0.59 mg/dL (ref 0.44–1.00)
GFR, Estimated: >60 mL/min (ref >60)
Glucose, Bld: 105 mg/dL — ABNORMAL HIGH (ref 70–99)
Potassium: 3.1 mmol/L — ABNORMAL LOW (ref 3.5–5.1)
Sodium: 139 mmol/L (ref 135–145)
Total Bilirubin: 1.7 mg/dL — ABNORMAL HIGH (ref 0.3–1.2)
Total Protein: 5.6 g/dL — ABNORMAL LOW (ref 6.5–8.1)

## 2020-01-02 LAB — CBC
HCT: 30.2 % — ABNORMAL LOW (ref 36.0–46.0)
HCT: 30.8 % — ABNORMAL LOW (ref 36.0–46.0)
Hemoglobin: 10 g/dL — ABNORMAL LOW (ref 12.0–15.0)
Hemoglobin: 9.8 g/dL — ABNORMAL LOW (ref 12.0–15.0)
MCH: 30.3 pg (ref 26.0–34.0)
MCH: 30.3 pg (ref 26.0–34.0)
MCHC: 31.8 g/dL (ref 30.0–36.0)
MCHC: 33.1 g/dL (ref 30.0–36.0)
MCV: 91.5 fL (ref 80.0–100.0)
MCV: 95.4 fL (ref 80.0–100.0)
Platelets: 287 10*3/uL (ref 150–400)
Platelets: 288 10*3/uL (ref 150–400)
RBC: 3.23 MIL/uL — ABNORMAL LOW (ref 3.87–5.11)
RBC: 3.3 MIL/uL — ABNORMAL LOW (ref 3.87–5.11)
RDW: 13.3 % (ref 11.5–15.5)
RDW: 13.3 % (ref 11.5–15.5)
WBC: 7.6 10*3/uL (ref 4.0–10.5)
WBC: 9.4 10*3/uL (ref 4.0–10.5)
nRBC: 0 % (ref 0.0–0.2)
nRBC: 0 % (ref 0.0–0.2)

## 2020-01-02 MED ORDER — POTASSIUM CHLORIDE CRYS ER 20 MEQ PO TBCR
20.0000 meq | EXTENDED_RELEASE_TABLET | Freq: Once | ORAL | Status: AC
  Administered 2020-01-02: 20 meq via ORAL
  Filled 2020-01-02: qty 1

## 2020-01-02 NOTE — Progress Notes (Signed)
   Subjective/Chief Complaint: Less pain today Passing flatus No BM Tolerated clear liquids   Objective: Vital signs in last 24 hours: Temp:  [97.6 F (36.4 C)-98.3 F (36.8 C)] 98.3 F (36.8 C) (10/17 0502) Pulse Rate:  [76-100] 76 (10/17 0502) Resp:  [16-18] 17 (10/17 0502) BP: (108-128)/(71-86) 108/75 (10/17 0502) SpO2:  [97 %-100 %] 97 % (10/17 0502) Last BM Date: 12/31/19  Intake/Output from previous day: 10/16 0701 - 10/17 0700 In: 2120.3 [P.O.:1320; I.V.:717.7; IV Piggyback:82.6] Out: 0  Intake/Output this shift: No intake/output data recorded.  Exam: Awake and alert Looks comfortable Abdomen soft, still with some tenderness and guarding in the LLQ  Lab Results:  Recent Labs    01/02/20 0005 01/02/20 0527  WBC 9.4 7.6  HGB 10.0* 9.8*  HCT 30.2* 30.8*  PLT 288 287   BMET Recent Labs    01/01/20 0642 01/02/20 0527  NA 139 139  K 3.5 3.1*  CL 105 103  CO2 24 25  GLUCOSE 105* 105*  BUN 7 <5*  CREATININE 0.69 0.59  CALCIUM 7.9* 8.2*   PT/INR No results for input(s): LABPROT, INR in the last 72 hours. ABG No results for input(s): PHART, HCO3 in the last 72 hours.  Invalid input(s): PCO2, PO2  Studies/Results: No results found.  Anti-infectives: Anti-infectives (From admission, onward)   Start     Dose/Rate Route Frequency Ordered Stop   12/31/19 1000  piperacillin-tazobactam (ZOSYN) IVPB 3.375 g        3.375 g 12.5 mL/hr over 240 Minutes Intravenous Every 8 hours 12/31/19 0812     12/31/19 0130  piperacillin-tazobactam (ZOSYN) IVPB 3.375 g        3.375 g 100 mL/hr over 30 Minutes Intravenous  Once 12/31/19 0121 12/31/19 0231   12/30/19 2345  cefTRIAXone (ROCEPHIN) 1 g in sodium chloride 0.9 % 100 mL IVPB        1 g 200 mL/hr over 30 Minutes Intravenous  Once 12/30/19 2337 12/31/19 0128   12/30/19 0000  doxycycline (VIBRA-TABS) 100 MG tablet        100 mg Oral 2 times daily 12/30/19 2339        Assessment/Plan: Acute diverticulitis  with microperforation- improved on ABX   Advance to full liquid diet If improved tomorrow, consider advancing diet and resuming Xarelto   LOS: 2 days    Coralie Keens 01/02/2020

## 2020-01-03 LAB — COMPREHENSIVE METABOLIC PANEL
ALT: 13 U/L (ref 0–44)
AST: 15 U/L (ref 15–41)
Albumin: 2.6 g/dL — ABNORMAL LOW (ref 3.5–5.0)
Alkaline Phosphatase: 43 U/L (ref 38–126)
Anion gap: 10 (ref 5–15)
BUN: <5 mg/dL — ABNORMAL LOW (ref 6–20)
CO2: 25 mmol/L (ref 22–32)
Calcium: 7.9 mg/dL — ABNORMAL LOW (ref 8.9–10.3)
Chloride: 105 mmol/L (ref 98–111)
Creatinine, Ser: 0.63 mg/dL (ref 0.44–1.00)
GFR, Estimated: >60 mL/min (ref >60)
Glucose, Bld: 110 mg/dL — ABNORMAL HIGH (ref 70–99)
Potassium: 3.2 mmol/L — ABNORMAL LOW (ref 3.5–5.1)
Sodium: 140 mmol/L (ref 135–145)
Total Bilirubin: 0.8 mg/dL (ref 0.3–1.2)
Total Protein: 5.5 g/dL — ABNORMAL LOW (ref 6.5–8.1)

## 2020-01-03 LAB — CBC
HCT: 29.5 % — ABNORMAL LOW (ref 36.0–46.0)
Hemoglobin: 9.9 g/dL — ABNORMAL LOW (ref 12.0–15.0)
MCH: 30.8 pg (ref 26.0–34.0)
MCHC: 33.6 g/dL (ref 30.0–36.0)
MCV: 91.9 fL (ref 80.0–100.0)
Platelets: 331 10*3/uL (ref 150–400)
RBC: 3.21 MIL/uL — ABNORMAL LOW (ref 3.87–5.11)
RDW: 13.1 % (ref 11.5–15.5)
WBC: 6.3 10*3/uL (ref 4.0–10.5)
nRBC: 0 % (ref 0.0–0.2)

## 2020-01-03 LAB — GC/CHLAMYDIA PROBE AMP (~~LOC~~) NOT AT ARMC
Chlamydia: NEGATIVE
Comment: NEGATIVE
Comment: NORMAL
Neisseria Gonorrhea: NEGATIVE

## 2020-01-03 MED ORDER — POTASSIUM CHLORIDE CRYS ER 20 MEQ PO TBCR
40.0000 meq | EXTENDED_RELEASE_TABLET | Freq: Every day | ORAL | Status: DC
  Administered 2020-01-04: 40 meq via ORAL
  Filled 2020-01-03: qty 2

## 2020-01-03 MED ORDER — SODIUM CHLORIDE 0.9% FLUSH: 3.0000 mL | INTRAVENOUS | Status: DC | PRN

## 2020-01-03 MED ORDER — AMOXICILLIN-POT CLAVULANATE 875-125 MG PO TABS
1.0000 | ORAL_TABLET | Freq: Two times a day (BID) | ORAL | Status: DC
  Administered 2020-01-03 – 2020-01-04 (×3): 1 via ORAL
  Filled 2020-01-03 (×3): qty 1

## 2020-01-03 MED ORDER — HYDROMORPHONE HCL 1 MG/ML IJ SOLN: 0.5000 mg | INTRAMUSCULAR | Status: DC | PRN

## 2020-01-03 MED ORDER — ACETAMINOPHEN 325 MG PO TABS
650.0000 mg | ORAL_TABLET | Freq: Four times a day (QID) | ORAL | Status: DC | PRN
  Administered 2020-01-03 – 2020-01-04 (×2): 650 mg via ORAL
  Filled 2020-01-03 (×2): qty 2

## 2020-01-03 MED ORDER — SODIUM CHLORIDE 0.9% FLUSH
3.0000 mL | Freq: Two times a day (BID) | INTRAVENOUS | Status: DC
  Administered 2020-01-03 (×2): 3 mL via INTRAVENOUS

## 2020-01-03 MED ORDER — OXYCODONE HCL 5 MG PO TABS
5.0000 mg | ORAL_TABLET | ORAL | Status: DC | PRN
  Administered 2020-01-03 (×2): 5 mg via ORAL
  Filled 2020-01-03: qty 2
  Filled 2020-01-03: qty 1

## 2020-01-03 MED ORDER — SODIUM CHLORIDE 0.9 % IV SOLN: 250.0000 mL | INTRAVENOUS | Status: DC | PRN

## 2020-01-03 MED ORDER — RIVAROXABAN 10 MG PO TABS
20.0000 mg | ORAL_TABLET | Freq: Every day | ORAL | Status: DC
  Administered 2020-01-03: 20 mg via ORAL
  Filled 2020-01-03 (×2): qty 2

## 2020-01-03 MED ORDER — POTASSIUM CHLORIDE CRYS ER 20 MEQ PO TBCR
20.0000 meq | EXTENDED_RELEASE_TABLET | Freq: Once | ORAL | Status: AC
  Administered 2020-01-03: 20 meq via ORAL
  Filled 2020-01-03: qty 1

## 2020-01-03 MED ORDER — RIVAROXABAN 10 MG PO TABS: 20.0000 mg | ORAL_TABLET | Freq: Every day | ORAL | Status: DC

## 2020-01-03 NOTE — Progress Notes (Signed)
[facility] Progress Note     Subjective: Patient reports pain is improved. She reports she had a BM yesterday that was soft. Tolerating FLD. Feels hungry for solid food.   Objective: Vital signs in last 24 hours: Temp:  [97.7 F (36.5 C)-98.5 F (36.9 C)] 97.7 F (36.5 C) (10/18 0454) Pulse Rate:  [70-87] 75 (10/18 0454) Resp:  [16-18] 18 (10/18 0454) BP: (116-145)/(65-87) 128/75 (10/18 0454) SpO2:  [94 %-99 %] 99 % (10/18 0454) Last BM Date: 12/31/19  Intake/Output from previous day: 10/17 0701 - 10/18 0700 In: 2599.9 [P.O.:1200; I.V.:1299.9; IV Piggyback:100] Out: -  Intake/Output this shift: Total I/O In: 360 [P.O.:360] Out: 0   PE: General: pleasant, WD, obese female who is laying in bed in NAD Heart: regular, rate, and rhythm. Lungs: CTAB, no wheezes, rhonchi, or rales noted.  Respiratory effort nonlabored Abd: soft, NT, ND, +BS Psych: A&Ox3 with an appropriate affect.   Lab Results:  Recent Labs    01/02/20 0527 01/03/20 0448  WBC 7.6 6.3  HGB 9.8* 9.9*  HCT 30.8* 29.5*  PLT 287 331   BMET Recent Labs    01/02/20 0527 01/03/20 0448  NA 139 140  K 3.1* 3.2*  CL 103 105  CO2 25 25  GLUCOSE 105* 110*  BUN <5* <5*  CREATININE 0.59 0.63  CALCIUM 8.2* 7.9*   PT/INR No results for input(s): LABPROT, INR in the last 72 hours. CMP     Component Value Date/Time   NA 140 01/03/2020 0448   K 3.2 (L) 01/03/2020 0448   CL 105 01/03/2020 0448   CO2 25 01/03/2020 0448   GLUCOSE 110 (H) 01/03/2020 0448   BUN <5 (L) 01/03/2020 0448   CREATININE 0.63 01/03/2020 0448   CALCIUM 7.9 (L) 01/03/2020 0448   PROT 5.5 (L) 01/03/2020 0448   ALBUMIN 2.6 (L) 01/03/2020 0448   AST 15 01/03/2020 0448   ALT 13 01/03/2020 0448   ALKPHOS 43 01/03/2020 0448   BILITOT 0.8 01/03/2020 0448   GFRNONAA >60 01/03/2020 0448   GFRAA >60 09/26/2019 2339   Lipase     Component Value Date/Time   LIPASE 26 12/30/2019 2205       Studies/Results: No  results found.  Anti-infectives: Anti-infectives (From admission, onward)   Start     Dose/Rate Route Frequency Ordered Stop   01/03/20 1400  amoxicillin-clavulanate (AUGMENTIN) 875-125 MG per tablet 1 tablet        1 tablet Oral Every 12 hours 01/03/20 1003     12/31/19 1000  piperacillin-tazobactam (ZOSYN) IVPB 3.375 g  Status:  Discontinued        3.375 g 12.5 mL/hr over 240 Minutes Intravenous Every 8 hours 12/31/19 0812 01/03/20 1003   12/31/19 0130  piperacillin-tazobactam (ZOSYN) IVPB 3.375 g        3.375 g 100 mL/hr over 30 Minutes Intravenous  Once 12/31/19 0121 12/31/19 0231   12/30/19 2345  cefTRIAXone (ROCEPHIN) 1 g in sodium chloride 0.9 % 100 mL IVPB        1 g 200 mL/hr over 30 Minutes Intravenous  Once 12/30/19 2337 12/31/19 0128   12/30/19 0000  doxycycline (VIBRA-TABS) 100 MG tablet        100 mg Oral 2 times daily 12/30/19 2339         Assessment/Plan Morbid obesity - BMI 56.01, following with bariatric surgeon at [facility] Hx of PE on xarelto - ok to restart xarelto today Hx of asthma HLD Hypokalemia - K  3.2 this AM, increase PO supplement    - WBC 6.3, afeb - pain improved and patient tolerating FLD and having bowel function - advance to soft diet and change IV abx to PO abx - recheck this afternoon for possible dc either later today vs tomorrow AM - TOC consult for help with FMLA paperwork, if they are unable to help her with this here then she can have it faxed to [place] office   FEN: soft diet, SLIV; replace K VTE: restart xarelto today ID: IV Zosyn 10/15>10/18; PO augmentin 10/18>>   LOS: 3 days    Norm Parcel , [facility] 01/03/2020, 10:28 AM Please see Amion for pager number during day hours 7:00am-4:30pm

## 2020-01-03 NOTE — TOC Progression Note (Signed)
Transition of Care [facility]) - Progression Note    Patient Details  Name: Bridget [name] MRN: [id number] Date of Birth: 21-Sep-1975  Transition of Care [facility]) CM/SW [place], [place] Phone Number: 01/03/2020, 12:51 PM  Clinical Narrative:    Re: FMLA  Patient reports she spoke with her employer and they have faxed her FMLA paperwork to [facility] [employer] to be completed by a physician.   No TOC needs identified.      Barriers to Discharge: No Barriers Identified  Expected Discharge Plan and Services                                                 Social Determinants of Health (SDOH) Interventions    Readmission Risk Interventions No flowsheet data found.

## 2020-01-04 MED ORDER — SACCHAROMYCES BOULARDII 250 MG PO CAPS: 250.0000 mg | ORAL_CAPSULE | Freq: Two times a day (BID) | ORAL | Status: DC

## 2020-01-04 MED ORDER — OXYCODONE HCL 5 MG PO TABS: 5.0000 mg | ORAL_TABLET | Freq: Four times a day (QID) | ORAL | 0 refills | Status: DC | PRN

## 2020-01-04 MED ORDER — AMOXICILLIN-POT CLAVULANATE 875-125 MG PO TABS: 1.0000 | ORAL_TABLET | Freq: Two times a day (BID) | ORAL | 0 refills | Status: AC

## 2020-01-04 MED ORDER — AMOXICILLIN-POT CLAVULANATE 875-125 MG PO TABS
1.0000 | ORAL_TABLET | Freq: Two times a day (BID) | ORAL | 0 refills | Status: DC
Start: 2020-01-04 — End: 2020-01-04

## 2020-01-04 NOTE — Progress Notes (Signed)
D/C instructions given to patient. Patient had no questions. NT or writer will wheel patient out once her family is here to pick her up  

## 2020-01-04 NOTE — Discharge Summary (Signed)
[facility] Discharge Summary   Patient ID: Bridget [name] MRN: [id number] DOB/AGE: 42-Jan-1977 42 y.o.  Admit date: 12/30/2019 Discharge date: 01/04/2020  Admitting Diagnosis: Acute diverticulitis with microperforation  Discharge Diagnosis Patient Active Problem List   Diagnosis Date Noted  . Perforated diverticulum 12/31/2019  . GERD (gastroesophageal reflux disease) 09/29/2019  . Constipation 09/29/2019  . Foot pain, right 08/04/2019  . BPPV (benign paroxysmal positional vertigo) 12/04/2018  . Midsternal chest pain 09/16/2018  . Pulmonary embolism ([place]) 07/22/2018  . Anxiety and depression 07/13/2018  . Arm swelling 07/06/2018  . DOE (dyspnea on exertion) 06/30/2018  . HTN (hypertension) 10/29/2017  . Reactive depression (situational) 10/29/2017  . Pruritic condition 09/25/2016  . Urticaria 09/25/2016  . Anemia 04/23/2016  . Term pregnancy 03/20/2016  . Well adult exam 05/06/2014  . Otitis media 12/13/2011  . Fatigue 12/13/2011  . B12 deficiency 12/13/2011  . Vitamin D deficiency 12/13/2011  . Palpitations 08/06/2010  . Syncope and collapse 08/06/2010  . EDEMA 10/14/2007  . UTI 07/21/2007  . LOW BACK PAIN 07/21/2007  . ELEVATED BP 07/21/2007  . Morbid (severe) obesity due to excess calories ([place]) 11/15/2006  . Migraines 11/15/2006    Consultants None  Imaging: No results found.  Procedures None  Hospital Course:  Bridget [name] is a 44yo female [place] PE on xarelto who was transferred from [facility] to [facility] 10/15 with perforated diverticulitis.  CT abd/pelvis showed sigmoid inflammation with several small foci of pneumoperitoneum throughout the abdomen, consistent with perforated diverticulitis.  She was admitted to the hospital for IV antibiotics (zosyn) and bowel rest. As pain and leukocytosis improved diet was gradually advanced as tolerated. On 10/19 the patient was voiding well, tolerating diet, having bowel function, ambulating well, pain well  controlled, vital signs stable and felt stable for discharge home.  She will go home with 11 days of augmentin to complete 14 day course of antibiotics, and follow up with PCP to arrange referral to gastroenterology for colonoscopy in 6-8 weeks. Patient will follow up as below and knows to call with questions or concerns.    I have personally reviewed the patients medication history on the [place] controlled substance database.    Physical Exam: General: Alert, NAD Heart: regular, rate, and rhythm. Lungs: CTAB, no wheezes, rhonchi, or rales noted.  Respiratory effort nonlabored Abd: soft, NT, ND, +BS Psych: A&Ox3 with an appropriate affect.   Allergies as of 01/04/2020   No Known Allergies     Medication List    TAKE these medications   acetaminophen 500 MG tablet Commonly known as: TYLENOL Take 1,000 mg by mouth every 6 (six) hours as needed for mild pain.   albuterol 108 (90 Base) MCG/ACT inhaler Commonly known as: VENTOLIN HFA Inhale 1-2 puffs into the lungs every 6 (six) hours as needed for wheezing or shortness of breath.   amoxicillin-clavulanate 875-125 MG tablet Commonly known as: AUGMENTIN Take 1 tablet by mouth every 12 (twelve) hours for 11 days.   cetirizine 10 MG tablet Commonly known as: ZYRTEC Take 10 mg by mouth daily as needed for allergies.   clonazePAM 0.5 MG disintegrating tablet Commonly known as: KLONOPIN Take 1-2 tablets (0.5-1 mg total) by mouth 2 (two) times daily as needed (panic attacks).   diclofenac Sodium 1 % Gel Commonly known as: Voltaren Apply 2 g topically 4 (four) times daily.   doxycycline 100 MG tablet Commonly known as: VIBRA-TABS Take 1 tablet (100 mg total) by mouth 2 (two) times daily.  meclizine 25 MG tablet Commonly known as: ANTIVERT Take 1 tablet (25 mg total) by mouth 3 (three) times daily as needed for dizziness.   multivitamin capsule Take 1 capsule by mouth daily.   oxyCODONE 5 MG immediate release tablet Commonly  known as: Oxy IR/ROXICODONE Take 1 tablet (5 mg total) by mouth every 6 (six) hours as needed for moderate pain.   pantoprazole 40 MG tablet Commonly known as: PROTONIX Take 1 tablet (40 mg total) by mouth daily.   rivaroxaban 20 MG Tabs tablet Commonly known as: Xarelto Take 1 tablet (20 mg total) by mouth daily with supper.   saccharomyces boulardii 250 MG capsule Commonly known as: Florastor Take 1 capsule (250 mg total) by mouth 2 (two) times daily. Recommend starting a probiotic.   senna-docusate 8.6-50 MG tablet Commonly known as: Senokot-S Take 2 tablets by mouth daily as needed for mild constipation or moderate constipation.   spironolactone 25 MG tablet Commonly known as: ALDACTONE Take 1 tablet (25 mg total) by mouth daily.   Symbicort 160-4.5 MCG/ACT inhaler Generic drug: budesonide-formoterol Inhale 2 puffs by mouth twice daily What changed:   when to take this  reasons to take this   Vitamin B-12 1000 MCG Subl Place 1 tablet (1,000 mcg total) under the tongue daily.   Vitamin D3 1.25 MG (50000 UT) Caps Take 1 capsule by mouth once a week.         Follow-up Information    Plotnikov, Evie Lacks, MD. Call.   Specialty: Internal Medicine Why: Call and schedule follow up in 1-2 weeks. You should be referred to GI for colonoscopy in 6-8 weeks.  Contact information: [place] [address] [phone]        Surgery, [place]. Call.   Specialty: General Surgery Why: Call as needed with questions  Contact information: [address] [place] [address] [phone]               Signed: Wellington Hampshire, [facility] 01/04/2020, 8:41 AM Please see Amion for pager number during day hours 7:00am-4:30pm

## 2020-01-07 ENCOUNTER — Telehealth: Payer: Self-pay

## 2020-01-07 NOTE — Telephone Encounter (Cosign Needed)
Transition Care Management Follow-up Telephone Call  Date of discharge and from where: 01/04/2020 from [facility]  How have you been since you were released from the hospital? Still feels faint; potassium deficient (medication changed from 20 meq to 40 meg)  Any questions or concerns? Yes  Items Reviewed:  Did the pt receive and understand the discharge instructions provided? Yes   Medications obtained and verified? Yes   Other? No   Any new allergies since your discharge? No   Dietary orders reviewed? Yes (low Fiber Plan)  Do you have support at home? Yes  (staying with mother)  [place] and Equipment/Supplies: Were home health services ordered? no If so, what is the name of the agency? n/a  Has the agency set up a time to come to the patient's home? not applicable Were any new equipment or medical supplies ordered?  No What is the name of the medical supply agency? n/a Were you able to get the supplies/equipment? not applicable Do you have any questions related to the use of the equipment or supplies? No  Functional Questionnaire: (I = Independent and D = Dependent) ADLs: Independent  Bathing/Dressing- Independent  Meal Prep- Independent  Eating- Independent  Maintaining continence- Independent  Transferring/Ambulation- Independent  Managing Meds- Independent  Follow up appointments reviewed:   PCP Hospital f/u appt confirmed? Yes  Scheduled to see Dr. Cristie Hem Plotnikov  on 01/19/2020 @ 3:40 pm.  [facility] f/u appt confirmed? No    Are transportation arrangements needed? No   If their condition worsens, is the pt aware to call PCP or go to the Emergency Dept.? Yes  Was the patient provided with contact information for the PCP's office or ED? Yes  Was to pt encouraged to call back with questions or concerns? Yes

## 2020-01-10 ENCOUNTER — Other Ambulatory Visit: Payer: Self-pay | Admitting: Internal Medicine

## 2020-01-10 NOTE — Telephone Encounter (Signed)
potassium chloride SA (KLOR-CON) CR tablet 40 mEq  [place] Phone:  [phone]  Fax:  [phone]     Patient calling because refill for Potassium is due tomorrow. Potassium was increased to 40mg  while she was in the hospital, so patient is asking for an updated script.  Medication on File: N  Last appt: L. [facility] (9.13.21) . Next appt: Hosp f/u 11.3.21

## 2020-01-11 ENCOUNTER — Telehealth: Payer: Self-pay

## 2020-01-11 MED ORDER — POTASSIUM CHLORIDE CRYS ER 20 MEQ PO TBCR: 20.0000 meq | EXTENDED_RELEASE_TABLET | Freq: Two times a day (BID) | ORAL | 0 refills | Status: DC

## 2020-01-11 NOTE — Telephone Encounter (Signed)
Okay to refill.  Thanks.

## 2020-01-11 NOTE — Telephone Encounter (Signed)
Sent [facility].Marland KitchenJohny Chess

## 2020-01-11 NOTE — Telephone Encounter (Signed)
error 

## 2020-01-11 NOTE — Telephone Encounter (Signed)
-----   Message from Cassandria Anger, MD sent at 01/10/2020 11:34 PM EDT ----- Regarding: RE: Hosp F/U Izora Ribas, I agree with your plan. Thanks, AP ----- Message ----- From: Sheral Flow, LPN Sent: [id number]  12:15 PM EDT To: Cassandria Anger, MD Subject: Hosp F/U                                       Spoke with patient for TCM call and patient stated that she feels a lot better but her potassium was low die to nausea and vomiting.  She was advised by the hospital to increase postassium from 20 mEq to 40 mEq.  She stated that she feels so faint.  I did advised that I will send message to you but if things get worse to go back to ER.  Are there any recommendations? Her hospital f/u is scheduled for 01/19/2020 at 3:40p.   Bridget [name]

## 2020-01-19 ENCOUNTER — Encounter: Payer: Self-pay | Admitting: Internal Medicine

## 2020-01-19 ENCOUNTER — Ambulatory Visit (INDEPENDENT_AMBULATORY_CARE_PROVIDER_SITE_OTHER): Payer: 59 | Admitting: Internal Medicine

## 2020-01-19 ENCOUNTER — Other Ambulatory Visit: Payer: Self-pay

## 2020-01-19 VITALS — BP 138/84 | HR 78 | Temp 98.5°F | Wt 323.6 lb

## 2020-01-19 DIAGNOSIS — K5780 Diverticulitis of intestine, part unspecified, with perforation and abscess without bleeding: Secondary | ICD-10-CM | POA: Diagnosis not present

## 2020-01-19 DIAGNOSIS — F32A Depression, unspecified: Secondary | ICD-10-CM

## 2020-01-19 DIAGNOSIS — F419 Anxiety disorder, unspecified: Secondary | ICD-10-CM

## 2020-01-19 DIAGNOSIS — D509 Iron deficiency anemia, unspecified: Secondary | ICD-10-CM | POA: Diagnosis not present

## 2020-01-19 DIAGNOSIS — Z23 Encounter for immunization: Secondary | ICD-10-CM | POA: Diagnosis not present

## 2020-01-19 DIAGNOSIS — I2699 Other pulmonary embolism without acute cor pulmonale: Secondary | ICD-10-CM | POA: Diagnosis not present

## 2020-01-19 MED ORDER — RIVAROXABAN 20 MG PO TABS
20.0000 mg | ORAL_TABLET | Freq: Every day | ORAL | 5 refills | Status: DC
Start: 2020-01-19 — End: 2020-08-14

## 2020-01-19 NOTE — Assessment & Plan Note (Signed)
Meditation suggested

## 2020-01-19 NOTE — Progress Notes (Signed)
Subjective:  Patient ID: Bridget [name], female    DOB: 03/19/1975  Age: 44 y.o. MRN: [id number]  CC: Follow-up (TCM- HOSP F/U)   HPI KILIE RUND presents for a perforated diverticulitis - s/p IV abx. F/u PE, stress, obesity Per hosp d/c:  "Admit date: 12/30/2019 Discharge date: 01/04/2020  Admitting Diagnosis: Acute diverticulitis with microperforation  Discharge Diagnosis     Patient Active Problem List   Diagnosis Date Noted  . Perforated diverticulum 12/31/2019  . GERD (gastroesophageal reflux disease) 09/29/2019  . Constipation 09/29/2019  . Foot pain, right 08/04/2019  . BPPV (benign paroxysmal positional vertigo) 12/04/2018  . Midsternal chest pain 09/16/2018  . Pulmonary embolism ([place]) 07/22/2018  . Anxiety and depression 07/13/2018  . Arm swelling 07/06/2018  . DOE (dyspnea on exertion) 06/30/2018  . HTN (hypertension) 10/29/2017  . Reactive depression (situational) 10/29/2017  . Pruritic condition 09/25/2016  . Urticaria 09/25/2016  . Anemia 04/23/2016  . Term pregnancy 03/20/2016  . Well adult exam 05/06/2014  . Otitis media 12/13/2011  . Fatigue 12/13/2011  . B12 deficiency 12/13/2011  . Vitamin D deficiency 12/13/2011  . Palpitations 08/06/2010  . Syncope and collapse 08/06/2010  . EDEMA 10/14/2007  . UTI 07/21/2007  . LOW BACK PAIN 07/21/2007  . ELEVATED BP 07/21/2007  . Morbid (severe) obesity due to excess calories ([place]) 11/15/2006  . Migraines 11/15/2006    Consultants None  Imaging: Imaging Results (Last 48 hours)  No results found.    Procedures None  Hospital Course:  KATALIN COLLEDGE is a 44yo female [place] PE on xarelto who was transferred from [facility] 10/15 with perforated diverticulitis.  CT abd/pelvis showed sigmoid inflammation with several small foci of pneumoperitoneum throughout the abdomen, consistent with perforated diverticulitis.  She was admitted to the hospital for IV antibiotics (zosyn) and bowel  rest. As pain and leukocytosis improved diet was gradually advanced as tolerated. On 10/19 the patient was voiding well, tolerating diet, having bowel function, ambulating well, pain well controlled, vital signs stable and felt stable for discharge home.  She will go home with 11 days of augmentin to complete 14 day course of antibiotics, and follow up with PCP to arrange referral to gastroenterology for colonoscopy in 6-8 weeks. Patient will follow up as below and knows to call with questions or concerns.    I have personally reviewed the patients medication history on the [place] controlled substance database.    Physical Exam: General: Alert, NAD Heart: regular, rate, and rhythm. Lungs: CTAB, no wheezes, rhonchi, or rales noted. Respiratory effort nonlabored Abd: soft, NT, ND, +BS Psych: A&Ox3 with an appropriate affect.   Allergies as of 01/04/2020   No Known Allergies      "   Outpatient Medications Prior to Visit  Medication Sig Dispense Refill  . acetaminophen (TYLENOL) 500 MG tablet Take 1,000 mg by mouth every 6 (six) hours as needed for mild pain.    Marland Kitchen albuterol (VENTOLIN HFA) 108 (90 Base) MCG/ACT inhaler Inhale 1-2 puffs into the lungs every 6 (six) hours as needed for wheezing or shortness of breath. 18 g 1  . cetirizine (ZYRTEC) 10 MG tablet Take 10 mg by mouth daily as needed for allergies.    . Cholecalciferol (VITAMIN D3) 1.25 MG (50000 UT) CAPS Take 1 capsule by mouth once a week. 12 capsule 3  . clonazePAM (KLONOPIN) 0.5 MG disintegrating tablet Take 1-2 tablets (0.5-1 mg total) by mouth 2 (two) times daily as needed (panic attacks).  60 tablet 1  . Cyanocobalamin (VITAMIN B-12) 1000 MCG SUBL Place 1 tablet (1,000 mcg total) under the tongue daily. 100 tablet 3  . diclofenac Sodium (VOLTAREN) 1 % GEL Apply 2 g topically 4 (four) times daily. 100 g 0  . meclizine (ANTIVERT) 25 MG tablet Take 1 tablet (25 mg total) by mouth 3 (three) times daily as needed for dizziness.  30 tablet 0  . Multiple Vitamin (MULTIVITAMIN) capsule Take 1 capsule by mouth daily.    Marland Kitchen oxyCODONE (OXY IR/ROXICODONE) 5 MG immediate release tablet Take 1 tablet (5 mg total) by mouth every 6 (six) hours as needed for severe pain. 15 tablet 0  . pantoprazole (PROTONIX) 40 MG tablet Take 1 tablet (40 mg total) by mouth daily. 90 tablet 3  . potassium chloride SA (KLOR-CON) 20 MEQ tablet Take 1 tablet (20 mEq total) by mouth 2 (two) times daily. 60 tablet 0  . rivaroxaban (XARELTO) 20 MG TABS tablet Take 1 tablet (20 mg total) by mouth daily with supper. 30 tablet 5  . saccharomyces boulardii (FLORASTOR) 250 MG capsule Take 1 capsule (250 mg total) by mouth 2 (two) times daily. Recommend starting a probiotic.    Marland Kitchen senna-docusate (SENOKOT-S) 8.6-50 MG tablet Take 2 tablets by mouth daily as needed for mild constipation or moderate constipation. 100 tablet 6  . spironolactone (ALDACTONE) 25 MG tablet Take 1 tablet (25 mg total) by mouth daily. 90 tablet 3  . SYMBICORT 160-4.5 MCG/ACT inhaler Inhale 2 puffs by mouth twice daily (Patient taking differently: Inhale 2 puffs into the lungs daily as needed (sob). ) 11 g 0   No facility-administered medications prior to visit.    ROS: Review of Systems  Constitutional: Negative for activity change, appetite change, chills, fatigue and unexpected weight change.  HENT: Negative for congestion, mouth sores and sinus pressure.   Eyes: Negative for visual disturbance.  Respiratory: Negative for cough and chest tightness.   Gastrointestinal: Negative for abdominal pain and nausea.  Genitourinary: Negative for difficulty urinating, frequency and vaginal pain.  Musculoskeletal: Negative for back pain and gait problem.  Skin: Negative for pallor and rash.  Neurological: Negative for dizziness, tremors, weakness, numbness and headaches.  Psychiatric/Behavioral: Negative for confusion and sleep disturbance.    Objective:  BP 138/84 (BP Location: Left Arm)    Pulse 78   Temp 98.5 F (36.9 C) (Oral)   Wt (!) 323 lb 9.6 oz (146.8 kg)   SpO2 98%   BMI 55.55 kg/m   BP Readings from Last 3 Encounters:  01/19/20 138/84  01/04/20 135/80  11/18/19 128/82    Wt Readings from Last 3 Encounters:  01/19/20 (!) 323 lb 9.6 oz (146.8 kg)  12/30/19 (!) 326 lb 4.5 oz (148 kg)  11/18/19 (!) 335 lb (152 kg)    Physical Exam Constitutional:      General: She is not in acute distress.    Appearance: She is well-developed.  HENT:     Head: Normocephalic.     Right Ear: External ear normal.     Left Ear: External ear normal.     Nose: Nose normal.  Eyes:     General:        Right eye: No discharge.        Left eye: No discharge.     Conjunctiva/sclera: Conjunctivae normal.     Pupils: Pupils are equal, round, and reactive to light.  Neck:     Thyroid: No thyromegaly.     Vascular:  No JVD.     Trachea: No tracheal deviation.  Cardiovascular:     Rate and Rhythm: Normal rate and regular rhythm.     Heart sounds: Normal heart sounds.  Pulmonary:     Effort: No respiratory distress.     Breath sounds: No stridor. No wheezing.  Abdominal:     General: Bowel sounds are normal. There is no distension.     Palpations: Abdomen is soft. There is no mass.     Tenderness: There is no abdominal tenderness. There is no guarding or rebound.  Musculoskeletal:        General: No tenderness.     Cervical back: Normal range of motion and neck supple.  Lymphadenopathy:     Cervical: No cervical adenopathy.  Skin:    Findings: No erythema or rash.  Neurological:     Cranial Nerves: No cranial nerve deficit.     Motor: No abnormal muscle tone.     Coordination: Coordination normal.     Deep Tendon Reflexes: Reflexes normal.  Psychiatric:        Behavior: Behavior normal.        Thought Content: Thought content normal.        Judgment: Judgment normal.     Lab Results  Component Value Date   WBC 6.3 01/03/2020   HGB 9.9 (L) 01/03/2020   HCT  29.5 (L) 01/03/2020   PLT 331 01/03/2020   GLUCOSE 110 (H) 01/03/2020   CHOL 153 04/21/2018   TRIG 88.0 04/21/2018   HDL 39.40 04/21/2018   LDLCALC 96 04/21/2018   ALT 13 01/03/2020   AST 15 01/03/2020   NA 140 01/03/2020   K 3.2 (L) 01/03/2020   CL 105 01/03/2020   CREATININE 0.63 01/03/2020   BUN <5 (L) 01/03/2020   CO2 25 01/03/2020   TSH 0.88 07/13/2019   HGBA1C 5.3 10/29/2017    CT ABDOMEN PELVIS W CONTRAST  Result Date: 12/31/2019 CLINICAL DATA:  Abdominal pain X 2 days, vomited X 6 times yesterday EXAM: CT ABDOMEN AND PELVIS WITH CONTRAST TECHNIQUE: Multidetector CT imaging of the abdomen and pelvis was performed using the standard protocol following bolus administration of intravenous contrast. CONTRAST:  123mL OMNIPAQUE IOHEXOL 300 MG/ML  SOLN COMPARISON:  Ultrasound renal 08/13/2018, CT abdomen pelvis 03/21/2002 report without imaging. FINDINGS: Lower chest: No acute abnormality. Hepatobiliary: No focal liver abnormality. No gallstones, gallbladder wall thickening, or pericholecystic fluid. No biliary dilatation. Pancreas: No focal lesion. Normal pancreatic contour. No surrounding inflammatory changes. No main pancreatic ductal dilatation. Spleen: Normal in size without focal abnormality. A splenule is identified. Adrenals/Urinary Tract: No adrenal nodule bilaterally. Bilateral kidneys enhance symmetrically. 1.9 cm fluid density lesion within the right kidney likely represents a simple renal cyst. There is a 3 mm left inferior renal pole calculus. No hydronephrosis. No hydroureter. The urinary bladder is decompressed and unremarkable. Stomach/Bowel: Stomach is within normal limits. No evidence of small bowel wall thickening or dilatation. Bowel wall thickening and pericolonic fat stranding of mid sigmoid colon. Appendix appears normal. Vascular/Lymphatic: No significant vascular findings are present. No enlarged abdominal or pelvic lymph nodes. Reproductive: Uterus and bilateral  adnexa are unremarkable. Other: Several foci of free intraperitoneal gas scattered throughout the abdomen and pelvis. No organized fluid collection. No large volume ascites. Musculoskeletal: Tiny fat containing umbilical hernia. No suspicious lytic or blastic osseous lesions. No acute displaced fracture. Multilevel degenerative changes of the spine. IMPRESSION: 1. Perforated sigmoid diverticulitis. No organized fluid collection, no large volume ascites.  2. Nonobstructive 3 mm left nephrolithiasis. These results were called by telephone at the time of interpretation on 12/31/2019 at 1:14 am to provider Dr. Florina Ou, who verbally acknowledged these results. Electronically Signed   By: Iven Finn M.D.   On: 12/31/2019 01:22    Assessment & Plan:    Walker Kehr, MD

## 2020-01-19 NOTE — Patient Instructions (Addendum)
Meditation, self hypnosis

## 2020-01-19 NOTE — Assessment & Plan Note (Signed)
Sleeve procedure is planned at [facility]

## 2020-01-19 NOTE — Assessment & Plan Note (Addendum)
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

## 2020-01-19 NOTE — Assessment & Plan Note (Addendum)
Perforated sigmoid diverticulitis. No organized fluid collection, no large volume ascites. S/p IV abx GI ref

## 2020-01-19 NOTE — Assessment & Plan Note (Signed)
CBC, iron

## 2020-01-20 LAB — COMPREHENSIVE METABOLIC PANEL
ALT: 12 U/L (ref 0–35)
AST: 19 U/L (ref 0–37)
Albumin: 4.2 g/dL (ref 3.5–5.2)
Alkaline Phosphatase: 43 U/L (ref 39–117)
BUN: 13 mg/dL (ref 6–23)
CO2: 26 mEq/L (ref 19–32)
Calcium: 9.7 mg/dL (ref 8.4–10.5)
Chloride: 102 mEq/L (ref 96–112)
Creatinine, Ser: 0.73 mg/dL (ref 0.40–1.20)
GFR: 101.3 mL/min (ref >60.00)
Glucose, Bld: 86 mg/dL (ref 70–99)
Potassium: 3.8 mEq/L (ref 3.5–5.1)
Sodium: 137 mEq/L (ref 135–145)
Total Bilirubin: 0.9 mg/dL (ref 0.2–1.2)
Total Protein: 7.4 g/dL (ref 6.0–8.3)

## 2020-01-20 LAB — CBC WITH DIFFERENTIAL/PLATELET
Basophils Absolute: 0.1 10*3/uL (ref 0.0–0.1)
Basophils Relative: 0.9 % (ref 0.0–3.0)
Eosinophils Absolute: 0.2 10*3/uL (ref 0.0–0.7)
Eosinophils Relative: 2 % (ref 0.0–5.0)
HCT: 39.6 % (ref 36.0–46.0)
Hemoglobin: 13 g/dL (ref 12.0–15.0)
Lymphocytes Relative: 28.2 % (ref 12.0–46.0)
Lymphs Abs: 2.5 10*3/uL (ref 0.7–4.0)
MCHC: 32.7 g/dL (ref 30.0–36.0)
MCV: 90.8 fl (ref 78.0–100.0)
Monocytes Absolute: 0.6 10*3/uL (ref 0.1–1.0)
Monocytes Relative: 7.1 % (ref 3.0–12.0)
Neutro Abs: 5.5 10*3/uL (ref 1.4–7.7)
Neutrophils Relative %: 61.8 % (ref 43.0–77.0)
Platelets: 482 10*3/uL — ABNORMAL HIGH (ref 150.0–400.0)
RBC: 4.37 Mil/uL (ref 3.87–5.11)
RDW: 14.7 % (ref 11.5–15.5)
WBC: 8.9 10*3/uL (ref 4.0–10.5)

## 2020-01-20 LAB — IRON,TIBC AND FERRITIN PANEL
%SAT: 29 % (calc) (ref 16–45)
Ferritin: 22 ng/mL (ref 16–232)
Iron: 94 ug/dL (ref 40–190)
TIBC: 327 mcg/dL (calc) (ref 250–450)

## 2020-02-11 ENCOUNTER — Other Ambulatory Visit: Payer: Self-pay | Admitting: Internal Medicine

## 2020-02-24 ENCOUNTER — Ambulatory Visit (INDEPENDENT_AMBULATORY_CARE_PROVIDER_SITE_OTHER): Payer: 59 | Admitting: Internal Medicine

## 2020-02-24 ENCOUNTER — Encounter: Payer: Self-pay | Admitting: Internal Medicine

## 2020-02-24 ENCOUNTER — Ambulatory Visit: Payer: 59 | Admitting: Internal Medicine

## 2020-02-24 ENCOUNTER — Telehealth: Payer: Self-pay | Admitting: Internal Medicine

## 2020-02-24 ENCOUNTER — Other Ambulatory Visit: Payer: Self-pay

## 2020-02-24 ENCOUNTER — Other Ambulatory Visit: Payer: 59

## 2020-02-24 VITALS — BP 148/92 | HR 75 | Temp 98.1°F | Ht 64.0 in | Wt 318.0 lb

## 2020-02-24 DIAGNOSIS — R002 Palpitations: Secondary | ICD-10-CM

## 2020-02-24 DIAGNOSIS — I10 Essential (primary) hypertension: Secondary | ICD-10-CM

## 2020-02-24 DIAGNOSIS — R0789 Other chest pain: Secondary | ICD-10-CM | POA: Diagnosis not present

## 2020-02-24 NOTE — Telephone Encounter (Signed)
[facility]   Caller states she is experiencing some chest pain. No other s/s.  [facility] advised: See PCP within 24 Hours   Patient understood and complied.    Patient is scheduled for 02/24/20

## 2020-02-24 NOTE — Patient Instructions (Signed)
Your EKG looks normal and not changed from September 2021. We are checking the labs today and if all is normal you are fine to do the surgery as planned next week.   You can try taking lactaid over the counter in case the dairy is upsetting your GI system.

## 2020-02-24 NOTE — Telephone Encounter (Signed)
[facility]   Caller states she is preparing for weigh loss sx on 12/16 drinking shakes on specific diet for it. Took some GasX last night and started to have pain in her chest, stomach and back. Also having indigestion. She was hospitalized for dx of diverticulitis in early November.   [facility] advised: Home Care   Patient understood and complied.

## 2020-02-24 NOTE — Progress Notes (Signed)
° °  Subjective:   Patient ID: EVITA MERIDA, female    DOB: Dec 07, 1975, 44 y.o.   MRN: [id number]  HPI The patient is a 44 YO female coming in for chest discomfort. She was eating and had some pressure. Took gas-x which helped some. She is eating differently due to upcoming weight loss procedure to get ready for this. Denies headaches. Did also have some left arm tingling which was concerning to her. Non-smoker.   Review of Systems  Constitutional: Negative.   HENT: Negative.   Eyes: Negative.   Respiratory: Positive for chest tightness. Negative for cough and shortness of breath.   Cardiovascular: Negative for chest pain, palpitations and leg swelling.  Gastrointestinal: Negative for abdominal distention, abdominal pain, constipation, diarrhea, nausea and vomiting.  Musculoskeletal: Negative.   Skin: Negative.   Neurological: Positive for numbness.  Psychiatric/Behavioral: Negative.     Objective:  Physical Exam Constitutional:      Appearance: She is well-developed and well-nourished.  HENT:     Head: Normocephalic and atraumatic.  Eyes:     Extraocular Movements: EOM normal.  Cardiovascular:     Rate and Rhythm: Normal rate and regular rhythm.  Pulmonary:     Effort: Pulmonary effort is normal. No respiratory distress.     Breath sounds: Normal breath sounds. No wheezing or rales.  Abdominal:     General: Bowel sounds are normal. There is no distension.     Palpations: Abdomen is soft.     Tenderness: There is no abdominal tenderness. There is no rebound.  Musculoskeletal:        General: No edema.     Cervical back: Normal range of motion.  Skin:    General: Skin is warm and dry.  Neurological:     Mental Status: She is alert and oriented to person, place, and time.     Coordination: Coordination normal.  Psychiatric:        Mood and Affect: Mood and affect normal.     Vitals:   02/24/20 1612  BP: (!) 148/92  Pulse: 75  Temp: 98.1 F (36.7 C)  TempSrc:  Oral  SpO2: 98%  Weight: (!) 318 lb (144.2 kg)  Height: 5\' 4"  (1.626 m)   EKG: Rate 64, axis normal, interval normal, sinus, no st or t wave changes, no significant change compared to 11/2019  This visit occurred during the SARS-CoV-2 public health emergency.  Safety protocols were in place, including screening questions prior to the visit, additional usage of staff PPE, and extensive cleaning of exam room while observing appropriate contact time as indicated for disinfecting solutions.   Assessment & Plan:

## 2020-02-24 NOTE — Telephone Encounter (Addendum)
    Patient calling to report she is having chest pain  Call transferred to [facility]  (There is also another encounter from today)

## 2020-02-25 ENCOUNTER — Telehealth: Payer: Self-pay | Admitting: Internal Medicine

## 2020-02-25 ENCOUNTER — Encounter: Payer: Self-pay | Admitting: Internal Medicine

## 2020-02-25 ENCOUNTER — Encounter (HOSPITAL_BASED_OUTPATIENT_CLINIC_OR_DEPARTMENT_OTHER): Payer: Self-pay | Admitting: *Deleted

## 2020-02-25 ENCOUNTER — Ambulatory Visit: Payer: 59 | Admitting: Internal Medicine

## 2020-02-25 ENCOUNTER — Emergency Department (HOSPITAL_BASED_OUTPATIENT_CLINIC_OR_DEPARTMENT_OTHER): Payer: 59

## 2020-02-25 ENCOUNTER — Emergency Department (HOSPITAL_BASED_OUTPATIENT_CLINIC_OR_DEPARTMENT_OTHER)
Admission: EM | Admit: 2020-02-25 | Discharge: 2020-02-25 | Disposition: A | Discharge status: Home / Self Care | Payer: 59 | Attending: Emergency Medicine | Admitting: Emergency Medicine

## 2020-02-25 ENCOUNTER — Other Ambulatory Visit: Payer: Self-pay

## 2020-02-25 DIAGNOSIS — R0789 Other chest pain: Secondary | ICD-10-CM | POA: Diagnosis present

## 2020-02-25 DIAGNOSIS — I10 Essential (primary) hypertension: Secondary | ICD-10-CM | POA: Diagnosis not present

## 2020-02-25 DIAGNOSIS — R202 Paresthesia of skin: Secondary | ICD-10-CM | POA: Insufficient documentation

## 2020-02-25 DIAGNOSIS — Z7901 Long term (current) use of anticoagulants: Secondary | ICD-10-CM | POA: Insufficient documentation

## 2020-02-25 DIAGNOSIS — Z86711 Personal history of pulmonary embolism: Secondary | ICD-10-CM | POA: Insufficient documentation

## 2020-02-25 DIAGNOSIS — R0602 Shortness of breath: Secondary | ICD-10-CM | POA: Insufficient documentation

## 2020-02-25 LAB — BASIC METABOLIC PANEL
Anion gap: 11 (ref 5–15)
BUN/Creatinine Ratio: 19 (ref 9–23)
BUN: 13 mg/dL (ref 6–24)
BUN: 11 mg/dL (ref 6–20)
CO2: 21 mmol/L (ref 20–29)
CO2: 23 mmol/L (ref 22–32)
Calcium: 9.6 mg/dL (ref 8.7–10.2)
Calcium: 9.4 mg/dL (ref 8.9–10.3)
Chloride: 103 mmol/L (ref 96–106)
Chloride: 101 mmol/L (ref 98–111)
Creatinine, Ser: 0.69 mg/dL (ref 0.57–1.00)
Creatinine, Ser: 0.68 mg/dL (ref 0.44–1.00)
GFR calc Af Amer: 124 mL/min/{1.73_m2} (ref >59)
GFR calc non Af Amer: 108 mL/min/{1.73_m2} (ref >59)
GFR, Estimated: >60 mL/min (ref >60)
Glucose, Bld: 106 mg/dL — ABNORMAL HIGH (ref 70–99)
Glucose: 105 mg/dL — ABNORMAL HIGH (ref 65–99)
Potassium: 4.1 mmol/L (ref 3.5–5.2)
Potassium: 3.7 mmol/L (ref 3.5–5.1)
Sodium: 138 mmol/L (ref 134–144)
Sodium: 135 mmol/L (ref 135–145)

## 2020-02-25 LAB — CBC
HCT: 36.7 % (ref 36.0–46.0)
Hemoglobin: 12.1 g/dL (ref 12.0–15.0)
MCH: 30.4 pg (ref 26.0–34.0)
MCHC: 33 g/dL (ref 30.0–36.0)
MCV: 92.2 fL (ref 80.0–100.0)
Platelets: 415 10*3/uL — ABNORMAL HIGH (ref 150–400)
RBC: 3.98 MIL/uL (ref 3.87–5.11)
RDW: 14 % (ref 11.5–15.5)
WBC: 7.6 10*3/uL (ref 4.0–10.5)
nRBC: 0 % (ref 0.0–0.2)

## 2020-02-25 LAB — D-DIMER, QUANTITATIVE: D-Dimer, Quant: <0.27 ug/mL-FEU (ref 0.00–0.50)

## 2020-02-25 LAB — TROPONIN I (HIGH SENSITIVITY): Troponin I (High Sensitivity): 3 ng/L (ref <18)

## 2020-02-25 LAB — PREGNANCY, URINE: Preg Test, Ur: NEGATIVE

## 2020-02-25 MED ORDER — DICLOFENAC SODIUM 1 % EX GEL: 2.0000 g | Freq: Four times a day (QID) | CUTANEOUS | 0 refills | Status: DC

## 2020-02-25 MED ORDER — ALUM & MAG HYDROXIDE-SIMETH 200-200-20 MG/5ML PO SUSP
30.0000 mL | Freq: Once | ORAL | Status: AC
  Administered 2020-02-25: 30 mL via ORAL
  Filled 2020-02-25: qty 30

## 2020-02-25 MED ORDER — ALUM & MAG HYDROXIDE-SIMETH 400-400-40 MG/5ML PO SUSP
5.0000 mL | Freq: Four times a day (QID) | ORAL | 0 refills | Status: DC | PRN
Start: 2020-02-25 — End: 2020-08-08

## 2020-02-25 MED ORDER — LIDOCAINE VISCOUS HCL 2 % MT SOLN
15.0000 mL | Freq: Once | OROMUCOSAL | Status: AC
  Administered 2020-02-25: 15 mL via ORAL
  Filled 2020-02-25: qty 15

## 2020-02-25 NOTE — ED Provider Notes (Signed)
[place] EMERGENCY DEPARTMENT Provider Note   CSN: [id number] Arrival date & time: 02/25/20  1510     History Chief Complaint  Patient presents with  . Chest Pain  . Shortness of Breath    Bridget [name] is a 44 y.o. female presenting for evaluation of chest pain and shortness of breath.  Patient states that the past 2 days, she has had persistent chest pain.  It is in the center of her chest, worse with inspiration.  It is also worse with movement.  Does not radiate anywhere.  She reports associated shortness of breath, this is worse with exertion.  Patient states when she woke up this morning, she had pain in her arm and tingling in her hand, but this improved and she moved her arm and has completely resolved.  Patient states she has had frequent episodes of chest pain and shortness of breath in the past year and a half, ever since she presumably had a PE in April 2020.  She is currently on Xarelto for this, has not missed any doses.  She denies recent fevers, chills, cough, nausea, vomiting, abdominal pain, urinary symptoms, abnormal bowel movements.  She denies sick contacts.  She denies recent travel, surgeries, immobilization, history of cancer, OCP use.  She does report multiple family members who have died due to blood clots.  Additional history obtained from chart review.  Patient has been seen multiple times for chest pain and shortness of breath in the past 18 months.  Reviewed negative CTA findings, although patient remains on Xarelto.  Additional history of obesity, GERD, hypertension  HPI     Past Medical History:  Diagnosis Date  . Constipation   . Edema   . Headache(784.0)   . Morbid obesity ([place])   . Pulmonary embolism [facility])     Patient Active Problem List   Diagnosis Date Noted  . Perforated diverticulum 12/31/2019  . GERD (gastroesophageal reflux disease) 09/29/2019  . Constipation 09/29/2019  . Foot pain, right 08/04/2019  . BPPV (benign  paroxysmal positional vertigo) 12/04/2018  . Midsternal chest pain 09/16/2018  . Pulmonary embolism ([place]) 07/22/2018  . Anxiety and depression 07/13/2018  . Arm swelling 07/06/2018  . DOE (dyspnea on exertion) 06/30/2018  . HTN (hypertension) 10/29/2017  . Reactive depression (situational) 10/29/2017  . Pruritic condition 09/25/2016  . Urticaria 09/25/2016  . Anemia 04/23/2016  . Well adult exam 05/06/2014  . Otitis media 12/13/2011  . Fatigue 12/13/2011  . B12 deficiency 12/13/2011  . Vitamin D deficiency 12/13/2011  . Palpitations 08/06/2010  . Syncope and collapse 08/06/2010  . EDEMA 10/14/2007  . UTI 07/21/2007  . LOW BACK PAIN 07/21/2007  . ELEVATED BP 07/21/2007  . Morbid (severe) obesity due to excess calories ([place]) 11/15/2006  . Migraines 11/15/2006    Past Surgical History:  Procedure Laterality Date  . BREAST REDUCTION SURGERY    . BREAST SURGERY  2005   reduction  . CESAREAN SECTION N/A 03/20/2016   Procedure: CESAREAN SECTION;  Surgeon: Christophe Louis, MD;  Location: [place];  Service: Obstetrics;  Laterality: N/A;  . TUBAL LIGATION Bilateral 03/20/2016   Procedure: BILATERAL TUBAL LIGATION;  Surgeon: Christophe Louis, MD;  Location: [place];  Service: Obstetrics;  Laterality: Bilateral;     OB History    Gravida  7   Para  2   Term  2   Preterm      AB  5   Living  2  SAB  3   IAB  2   Ectopic      Multiple  0   Live Births  2           Family History  Problem Relation Age of Onset  . Deep vein thrombosis Father   . Diabetes Father   . Hypertension Mother   . Hypertension Other   . Diabetes Paternal Aunt   . Heart disease Maternal Grandmother   . Heart disease Maternal Grandfather   . Diabetes Paternal Grandmother     Social History   Tobacco Use  . Smoking status: Never Smoker  . Smokeless tobacco: Never Used  Substance Use Topics  . Alcohol use: Yes    Comment: occasionally  . Drug use: No    Home  Medications Prior to Admission medications   Medication Sig Start Date End Date Taking? Authorizing Provider  acetaminophen (TYLENOL) 500 MG tablet Take 1,000 mg by mouth every 6 (six) hours as needed for mild pain.    [provider]  albuterol (VENTOLIN HFA) 108 (90 Base) MCG/ACT inhaler Inhale 1-2 puffs into the lungs every 6 (six) hours as needed for wheezing or shortness of breath. 07/02/19   Marrian Salvage, FNP  alum & mag hydroxide-simeth (MAALOX ADVANCED MAX ST) 102-585-27 MG/5ML suspension Take 5 mLs by mouth every 6 (six) hours as needed for indigestion. 02/25/20   Suezette Lafave, PA-C  cetirizine (ZYRTEC) 10 MG tablet Take 10 mg by mouth daily as needed for allergies.    [provider]  Cholecalciferol (VITAMIN D3) 1.25 MG (50000 UT) CAPS Take 1 capsule by mouth once a week. 07/14/19   Plotnikov, Evie Lacks, MD  clonazePAM (KLONOPIN) 0.5 MG disintegrating tablet Take 1-2 tablets (0.5-1 mg total) by mouth 2 (two) times daily as needed (panic attacks). 09/29/19   Plotnikov, Evie Lacks, MD  Cyanocobalamin (VITAMIN B-12) 1000 MCG SUBL Place 1 tablet (1,000 mcg total) under the tongue daily. 08/04/19   Plotnikov, Evie Lacks, MD  diclofenac Sodium (VOLTAREN) 1 % GEL Apply 2 g topically 4 (four) times daily. 02/25/20   Maritta Kief, PA-C  meclizine (ANTIVERT) 25 MG tablet Take 1 tablet (25 mg total) by mouth 3 (three) times daily as needed for dizziness. 12/04/18   Hoyt Koch, MD  Multiple Vitamin (MULTIVITAMIN) capsule Take 1 capsule by mouth daily.    [provider]  oxyCODONE (OXY IR/ROXICODONE) 5 MG immediate release tablet Take 1 tablet (5 mg total) by mouth every 6 (six) hours as needed for severe pain. 01/04/20   Meuth, Brooke A, PA-C  pantoprazole (PROTONIX) 40 MG tablet Take 1 tablet (40 mg total) by mouth daily. 09/29/19   Plotnikov, Evie Lacks, MD  potassium chloride SA (KLOR-CON) 20 MEQ tablet Take 1 tablet by mouth twice daily 02/14/20    Plotnikov, Evie Lacks, MD  rivaroxaban (XARELTO) 20 MG TABS tablet Take 1 tablet (20 mg total) by mouth daily with supper. 01/19/20   Plotnikov, Evie Lacks, MD  saccharomyces boulardii (FLORASTOR) 250 MG capsule Take 1 capsule (250 mg total) by mouth 2 (two) times daily. Recommend starting a probiotic. 01/04/20   Meuth, Brooke A, PA-C  senna-docusate (SENOKOT-S) 8.6-50 MG tablet Take 2 tablets by mouth daily as needed for mild constipation or moderate constipation. 09/29/19 09/28/20  Plotnikov, Evie Lacks, MD  spironolactone (ALDACTONE) 25 MG tablet Take 1 tablet (25 mg total) by mouth daily. 11/18/19   Nahser, Wonda Cheng, MD  SYMBICORT 160-4.5 MCG/ACT inhaler Inhale 2 puffs  by mouth twice daily Patient taking differently: Inhale 2 puffs into the lungs daily as needed (sob). 12/03/19   Plotnikov, Evie Lacks, MD    Allergies    Patient has no known allergies.  Review of Systems   Review of Systems  Respiratory: Positive for shortness of breath.   Cardiovascular: Positive for chest pain.  Hematological: Bruises/bleeds easily.  All other systems reviewed and are negative.   Physical Exam Updated Vital Signs BP 125/67   Pulse 74   Temp 98.2 F (36.8 C) (Oral)   Resp 17   Ht 5\' 4"  (1.626 m)   Wt (!) 144 kg   LMP 02/20/2020   SpO2 99%   BMI 54.49 kg/m   Physical Exam Vitals and nursing note reviewed.  Constitutional:      General: She is not in acute distress.    Appearance: She is well-developed and well-nourished. She is obese.     Comments: Resting in the bed in no acute distress  HENT:     Head: Normocephalic and atraumatic.  Eyes:     Extraocular Movements: EOM normal.     Conjunctiva/sclera: Conjunctivae normal.     Pupils: Pupils are equal, round, and reactive to light.  Cardiovascular:     Rate and Rhythm: Normal rate and regular rhythm.     Pulses: Normal pulses and intact distal pulses.  Pulmonary:     Effort: Pulmonary effort is normal. No respiratory distress.     Breath  sounds: Normal breath sounds. No wheezing.     Comments: Tenderness palpation of the anterior chest wall, mostly along the right sternal border. Chest:     Chest wall: Tenderness present.  Abdominal:     General: There is no distension.     Palpations: Abdomen is soft. There is no mass.     Tenderness: There is no abdominal tenderness. There is no guarding or rebound.  Musculoskeletal:        General: Normal range of motion.     Cervical back: Normal range of motion and neck supple.  Skin:    General: Skin is warm and dry.     Capillary Refill: Capillary refill takes less than 2 seconds.  Neurological:     Mental Status: She is alert and oriented to person, place, and time.  Psychiatric:        Mood and Affect: Mood and affect normal.     ED Results / Procedures / Treatments   Labs (all labs ordered are listed, but only abnormal results are displayed) Labs Reviewed  BASIC METABOLIC PANEL - Abnormal; Notable for the following components:      Result Value   Glucose, Bld 106 (*)    All other components within normal limits  CBC - Abnormal; Notable for the following components:   Platelets 415 (*)    All other components within normal limits  PREGNANCY, URINE  D-DIMER, QUANTITATIVE (NOT AT [facility])  TROPONIN I (HIGH SENSITIVITY)    EKG EKG Interpretation  Date/Time:  Friday February 25 2020 15:14:35 EST Ventricular Rate:  81 PR Interval:  142 QRS Duration: 76 QT Interval:  360 QTC Calculation: 418 R Axis:   40 Text Interpretation: Normal sinus rhythm Nonspecific ST and T wave abnormality Abnormal ECG Confirmed by Madalyn Rob [phone]) on 02/25/2020 3:35:00 PM   Radiology DG Chest 2 View  Result Date: 02/25/2020 CLINICAL DATA:  Chest pressure for 1-2 days. Pain extends into the back and down the left arm. EXAM: CHEST -  2 VIEW COMPARISON:  08/27/2019 and older studies. FINDINGS: The heart size and mediastinal contours are within normal limits. Both lungs are clear. No  pleural effusion or pneumothorax. Thoracolumbar scoliosis.  Skeletal structures are intact. IMPRESSION: No active cardiopulmonary disease. Electronically Signed   By: Lajean Manes M.D.   On: 02/25/2020 16:37    Procedures Procedures (including critical care time)  Medications Ordered in ED Medications  alum & mag hydroxide-simeth (MAALOX/MYLANTA) 200-200-20 MG/5ML suspension 30 mL (30 mLs Oral Given 02/25/20 1619)    And  lidocaine (XYLOCAINE) 2 % viscous mouth solution 15 mL (15 mLs Oral Given 02/25/20 1619)    ED Course  I have reviewed the triage vital signs and the nursing notes.  Pertinent labs & imaging results that were available during my care of the patient were reviewed by me and considered in my medical decision making (see chart for details).    MDM Rules/Calculators/A&P                          Patient presenting for evaluation of chest pain and shortness of breath.  On exam, patient appears nontoxic.  Chest pain is reproducible with palpation of the chest wall, consider MSK pain.  Additionally, patient feels she may have a GERD component, will give GI cocktail.  However patient is most concerned that she has a PE, even though she is already on blood thinner.  While patient had a negative CTA, she did have an elevated dimer at that time, which is is normalized.  As such, will obtain labs to ensure no emergent condition, chest pain, including dimer to rule out PE.  Labs interpreted by me, overall reassuring.  Troponin negative.  Dimer negative.  Chest x-ray viewed interpreted by me, no pneumonia pneumothorax, effusion.  EKG nonischemic.  On reassessment after GI cocktail, patient reports improvement, although not complete resolution of pain.  I discussed possible causes including GI cause such as GERD, MSK cause, costochondritis.  Discussed symptomatic treatment and follow-up with PCP.  Discussed that at this time there does not appear to be an acute/emergent cause for her chest  pain requiring hospitalization.  At this time, patient appears safe for discharge.  Return precautions given.  Patient states she understands and agrees to plan.  Final Clinical Impression(s) / ED Diagnoses Final diagnoses:  Atypical chest pain    Rx / DC Orders ED Discharge Orders         Ordered    alum & mag hydroxide-simeth (MAALOX ADVANCED MAX ST) 358-251-89 MG/5ML suspension  Every 6 hours PRN        02/25/20 1745    diclofenac Sodium (VOLTAREN) 1 % GEL  4 times daily        02/25/20 1745           Franchot Heidelberg, PA-C 02/25/20 1829    Lucrezia Starch, MD 02/25/20 2106

## 2020-02-25 NOTE — Assessment & Plan Note (Signed)
EKG done and no change from prior. Checking labs and okay to proceed with surgery. Advised to use gas-x prn and can try lactaid as the dairy content in her cheeses etc could be causing symptoms.

## 2020-02-25 NOTE — ED Notes (Signed)
Staff @bedside  w/ u/s for IV placement

## 2020-02-25 NOTE — ED Triage Notes (Signed)
Chest pain and sob x 2 days. She was seen by her MD yesterday and had a normal EKG. States she feels she has PE. She takes Xarelto for previous PE.

## 2020-02-25 NOTE — Telephone Encounter (Signed)
Patient called and said that the pressure was getting worse. She said she thinks it is gas and was wondering if Dr. Sharlet Salina could recommend anything else. She said sometimes the pain goes to her back.

## 2020-02-25 NOTE — ED Notes (Signed)
Patient transported to X-ray 

## 2020-02-25 NOTE — Discharge Instructions (Signed)
Take tylenol as needed for pain.  You may try the liquid medicine to help with stomach pain that could be contributing to your chest pain. Use the gel on your chest to help with pain. Follow-up with your primary care doctor as needed for further evaluation of your symptoms. Return to the emergency room if you develop fevers, difficulty breathing, any new, worsening, or concerning symptoms.

## 2020-02-28 NOTE — Telephone Encounter (Signed)
Pt informed of below.  

## 2020-02-28 NOTE — Telephone Encounter (Addendum)
Pt informed of below. She is going to increase her pantoprazole to 1 bid and is having surgery on 03/02/20    She wants to know if her diverticulitis could be causing her sxs and if she could possibly have developed a hernia?

## 2020-02-28 NOTE — Telephone Encounter (Signed)
Her symptoms do not sound consistent with diverticulitis and did not feel like a hernia at visit.

## 2020-02-28 NOTE — Telephone Encounter (Signed)
Can use gas-x otc. Can also increase protonix to twice a day to see if this helps.

## 2020-03-07 ENCOUNTER — Telehealth: Payer: Self-pay | Admitting: Internal Medicine

## 2020-03-07 NOTE — Telephone Encounter (Signed)
[facility]  Caller states noticed her heart rate ranging 54 to 65 today.no pain. breathing normally. taking oxycodone r/t a procedure. declined offer of triage at current time. per drugs.com oxycodone can cause bradycardia and hypotension. caller states will monitor and call back if needed.   Patient declined triage.

## 2020-03-22 ENCOUNTER — Encounter: Payer: Self-pay | Admitting: Cardiovascular Disease

## 2020-03-22 NOTE — Progress Notes (Signed)
This encounter was created in error - please disregard.

## 2020-03-23 ENCOUNTER — Encounter: Payer: 59 | Admitting: Cardiovascular Disease

## 2020-03-28 ENCOUNTER — Other Ambulatory Visit: Payer: 59

## 2020-04-12 ENCOUNTER — Ambulatory Visit (INDEPENDENT_AMBULATORY_CARE_PROVIDER_SITE_OTHER): Payer: 59 | Admitting: Gastroenterology

## 2020-04-12 ENCOUNTER — Encounter: Payer: Self-pay | Admitting: Gastroenterology

## 2020-04-12 ENCOUNTER — Other Ambulatory Visit: Payer: Self-pay

## 2020-04-12 ENCOUNTER — Telehealth: Payer: Self-pay

## 2020-04-12 VITALS — BP 110/70 | HR 60 | Ht 64.0 in | Wt 306.4 lb

## 2020-04-12 DIAGNOSIS — K5732 Diverticulitis of large intestine without perforation or abscess without bleeding: Secondary | ICD-10-CM

## 2020-04-12 MED ORDER — SUPREP BOWEL PREP KIT 17.5-3.13-1.6 GM/177ML PO SOLN: 1.0000 | ORAL | 0 refills | Status: DC

## 2020-04-12 NOTE — Telephone Encounter (Signed)
   EMIE SOMMERFELD 04/23/1975 [id number]  Dear Dr Alain Marion:  We have scheduled the above named patient for a colonoscopy procedure. Our records show that she is on anticoagulation therapy.  Please advise as to whether the patient may come off their therapy of Xarelto for 2 days prior to their procedure which is scheduled for 05-11-2020.  Please route your response to Leanne Chang, CMA or fax response to [phone].  Sincerely,    [facility]

## 2020-04-12 NOTE — H&P (View-Only) (Signed)
HPI: This is a very pleasant 45 year old woman who was referred to me by [facility] surgery  She had lower abdominal pain, elevated white count, CT scan showing sigmoid diverticulitis with some foci of free air throughout her abdomen about 3 months ago.  She spent 3 days in the hospital and eventually was discharged in good condition.  She tells me that her abdominal pains have never happened before that episode and have not happened since the episode either.  She does have mild chronic constipation for which over-the-counter agents worked very well.  She has battled her weight for much of her life, morbidly obese.  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  Colon cancer does not run in her family but she believes her mother had a colon polyp  She does not have overt GI bleeding.  She has been on Xarelto for 2 years.  This was started for suspicion of pulmonary embolus.  She has never been proven to have a pulmonary embolus and there is considerable doubt that she did.  Her symptoms might be explained by Covid infection stent.  She is planning to discuss coming off the Xarelto with her primary care physician who manages the medicine. \  Old Data Reviewed:  CT scan abdomen pelvis with IV and oral contrast October 2021 impression reads "perforated sigmoid diverticulitis.  No organized fluid collection, no large volume ascites."  She was briefly admitted to the hospital for IV Zosyn and bowel rest.  Her pain and leukocytosis improved and she was eventually discharged to complete 11 more days of antibiotics for complete 14-day course of antibiotics.  She was recommended to have eventual colonoscopy  Blood work January 2022 through the [facility] system showed a normal CBC and normal complete metabolic profile  Blood work December 2021 normal CBC, normal basic metabolic profile  Urine pregnancy test  December 2021 was negative  She underwent a bariatric sleeve gastrectomy for morbid obesity BMI 54 1 month ago at [facility]    Review of systems: Pertinent positive and negative review of systems were noted in the above HPI section. All other review negative.   Past Medical History:  Diagnosis Date  . Constipation   . Edema   . Headache(784.0)   . Morbid obesity ([place])   . Pulmonary embolism [facility])     Past Surgical History:  Procedure Laterality Date  . BREAST REDUCTION SURGERY    . BREAST SURGERY  2005   reduction  . CESAREAN SECTION N/A 03/20/2016   Procedure: CESAREAN SECTION;  Surgeon: Christophe Louis, MD;  Location: [place];  Service: Obstetrics;  Laterality: N/A;  . TUBAL LIGATION Bilateral 03/20/2016   Procedure: BILATERAL TUBAL LIGATION;  Surgeon: Christophe Louis, MD;  Location: [place];  Service: Obstetrics;  Laterality: Bilateral;    Current Outpatient Medications  Medication Sig Dispense Refill  . acetaminophen (TYLENOL) 500 MG tablet Take 1,000 mg by mouth every 6 (six) hours as needed for mild pain.    Marland Kitchen albuterol (VENTOLIN HFA) 108 (90 Base) MCG/ACT inhaler Inhale 1-2 puffs into the lungs every 6 (six) hours as needed for wheezing or shortness of breath. 18 g 1  . alum & mag hydroxide-simeth (MAALOX ADVANCED MAX ST) 694-854-62 MG/5ML suspension Take 5 mLs by mouth every 6 (six) hours as needed for indigestion. 355 mL 0  . cetirizine (ZYRTEC) 10 MG tablet Take 10 mg by mouth  daily as needed for allergies.    . Cholecalciferol (VITAMIN D3) 1.25 MG (50000 UT) CAPS Take 1 capsule by mouth once a week. 12 capsule 3  . clonazePAM (KLONOPIN) 0.5 MG disintegrating tablet Take 1-2 tablets (0.5-1 mg total) by mouth 2 (two) times daily as needed (panic attacks). 60 tablet 1  . Cyanocobalamin (VITAMIN B-12) 1000 MCG SUBL Place 1 tablet (1,000 mcg total) under the tongue daily. 100 tablet 3  . diclofenac Sodium (VOLTAREN) 1 % GEL Apply 2 g topically 4 (four) times  daily. 100 g 0  . meclizine (ANTIVERT) 25 MG tablet Take 1 tablet (25 mg total) by mouth 3 (three) times daily as needed for dizziness. 30 tablet 0  . Multiple Vitamin (MULTIVITAMIN) capsule Take 1 capsule by mouth daily.    Marland Kitchen oxyCODONE (OXY IR/ROXICODONE) 5 MG immediate release tablet Take 1 tablet (5 mg total) by mouth every 6 (six) hours as needed for severe pain. 15 tablet 0  . pantoprazole (PROTONIX) 40 MG tablet Take 1 tablet (40 mg total) by mouth daily. 90 tablet 3  . potassium chloride SA (KLOR-CON) 20 MEQ tablet Take 1 tablet by mouth twice daily 60 tablet 5  . rivaroxaban (XARELTO) 20 MG TABS tablet Take 1 tablet (20 mg total) by mouth daily with supper. 30 tablet 5  . saccharomyces boulardii (FLORASTOR) 250 MG capsule Take 1 capsule (250 mg total) by mouth 2 (two) times daily. Recommend starting a probiotic.    Marland Kitchen senna-docusate (SENOKOT-S) 8.6-50 MG tablet Take 2 tablets by mouth daily as needed for mild constipation or moderate constipation. 100 tablet 6  . spironolactone (ALDACTONE) 25 MG tablet Take 1 tablet (25 mg total) by mouth daily. 90 tablet 3  . SYMBICORT 160-4.5 MCG/ACT inhaler Inhale 2 puffs by mouth twice daily (Patient taking differently: Inhale 2 puffs into the lungs daily as needed (sob).) 11 g 0  . triamterene-hydrochlorothiazide (MAXZIDE-25) 37.5-25 MG tablet Take 1 tablet by mouth daily.    . ursodiol (ACTIGALL) 250 MG tablet Take 250 mg by mouth 2 (two) times daily. Take 1 tablet (250 mg total) by mouth in the morning and at bedtime. Begin taking three weeks after your surgery.     No current facility-administered medications for this visit.    Allergies as of 04/12/2020  . (No Known Allergies)    Family History  Problem Relation Age of Onset  . Deep vein thrombosis Father   . Diabetes Father   . Hypertension Mother   . Hypertension Other   . Diabetes Paternal Aunt   . Heart disease Maternal Grandmother   . Heart disease Maternal Grandfather   . Diabetes  Paternal Grandmother     Social History   Socioeconomic History  . Marital status: Married    Spouse name: Not on file  . Number of children: 1  . Years of education: Not on file  . Highest education level: Not on file  Occupational History    Employer: [employer]  Tobacco Use  . Smoking status: Never Smoker  . Smokeless tobacco: Never Used  Vaping Use  . Vaping Use: Never used  Substance and Sexual Activity  . Alcohol use: Not Currently  . Drug use: No  . Sexual activity: Yes    Birth control/protection: None  Other Topics Concern  . Not on file  Social History Narrative  . Not on file   Social Determinants of Health   Financial Resource Strain: Not on file  Food Insecurity: Not on  file  Transportation Needs: Not on file  Physical Activity: Not on file  Stress: Not on file  Social Connections: Not on file  Intimate Partner Violence: Not on file     Physical Exam: Ht 5\' 4"  (1.626 m)   Wt (!) 306 lb 6.4 oz (139 kg)   BMI 52.59 kg/m  Constitutional: generally well-appearing Psychiatric: alert and oriented x3 Eyes: extraocular movements intact Mouth: oral pharynx moist, no lesions Neck: supple no lymphadenopathy Cardiovascular: heart regular rate and rhythm Lungs: clear to auscultation bilaterally Abdomen: soft, nontender, nondistended, no obvious ascites, no peritoneal signs, normal bowel sounds Extremities: no lower extremity edema bilaterally Skin: no lesions on visible extremities   Assessment and plan: 45 y.o. female with morbid obesity, history of diverticulitis, on blood thinner Xarelto for possible pulmonary embolus  She and I discussed the fact that acute diverticulitis by CT is 99% of the time and indeed just that, and inflammatory or infectious process.  Sometimes colon cancer can mimic acute diverticulitis and because of that it is recommended that she undergo a colonoscopy at her soonest convenience.  She is on a blood thinner Xarelto we  will contact her primary care physician about the safety of her holding it for 2 days prior to a colonoscopy.  She is losing weight since her bariatric surgery last month but is still morbidly obese with a BMI around 52 and so is going to be safest that we schedule this colonoscopy to be done in the hospital setting rather than our ambulatory outpatient center.  I see no reason for any further blood tests or imaging studies prior to then.  Please see the "Patient Instructions" section for addition details about the plan.   Owens Loffler, MD [facility] 04/12/2020, 11:31 AM  Cc: Cassandria Anger, MD  Total time on date of encounter was 45 minutes (this included time spent preparing to see the patient reviewing records; obtaining and/or reviewing separately obtained history; performing a medically appropriate exam and/or evaluation; counseling and educating the patient and family if present; ordering medications, tests or procedures if applicable; and documenting clinical information in the health record).

## 2020-04-12 NOTE — Progress Notes (Signed)
HPI: This is a very pleasant 45 year old woman who was referred to me by [facility] surgery  She had lower abdominal pain, elevated white count, CT scan showing sigmoid diverticulitis with some foci of free air throughout her abdomen about 3 months ago.  She spent 3 days in the hospital and eventually was discharged in good condition.  She tells me that her abdominal pains have never happened before that episode and have not happened since the episode either.  She does have mild chronic constipation for which over-the-counter agents worked very well.  She has battled her weight for much of her life, morbidly obese.  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  Colon cancer does not run in her family but she believes her mother had a colon polyp  She does not have overt GI bleeding.  She has been on Xarelto for 2 years.  This was started for suspicion of pulmonary embolus.  She has never been proven to have a pulmonary embolus and there is considerable doubt that she did.  Her symptoms might be explained by Covid infection stent.  She is planning to discuss coming off the Xarelto with her primary care physician who manages the medicine. \  Old Data Reviewed:  CT scan abdomen pelvis with IV and oral contrast October 2021 impression reads "perforated sigmoid diverticulitis.  No organized fluid collection, no large volume ascites."  She was briefly admitted to the hospital for IV Zosyn and bowel rest.  Her pain and leukocytosis improved and she was eventually discharged to complete 11 more days of antibiotics for complete 14-day course of antibiotics.  She was recommended to have eventual colonoscopy  Blood work January 2022 through the [facility] system showed a normal CBC and normal complete metabolic profile  Blood work December 2021 normal CBC, normal basic metabolic profile  Urine pregnancy test  December 2021 was negative  She underwent a bariatric sleeve gastrectomy for morbid obesity BMI 54 1 month ago at [facility]    Review of systems: Pertinent positive and negative review of systems were noted in the above HPI section. All other review negative.   Past Medical History:  Diagnosis Date  . Constipation   . Edema   . Headache(784.0)   . Morbid obesity ([place])   . Pulmonary embolism [facility])     Past Surgical History:  Procedure Laterality Date  . BREAST REDUCTION SURGERY    . BREAST SURGERY  2005   reduction  . CESAREAN SECTION N/A 03/20/2016   Procedure: CESAREAN SECTION;  Surgeon: Christophe Louis, MD;  Location: [place];  Service: Obstetrics;  Laterality: N/A;  . TUBAL LIGATION Bilateral 03/20/2016   Procedure: BILATERAL TUBAL LIGATION;  Surgeon: Christophe Louis, MD;  Location: [place];  Service: Obstetrics;  Laterality: Bilateral;    Current Outpatient Medications  Medication Sig Dispense Refill  . acetaminophen (TYLENOL) 500 MG tablet Take 1,000 mg by mouth every 6 (six) hours as needed for mild pain.    Marland Kitchen albuterol (VENTOLIN HFA) 108 (90 Base) MCG/ACT inhaler Inhale 1-2 puffs into the lungs every 6 (six) hours as needed for wheezing or shortness of breath. 18 g 1  . alum & mag hydroxide-simeth (MAALOX ADVANCED MAX ST) [phone] MG/5ML suspension Take 5 mLs by mouth every 6 (six) hours as needed for indigestion. 355 mL 0  . cetirizine (ZYRTEC) 10 MG tablet Take 10 mg by mouth  daily as needed for allergies.    . Cholecalciferol (VITAMIN D3) 1.25 MG (50000 UT) CAPS Take 1 capsule by mouth once a week. 12 capsule 3  . clonazePAM (KLONOPIN) 0.5 MG disintegrating tablet Take 1-2 tablets (0.5-1 mg total) by mouth 2 (two) times daily as needed (panic attacks). 60 tablet 1  . Cyanocobalamin (VITAMIN B-12) 1000 MCG SUBL Place 1 tablet (1,000 mcg total) under the tongue daily. 100 tablet 3  . diclofenac Sodium (VOLTAREN) 1 % GEL Apply 2 g topically 4 (four) times  daily. 100 g 0  . meclizine (ANTIVERT) 25 MG tablet Take 1 tablet (25 mg total) by mouth 3 (three) times daily as needed for dizziness. 30 tablet 0  . Multiple Vitamin (MULTIVITAMIN) capsule Take 1 capsule by mouth daily.    Marland Kitchen oxyCODONE (OXY IR/ROXICODONE) 5 MG immediate release tablet Take 1 tablet (5 mg total) by mouth every 6 (six) hours as needed for severe pain. 15 tablet 0  . pantoprazole (PROTONIX) 40 MG tablet Take 1 tablet (40 mg total) by mouth daily. 90 tablet 3  . potassium chloride SA (KLOR-CON) 20 MEQ tablet Take 1 tablet by mouth twice daily 60 tablet 5  . rivaroxaban (XARELTO) 20 MG TABS tablet Take 1 tablet (20 mg total) by mouth daily with supper. 30 tablet 5  . saccharomyces boulardii (FLORASTOR) 250 MG capsule Take 1 capsule (250 mg total) by mouth 2 (two) times daily. Recommend starting a probiotic.    Marland Kitchen senna-docusate (SENOKOT-S) 8.6-50 MG tablet Take 2 tablets by mouth daily as needed for mild constipation or moderate constipation. 100 tablet 6  . spironolactone (ALDACTONE) 25 MG tablet Take 1 tablet (25 mg total) by mouth daily. 90 tablet 3  . SYMBICORT 160-4.5 MCG/ACT inhaler Inhale 2 puffs by mouth twice daily (Patient taking differently: Inhale 2 puffs into the lungs daily as needed (sob).) 11 g 0  . triamterene-hydrochlorothiazide (MAXZIDE-25) 37.5-25 MG tablet Take 1 tablet by mouth daily.    . ursodiol (ACTIGALL) 250 MG tablet Take 250 mg by mouth 2 (two) times daily. Take 1 tablet (250 mg total) by mouth in the morning and at bedtime. Begin taking three weeks after your surgery.     No current facility-administered medications for this visit.    Allergies as of 04/12/2020  . (No Known Allergies)    Family History  Problem Relation Age of Onset  . Deep vein thrombosis Father   . Diabetes Father   . Hypertension Mother   . Hypertension Other   . Diabetes Paternal Aunt   . Heart disease Maternal Grandmother   . Heart disease Maternal Grandfather   . Diabetes  Paternal Grandmother     Social History   Socioeconomic History  . Marital status: Married    Spouse name: Not on file  . Number of children: 1  . Years of education: Not on file  . Highest education level: Not on file  Occupational History    Employer: [employer]  Tobacco Use  . Smoking status: Never Smoker  . Smokeless tobacco: Never Used  Vaping Use  . Vaping Use: Never used  Substance and Sexual Activity  . Alcohol use: Not Currently  . Drug use: No  . Sexual activity: Yes    Birth control/protection: None  Other Topics Concern  . Not on file  Social History Narrative  . Not on file   Social Determinants of Health   Financial Resource Strain: Not on file  Food Insecurity: Not on  file  Transportation Needs: Not on file  Physical Activity: Not on file  Stress: Not on file  Social Connections: Not on file  Intimate Partner Violence: Not on file     Physical Exam: Ht 5\' 4"  (1.626 m)   Wt (!) 306 lb 6.4 oz (139 kg)   BMI 52.59 kg/m  Constitutional: generally well-appearing Psychiatric: alert and oriented x3 Eyes: extraocular movements intact Mouth: oral pharynx moist, no lesions Neck: supple no lymphadenopathy Cardiovascular: heart regular rate and rhythm Lungs: clear to auscultation bilaterally Abdomen: soft, nontender, nondistended, no obvious ascites, no peritoneal signs, normal bowel sounds Extremities: no lower extremity edema bilaterally Skin: no lesions on visible extremities   Assessment and plan: 45 y.o. female with morbid obesity, history of diverticulitis, on blood thinner Xarelto for possible pulmonary embolus  She and I discussed the fact that acute diverticulitis by CT is 99% of the time and indeed just that, and inflammatory or infectious process.  Sometimes colon cancer can mimic acute diverticulitis and because of that it is recommended that she undergo a colonoscopy at her soonest convenience.  She is on a blood thinner Xarelto we  will contact her primary care physician about the safety of her holding it for 2 days prior to a colonoscopy.  She is losing weight since her bariatric surgery last month but is still morbidly obese with a BMI around 52 and so is going to be safest that we schedule this colonoscopy to be done in the hospital setting rather than our ambulatory outpatient center.  I see no reason for any further blood tests or imaging studies prior to then.  Please see the "Patient Instructions" section for addition details about the plan.   Owens Loffler, MD [facility] 04/12/2020, 11:31 AM  Cc: Cassandria Anger, MD  Total time on date of encounter was 45 minutes (this included time spent preparing to see the patient reviewing records; obtaining and/or reviewing separately obtained history; performing a medically appropriate exam and/or evaluation; counseling and educating the patient and family if present; ordering medications, tests or procedures if applicable; and documenting clinical information in the health record).

## 2020-04-12 NOTE — Patient Instructions (Addendum)
If you are age 43 or younger, your body mass index should be between 19-25. Your Body mass index is 52.59 kg/m. If this is out of the aformentioned range listed, please consider follow up with your Primary Care Provider.   You have been scheduled for a colonoscopy. Please follow written instructions given to you at your visit today.  Please pick up your prep supplies at the pharmacy within the next 1-3 days. If you use inhalers (even only as needed), please bring them with you on the day of your procedure.  Due to recent changes in healthcare laws, you may see the results of your imaging and laboratory studies on MyChart before your provider has had a chance to review them.  We understand that in some cases there may be results that are confusing or concerning to you. Not all laboratory results come back in the same time frame and the provider may be waiting for multiple results in order to interpret others.  Please give [place] 48 hours in order for your provider to thoroughly review all the results before contacting the office for clarification of your results.   Thank you for entrusting me with your care and choosing [facility].  Dr Ardis Hughs

## 2020-04-13 NOTE — Telephone Encounter (Signed)
Bridget [name], Yes, she may. Thx

## 2020-04-13 NOTE — Telephone Encounter (Signed)
Left message for patient to return call for further instructions on Xarelto hold prior to procedure.  Will continue efforts.

## 2020-04-14 NOTE — Telephone Encounter (Signed)
Patient returned the call was advised to hold Xarelto for two days prior to procedure per previous notes. Patient understood will hold starting 05/08/20.

## 2020-04-14 NOTE — Telephone Encounter (Signed)
Phone call to patient to confirm that she should take Xarelto on 05-08-20 and to start holding on 05-09-2020.  Patient agreed to plan and verbalized understanding.  No further questions.

## 2020-05-03 ENCOUNTER — Other Ambulatory Visit: Payer: Self-pay

## 2020-05-03 ENCOUNTER — Encounter (HOSPITAL_COMMUNITY): Payer: Self-pay | Admitting: Gastroenterology

## 2020-05-04 ENCOUNTER — Other Ambulatory Visit: Payer: Self-pay | Admitting: Internal Medicine

## 2020-05-08 ENCOUNTER — Other Ambulatory Visit (HOSPITAL_COMMUNITY)
Admission: RE | Admit: 2020-05-08 | Discharge: 2020-05-08 | Disposition: A | Discharge status: Home / Self Care | Payer: 59 | Source: Ambulatory Visit | Attending: Gastroenterology | Admitting: Gastroenterology

## 2020-05-08 DIAGNOSIS — Z01812 Encounter for preprocedural laboratory examination: Secondary | ICD-10-CM | POA: Insufficient documentation

## 2020-05-08 DIAGNOSIS — Z20822 Contact with and (suspected) exposure to covid-19: Secondary | ICD-10-CM | POA: Diagnosis not present

## 2020-05-09 ENCOUNTER — Telehealth: Payer: Self-pay | Admitting: Gastroenterology

## 2020-05-09 LAB — SARS CORONAVIRUS 2 (TAT 6-24 HRS): SARS Coronavirus 2: NEGATIVE

## 2020-05-09 NOTE — Telephone Encounter (Signed)
Pt is scheduled for colon on 2/24, she wants to know if there is an alternative preparation for bariatric pts. She stated that her bariatric doctor told her that there should be one. Pls call pt.

## 2020-05-09 NOTE — Telephone Encounter (Signed)
The pt has been advised that no other prep is available. I did suggest she start a little sooner and drink smaller quantity at a time.  She states she can drink 8 ounces at a time.  She will call back if she has any problems.

## 2020-05-11 ENCOUNTER — Ambulatory Visit (HOSPITAL_COMMUNITY): Payer: 59 | Admitting: Anesthesiology

## 2020-05-11 ENCOUNTER — Other Ambulatory Visit: Payer: Self-pay

## 2020-05-11 ENCOUNTER — Ambulatory Visit (HOSPITAL_COMMUNITY)
Admission: RE | Admit: 2020-05-11 | Discharge: 2020-05-11 | Disposition: A | Discharge status: Home / Self Care | Payer: 59 | Attending: Gastroenterology | Admitting: Gastroenterology

## 2020-05-11 ENCOUNTER — Encounter (HOSPITAL_COMMUNITY): Payer: Self-pay | Admitting: Gastroenterology

## 2020-05-11 ENCOUNTER — Encounter (HOSPITAL_COMMUNITY)
Admission: RE | Disposition: A | Discharge status: Home / Self Care | Payer: Self-pay | Source: Home / Self Care | Attending: Gastroenterology

## 2020-05-11 DIAGNOSIS — K635 Polyp of colon: Secondary | ICD-10-CM | POA: Insufficient documentation

## 2020-05-11 DIAGNOSIS — R933 Abnormal findings on diagnostic imaging of other parts of digestive tract: Secondary | ICD-10-CM | POA: Diagnosis present

## 2020-05-11 DIAGNOSIS — K5909 Other constipation: Secondary | ICD-10-CM | POA: Diagnosis not present

## 2020-05-11 DIAGNOSIS — Z6841 Body Mass Index (BMI) 40.0 and over, adult: Secondary | ICD-10-CM | POA: Diagnosis not present

## 2020-05-11 DIAGNOSIS — Z7951 Long term (current) use of inhaled steroids: Secondary | ICD-10-CM | POA: Diagnosis not present

## 2020-05-11 DIAGNOSIS — Z79899 Other long term (current) drug therapy: Secondary | ICD-10-CM | POA: Insufficient documentation

## 2020-05-11 DIAGNOSIS — K5790 Diverticulosis of intestine, part unspecified, without perforation or abscess without bleeding: Secondary | ICD-10-CM | POA: Insufficient documentation

## 2020-05-11 DIAGNOSIS — Z9884 Bariatric surgery status: Secondary | ICD-10-CM | POA: Diagnosis not present

## 2020-05-11 DIAGNOSIS — K5732 Diverticulitis of large intestine without perforation or abscess without bleeding: Secondary | ICD-10-CM

## 2020-05-11 HISTORY — PX: POLYPECTOMY: SHX5525

## 2020-05-11 HISTORY — PX: COLONOSCOPY WITH PROPOFOL: SHX5780

## 2020-05-11 SURGERY — COLONOSCOPY WITH PROPOFOL
Anesthesia: Monitor Anesthesia Care

## 2020-05-11 MED ORDER — PROPOFOL 10 MG/ML IV BOLUS
INTRAVENOUS | Status: DC | PRN
  Administered 2020-05-11 (×3): 20 mg via INTRAVENOUS

## 2020-05-11 MED ORDER — SODIUM CHLORIDE 0.9 % IV SOLN: INTRAVENOUS | Status: DC

## 2020-05-11 MED ORDER — PROPOFOL 500 MG/50ML IV EMUL
INTRAVENOUS | Status: AC
  Filled 2020-05-11: qty 50

## 2020-05-11 MED ORDER — LIDOCAINE 2% (20 MG/ML) 5 ML SYRINGE
INTRAMUSCULAR | Status: DC | PRN
  Administered 2020-05-11: 100 mg via INTRAVENOUS

## 2020-05-11 MED ORDER — LACTATED RINGERS IV SOLN: INTRAVENOUS | Status: DC | PRN

## 2020-05-11 MED ORDER — PROPOFOL 500 MG/50ML IV EMUL
INTRAVENOUS | Status: DC | PRN
  Administered 2020-05-11: 125 ug/kg/min via INTRAVENOUS

## 2020-05-11 SURGICAL SUPPLY — 22 items

## 2020-05-11 NOTE — Op Note (Signed)
[facility] Patient Name: Bridget [name] Procedure Date: 05/11/2020 MRN: [id number] Attending MD: [name] , MD Date of Birth: 1975-06-19 CSN: [id number] Age: 45 Admit Type: Outpatient Procedure:                Colonoscopy Indications:              Abnormal CT of the colon, likely diverticulitis                            several months ago Providers:                [name], MD, Cleda Daub, RN, Lesia Sago, Technician, Danley Danker, CRNA Referring MD:              Medicines:                Monitored Anesthesia Care Complications:            No immediate complications. Estimated blood loss:                            None. Estimated Blood Loss:     Estimated blood loss: none. Procedure:                Pre-Anesthesia Assessment:                           - Prior to the procedure, a History and Physical                            was performed, and patient medications and                            allergies were reviewed. The patient's tolerance of                            previous anesthesia was also reviewed. The risks                            and benefits of the procedure and the sedation                            options and risks were discussed with the patient.                            All questions were answered, and informed consent                            was obtained. Prior Anticoagulants: The patient has                            taken Xarelto (rivaroxaban), last dose was 2 days  prior to procedure. ASA Grade Assessment: IV - A                            patient with severe systemic disease that is a                            constant threat to life. After reviewing the risks                            and benefits, the patient was deemed in                            satisfactory condition to undergo the procedure.                           After obtaining informed  consent, the colonoscope                            was passed under direct vision. Throughout the                            procedure, the patient's blood pressure, pulse, and                            oxygen saturations were monitored continuously. The                            [id number]-HQ190L ([id number]) Olympus colonoscope was                            introduced through the anus and advanced to the the                            cecum, identified by appendiceal orifice and                            ileocecal valve. The colonoscopy was performed                            without difficulty. The patient tolerated the                            procedure well. The quality of the bowel                            preparation was good. The ileocecal valve,                            appendiceal orifice, and rectum were photographed. Scope In: 11:09:49 AM Scope Out: 11:21:55 AM Scope Withdrawal Time: 0 hours 8 minutes 29 seconds  Total Procedure Duration: 0 hours 12 minutes 6 seconds  Findings:      Changes of diverticulosis throughout the colon. This was most impressive       in the left  colon where there was associated mucosal edema, erthyema and       luminal narrowing.      A 3 mm polyp was found in the sigmoid colon. The polyp was sessile. The       polyp was removed with a cold snare. Resection and retrieval were       complete.      The exam was otherwise without abnormality on direct and retroflexion       views. Impression:               - One 3 mm polyp in the sigmoid colon, removed with                            a cold snare. Resected and retrieved.                           - Changes of diverticulosis throughout the colon.                            This was most impressive in the left colon where                            there was associated mucosal edema, erthyema and                            luminal narrowing.                           - The examination was otherwise  normal on direct                            and retroflexion views. Moderate Sedation:      Not Applicable - Patient had care per Anesthesia. Recommendation:           - Patient has a contact number available for                            emergencies. The signs and symptoms of potential                            delayed complications were discussed with the                            patient. Return to normal activities tomorrow.                            Written discharge instructions were provided to the                            patient.                           - Resume previous diet.                           - Continue present medications. OIt is Ok to resume  your xarelto today.                           - Await pathology results. Procedure Code(s):        --- Professional ---                           [phone], Colonoscopy, flexible; with removal of                            tumor(s), polyp(s), or other lesion(s) by snare                            technique Diagnosis Code(s):        --- Professional ---                           K63.5, Polyp of colon                           R93.3, Abnormal findings on diagnostic imaging of                            other parts of digestive tract CPT copyright 2019 American Medical Association. All rights reserved. The codes documented in this report are preliminary and upon coder review may  be revised to meet current compliance requirements. [name], MD 05/11/2020 11:31:51 AM This report has been signed electronically. Number of Addenda: 0

## 2020-05-11 NOTE — Anesthesia Preprocedure Evaluation (Addendum)
Anesthesia Evaluation  Patient identified by MRN, date of birth, ID band Patient awake    Reviewed: Allergy & Precautions, NPO status , Patient's Chart, lab work & pertinent test results  History of Anesthesia Complications (+) PONV and history of anesthetic complications (N/V after gastric bypass)  Airway Mallampati: I  TM Distance: >3 FB Neck ROM: Full    Dental  (+) Teeth Intact   Pulmonary PE (on xarelto)   Pulmonary exam normal breath sounds clear to auscultation       Cardiovascular hypertension, Pt. on medications + DOE  Normal cardiovascular exam Rhythm:Regular Rate:Normal  Echo 06/2018: 1. The left ventricle has normal systolic function with an ejection  fraction of 60-65%. The cavity size was normal. There is mild concentric  left ventricular hypertrophy. Left ventricular diastolic parameters were  normal. No evidence of left ventricular  regional wall motion abnormalities.  2. The right ventricle has normal systolic function. The cavity was  normal. There is no increase in right ventricular wall thickness.  3. No evidence of mitral valve stenosis.  4. The aortic valve is tricuspid.  5. The aortic root and aortic arch are normal in size and structure.    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   Reproductive/Obstetrics S/p tubal ligation                           Anesthesia Physical Anesthesia Plan  ASA: III  Anesthesia Plan: MAC   Post-op Pain Management:     Induction:   PONV Risk Score and Plan: 2 and Propofol infusion and TIVA  Airway Management Planned: Natural Airway and Simple Face Mask  Additional Equipment: None  Intra-op Plan:   Post-operative Plan:   Informed Consent: I have reviewed the patients History and Physical, chart, labs and discussed the procedure including the risks, benefits and alternatives for the proposed anesthesia with the patient or authorized representative who has indicated his/her understanding and acceptance.       Plan Discussed with: CRNA  Anesthesia Plan Comments:        Anesthesia Quick Evaluation

## 2020-05-11 NOTE — Interval H&P Note (Signed)
History and Physical Interval Note:  05/11/2020 10:11 AM  Bridget [name]  has presented today for surgery, with the diagnosis of diverticulitis.  The various methods of treatment have been discussed with the patient and family. After consideration of risks, benefits and other options for treatment, the patient has consented to  Procedure(s): COLONOSCOPY WITH PROPOFOL (N/A) as a surgical intervention.  The patient's history has been reviewed, patient examined, no change in status, stable for surgery.  I have reviewed the patient's chart and labs.  Questions were answered to the patient's satisfaction.     [name]

## 2020-05-11 NOTE — Discharge Instructions (Signed)
YOU HAD AN ENDOSCOPIC PROCEDURE TODAY: Refer to the procedure report and other information in the discharge instructions given to you for any specific questions about what was found during the examination. If this information does not answer your questions, please call [place] office at [phone] to clarify.  ° °YOU SHOULD EXPECT: Some feelings of bloating in the abdomen. Passage of more gas than usual. Walking can help get rid of the air that was put into your GI tract during the procedure and reduce the bloating. If you had a lower endoscopy (such as a colonoscopy or flexible sigmoidoscopy) you may notice spotting of blood in your stool or on the toilet paper. Some abdominal soreness may be present for a day or two, also. ° °DIET: Your first meal following the procedure should be a light meal and then it is ok to progress to your normal diet. A half-sandwich or bowl of soup is an example of a good first meal. Heavy or fried foods are harder to digest and may make you feel nauseous or bloated. Drink plenty of fluids but you should avoid alcoholic beverages for 24 hours. If you had a esophageal dilation, please see attached instructions for diet.   ° °ACTIVITY: Your care partner should take you home directly after the procedure. You should plan to take it easy, moving slowly for the rest of the day. You can resume normal activity the day after the procedure however YOU SHOULD NOT DRIVE, use power tools, machinery or perform tasks that involve climbing or major physical exertion for 24 hours (because of the sedation medicines used during the test).  ° °SYMPTOMS TO REPORT IMMEDIATELY: °A gastroenterologist can be reached at any hour. Please call [phone]  for any of the following symptoms:  °Following lower endoscopy (colonoscopy, flexible sigmoidoscopy) °Excessive amounts of blood in the stool  °Significant tenderness, worsening of abdominal pains  °Swelling of the abdomen that is new, acute  °Fever of 100° or  higher  °Following upper endoscopy (EGD, EUS, ERCP, esophageal dilation) °Vomiting of blood or coffee ground material  °New, significant abdominal pain  °New, significant chest pain or pain under the shoulder blades  °Painful or persistently difficult swallowing  °New shortness of breath  °Black, tarry-looking or red, bloody stools ° °FOLLOW UP:  °If any biopsies were taken you will be contacted by phone or by letter within the next 1-3 weeks. Call [phone]  if you have not heard about the biopsies in 3 weeks.  °Please also call with any specific questions about appointments or follow up tests. ° °

## 2020-05-11 NOTE — Anesthesia Postprocedure Evaluation (Signed)
Anesthesia Post Note  Patient: Bridget [name]  Procedure(s) Performed: COLONOSCOPY WITH PROPOFOL (N/A ) POLYPECTOMY     Patient location during evaluation: PACU Anesthesia Type: MAC Level of consciousness: awake and alert Pain management: pain level controlled Vital Signs Assessment: post-procedure vital signs reviewed and stable Respiratory status: spontaneous breathing, nonlabored ventilation and respiratory function stable Cardiovascular status: blood pressure returned to baseline and stable Postop Assessment: no apparent nausea or vomiting Anesthetic complications: no   No complications documented.  Last Vitals:  Vitals:   05/11/20 0943 05/11/20 1130  BP: (!) 160/108 (!) 117/55  Pulse:  66  Resp: 18 20  Temp: (!) 36 C 36.8 C  SpO2: 100% 100%    Last Pain:  Vitals:   05/11/20 1130  TempSrc: Oral  PainSc: 0-No pain                 Pervis Hocking

## 2020-05-11 NOTE — Anesthesia Procedure Notes (Signed)
Date/Time: 05/11/2020 11:03 AM Performed by: Sharlette Dense, CRNA Oxygen Delivery Method: Simple face mask

## 2020-05-11 NOTE — Transfer of Care (Signed)
Immediate Anesthesia Transfer of Care Note  Patient: Bridget [name]  Procedure(s) Performed: COLONOSCOPY WITH PROPOFOL (N/A ) POLYPECTOMY  Patient Location: Endoscopy Unit  Anesthesia Type:MAC  Level of Consciousness: drowsy  Airway & Oxygen Therapy: Patient Spontanous Breathing and Patient connected to face mask oxygen  Post-op Assessment: Report given to RN and Post -op Vital signs reviewed and stable  Post vital signs: Reviewed and stable  Last Vitals:  Vitals Value Taken Time  BP    Temp    Pulse 66 05/11/20 1129  Resp 20 05/11/20 1129  SpO2 100 % 05/11/20 1129  Vitals shown include unvalidated device data.  Last Pain:  Vitals:   05/11/20 0943  TempSrc: Oral  PainSc: 0-No pain         Complications: No complications documented.

## 2020-05-12 LAB — SURGICAL PATHOLOGY

## 2020-05-15 ENCOUNTER — Encounter (HOSPITAL_COMMUNITY): Payer: Self-pay | Admitting: Gastroenterology

## 2020-06-20 ENCOUNTER — Other Ambulatory Visit: Payer: Self-pay | Admitting: Family

## 2020-06-21 ENCOUNTER — Other Ambulatory Visit: Payer: Self-pay

## 2020-06-21 ENCOUNTER — Telehealth (INDEPENDENT_AMBULATORY_CARE_PROVIDER_SITE_OTHER): Payer: 59 | Admitting: Family

## 2020-06-21 DIAGNOSIS — J209 Acute bronchitis, unspecified: Secondary | ICD-10-CM | POA: Diagnosis not present

## 2020-06-21 MED ORDER — ALBUTEROL SULFATE HFA 108 (90 BASE) MCG/ACT IN AERS: 1.0000 | INHALATION_SPRAY | Freq: Four times a day (QID) | RESPIRATORY_TRACT | 0 refills | Status: AC | PRN

## 2020-06-21 MED ORDER — AZITHROMYCIN 250 MG PO TABS: ORAL_TABLET | ORAL | 0 refills | Status: DC

## 2020-06-21 NOTE — Progress Notes (Signed)
Bridget [name] is a 45 y.o. female with the following history as recorded in [employer]:  Patient Active Problem List   Diagnosis Date Noted  . Perforated diverticulum 12/31/2019  . GERD (gastroesophageal reflux disease) 09/29/2019  . Constipation 09/29/2019  . Foot pain, right 08/04/2019  . BPPV (benign paroxysmal positional vertigo) 12/04/2018  . Chest discomfort 09/16/2018  . Pulmonary embolism ([place]) 07/22/2018  . Anxiety and depression 07/13/2018  . Arm swelling 07/06/2018  . DOE (dyspnea on exertion) 06/30/2018  . HTN (hypertension) 10/29/2017  . Reactive depression (situational) 10/29/2017  . Pruritic condition 09/25/2016  . Urticaria 09/25/2016  . Anemia 04/23/2016  . Well adult exam 05/06/2014  . Otitis media 12/13/2011  . Fatigue 12/13/2011  . B12 deficiency 12/13/2011  . Vitamin D deficiency 12/13/2011  . Palpitations 08/06/2010  . Syncope and collapse 08/06/2010  . EDEMA 10/14/2007  . UTI 07/21/2007  . LOW BACK PAIN 07/21/2007  . ELEVATED BP 07/21/2007  . Morbid (severe) obesity due to excess calories ([place]) 11/15/2006  . Migraines 11/15/2006    Current Outpatient Medications  Medication Sig Dispense Refill  . albuterol (VENTOLIN HFA) 108 (90 Base) MCG/ACT inhaler Inhale 1-2 puffs into the lungs every 6 (six) hours as needed for wheezing or shortness of breath. Follow-up appt is due must see provider for future refills 18 g 0  . azithromycin (ZITHROMAX) 250 MG tablet 2 tabs po qd x 1 day; 1 tablet per day x 4 days; 6 tablet 0  . acetaminophen (TYLENOL) 500 MG tablet Take 1,000 mg by mouth every 6 (six) hours as needed for mild pain.    Marland Kitchen albuterol (VENTOLIN HFA) 108 (90 Base) MCG/ACT inhaler Inhale 1-2 puffs into the lungs every 6 (six) hours as needed for wheezing or shortness of breath. 18 g 0  . alum & mag hydroxide-simeth (MAALOX ADVANCED MAX ST) 161-096-04 MG/5ML suspension Take 5 mLs by mouth every 6 (six) hours as needed for indigestion. (Patient not taking:  Reported on 05/02/2020) 355 mL 0  . cetirizine (ZYRTEC) 10 MG tablet Take 10 mg by mouth daily as needed for allergies.    . Cholecalciferol (VITAMIN D3) 1.25 MG (50000 UT) CAPS Take 1 capsule by mouth once a week. (Patient not taking: Reported on 05/02/2020) 12 capsule 3  . cholecalciferol (VITAMIN D3) 25 MCG (1000 UNIT) tablet Take 1,000 Units by mouth daily.    . clonazePAM (KLONOPIN) 0.5 MG disintegrating tablet Take 1-2 tablets (0.5-1 mg total) by mouth 2 (two) times daily as needed (panic attacks). (Patient not taking: Reported on 05/02/2020) 60 tablet 1  . Cyanocobalamin (VITAMIN B-12) 1000 MCG SUBL Place 1 tablet (1,000 mcg total) under the tongue daily. 100 tablet 3  . diclofenac Sodium (VOLTAREN) 1 % GEL Apply 2 g topically 4 (four) times daily. (Patient not taking: Reported on 05/02/2020) 100 g 0  . meclizine (ANTIVERT) 25 MG tablet Take 1 tablet (25 mg total) by mouth 3 (three) times daily as needed for dizziness. 30 tablet 0  . Multiple Vitamin (MULTIVITAMIN) capsule Take 1 capsule by mouth daily.    . Na Sulfate-K Sulfate-Mg Sulf (SUPREP BOWEL PREP KIT) 17.5-3.13-1.6 GM/177ML SOLN Take 1 kit by mouth as directed. 324 mL 0  . pantoprazole (PROTONIX) 40 MG tablet Take 1 tablet (40 mg total) by mouth daily. 90 tablet 3  . potassium chloride SA (KLOR-CON) 20 MEQ tablet Take 1 tablet by mouth twice daily (Patient taking differently: Take 20 mEq by mouth 2 (two) times daily.) 60 tablet  5  . rivaroxaban (XARELTO) 20 MG TABS tablet Take 1 tablet (20 mg total) by mouth daily with supper. 30 tablet 5  . saccharomyces boulardii (FLORASTOR) 250 MG capsule Take 1 capsule (250 mg total) by mouth 2 (two) times daily. Recommend starting a probiotic. (Patient not taking: Reported on 05/02/2020)    . triamterene-hydrochlorothiazide (MAXZIDE-25) 37.5-25 MG tablet Take 1 tablet by mouth daily. 90 tablet 2  . ursodiol (ACTIGALL) 250 MG tablet Take 250 mg by mouth 2 (two) times daily. Take 1 tablet (250 mg total)  by mouth in the morning and at bedtime. Begin taking three weeks after your surgery. (Patient not taking: Reported on 05/02/2020)     No current facility-administered medications for this visit.    Allergies: Patient has no known allergies.  Past Medical History:  Diagnosis Date  . Constipation   . Edema   . Headache(784.0)   . Morbid obesity ([place])   . Pulmonary embolism [facility])     Past Surgical History:  Procedure Laterality Date  . BREAST REDUCTION SURGERY    . BREAST SURGERY  2005   reduction  . CESAREAN SECTION N/A 03/20/2016   Procedure: CESAREAN SECTION;  Surgeon: Christophe Louis, MD;  Location: [place];  Service: Obstetrics;  Laterality: N/A;  . COLONOSCOPY WITH PROPOFOL N/A 05/11/2020   Procedure: COLONOSCOPY WITH PROPOFOL;  Surgeon: [name], MD;  Location: [facility];  Service: Endoscopy;  Laterality: N/A;  . POLYPECTOMY  05/11/2020   Procedure: POLYPECTOMY;  Surgeon: [name], MD;  Location: [facility];  Service: Endoscopy;;  . TUBAL LIGATION Bilateral 03/20/2016   Procedure: BILATERAL TUBAL LIGATION;  Surgeon: Christophe Louis, MD;  Location: [place];  Service: Obstetrics;  Laterality: Bilateral;    Family History  Problem Relation Age of Onset  . Deep vein thrombosis Father   . Diabetes Father   . Hypertension Mother   . Hypertension Other   . Diabetes Paternal Aunt   . Heart disease Maternal Grandmother   . Heart disease Maternal Grandfather   . Diabetes Paternal Grandmother     Social History   Tobacco Use  . Smoking status: Never Smoker  . Smokeless tobacco: Never Used  Substance Use Topics  . Alcohol use: Not Currently    Subjective:   I connected with Bridget [name] on 06/21/20 at  9:40 AM EDT by a video enabled telemedicine application and verified that I am speaking with the correct person using two identifiers.   I discussed the limitations of evaluation and management by telemedicine and the availability of in person  appointments. The patient expressed understanding and agreed to proceed. Provider in office/ patient is at home; provider and patient are only 2 people on video call.   Cold symptoms x 1 week; is prone to bronchitis; feels like symptoms are moving into her chest; requesting refill on her albuterol- no shortness of breath but chest "feels tight." No concerns for COVID exposure; no relief with OTC medication;    Objective:  There were no vitals filed for this visit.  General: Well developed, well nourished, in no acute distress  Head: Normocephalic and atraumatic  Lungs: Respirations unlabored;  Neurologic: Alert and oriented; speech intact; face symmetrical;   Assessment:  1. Acute bronchitis, unspecified organism     Plan:  Rx for Z-pak and albuterol inhaler; increase fluids, rest; to consider COVID testing if symptoms persist; follow-up worse, no better.   No follow-ups on file.  No orders of the  defined types were placed in this encounter.   Requested Prescriptions   Signed Prescriptions Disp Refills  . azithromycin (ZITHROMAX) 250 MG tablet 6 tablet 0    Sig: 2 tabs po qd x 1 day; 1 tablet per day x 4 days;  . albuterol (VENTOLIN HFA) 108 (90 Base) MCG/ACT inhaler 18 g 0    Sig: Inhale 1-2 puffs into the lungs every 6 (six) hours as needed for wheezing or shortness of breath.

## 2020-06-22 ENCOUNTER — Other Ambulatory Visit: Payer: 59

## 2020-06-27 ENCOUNTER — Encounter: Payer: Self-pay | Admitting: *Deleted

## 2020-06-27 ENCOUNTER — Telehealth: Payer: Self-pay | Admitting: Cardiovascular Disease

## 2020-06-27 ENCOUNTER — Ambulatory Visit (INDEPENDENT_AMBULATORY_CARE_PROVIDER_SITE_OTHER): Payer: 59

## 2020-06-27 DIAGNOSIS — R002 Palpitations: Secondary | ICD-10-CM

## 2020-06-27 NOTE — Progress Notes (Unsigned)
Patient ID: Bridget [name], female   DOB: 31-Dec-1975, 45 y.o.   MRN: [id number] Patient enrolled for [employer] expedite shipment of a 14 day ZIO XT monitor to the patients home.  Investigated why patient had not received cardiac event monitor back in September 2021.  Cardiac event monitor order was linked to 11/18/2019 new patient office visit.  This happens if the monitor status for the order is marked normal instead of future.  When this happens the order links to that days appointment . The monitor department is not aware of the order because it does not drop into the monitor work que.

## 2020-06-27 NOTE — Telephone Encounter (Signed)
Patient c/o Palpitations:  High priority if patient c/o lightheadedness, shortness of breath, or chest pain  1) How long have you had palpitations/irregular HR/ Afib? Are you having the symptoms now? Yes   2) Are you currently experiencing lightheadedness, SOB or CP? No   3) Do you have a history of afib (atrial fibrillation) or irregular heart rhythm? No   4) Have you checked your BP or HR? (document readings if available): 99  5) Are you experiencing any other symptoms? Heart racing,

## 2020-06-27 NOTE — Telephone Encounter (Signed)
Returned call to Pt.  She has had heart racing 4 times a week since she saw Dr. Acie Fredrickson in Sept 2021.  At that visit a cardiac event monitor was ordered but not shipped to the Pt.  Advised Pt that we need to identify what her palpitations are before we can treat them.    Discussed with Dr. Acie Fredrickson.  Will expedite a 2 week Zio to Pt to evaluate palpitations.  Will send a message to monitor tech to expedite monitor to Pt or have Pt come to office to have placed.  Order placed.

## 2020-06-28 NOTE — Telephone Encounter (Signed)
See 06/27/20 monitor documentation.

## 2020-06-29 DIAGNOSIS — R002 Palpitations: Secondary | ICD-10-CM | POA: Diagnosis not present

## 2020-07-05 NOTE — Telephone Encounter (Signed)
RN confirmed with patient that zio patch had been received. Patient states she has been wearing the monitor since last Thursday. RN encouraged patient to contact the office with any questions or concerns. Patient verbalized understanding.

## 2020-07-27 NOTE — Telephone Encounter (Signed)
RN spoke with patient regarding Dr.Nahsers recommendations. RN reviewed monitor results as well. Patient states that she is currently "feeling fine" and does not think another medication is needed at this time. Patient wanted clarification that her results were stable. Patient states she was unable to take the spironolactone due to it making her feel bad, so she would rather remain on the triamterene-HCTZ at this time. Patient plans to follow-up with her PCP regarding potassium levels. RN advised patient to contact us if needed. Patient verbalized understanding.

## 2020-08-07 ENCOUNTER — Telehealth: Payer: Self-pay | Admitting: Internal Medicine

## 2020-08-07 ENCOUNTER — Telehealth: Payer: Self-pay | Admitting: Cardiovascular Disease

## 2020-08-07 ENCOUNTER — Emergency Department (HOSPITAL_BASED_OUTPATIENT_CLINIC_OR_DEPARTMENT_OTHER): Payer: 59

## 2020-08-07 ENCOUNTER — Emergency Department (HOSPITAL_BASED_OUTPATIENT_CLINIC_OR_DEPARTMENT_OTHER)
Admission: EM | Admit: 2020-08-07 | Discharge: 2020-08-07 | Disposition: A | Discharge status: Home / Self Care | Payer: 59 | Attending: Emergency Medicine | Admitting: Emergency Medicine

## 2020-08-07 ENCOUNTER — Encounter (HOSPITAL_BASED_OUTPATIENT_CLINIC_OR_DEPARTMENT_OTHER): Payer: Self-pay | Admitting: Emergency Medicine

## 2020-08-07 ENCOUNTER — Other Ambulatory Visit: Payer: Self-pay

## 2020-08-07 DIAGNOSIS — I10 Essential (primary) hypertension: Secondary | ICD-10-CM | POA: Insufficient documentation

## 2020-08-07 DIAGNOSIS — R079 Chest pain, unspecified: Secondary | ICD-10-CM | POA: Diagnosis present

## 2020-08-07 DIAGNOSIS — Z79899 Other long term (current) drug therapy: Secondary | ICD-10-CM | POA: Insufficient documentation

## 2020-08-07 DIAGNOSIS — Z7901 Long term (current) use of anticoagulants: Secondary | ICD-10-CM | POA: Diagnosis not present

## 2020-08-07 DIAGNOSIS — Z8616 Personal history of COVID-19: Secondary | ICD-10-CM | POA: Diagnosis not present

## 2020-08-07 LAB — BASIC METABOLIC PANEL
Anion gap: 8 (ref 5–15)
BUN: 11 mg/dL (ref 6–20)
CO2: 23 mmol/L (ref 22–32)
Calcium: 8.6 mg/dL — ABNORMAL LOW (ref 8.9–10.3)
Chloride: 104 mmol/L (ref 98–111)
Creatinine, Ser: 0.69 mg/dL (ref 0.44–1.00)
GFR, Estimated: >60 mL/min (ref >60)
Glucose, Bld: 111 mg/dL — ABNORMAL HIGH (ref 70–99)
Potassium: 3.6 mmol/L (ref 3.5–5.1)
Sodium: 135 mmol/L (ref 135–145)

## 2020-08-07 LAB — CBC
HCT: 38.5 % (ref 36.0–46.0)
Hemoglobin: 13 g/dL (ref 12.0–15.0)
MCH: 32.5 pg (ref 26.0–34.0)
MCHC: 33.8 g/dL (ref 30.0–36.0)
MCV: 96.3 fL (ref 80.0–100.0)
Platelets: 302 10*3/uL (ref 150–400)
RBC: 4 MIL/uL (ref 3.87–5.11)
RDW: 12 % (ref 11.5–15.5)
WBC: 8.4 10*3/uL (ref 4.0–10.5)
nRBC: 0 % (ref 0.0–0.2)

## 2020-08-07 LAB — URINALYSIS, ROUTINE W REFLEX MICROSCOPIC
Bilirubin Urine: NEGATIVE
Glucose, UA: NEGATIVE mg/dL
Hgb urine dipstick: NEGATIVE
Ketones, ur: NEGATIVE mg/dL
Leukocytes,Ua: NEGATIVE
Nitrite: NEGATIVE
Protein, ur: NEGATIVE mg/dL
Specific Gravity, Urine: 1.025 (ref 1.005–1.030)
pH: 6 (ref 5.0–8.0)

## 2020-08-07 LAB — TROPONIN I (HIGH SENSITIVITY)
Troponin I (High Sensitivity): 2 ng/L (ref <18)
Troponin I (High Sensitivity): <2 ng/L (ref <18)

## 2020-08-07 LAB — PREGNANCY, URINE: Preg Test, Ur: NEGATIVE

## 2020-08-07 MED ORDER — SUCRALFATE 1 GM/10ML PO SUSP
1.0000 g | Freq: Once | ORAL | Status: AC
  Administered 2020-08-07: 1 g via ORAL
  Filled 2020-08-07: qty 10

## 2020-08-07 MED ORDER — HYDROXYZINE HCL 25 MG PO TABS: 25.0000 mg | ORAL_TABLET | Freq: Four times a day (QID) | ORAL | 0 refills | Status: DC

## 2020-08-07 MED ORDER — LIDOCAINE VISCOUS HCL 2 % MT SOLN
15.0000 mL | Freq: Once | OROMUCOSAL | Status: AC
  Administered 2020-08-07: 15 mL via ORAL
  Filled 2020-08-07: qty 15

## 2020-08-07 MED ORDER — ACETAMINOPHEN 500 MG PO TABS
1000.0000 mg | ORAL_TABLET | Freq: Once | ORAL | Status: AC
  Administered 2020-08-07: 1000 mg via ORAL
  Filled 2020-08-07: qty 2

## 2020-08-07 MED ORDER — ALUM & MAG HYDROXIDE-SIMETH 200-200-20 MG/5ML PO SUSP
30.0000 mL | Freq: Once | ORAL | Status: AC
  Administered 2020-08-07: 30 mL via ORAL
  Filled 2020-08-07: qty 30

## 2020-08-07 NOTE — Telephone Encounter (Signed)
[facility] FYI:  --Caller states she is wanting to speak with a nurse. Caller is having a dull ache in her chest. States it has been off and on for the last week. She has also felt fluttering in her chest, and was put on a heart monitor by cardiologist, and results came back as benign. But she has this dull ache. States she has recently changed the brand of iron supplement she takes, that is the only difference in her medications. In the past when her iron was low, it would give her some chest pain. Right now she is feeling no pain. HR = 74, O2 sat = 100%  Advised to go to ED now

## 2020-08-07 NOTE — Telephone Encounter (Signed)
Spoke with patient about chest pain she describes as coming and going with a dull ache that radiates to her left arm and neck.  Patient states she was driving when the pain started and lasted for 3-4 hours.  Patient stated she took protonix and it felt better but didn't go away.  Patient later took her maxzide and drank hot tea and it went away.   Patient reports some swelling in feet but is unable to obtain a blood pressure reading because of no cuff.  Patient was not short of breath.  Appointment with Dr. Irish Lack scheduled tomorrow.

## 2020-08-07 NOTE — ED Notes (Signed)
Pt c/o chest pain centralized with some radiation to left arm/shoulder Called cardiologist has appt tomorrow but requested her be evaluated here

## 2020-08-07 NOTE — Telephone Encounter (Signed)
  Pt c/o of Chest Pain: STAT if CP now or developed within 24 hours  1. Are you having CP right now? Not right now  2. Are you experiencing any other symptoms (ex. SOB, nausea, vomiting, sweating)? Sharp pain in next and left arm during episode  3. How long have you been experiencing CP? All of last week she felt like a "pinch" in center of her chest but yesterday it started as a dull ache and got worse, episode lasted about 3-4 hours, has had an episode earlier today  4. Is your CP continuous or coming and going? Comes and goes  5. Have you taken Nitroglycerin? Doesn't have any ?

## 2020-08-07 NOTE — ED Provider Notes (Signed)
[facility] Provider Note   CSN: [id number] Arrival date & time: 08/07/20  1925     History Chief Complaint  Patient presents with  . Chest Pain    Bridget [name] is a 45 y.o. female.  HPI  Patient is a 45 year old female with past medical history significant for constipation, edema, headache, obesity  Several years ago patient was diagnosed by outpatient provider by D-dimer test with a pulmonary embolism but never had any positive CT PE study.  She did have a PE study 2 days later that was negative.  She was told to stay on DOAC anticoagulation which she has been on since that time.  She states that today she had an episode of centralized sharp chest pain that radiated to her left arm and shoulder.  She called her cardiologist and made appointment for tomorrow became to the ER to be evaluated in the meantime. She denies any nausea or vomiting.  She states she symptoms as this kind of pain with reflux however she is very concerned for a blood clot.  She states she has had no missed doses of her anticoagulation.  She denies any significant shortness of breath.  No recent long travel.  No confirmed history of VTE in the past.  She is still taking her anticoagulant.  No recent surgeries, hospitalization, long travel, hemoptysis, estrogen containing OCP, cancer history.  No unilateral leg swelling.  No history of PE or VTE.   Patient tells me during [place] that she was very anxious about her blood getting thicker.  She states that she stayed on this anticoagulant for years now because of concerns for COVID related blood clots.  She has no other indication to be on this anticoagulant.  No history of A. fib    Past Medical History:  Diagnosis Date  . Constipation   . Edema   . Headache(784.0)   . Morbid obesity ([place])   . Pulmonary embolism [facility])     Patient Active Problem List   Diagnosis Date Noted  . Perforated diverticulum 12/31/2019  . GERD  (gastroesophageal reflux disease) 09/29/2019  . Constipation 09/29/2019  . Foot pain, right 08/04/2019  . BPPV (benign paroxysmal positional vertigo) 12/04/2018  . Chest discomfort 09/16/2018  . Pulmonary embolism ([place]) 07/22/2018  . Anxiety and depression 07/13/2018  . Arm swelling 07/06/2018  . DOE (dyspnea on exertion) 06/30/2018  . HTN (hypertension) 10/29/2017  . Reactive depression (situational) 10/29/2017  . Pruritic condition 09/25/2016  . Urticaria 09/25/2016  . Anemia 04/23/2016  . Well adult exam 05/06/2014  . Otitis media 12/13/2011  . Fatigue 12/13/2011  . B12 deficiency 12/13/2011  . Vitamin D deficiency 12/13/2011  . Palpitations 08/06/2010  . Syncope and collapse 08/06/2010  . EDEMA 10/14/2007  . UTI 07/21/2007  . LOW BACK PAIN 07/21/2007  . ELEVATED BP 07/21/2007  . Morbid (severe) obesity due to excess calories ([place]) 11/15/2006  . Migraines 11/15/2006    Past Surgical History:  Procedure Laterality Date  . BREAST REDUCTION SURGERY    . BREAST SURGERY  2005   reduction  . CESAREAN SECTION N/A 03/20/2016   Procedure: CESAREAN SECTION;  Surgeon: Christophe Louis, MD;  Location: [facility];  Service: Obstetrics;  Laterality: N/A;  . COLONOSCOPY WITH PROPOFOL N/A 05/11/2020   Procedure: COLONOSCOPY WITH PROPOFOL;  Surgeon: [name], MD;  [facility];  Service: Endoscopy;  Laterality: N/A;  . POLYPECTOMY  05/11/2020   Procedure: POLYPECTOMY;  Surgeon: Owens Loffler  P, MD;  [facility];  Service: Endoscopy;;  . TUBAL LIGATION Bilateral 03/20/2016   Procedure: BILATERAL TUBAL LIGATION;  Surgeon: Christophe Louis, MD;  Location: [facility];  Service: Obstetrics;  Laterality: Bilateral;     OB History    Gravida  7   Para  2   Term  2   Preterm      AB  5   Living  2     SAB  3   IAB  2   Ectopic      Multiple  0   Live Births  2           Family History  Problem Relation Age of Onset  . Deep vein thrombosis  Father   . Diabetes Father   . Hypertension Mother   . Hypertension Other   . Diabetes Paternal Aunt   . Heart disease Maternal Grandmother   . Heart disease Maternal Grandfather   . Diabetes Paternal Grandmother     Social History   Tobacco Use  . Smoking status: Never Smoker  . Smokeless tobacco: Never Used  Vaping Use  . Vaping Use: Never used  Substance Use Topics  . Alcohol use: Not Currently  . Drug use: No    Home Medications Prior to Admission medications   Medication Sig Start Date End Date Taking? Authorizing Provider  acetaminophen (TYLENOL) 500 MG tablet Take 1,000 mg by mouth every 6 (six) hours as needed for mild pain.   Yes [provider]  albuterol (VENTOLIN HFA) 108 (90 Base) MCG/ACT inhaler Inhale 1-2 puffs into the lungs every 6 (six) hours as needed for wheezing or shortness of breath. Follow-up appt is due must see provider for future refills 06/21/20  Yes Plotnikov, Evie Lacks, MD  albuterol (VENTOLIN HFA) 108 (90 Base) MCG/ACT inhaler Inhale 1-2 puffs into the lungs every 6 (six) hours as needed for wheezing or shortness of breath. 06/21/20  Yes Marrian Salvage, FNP  cetirizine (ZYRTEC) 10 MG tablet Take 10 mg by mouth daily as needed for allergies.   Yes [provider]  cholecalciferol (VITAMIN D3) 25 MCG (1000 UNIT) tablet Take 1,000 Units by mouth daily.   Yes [provider]  Cyanocobalamin (VITAMIN B-12) 1000 MCG SUBL Place 1 tablet (1,000 mcg total) under the tongue daily. 08/04/19  Yes Plotnikov, Evie Lacks, MD  diclofenac Sodium (VOLTAREN) 1 % GEL Apply 2 g topically 4 (four) times daily. 02/25/20  Yes Caccavale, Sophia, PA-C  hydrOXYzine (ATARAX/VISTARIL) 25 MG tablet Take 1 tablet (25 mg total) by mouth every 6 (six) hours. 08/07/20  Yes Kamara Allan, Ova [place] S, PA  meclizine (ANTIVERT) 25 MG tablet Take 1 tablet (25 mg total) by mouth 3 (three) times daily as needed for dizziness. 12/04/18  Yes Hoyt Koch, MD  Multiple  Vitamin (MULTIVITAMIN) capsule Take 1 capsule by mouth daily.   Yes [provider]  pantoprazole (PROTONIX) 40 MG tablet Take 1 tablet (40 mg total) by mouth daily. 09/29/19  Yes Plotnikov, Evie Lacks, MD  potassium chloride SA (KLOR-CON) 20 MEQ tablet Take 1 tablet by mouth twice daily Patient taking differently: Take 20 mEq by mouth 2 (two) times daily. 02/14/20  Yes Plotnikov, Evie Lacks, MD  rivaroxaban (XARELTO) 20 MG TABS tablet Take 1 tablet (20 mg total) by mouth daily with supper. 01/19/20  Yes Plotnikov, Evie Lacks, MD  triamterene-hydrochlorothiazide (MAXZIDE-25) 37.5-25 MG tablet Take 1 tablet by mouth daily. 05/04/20  Yes Plotnikov, Evie Lacks, MD  alum & mag hydroxide-simeth (MAALOX ADVANCED MAX ST) 400-400-40 MG/5ML suspension Take 5 mLs by mouth every 6 (six) hours as needed for indigestion. Patient not taking: Reported on 05/02/2020 02/25/20   Caccavale, Sophia, PA-C  azithromycin (ZITHROMAX) 250 MG tablet 2 tabs po qd x 1 day; 1 tablet per day x 4 days; 06/21/20   Marrian Salvage, FNP  Cholecalciferol (VITAMIN D3) 1.25 MG (50000 UT) CAPS Take 1 capsule by mouth once a week. 07/14/19   Plotnikov, Evie Lacks, MD  clonazePAM (KLONOPIN) 0.5 MG disintegrating tablet Take 1-2 tablets (0.5-1 mg total) by mouth 2 (two) times daily as needed (panic attacks). 09/29/19   Plotnikov, Evie Lacks, MD  Na Sulfate-K Sulfate-Mg Sulf (SUPREP BOWEL PREP KIT) 17.5-3.13-1.6 GM/177ML SOLN Take 1 kit by mouth as directed. 04/12/20   [name], MD  saccharomyces boulardii (FLORASTOR) 250 MG capsule Take 1 capsule (250 mg total) by mouth 2 (two) times daily. Recommend starting a probiotic. Patient not taking: Reported on 05/02/2020 01/04/20   Margie Billet A, PA-C  ursodiol (ACTIGALL) 250 MG tablet Take 250 mg by mouth 2 (two) times daily. Take 1 tablet (250 mg total) by mouth in the morning and at bedtime. Begin taking three weeks after your surgery. Patient not taking: Reported on 05/02/2020 03/04/20    [provider]    Allergies    Patient has no known allergies.  Review of Systems   Review of Systems  Constitutional: Negative for chills and fever.  HENT: Negative for congestion.   Eyes: Negative for pain.  Respiratory: Negative for cough and shortness of breath.   Cardiovascular: Positive for chest pain. Negative for leg swelling.  Gastrointestinal: Negative for abdominal pain, diarrhea, nausea and vomiting.  Genitourinary: Negative for dysuria.  Musculoskeletal: Negative for myalgias.  Skin: Negative for rash.  Neurological: Negative for dizziness and headaches.    Physical Exam Updated Vital Signs BP 128/84 (BP Location: Left Arm)   Pulse 65   Temp 98 F (36.7 C) (Oral)   Resp 18   SpO2 97%   Physical Exam Vitals and nursing note reviewed.  Constitutional:      General: She is not in acute distress.    Appearance: She is obese.     Comments: Pleasant well-appearing 45 year old.  In no acute distress.  Sitting comfortably in bed.  Able answer questions appropriately follow commands. No increased work of breathing. Speaking in full sentences.  HENT:     Head: Normocephalic and atraumatic.     Nose: Nose normal.     Mouth/Throat:     Mouth: Mucous membranes are moist.  Eyes:     General: No scleral icterus. Cardiovascular:     Rate and Rhythm: Normal rate and regular rhythm.     Pulses: Normal pulses.     Heart sounds: Normal heart sounds.     Comments: RRR, no MRG Pulmonary:     Effort: Pulmonary effort is normal. No respiratory distress.     Breath sounds: Normal breath sounds. No wheezing.  Abdominal:     Palpations: Abdomen is soft.     Tenderness: There is no abdominal tenderness. There is no guarding or rebound.     Comments: No epigastric tenderness palpation or right upper quadrant tenderness negative Murphy sign  Musculoskeletal:     Cervical back: Normal range of motion.     Right lower leg: No edema.     Left lower leg: No edema.   Skin:    General: Skin  is warm and dry.     Capillary Refill: Capillary refill takes less than 2 seconds.  Neurological:     Mental Status: She is alert. Mental status is at baseline.  Psychiatric:        Mood and Affect: Mood normal.        Behavior: Behavior normal.     ED Results / Procedures / Treatments   Labs (all labs ordered are listed, but only abnormal results are displayed) Labs Reviewed  BASIC METABOLIC PANEL - Abnormal; Notable for the following components:      Result Value   Glucose, Bld 111 (*)    Calcium 8.6 (*)    All other components within normal limits  CBC  URINALYSIS, ROUTINE W REFLEX MICROSCOPIC  PREGNANCY, URINE  TROPONIN I (HIGH SENSITIVITY)  TROPONIN I (HIGH SENSITIVITY)    EKG EKG Interpretation  Date/Time:  Monday Aug 07 2020 19:34:09 EDT Ventricular Rate:  82 PR Interval:  164 QRS Duration: 84 QT Interval:  365 QTC Calculation: 427 R Axis:   45 Text Interpretation: Sinus rhythm Consider right atrial enlargement Abnormal R-wave progression, early transition No significant change since last tracing Confirmed by Nanda Quinton [phone]) on 08/07/2020 7:46:58 PM   Radiology DG Chest 2 View  Result Date: 08/07/2020 CLINICAL DATA:  Chest pain. EXAM: CHEST - 2 VIEW COMPARISON:  02/25/2020 FINDINGS: The cardiomediastinal contours are normal. The lungs are clear. Pulmonary vasculature is normal. No consolidation, pleural effusion, or pneumothorax. No acute osseous abnormalities are seen. Thoracolumbar scoliosis. IMPRESSION: No acute chest findings. Electronically Signed   By: Keith Rake M.D.   On: 08/07/2020 20:20    Procedures Procedures   Medications Ordered in ED Medications  alum & mag hydroxide-simeth (MAALOX/MYLANTA) 200-200-20 MG/5ML suspension 30 mL (30 mLs Oral Given 08/07/20 2020)    And  lidocaine (XYLOCAINE) 2 % viscous mouth solution 15 mL (15 mLs Oral Given 08/07/20 2020)  acetaminophen (TYLENOL) tablet 1,000 mg (1,000 mg Oral  Given 08/07/20 2020)  sucralfate (CARAFATE) 1 GM/10ML suspension 1 g (1 g Oral Given 08/07/20 2024)    ED Course  I have reviewed the triage vital signs and the nursing notes.  Pertinent labs & imaging results that were available during my care of the patient were reviewed by me and considered in my medical decision making (see chart for details).    MDM Rules/Calculators/A&P                          Patient is a 45 year old female  Patient has no history of VTE and is therefore PERC negative.  She is satting 100% on room air has normal vital signs no tachycardia afebrile is well-appearing history atypical chest pain that is significantly improved with Carafate Tylenol and GI cocktail.  EKG is normal sinus rhythm with no significant axis deviation.  No S1Q3T3 and no significant changes from prior and no notable T wave inversions.  Chest x-ray is unremarkable.  Troponin x2 within normal limits.  Urine pregnancy test is negative and urinalysis is unremarkable.  Urinalysis was requested by patient just prior to discharge given that she states that she has not dysuria.  She denies any vaginal irritation or symptoms of a yeast infection.  No recent antibiotics.  BMP unremarkable.  CBC without leukocytosis or anemia.  Patient has follow-up appointment with her primary primary care as well as her cardiologist tomorrow.  Her chest pain is completely resolved at this time.  Will  discharge home with return precautions and clear instructions to keep her follow-up appointment tomorrow.  Patient is ambulatory time of discharge no further questions at this time.  Final Clinical Impression(s) / ED Diagnoses Final diagnoses:  Chest pain, unspecified type    Rx / DC Orders ED Discharge Orders         Ordered    hydrOXYzine (ATARAX/VISTARIL) 25 MG tablet  Every 6 hours        08/07/20 2223           Tedd Sias, [place] 08/07/20 2340    Margette Fast, MD 08/10/20 [phone]

## 2020-08-07 NOTE — ED Notes (Signed)
Patient transported to X-ray 

## 2020-08-07 NOTE — ED Triage Notes (Signed)
Pt presents to ED POV. Pt c/o L CP that radiates towards L shoulder. Pt reports that it began yesterday evening, has been intermittent but worsened this evening. Pain began at rest. Pt also reports leg swelling and lower back pain.

## 2020-08-07 NOTE — Progress Notes (Signed)
Cardiology Office Note   Date:  08/08/2020   ID:  Bridget, [name] [birth year], MRN [id number]  PCP:  Plotnikov, Evie Lacks, MD    No chief complaint on file.  Chest pain  Wt Readings from Last 3 Encounters:  08/08/20 296 lb (134.3 kg)  05/11/20 300 lb (136.1 kg)  04/12/20 (!) 306 lb 6.4 oz (139 kg)       History of Present Illness: Bridget [name] is a 45 y.o. female  Who saw Dr. Acie Fredrickson in 2021.  Records show: "with a hx of possible  pulmonary embolism,  Morbid obesity,  We were asked to see her today by Dr. Alain Marion for further evaluation of her palpitations and dyspnea   Patient was seen by her primary medical doctor on September 29, 2019.  She developed palpitations/shortness of breath/chest pressure while reading.  Her symptoms lasted for about 2 minutes.  She thought that she was dying and called EMS.   She had symptoms in 2020 c/w pulmonary embolus .  Xarelto was started at that time.  She stopped it briefly but then requested to be restarted.   Several months ago.  She had just finished her hamburger, She developed immediately severe dyspena,  Mid chest pain  With radiation to her left shoulder .  May have had some anxiety / panic attack with this .  Felt faint  Received a SKL NTG.  Her symptoms gradually eased while she was being taken to the ER .   Her primary started her on Klonipin for possible anxiety .   Echocardiogram from April, 2020 reveals normal left ventricular systolic function.  She had normal diastolic function.  There is no significant valvular disease.  The right ventricle was normal.  No regular exercise  Wt is 335 lbs  She walks 3 times a week .  She is going through  bariatric surgery program to have surgery  in the future.  She has 2 more appts.  Is able to walk without any chest pain or dyspnea."  Monitor was done in 4/22 showing NSR, rare PACs, PVCs and and brief NSVT.  She called the office yesterday with complaints of:  "Spoke with patient about chest pain she describes as coming and going with a dull ache that radiates to her left arm and neck.  Patient states she was driving when the pain started and lasted for 3-4 hours.  Patient stated she took protonix and it felt better but didn't go away.  Patient later took her maxzide and drank hot tea and it went away.   Patient reports some swelling in feet but is unable to obtain a blood pressure reading because of no cuff.  Patient was not short of breath."  She went to the ER yesterday with her complaints of chest discomfort.  She had troponin negative x2.  ECG was negative as well.  It was noted that she was on Xarelto due to concerns of blood clots in the setting of COVID.  She had symptoms in the past concerning for PE although her April 2020 CT did not show evidence of PE.  She has been on Xarelto since that time.  In October 2021, she had an abdominal CT scan showing no evidence of aortic atherosclerosis or vascular calcifications.  She continues to have the dull ache in her chest, which is not related to exertion.  Yesterday, she went to the ER.  She had weight loss surgery in 12/21.  She tried  an extra protonix pill which improved her sx. THe pain has lasted for day.    She takes potassium and iron pills regularly.     Past Medical History:  Diagnosis Date  . Constipation   . Edema   . Headache(784.0)   . Morbid obesity ([place])   . Pulmonary embolism [facility])     Past Surgical History:  Procedure Laterality Date  . BREAST REDUCTION SURGERY    . BREAST SURGERY  2005   reduction  . CESAREAN SECTION N/A 03/20/2016   Procedure: CESAREAN SECTION;  Surgeon: Christophe Louis, MD;  Location: [facility];  Service: Obstetrics;  Laterality: N/A;  . COLONOSCOPY WITH PROPOFOL N/A 05/11/2020   Procedure: COLONOSCOPY WITH PROPOFOL;  Surgeon: [name], MD;  [facility];  Service: Endoscopy;  Laterality: N/A;  . POLYPECTOMY  05/11/2020   Procedure:  POLYPECTOMY;  Surgeon: [name], MD;  [facility];  Service: Endoscopy;;  . TUBAL LIGATION Bilateral 03/20/2016   Procedure: BILATERAL TUBAL LIGATION;  Surgeon: Christophe Louis, MD;  Location: [facility];  Service: Obstetrics;  Laterality: Bilateral;     Current Outpatient Medications  Medication Sig Dispense Refill  . acetaminophen (TYLENOL) 500 MG tablet Take 1,000 mg by mouth every 6 (six) hours as needed for mild pain.    Marland Kitchen albuterol (VENTOLIN HFA) 108 (90 Base) MCG/ACT inhaler Inhale 1-2 puffs into the lungs every 6 (six) hours as needed for wheezing or shortness of breath. Follow-up appt is due must see provider for future refills 18 g 0  . albuterol (VENTOLIN HFA) 108 (90 Base) MCG/ACT inhaler Inhale 1-2 puffs into the lungs every 6 (six) hours as needed for wheezing or shortness of breath. 18 g 0  . cetirizine (ZYRTEC) 10 MG tablet Take 10 mg by mouth daily as needed for allergies.    . Cholecalciferol (VITAMIN D3) 125 MCG (5000 UT) CAPS Take 1 capsule by mouth daily.    . clonazePAM (KLONOPIN) 0.5 MG disintegrating tablet Take 1-2 tablets (0.5-1 mg total) by mouth 2 (two) times daily as needed (panic attacks). 60 tablet 1  . Cyanocobalamin (VITAMIN B-12) 1000 MCG SUBL Place 1 tablet (1,000 mcg total) under the tongue daily. 100 tablet 3  . hydrOXYzine (ATARAX/VISTARIL) 25 MG tablet Take 1 tablet (25 mg total) by mouth every 6 (six) hours. 12 tablet 0  . meclizine (ANTIVERT) 25 MG tablet Take 1 tablet (25 mg total) by mouth 3 (three) times daily as needed for dizziness. 30 tablet 0  . Multiple Vitamin (MULTIVITAMIN) capsule Take 1 capsule by mouth daily.    . pantoprazole (PROTONIX) 40 MG tablet Take 1 tablet (40 mg total) by mouth daily. 90 tablet 3  . potassium chloride SA (KLOR-CON) 20 MEQ tablet Take 20 mEq by mouth 2 (two) times daily.    . rivaroxaban (XARELTO) 20 MG TABS tablet Take 1 tablet (20 mg total) by mouth daily with supper. 30 tablet 5  . saccharomyces  boulardii (FLORASTOR) 250 MG capsule Take 1 capsule (250 mg total) by mouth 2 (two) times daily. Recommend starting a probiotic.    Marland Kitchen triamterene-hydrochlorothiazide (MAXZIDE-25) 37.5-25 MG tablet Take 1 tablet by mouth daily. 90 tablet 2   No current facility-administered medications for this visit.    Allergies:   Patient has no known allergies.    Social History:  The patient  reports that she has never smoked. She has never used smokeless tobacco. She reports previous alcohol use. She reports that she does  not use drugs.   Family History:  The patient's family history includes Deep vein thrombosis in her father; Diabetes in her father, paternal aunt, and paternal grandmother; Heart disease in her maternal grandfather and maternal grandmother; Hypertension in her mother and another family member.    ROS:  Please see the history of present illness.   Otherwise, review of systems are positive for occasional lightheadedness.   All other systems are reviewed and negative.    PHYSICAL EXAM: VS:  BP (!) 130/100   Pulse 76   Ht 5\' 4"  (1.626 m)   Wt 296 lb (134.3 kg)   SpO2 96%   BMI 50.81 kg/m  , BMI Body mass index is 50.81 kg/m. GEN: Well nourished, well developed, in no acute distress  HEENT: normal  Neck: no JVD, carotid bruits, or masses Cardiac: RRR; no murmurs, rubs, or gallops,no edema  Respiratory:  clear to auscultation bilaterally, normal work of breathing GI: soft, nontender, nondistended, + BS, obese MS: no deformity or atrophy  Skin: warm and dry, no rash Neuro:  Strength and sensation are intact Psych: euthymic mood, full affect   EKG:   The ekg ordered 08/07/20 demonstrates NSR, no ST changes   Recent Labs: 01/19/2020: ALT 12 08/07/2020: BUN 11; Creatinine, Ser 0.69; Hemoglobin 13.0; Platelets 302; Potassium 3.6; Sodium 135   Lipid Panel    Component Value Date/Time   CHOL 153 04/21/2018 1631   TRIG 88.0 04/21/2018 1631   HDL 39.40 04/21/2018 1631    CHOLHDL 4 04/21/2018 1631   VLDL 17.6 04/21/2018 1631   LDLCALC 96 04/21/2018 1631     Other studies Reviewed: Additional studies/ records that were reviewed today with results demonstrating: ER records reviewed.  Troponin negative. Potassium and Hbg normal.   ASSESSMENT AND PLAN:  1. Chest pain: Negative work-up in the emergency room.  Atypical.  Sound more GI related since protonix helped.  She is anxious and has many stressors with home schooling her daughter.  Make sure to take enough water with her pills to prevent them from getting stuck in her esophagus.   2. HTN: The current medical regimen is effective;  continue present plan and medications. 3. Morbid obesity: Long-term, she will benefit from whole food, plant-based diet.  Avoid processed foods.  Working on figuring out how to eat post weight loss surgery.  Avoid dehydration as this may cause some of her mild dizziness.  4. Anxiety: has used Klonopin for this.  Was prescribed Vistaril.   5. Anticoagulated: Interested in coming off of Xarelto.  She will d/w Dr. Alain Marion. I think this is reasonable.    Current medicines are reviewed at length with the patient today.  The patient concerns regarding her medicines were addressed.  The following changes have been made:  No change  Labs/ tests ordered today include:  No orders of the defined types were placed in this encounter.   Recommend 150 minutes/week of aerobic exercise Low fat, low carb, high fiber diet recommended  Disposition:   FU with Dr. Acie Fredrickson in 3 months   Signed, Larae Grooms, MD  08/08/2020 12:07 PM    [address], [place], [place]  [address] Phone: [phone]; Fax: [phone]

## 2020-08-07 NOTE — ED Notes (Signed)
ED Provider at bedside. 

## 2020-08-07 NOTE — ED Notes (Signed)
Pain has improved, vss, no c/o pain at this time

## 2020-08-07 NOTE — Discharge Instructions (Signed)
Your work-up today was without any abnormalities.  I recommend following up with your primary care doctor as well as your hematologist.  Ultimately I believe that you would be best served by being taken off the blood thinner you are on.  I am also prescribing you hydroxyzine to use if you would like to for anxiety.  It is a very benign medication and apart from causing some dry mouth has very few side effects.  Please drink plenty of water and take your prescription medications as prescribed.  I also recommend taking Protonix daily as you are prescribed.  Your urinalysis was without any abnormalities either.

## 2020-08-08 ENCOUNTER — Ambulatory Visit (INDEPENDENT_AMBULATORY_CARE_PROVIDER_SITE_OTHER): Payer: 59 | Admitting: Interventional Cardiology

## 2020-08-08 ENCOUNTER — Encounter: Payer: Self-pay | Admitting: Interventional Cardiology

## 2020-08-08 VITALS — BP 130/100 | HR 76 | Ht 64.0 in | Wt 296.0 lb

## 2020-08-08 DIAGNOSIS — R072 Precordial pain: Secondary | ICD-10-CM | POA: Diagnosis not present

## 2020-08-08 DIAGNOSIS — I10 Essential (primary) hypertension: Secondary | ICD-10-CM | POA: Diagnosis not present

## 2020-08-08 NOTE — Patient Instructions (Signed)
Medication Instructions:  Your physician recommends that you continue on your current medications as directed. Please refer to the Current Medication list given to you today.  *If you need a refill on your cardiac medications before your next appointment, please call your pharmacy*   Lab Work: none If you have labs (blood work) drawn today and your tests are completely normal, you will receive your results only by: Marland Kitchen MyChart Message (if you have MyChart) OR . A paper copy in the mail If you have any lab test that is abnormal or we need to change your treatment, we will call you to review the results.   Testing/Procedures: none   Follow-Up: At [facility], you and your health needs are our priority.  As part of our continuing mission to provide you with exceptional heart care, we have created designated Provider Care Teams.  These Care Teams include your primary Cardiologist (physician) and Advanced Practice Providers (APPs -  Physician Assistants and Nurse Practitioners) who all work together to provide you with the care you need, when you need it.  We recommend signing up for the patient portal called "MyChart".  Sign up information is provided on this After Visit Summary.  MyChart is used to connect with patients for Virtual Visits (Telemedicine).  Patients are able to view lab/test results, encounter notes, upcoming appointments, etc.  Non-urgent messages can be sent to your provider as well.   To learn more about what you can do with MyChart, go to [email or web address].    Your next appointment:   3-4 month(s)  The format for your next appointment:   In Person  Provider:   You may see Dr Acie Fredrickson or one of the following Advanced Practice Providers on your designated Care Team:    Richardson Dopp, PA-C  Robbie Lis, [place]    Other Instructions

## 2020-08-08 NOTE — Telephone Encounter (Signed)
Noted. Agree. Thanks.

## 2020-08-09 ENCOUNTER — Telehealth: Payer: Self-pay | Admitting: Cardiovascular Disease

## 2020-08-09 NOTE — Telephone Encounter (Signed)
Spoke with Bridget [name] and continues to have CP  left sided ,left arm pain and occ lightheadedness.Per Bridget [name] Tylenol is not helping with pain and Bridget [name] was still hurting when left ED yesterday Bridget [name] did note eased some but has not went away Per Bridget [name] did not sleep last night due to hurting.Per Bridget [name] is going to call PCP and possibly surgeons office as well for answers re pain .  Will forward to Dr Acie Fredrickson for review and recommendations./cy

## 2020-08-09 NOTE — Telephone Encounter (Signed)
Pt c/o of Chest Pain: STAT if CP now or developed within 24 hours  1. Are you having CP right now?  yes  2. Are you experiencing any other symptoms (ex. SOB, nausea, vomiting, sweating)? Feel lightheaded  3. How long have you been experiencing CP? Since Sunday- went to ER on Monday night   4. Is your CP continuous or coming and going? Comes and goes, but stays a long time when it comes  5. Have you taken Nitroglycerin? Do not have any ?

## 2020-08-09 NOTE — Telephone Encounter (Signed)
Pt was just seen by Lendell Caprice on 5/24 (Tue)   CP did not appear to be cardiac I am not sure what else to add.  Will forward to [employer] as well Encourage pt to contact PCP

## 2020-08-09 NOTE — Telephone Encounter (Signed)
Patient is having severe chest pains that is affecting her sleep. Transferred to [facility].   Triage nurse called back, they advised her to go to call EMS now. Patient refused.   Please advise.

## 2020-08-10 ENCOUNTER — Telehealth (INDEPENDENT_AMBULATORY_CARE_PROVIDER_SITE_OTHER): Payer: 59 | Admitting: Internal Medicine

## 2020-08-10 ENCOUNTER — Encounter: Payer: Self-pay | Admitting: Internal Medicine

## 2020-08-10 ENCOUNTER — Telehealth: Payer: Self-pay | Admitting: Internal Medicine

## 2020-08-10 DIAGNOSIS — R0789 Other chest pain: Secondary | ICD-10-CM | POA: Diagnosis not present

## 2020-08-10 DIAGNOSIS — I2699 Other pulmonary embolism without acute cor pulmonale: Secondary | ICD-10-CM | POA: Diagnosis not present

## 2020-08-10 MED ORDER — HYOSCYAMINE SULFATE 0.125 MG PO TABS: 0.1250 mg | ORAL_TABLET | Freq: Four times a day (QID) | ORAL | 3 refills | Status: DC | PRN

## 2020-08-10 NOTE — Assessment & Plan Note (Signed)
Recurrent chest pain of unclear etiology.  Likely GI related.  We will try hyoscyamine tablets as needed.  Continue Protonix

## 2020-08-10 NOTE — Assessment & Plan Note (Signed)
Status post sleve procedure.  The patient lost a little weight.  She went down from 330 to 292 lbs

## 2020-08-10 NOTE — Telephone Encounter (Signed)
[facility] FYI 08/09/20  PT has had severe chest pain on L and in center and went to ER Mon and cardiologist yesterday - the troponin level was NL and he felt she didnt need more tests. She had EKG and chest Xray. Pt is still in pain and a lot of unanswered questions. It was in center L and now it is farther left. She has been on protonix awhile. Today it is still here and keeps coming back. No fever. No cough and no congestion. PCP was not seen this week.  Advised to call EMS 911 now.   she declined to call 911 - she wants to speak with PCP about it - left message at backline with [place] and she is going to send message to Dr. Judeen Hammans nurse so she can discuss with another MD bc PCP is out. Reiterated disposition and pt wants to wait and see and talk to the office when it calls her back

## 2020-08-10 NOTE — Progress Notes (Signed)
Virtual Visit via Video Note  I connected with Bridget [name] on 08/10/20 at 10:00 AM EDT by a video enabled telemedicine application and verified that I am speaking with the correct person using two identifiers.   I discussed the limitations of evaluation and management by telemedicine and the availability of in person appointments. The patient expressed understanding and agreed to proceed.  I was located at our home office. The patient was at home. There was no one else present in the visit.   History of Present Illness: The patient is complaining of sharp pains in the chest in the middle into the left side off and on and of several weeks duration.  She has been having bad episodes since Sunday.  She went to ER on Monday.  Chest x-ray, EKG, troponin were done and came back negative.  She was advised to continue with Protonix and see her cardiologist Dr. Acie Fredrickson.  The patient saw Dr. Irish Lack.  Her pain was evaluated and thought to be noncardiac.  The patient admits to a lot of stress and anxiety.  She is status post sling procedure she lost around 40 pounds of weight.  No pain in the right upper quadrant or nausea and vomiting. ER and cardiology notes, labs, tests were reviewed.   Observations/Objective: The patient appears to be in no acute distress, looks well.  Assessment and Plan:  See my Assessment and Plan. Follow Up Instructions:    I discussed the assessment and treatment plan with the patient. The patient was provided an opportunity to ask questions and all were answered. The patient agreed with the plan and demonstrated an understanding of the instructions.   The patient was advised to call back or seek an in-person evaluation if the symptoms worsen or if the condition fails to improve as anticipated.  I provided face-to-face time during this encounter. We were at different locations.   Walker Kehr, MD

## 2020-08-10 NOTE — Assessment & Plan Note (Signed)
We can needs again after more weight loss and discuss discontinuation of Xarelto

## 2020-08-14 ENCOUNTER — Other Ambulatory Visit: Payer: Self-pay | Admitting: Internal Medicine

## 2020-08-22 ENCOUNTER — Other Ambulatory Visit: Payer: Self-pay | Admitting: Internal Medicine

## 2020-08-31 ENCOUNTER — Other Ambulatory Visit: Payer: Self-pay | Admitting: Internal Medicine

## 2020-10-03 ENCOUNTER — Other Ambulatory Visit: Payer: Self-pay | Admitting: Internal Medicine

## 2020-10-04 MED ORDER — PANTOPRAZOLE SODIUM 40 MG PO TBEC: 40.0000 mg | DELAYED_RELEASE_TABLET | Freq: Every day | ORAL | 0 refills | Status: DC

## 2020-10-04 NOTE — Telephone Encounter (Signed)
Patient calling to request refill  for  pantoprazole (PROTONIX) 40 MG tablet as well as Potassium  No medication remaining

## 2020-10-10 ENCOUNTER — Encounter: Payer: Self-pay | Admitting: Internal Medicine

## 2020-10-10 ENCOUNTER — Ambulatory Visit (INDEPENDENT_AMBULATORY_CARE_PROVIDER_SITE_OTHER): Payer: 59 | Admitting: Internal Medicine

## 2020-10-10 ENCOUNTER — Other Ambulatory Visit: Payer: Self-pay

## 2020-10-10 VITALS — BP 130/92 | HR 69 | Temp 97.8°F | Ht 64.0 in | Wt 288.4 lb

## 2020-10-10 DIAGNOSIS — F419 Anxiety disorder, unspecified: Secondary | ICD-10-CM

## 2020-10-10 DIAGNOSIS — E559 Vitamin D deficiency, unspecified: Secondary | ICD-10-CM | POA: Diagnosis not present

## 2020-10-10 DIAGNOSIS — R002 Palpitations: Secondary | ICD-10-CM

## 2020-10-10 DIAGNOSIS — F32A Depression, unspecified: Secondary | ICD-10-CM | POA: Diagnosis not present

## 2020-10-10 MED ORDER — TRIAMTERENE-HCTZ 37.5-25 MG PO TABS: 1.0000 | ORAL_TABLET | Freq: Every day | ORAL | 3 refills | Status: DC

## 2020-10-10 MED ORDER — POTASSIUM CHLORIDE CRYS ER 20 MEQ PO TBCR: 20.0000 meq | EXTENDED_RELEASE_TABLET | Freq: Two times a day (BID) | ORAL | 3 refills | Status: DC

## 2020-10-10 MED ORDER — ONDANSETRON HCL 4 MG PO TABS: 4.0000 mg | ORAL_TABLET | Freq: Three times a day (TID) | ORAL | 2 refills | Status: DC | PRN

## 2020-10-10 MED ORDER — RIZATRIPTAN BENZOATE 10 MG PO TABS: 10.0000 mg | ORAL_TABLET | Freq: Once | ORAL | 11 refills | Status: DC | PRN

## 2020-10-10 NOTE — Progress Notes (Signed)
Subjective:  Patient ID: Bridget [name], female    DOB: 13-Aug-1975  Age: 45 y.o. MRN: [id number]  CC: Follow-up (6 month f/u)   HPI Bridget [name] presents for DVT/PE, HTN, low K C/o migraines - severe at times Total wt loss 55 lbs   Outpatient Medications Prior to Visit  Medication Sig Dispense Refill   acetaminophen (TYLENOL) 500 MG tablet Take 1,000 mg by mouth every 6 (six) hours as needed for mild pain.     albuterol (VENTOLIN HFA) 108 (90 Base) MCG/ACT inhaler Inhale 1-2 puffs into the lungs every 6 (six) hours as needed for wheezing or shortness of breath. 18 g 0   cetirizine (ZYRTEC) 10 MG tablet Take 10 mg by mouth daily as needed for allergies.     Cholecalciferol (VITAMIN D3) 125 MCG (5000 UT) CAPS Take 1 capsule by mouth daily.     clonazePAM (KLONOPIN) 0.5 MG disintegrating tablet Take 1-2 tablets (0.5-1 mg total) by mouth 2 (two) times daily as needed (panic attacks). 60 tablet 1   Cyanocobalamin (VITAMIN B-12) 1000 MCG SUBL Place 1 tablet (1,000 mcg total) under the tongue daily. 100 tablet 3   Multiple Vitamin (MULTIVITAMIN) capsule Take 1 capsule by mouth daily.     pantoprazole (PROTONIX) 40 MG tablet Take 1 tablet (40 mg total) by mouth daily. Must keep scheduled appt for future refills 30 tablet 0   potassium chloride SA (KLOR-CON) 20 MEQ tablet Take 1 tablet (20 mEq total) by mouth 2 (two) times daily. Must keep scheduled appt for future refills need potassium check 60 tablet 0   saccharomyces boulardii (FLORASTOR) 250 MG capsule Take 1 capsule (250 mg total) by mouth 2 (two) times daily. Recommend starting a probiotic.     triamterene-hydrochlorothiazide (MAXZIDE-25) 37.5-25 MG tablet Take 1 tablet by mouth daily. 90 tablet 2   XARELTO 20 MG TABS tablet TAKE 1 TABLET BY MOUTH ONCE DAILY WITH SUPPER 30 tablet 3   albuterol (VENTOLIN HFA) 108 (90 Base) MCG/ACT inhaler Inhale 1-2 puffs into the lungs every 6 (six) hours as needed for wheezing or shortness of breath.  Follow-up appt is due must see provider for future refills 18 g 0   hydrOXYzine (ATARAX/VISTARIL) 25 MG tablet Take 1 tablet (25 mg total) by mouth every 6 (six) hours. (Patient not taking: Reported on 10/10/2020) 12 tablet 0   hyoscyamine (LEVSIN) 0.125 MG tablet Take 1-2 tablets (0.125-0.25 mg total) by mouth every 6 (six) hours as needed for up to 10 days for cramping (chest pain, spasm). 100 tablet 3   meclizine (ANTIVERT) 25 MG tablet Take 1 tablet (25 mg total) by mouth 3 (three) times daily as needed for dizziness. (Patient not taking: Reported on 10/10/2020) 30 tablet 0   No facility-administered medications prior to visit.    ROS: Review of Systems  Constitutional:  Negative for activity change, appetite change, chills, fatigue and unexpected weight change.  HENT:  Negative for congestion, mouth sores and sinus pressure.   Eyes:  Negative for visual disturbance.  Respiratory:  Negative for cough and chest tightness.   Gastrointestinal:  Negative for abdominal pain and nausea.  Genitourinary:  Negative for difficulty urinating, frequency and vaginal pain.  Musculoskeletal:  Negative for back pain and gait problem.  Skin:  Negative for pallor and rash.  Neurological:  Positive for headaches. Negative for dizziness, tremors, weakness and numbness.  Psychiatric/Behavioral:  Negative for confusion, sleep disturbance and suicidal ideas. The patient is nervous/anxious.    Objective:  BP (!) 130/92 (BP Location: Left Arm)   Pulse 69   Temp 97.8 F (36.6 C) (Oral)   Ht '5\' 4"'$  (1.626 m)   Wt 288 lb 6.4 oz (130.8 kg)   SpO2 98%   BMI 49.50 kg/m   BP Readings from Last 3 Encounters:  10/10/20 (!) 130/92  08/08/20 (!) 130/100  08/07/20 128/84    Wt Readings from Last 3 Encounters:  10/10/20 288 lb 6.4 oz (130.8 kg)  08/08/20 296 lb (134.3 kg)  05/11/20 300 lb (136.1 kg)    Physical Exam Constitutional:      General: She is not in acute distress.    Appearance: She is  well-developed. She is obese.  HENT:     Head: Normocephalic.     Right Ear: External ear normal.     Left Ear: External ear normal.     Nose: Nose normal.  Eyes:     General:        Right eye: No discharge.        Left eye: No discharge.     Conjunctiva/sclera: Conjunctivae normal.     Pupils: Pupils are equal, round, and reactive to light.  Neck:     Thyroid: No thyromegaly.     Vascular: No JVD.     Trachea: No tracheal deviation.  Cardiovascular:     Rate and Rhythm: Normal rate and regular rhythm.     Heart sounds: Normal heart sounds.  Pulmonary:     Effort: No respiratory distress.     Breath sounds: No stridor. No wheezing.  Abdominal:     General: Bowel sounds are normal. There is no distension.     Palpations: Abdomen is soft. There is no mass.     Tenderness: There is no abdominal tenderness. There is no guarding or rebound.  Musculoskeletal:        General: No tenderness.     Cervical back: Normal range of motion and neck supple. No rigidity.  Lymphadenopathy:     Cervical: No cervical adenopathy.  Skin:    Findings: No erythema or rash.  Neurological:     Cranial Nerves: No cranial nerve deficit.     Motor: No abnormal muscle tone.     Coordination: Coordination normal.     Deep Tendon Reflexes: Reflexes normal.  Psychiatric:        Behavior: Behavior normal.        Thought Content: Thought content normal.        Judgment: Judgment normal.    Lab Results  Component Value Date   WBC 8.4 08/07/2020   HGB 13.0 08/07/2020   HCT 38.5 08/07/2020   PLT 302 08/07/2020   GLUCOSE 111 (H) 08/07/2020   CHOL 153 04/21/2018   TRIG 88.0 04/21/2018   HDL 39.40 04/21/2018   LDLCALC 96 04/21/2018   ALT 12 01/19/2020   AST 19 01/19/2020   NA 135 08/07/2020   K 3.6 08/07/2020   CL 104 08/07/2020   CREATININE 0.69 08/07/2020   BUN 11 08/07/2020   CO2 23 08/07/2020   TSH 0.88 07/13/2019   HGBA1C 5.3 10/29/2017    DG Chest 2 View  Result Date:  08/07/2020 CLINICAL DATA:  Chest pain. EXAM: CHEST - 2 VIEW COMPARISON:  02/25/2020 FINDINGS: The cardiomediastinal contours are normal. The lungs are clear. Pulmonary vasculature is normal. No consolidation, pleural effusion, or pneumothorax. No acute osseous abnormalities are seen. Thoracolumbar scoliosis. IMPRESSION: No acute chest findings. Electronically Signed   By: Threasa Beards  [place] M.D.   On: 08/07/2020 20:20    Assessment & Plan:     Walker Kehr, MD

## 2020-10-11 LAB — IRON,TIBC AND FERRITIN PANEL
%SAT: 40 % (calc) (ref 16–45)
Ferritin: 21 ng/mL (ref 16–232)
Iron: 126 ug/dL (ref 40–190)
TIBC: 318 mcg/dL (calc) (ref 250–450)

## 2020-10-11 LAB — COMPREHENSIVE METABOLIC PANEL
ALT: 14 U/L (ref 0–35)
AST: 20 U/L (ref 0–37)
Albumin: 4.2 g/dL (ref 3.5–5.2)
Alkaline Phosphatase: 43 U/L (ref 39–117)
BUN: 14 mg/dL (ref 6–23)
CO2: 25 mEq/L (ref 19–32)
Calcium: 9.6 mg/dL (ref 8.4–10.5)
Chloride: 102 mEq/L (ref 96–112)
Creatinine, Ser: 0.69 mg/dL (ref 0.40–1.20)
GFR: 106.35 mL/min (ref >60.00)
Glucose, Bld: 96 mg/dL (ref 70–99)
Potassium: 4.1 mEq/L (ref 3.5–5.1)
Sodium: 139 mEq/L (ref 135–145)
Total Bilirubin: 1.1 mg/dL (ref 0.2–1.2)
Total Protein: 7.5 g/dL (ref 6.0–8.3)

## 2020-10-11 LAB — CBC WITH DIFFERENTIAL/PLATELET
Basophils Absolute: 0.1 10*3/uL (ref 0.0–0.1)
Basophils Relative: 1.1 % (ref 0.0–3.0)
Eosinophils Absolute: 0.2 10*3/uL (ref 0.0–0.7)
Eosinophils Relative: 3.1 % (ref 0.0–5.0)
HCT: 39.5 % (ref 36.0–46.0)
Hemoglobin: 13 g/dL (ref 12.0–15.0)
Lymphocytes Relative: 33.6 % (ref 12.0–46.0)
Lymphs Abs: 2.1 10*3/uL (ref 0.7–4.0)
MCHC: 33 g/dL (ref 30.0–36.0)
MCV: 96.1 fl (ref 78.0–100.0)
Monocytes Absolute: 0.3 10*3/uL (ref 0.1–1.0)
Monocytes Relative: 5.2 % (ref 3.0–12.0)
Neutro Abs: 3.6 10*3/uL (ref 1.4–7.7)
Neutrophils Relative %: 57 % (ref 43.0–77.0)
Platelets: 343 10*3/uL (ref 150.0–400.0)
RBC: 4.1 Mil/uL (ref 3.87–5.11)
RDW: 13.1 % (ref 11.5–15.5)
WBC: 6.3 10*3/uL (ref 4.0–10.5)

## 2020-10-11 LAB — LIPID PANEL
Cholesterol: 147 mg/dL (ref 0–200)
HDL: 44.3 mg/dL (ref >39.00)
LDL Cholesterol: 81 mg/dL (ref 0–99)
NonHDL: 102.55
Total CHOL/HDL Ratio: 3
Triglycerides: 109 mg/dL (ref 0.0–149.0)
VLDL: 21.8 mg/dL (ref 0.0–40.0)

## 2020-10-11 LAB — TSH: TSH: 0.76 u[IU]/mL (ref 0.35–5.50)

## 2020-10-11 LAB — URINALYSIS, ROUTINE W REFLEX MICROSCOPIC
Nitrite: NEGATIVE
Specific Gravity, Urine: >=1.03 — AB (ref 1.000–1.030)
Total Protein, Urine: 100 — AB
Urine Glucose: NEGATIVE
Urobilinogen, UA: 1 (ref 0.0–1.0)
pH: 5.5 (ref 5.0–8.0)

## 2020-10-11 LAB — D-DIMER, QUANTITATIVE: D-Dimer, Quant: 0.35 mcg/mL FEU (ref <0.50)

## 2020-10-11 LAB — VITAMIN D 25 HYDROXY (VIT D DEFICIENCY, FRACTURES): VITD: 56.87 ng/mL (ref 30.00–100.00)

## 2020-10-11 NOTE — Assessment & Plan Note (Signed)
Stress discussed.  Stress reduction discussed.  Continue with as needed clonazepam and Lexapro

## 2020-10-11 NOTE — Assessment & Plan Note (Signed)
Continue with vitamin D 

## 2020-10-11 NOTE — Assessment & Plan Note (Signed)
Recurrent palpitations.  Discussed with patient.  She will take clonazepam for anxiety.

## 2020-10-11 NOTE — Assessment & Plan Note (Signed)
She is status post LEEP procedure.  She is taking her vitamins.  Continue with weight loss

## 2020-11-01 ENCOUNTER — Emergency Department (HOSPITAL_BASED_OUTPATIENT_CLINIC_OR_DEPARTMENT_OTHER)
Admission: EM | Admit: 2020-11-01 | Discharge: 2020-11-02 | Disposition: A | Discharge status: Home / Self Care | Payer: 59 | Attending: Emergency Medicine | Admitting: Emergency Medicine

## 2020-11-01 ENCOUNTER — Encounter (HOSPITAL_BASED_OUTPATIENT_CLINIC_OR_DEPARTMENT_OTHER): Payer: Self-pay

## 2020-11-01 ENCOUNTER — Emergency Department (HOSPITAL_BASED_OUTPATIENT_CLINIC_OR_DEPARTMENT_OTHER): Payer: 59

## 2020-11-01 ENCOUNTER — Other Ambulatory Visit: Payer: Self-pay

## 2020-11-01 DIAGNOSIS — R0602 Shortness of breath: Secondary | ICD-10-CM | POA: Diagnosis not present

## 2020-11-01 DIAGNOSIS — Z7901 Long term (current) use of anticoagulants: Secondary | ICD-10-CM | POA: Insufficient documentation

## 2020-11-01 DIAGNOSIS — R1013 Epigastric pain: Secondary | ICD-10-CM | POA: Insufficient documentation

## 2020-11-01 DIAGNOSIS — R079 Chest pain, unspecified: Secondary | ICD-10-CM | POA: Diagnosis not present

## 2020-11-01 DIAGNOSIS — R072 Precordial pain: Secondary | ICD-10-CM

## 2020-11-01 DIAGNOSIS — I10 Essential (primary) hypertension: Secondary | ICD-10-CM | POA: Diagnosis not present

## 2020-11-01 DIAGNOSIS — M549 Dorsalgia, unspecified: Secondary | ICD-10-CM | POA: Insufficient documentation

## 2020-11-01 DIAGNOSIS — R1084 Generalized abdominal pain: Secondary | ICD-10-CM

## 2020-11-01 DIAGNOSIS — Z79899 Other long term (current) drug therapy: Secondary | ICD-10-CM | POA: Diagnosis not present

## 2020-11-01 LAB — BASIC METABOLIC PANEL
Anion gap: 12 (ref 5–15)
BUN: 13 mg/dL (ref 6–20)
CO2: 24 mmol/L (ref 22–32)
Calcium: 8.9 mg/dL (ref 8.9–10.3)
Chloride: 101 mmol/L (ref 98–111)
Creatinine, Ser: 0.71 mg/dL (ref 0.44–1.00)
GFR, Estimated: >60 mL/min (ref >60)
Glucose, Bld: 99 mg/dL (ref 70–99)
Potassium: 3.6 mmol/L (ref 3.5–5.1)
Sodium: 137 mmol/L (ref 135–145)

## 2020-11-01 LAB — CBC
HCT: 36.9 % (ref 36.0–46.0)
Hemoglobin: 12.7 g/dL (ref 12.0–15.0)
MCH: 32.4 pg (ref 26.0–34.0)
MCHC: 34.4 g/dL (ref 30.0–36.0)
MCV: 94.1 fL (ref 80.0–100.0)
Platelets: 333 10*3/uL (ref 150–400)
RBC: 3.92 MIL/uL (ref 3.87–5.11)
RDW: 12.6 % (ref 11.5–15.5)
WBC: 7.3 10*3/uL (ref 4.0–10.5)
nRBC: 0 % (ref 0.0–0.2)

## 2020-11-01 LAB — TROPONIN I (HIGH SENSITIVITY): Troponin I (High Sensitivity): 4 ng/L (ref <18)

## 2020-11-01 MED ORDER — LIDOCAINE VISCOUS HCL 2 % MT SOLN
15.0000 mL | Freq: Once | OROMUCOSAL | Status: AC
  Administered 2020-11-01: 15 mL via ORAL
  Filled 2020-11-01: qty 15

## 2020-11-01 MED ORDER — ALUM & MAG HYDROXIDE-SIMETH 200-200-20 MG/5ML PO SUSP
30.0000 mL | Freq: Once | ORAL | Status: AC
  Administered 2020-11-01: 30 mL via ORAL
  Filled 2020-11-01: qty 30

## 2020-11-01 NOTE — ED Triage Notes (Signed)
Pt c/o CP x ~30 min-states she SOB x 4 days-denies fever/flu sx-NAD-steady gait

## 2020-11-01 NOTE — ED Notes (Signed)
Pt ambulatory to restroom with independent steady gait °

## 2020-11-01 NOTE — ED Notes (Signed)
Unable to obtain IV access for CTA. This RN attempted x1. Resp tech Sonia Baller at bedside attempting with [place]. Dr. Elliot Gurney informed.

## 2020-11-01 NOTE — ED Provider Notes (Signed)
[facility] EMERGENCY DEPARTMENT Provider Note   CSN: [id number] Arrival date & time: 11/01/20  1857     History Chief Complaint  Patient presents with   Chest Pain    Bridget [name] is a 45 y.o. female.  The history is provided by the patient. No language interpreter was used.  Chest Pain Pain location:  Epigastric Pain quality: sharp   Pain radiates to:  Does not radiate Pain severity:  Severe Onset quality:  Sudden Duration:  12 hours Timing:  Constant Progression:  Unchanged Chronicity:  New Context: breathing and at rest   Worsened by:  Deep breathing Ineffective treatments:  None tried Associated symptoms: back pain and shortness of breath   Associated symptoms: no cough, no diaphoresis, no dizziness, no fever, no nausea and no vomiting       Past Medical History:  Diagnosis Date   Constipation    Edema    Headache(784.0)    Morbid obesity ([place])    Pulmonary embolism (H[facility])     Patient Active Problem List   Diagnosis Date Noted   Perforated diverticulum 12/31/2019   GERD (gastroesophageal reflux disease) 09/29/2019   Constipation 09/29/2019   Foot pain, right 08/04/2019   BPPV (benign paroxysmal positional vertigo) 12/04/2018   Chest discomfort 09/16/2018   Pulmonary embolism ([place]) 07/22/2018   Anxiety and depression 07/13/2018   Arm swelling 07/06/2018   DOE (dyspnea on exertion) 06/30/2018   HTN (hypertension) 10/29/2017   Reactive depression (situational) 10/29/2017   Pruritic condition 09/25/2016   Urticaria 09/25/2016   Anemia 04/23/2016   Well adult exam 05/06/2014   Otitis media 12/13/2011   Fatigue 12/13/2011   B12 deficiency 12/13/2011   Vitamin D deficiency 12/13/2011   Palpitations 08/06/2010   Syncope and collapse 08/06/2010   EDEMA 10/14/2007   UTI 07/21/2007   LOW BACK PAIN 07/21/2007   ELEVATED BP 07/21/2007   Morbid (severe) obesity due to excess calories ([place]) 11/15/2006   Migraines 11/15/2006    Past  Surgical History:  Procedure Laterality Date   BREAST REDUCTION SURGERY     BREAST SURGERY  2005   reduction   CESAREAN SECTION N/A 03/20/2016   Procedure: CESAREAN SECTION;  Surgeon: Christophe Louis, MD;  Location: [facility];  Service: Obstetrics;  Laterality: N/A;   COLONOSCOPY WITH PROPOFOL N/A 05/11/2020   Procedure: COLONOSCOPY WITH PROPOFOL;  Surgeon: [name], MD;  Location: [facility];  Service: Endoscopy;  Laterality: N/A;   POLYPECTOMY  05/11/2020   Procedure: POLYPECTOMY;  Surgeon: [name], MD;  Location: [facility];  Service: Endoscopy;;   TUBAL LIGATION Bilateral 03/20/2016   Procedure: BILATERAL TUBAL LIGATION;  Surgeon: Christophe Louis, MD;  Location: [facility];  Service: Obstetrics;  Laterality: Bilateral;     OB History     Gravida  7   Para  2   Term  2   Preterm      AB  5   Living  2      SAB  3   IAB  2   Ectopic      Multiple  0   Live Births  2           Family History  Problem Relation Age of Onset   Deep vein thrombosis Father    Diabetes Father    Hypertension Mother    Hypertension Other    Diabetes Paternal Aunt    Heart disease Maternal Grandmother    Heart disease Maternal  Grandfather    Diabetes Paternal Grandmother     Social History   Tobacco Use   Smoking status: Never   Smokeless tobacco: Never  Vaping Use   Vaping Use: Never used  Substance Use Topics   Alcohol use: Not Currently   Drug use: No    Home Medications Prior to Admission medications   Medication Sig Start Date End Date Taking? Authorizing Provider  acetaminophen (TYLENOL) 500 MG tablet Take 1,000 mg by mouth every 6 (six) hours as needed for mild pain.    [provider]  albuterol (VENTOLIN HFA) 108 (90 Base) MCG/ACT inhaler Inhale 1-2 puffs into the lungs every 6 (six) hours as needed for wheezing or shortness of breath. 06/21/20   Marrian Salvage, FNP  cetirizine (ZYRTEC) 10 MG tablet Take 10 mg by mouth  daily as needed for allergies.    [provider]  Cholecalciferol (VITAMIN D3) 125 MCG (5000 UT) CAPS Take 1 capsule by mouth daily.    [provider]  clonazePAM (KLONOPIN) 0.5 MG disintegrating tablet Take 1-2 tablets (0.5-1 mg total) by mouth 2 (two) times daily as needed (panic attacks). 09/29/19   Plotnikov, Evie Lacks, MD  Cyanocobalamin (VITAMIN B-12) 1000 MCG SUBL Place 1 tablet (1,000 mcg total) under the tongue daily. 08/04/19   Plotnikov, Evie Lacks, MD  Multiple Vitamin (MULTIVITAMIN) capsule Take 1 capsule by mouth daily.    [provider]  ondansetron (ZOFRAN) 4 MG tablet Take 1 tablet (4 mg total) by mouth every 8 (eight) hours as needed for nausea or vomiting. 10/10/20   Plotnikov, Evie Lacks, MD  pantoprazole (PROTONIX) 40 MG tablet Take 1 tablet (40 mg total) by mouth daily. Must keep scheduled appt for future refills 10/04/20   Plotnikov, Evie Lacks, MD  potassium chloride SA (KLOR-CON) 20 MEQ tablet Take 1 tablet (20 mEq total) by mouth 2 (two) times daily. Must keep scheduled appt for future refills need potassium check 10/10/20   Plotnikov, Evie Lacks, MD  rizatriptan (MAXALT) 10 MG tablet Take 1 tablet (10 mg total) by mouth once as needed for up to 1 dose for migraine. May repeat in 2 hours if needed 10/10/20   Plotnikov, Evie Lacks, MD  saccharomyces boulardii (FLORASTOR) 250 MG capsule Take 1 capsule (250 mg total) by mouth 2 (two) times daily. Recommend starting a probiotic. 01/04/20   Meuth, Blaine Hamper, PA-C  triamterene-hydrochlorothiazide (MAXZIDE-25) 37.5-25 MG tablet Take 1 tablet by mouth daily. 10/10/20   Plotnikov, Evie Lacks, MD  XARELTO 20 MG TABS tablet TAKE 1 TABLET BY MOUTH ONCE DAILY WITH SUPPER 08/14/20   Plotnikov, Evie Lacks, MD    Allergies    Patient has no known allergies.  Review of Systems   Review of Systems  Constitutional:  Negative for chills, diaphoresis and fever.  Respiratory:  Positive for shortness of breath. Negative for cough  and chest tightness.   Cardiovascular:  Positive for chest pain.  Gastrointestinal:  Negative for constipation, diarrhea, nausea and vomiting.  Musculoskeletal:  Positive for back pain.  Neurological:  Negative for dizziness, syncope and light-headedness.   Physical Exam Updated Vital Signs BP 132/67   Pulse 62   Temp 98.6 F (37 C) (Oral)   Resp 12   Ht '5\' 4"'$  (1.626 m)   Wt 130.2 kg   LMP 10/07/2020 Comment: pt states no chance of pregnancy  SpO2 100%   BMI 49.26 kg/m   Physical Exam Constitutional:      Appearance: She is  obese.  HENT:     Head: Normocephalic and atraumatic.  Cardiovascular:     Rate and Rhythm: Normal rate and regular rhythm.     Heart sounds: Normal heart sounds.  Pulmonary:     Effort: Pulmonary effort is normal.     Breath sounds: Normal breath sounds.  Chest:     Chest wall: No deformity or tenderness.  Abdominal:     General: Bowel sounds are normal.     Palpations: Abdomen is soft.  Musculoskeletal:     Right lower leg: No tenderness. No edema.     Left lower leg: No tenderness. No edema.  Skin:    General: Skin is warm and dry.  Neurological:     General: No focal deficit present.     Mental Status: She is alert and oriented to person, place, and time.  Psychiatric:        Mood and Affect: Mood normal.        Behavior: Behavior normal.    ED Results / Procedures / Treatments   Labs (all labs ordered are listed, but only abnormal results are displayed) Labs Reviewed  CBC  PREGNANCY, URINE  BASIC METABOLIC PANEL  TROPONIN I (HIGH SENSITIVITY)    EKG None  Radiology DG Chest 2 View  Result Date: 11/01/2020 CLINICAL DATA:  Chest pain.  Shortness of breath. EXAM: CHEST - 2 VIEW COMPARISON:  08/07/2020 FINDINGS: The cardiomediastinal contours are normal. The lungs are clear. Pulmonary vasculature is normal. No consolidation, pleural effusion, or pneumothorax. Similar scoliotic curvature of the thoracic spine. No acute osseous  abnormalities are seen. IMPRESSION: No acute chest findings. Electronically Signed   By: Keith Rake M.D.   On: 11/01/2020 19:36    Procedures Procedures   Medications Ordered in ED Medications - No data to display  ED Course  I have reviewed the triage vital signs and the nursing notes.  Pertinent labs & imaging results that were available during my care of the patient were reviewed by me and considered in my medical decision making (see chart for details).  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 SOB is intermittent. Not currently experiencing any SOB. She does not note any particular event or action that worsens SOB. She reports that shortness of breath has not fully resolved since it onset 3 days ago, but she is not currently experiencing any SOB. She reports that she presented to the ED because earlier today she experienced sharp pain that originated in her epigastric region and has since moved up into chest. She is having epigastric and chest pain simultaneously provoked with deep breaths. She reports it is a sharp pain. Worse with breathing. Denies recent travel or sitting for extended periods of time. Not on birth control. No swelling or pain of lower extremities. Has had PE in the past, but this was during initial COVID pandemic and reports that she might have had COVID at that time. Reports that during that event she was also able to feel the pain ascend, this  pain is different.   EKG same as prior. Pain does not radiate. Hemodynamically stable. Troponins have stayed stable.   CXR clear, no cough or congestoin. Afebrile. EKG and CXR reassuring showing no acute  cardiopulmonary disease.  Pain is not worsened by eating. No nausea, vomiting, nor diarrhea. Has been treated with GI cocktail in the past for indigestion and reflux.    Pain not related to positional nor physical activity other than deep breaths.   PE study and CT abd to assess for PE given prior history of PE. CT abd to assess for potential gastric sleeve complications.  Final Clinical Impression(s) / ED Diagnoses Final diagnoses:  None    Rx / DC Orders ED Discharge Orders     None        Delene Ruffini, MD 11/02/20 0056    Tegeler, Gwenyth Allegra, MD 11/03/20 [phone]

## 2020-11-02 ENCOUNTER — Other Ambulatory Visit: Payer: Self-pay | Admitting: Internal Medicine

## 2020-11-02 ENCOUNTER — Emergency Department (HOSPITAL_BASED_OUTPATIENT_CLINIC_OR_DEPARTMENT_OTHER): Payer: 59

## 2020-11-02 ENCOUNTER — Emergency Department (HOSPITAL_BASED_OUTPATIENT_CLINIC_OR_DEPARTMENT_OTHER): Admission: EM | Admit: 2020-11-02 | Payer: 59 | Source: Home / Self Care

## 2020-11-02 LAB — PREGNANCY, URINE: Preg Test, Ur: NEGATIVE

## 2020-11-02 LAB — TROPONIN I (HIGH SENSITIVITY): Troponin I (High Sensitivity): 4 ng/L (ref <18)

## 2020-11-02 MED ORDER — IOHEXOL 350 MG/ML SOLN
100.0000 mL | Freq: Once | INTRAVENOUS | Status: AC | PRN
  Administered 2020-11-02: 100 mL via INTRAVENOUS

## 2020-11-02 MED ORDER — ACETAMINOPHEN 500 MG PO TABS
1000.0000 mg | ORAL_TABLET | Freq: Once | ORAL | Status: AC
  Administered 2020-11-02: 1000 mg via ORAL
  Filled 2020-11-02: qty 2

## 2020-11-02 NOTE — ED Notes (Addendum)
Pt POV transfer to [facility] ED. IVs secure.

## 2020-11-02 NOTE — ED Provider Notes (Signed)
Patient transferred for CTa chest with follow through of abdomen pelvis.  CT down at [facility]  Will obtain CT here.  CT scan negative for PE or pneumonia.  No intra-abdominal pathology that is acute was identified.  Based on my history with the patient it sounds mostly gastric in nature.  She is already on Protonix.  We will have her try to identify what things seem to make this worse.  Have her follow-up with her family doctor.  Procedure note: Ultrasound Guided Peripheral IV Ultrasound guided peripheral 1.88 inch angiocath IV placement performed by me. Indications: Nursing unable to place IV. Details: The antecubital fossa and upper arm were evaluated with a multifrequency linear probe. Patent brachial veins were noted. 1 attempt was made to cannulate a vein under realtime US guidance with successful cannulation of the vein and catheter placement. There is return of non-pulsatile dark red blood. The patient tolerated the procedure well without complications. Images archived electronically.  CPT codes: [phone] and [id number]   4:00 AM:  I have discussed the diagnosis/risks/treatment options with the patient and believe the pt to be eligible for discharge home to follow-up with PCP. We also discussed returning to the ED immediately if new or worsening sx occur. We discussed the sx which are most concerning (e.g., sudden worsening pain, fever, inability to tolerate by mouth, exertional symptoms) that necessitate immediate return. Medications administered to the patient during their visit and any new prescriptions provided to the patient are listed below.  Medications given during this visit Medications  alum & mag hydroxide-simeth (MAALOX/MYLANTA) 200-200-20 MG/5ML suspension 30 mL (30 mLs Oral Given 11/01/20 2130)    And  lidocaine (XYLOCAINE) 2 % viscous mouth solution 15 mL (15 mLs Oral Given 11/01/20 2130)  acetaminophen (TYLENOL) tablet 1,000 mg (1,000 mg Oral Given 11/02/20 0049)  iohexol (OMNIPAQUE) 350  MG/ML injection 100 mL (100 mLs Intravenous Contrast Given 11/02/20 0326)     The patient appears reasonably screen and/or stabilized for discharge and I doubt any other medical condition or other [facility] requiring further screening, evaluation, or treatment in the ED at this time prior to discharge.     Deno Etienne, DO 11/02/20 0400

## 2020-11-02 NOTE — Discharge Instructions (Addendum)
Your CT scans were negative for acute issue.  Please follow up with your family doc.

## 2020-11-02 NOTE — ED Notes (Signed)
Patient arrived POV from [facility] for CTA Chest and CT ABD. No Complaints otherwise at this time.

## 2020-11-02 NOTE — ED Provider Notes (Signed)
Blood pressure 131/71, pulse 73, temperature 98.6 F (37 C), temperature source Oral, resp. rate 20, height '5\' 4"'$  (1.626 m), weight 130.2 kg, last menstrual period 10/07/2020, SpO2 97 %, unknown if currently breastfeeding.  Assuming care from Dr. Sherry Ruffing.  In short, Bridget [name] is a 45 y.o. female with a chief complaint of Chest Pain .  Refer to the original H&P for additional details.  The current plan of care is to follow up CT imaging.  01:20 AM  CT scanner at this facility is down currently.  I was made aware by the radiology department.  Patient signed out to me pending CT imaging.  She is overall well-appearing with normal vital signs.  I am concerned because I do not have a estimated time to repair.  I do not want to delay diagnoses in this case.  I have arranged for the patient to be seen in [place] with Dr. Tyrone Nine accepting. Will transfer. Discussed plan with patient who is in agreement.     Margette Fast, MD 11/02/20 0130

## 2020-11-02 NOTE — ED Notes (Signed)
Report given to the charge RN at [facility] ED - Steele Sizer, RN.

## 2020-11-02 NOTE — ED Notes (Signed)
Delay in getting the patient to CT at this time due to the CT scanner being down. MD aware.

## 2020-11-02 NOTE — ED Notes (Signed)
Attempted ultrasound IV insertion X 2 without success. Patient tolerated well.

## 2020-11-05 ENCOUNTER — Encounter: Payer: Self-pay | Admitting: Cardiovascular Disease

## 2020-11-05 NOTE — Progress Notes (Signed)
This encounter was created in error - please disregard.

## 2020-11-06 ENCOUNTER — Encounter: Payer: 59 | Admitting: Cardiovascular Disease

## 2020-11-15 ENCOUNTER — Encounter: Payer: Self-pay | Admitting: Internal Medicine

## 2020-11-15 ENCOUNTER — Telehealth (INDEPENDENT_AMBULATORY_CARE_PROVIDER_SITE_OTHER): Payer: 59 | Admitting: Internal Medicine

## 2020-11-15 DIAGNOSIS — R0789 Other chest pain: Secondary | ICD-10-CM | POA: Diagnosis not present

## 2020-11-15 DIAGNOSIS — F32A Depression, unspecified: Secondary | ICD-10-CM

## 2020-11-15 DIAGNOSIS — J0100 Acute maxillary sinusitis, unspecified: Secondary | ICD-10-CM

## 2020-11-15 DIAGNOSIS — J019 Acute sinusitis, unspecified: Secondary | ICD-10-CM | POA: Insufficient documentation

## 2020-11-15 DIAGNOSIS — Z86711 Personal history of pulmonary embolism: Secondary | ICD-10-CM

## 2020-11-15 DIAGNOSIS — F419 Anxiety disorder, unspecified: Secondary | ICD-10-CM

## 2020-11-15 MED ORDER — CEFDINIR 300 MG PO CAPS: 300.0000 mg | ORAL_CAPSULE | Freq: Two times a day (BID) | ORAL | 0 refills | Status: DC

## 2020-11-15 NOTE — Progress Notes (Signed)
Virtual Visit via Video Note  I connected with Bridget [name] on 11/15/20 at  2:40 PM EDT by a video enabled telemedicine application and verified that I am speaking with the correct person using two identifiers.   I discussed the limitations of evaluation and management by telemedicine and the availability of in person appointments. The patient expressed understanding and agreed to proceed.  I was located at our [facility] office. The patient was at home. There was no one else present in the visit.   History of Present Illness: We need to follow-up on ER visit on 8/17 - abd pain, CP  C/o congestion, some cough, chest pain, mild shortness of breath, abdominal pain, yellow nasal d/c x 1 week H/o PE - on Xarelto C/o GERD sx's sometimes  Observations/Objective: The patient appears to be in no acute distress, looks well.  Assessment and Plan:  See my Assessment and Plan. Follow Up Instructions:    I discussed the assessment and treatment plan with the patient. The patient was provided an opportunity to ask questions and all were answered. The patient agreed with the plan and demonstrated an understanding of the instructions.   The patient was advised to call back or seek an in-person evaluation if the symptoms worsen or if the condition fails to improve as anticipated.  I provided face-to-face time during this encounter. We were at different locations.   Walker Kehr, MD

## 2020-11-15 NOTE — Assessment & Plan Note (Signed)
Continue on Xarelto. 

## 2020-11-15 NOTE — Assessment & Plan Note (Addendum)
Discussed Husband Octavia Bruckner  is coming home soon hopefully.

## 2020-11-15 NOTE — Assessment & Plan Note (Signed)
New. Will start on Omnicef x 10 d

## 2020-11-15 NOTE — Assessment & Plan Note (Signed)
Will treat sinusitis

## 2020-11-22 ENCOUNTER — Telehealth: Payer: Self-pay

## 2020-11-22 NOTE — Telephone Encounter (Signed)
MD rx Omnicef 300 mg 1 twice a day. Pls advise in absence of PCP.Marland KitchenJohny Chess

## 2020-11-22 NOTE — Telephone Encounter (Signed)
Please advise as the pt has stated she feels as though the medication that was rx'd to her on  11/15/2020 is not working and she says she stills feels the same as her day of the VV with Dr. Alain Marion. Pt is wanting something else in place of the medication given.  **Pt states it feels as though her kidney's are hurting. She is no longer coughing up the mucus. However, she is still having drainage.  Please contact pt with further instructions on next steps.  Medication: cefdinir (OMNICEF) 300 MG capsule

## 2020-11-22 NOTE — Telephone Encounter (Signed)
Notified pt w/Md response. Made f/u appt w/Dr. Sharlet Salina for Friday 9:40.Marland KitchenJohny Chess

## 2020-11-22 NOTE — Telephone Encounter (Signed)
The antibiotic she was prescribed should treat her sinus infection.  Since she is having new symptoms-kidneys are hurting and she really needs to be reevaluated.  I would recommend a visit.

## 2020-11-23 ENCOUNTER — Other Ambulatory Visit: Payer: Self-pay

## 2020-11-24 ENCOUNTER — Ambulatory Visit: Payer: 59 | Admitting: Internal Medicine

## 2020-11-29 ENCOUNTER — Other Ambulatory Visit: Payer: Self-pay

## 2020-11-30 ENCOUNTER — Encounter: Payer: Self-pay | Admitting: Internal Medicine

## 2020-11-30 ENCOUNTER — Telehealth: Payer: Self-pay | Admitting: Internal Medicine

## 2020-11-30 ENCOUNTER — Ambulatory Visit (INDEPENDENT_AMBULATORY_CARE_PROVIDER_SITE_OTHER): Payer: 59 | Admitting: Internal Medicine

## 2020-11-30 VITALS — BP 128/78 | HR 86 | Temp 98.4°F | Resp 18 | Ht 64.0 in | Wt 288.2 lb

## 2020-11-30 DIAGNOSIS — R109 Unspecified abdominal pain: Secondary | ICD-10-CM

## 2020-11-30 NOTE — Progress Notes (Signed)
   Subjective:   Patient ID: Bridget [name], female    DOB: 06-05-1975, 45 y.o.   MRN: [id number]  HPI The patient is a 45 YO female coming in for bilateral thoracic back pain/flank pain. Seen at ER for this several weeks ago and then followed up with PCP and given antibiotics for sinus infection. She is now done with this and does not feel it helped. Some flank pain still. Variable based on the day. Still severe when present. Maybe worse with prolonged sitting or standing.   Review of Systems  Constitutional: Negative.   HENT: Negative.    Eyes: Negative.   Respiratory:  Negative for cough, chest tightness and shortness of breath.   Cardiovascular:  Negative for chest pain, palpitations and leg swelling.  Gastrointestinal:  Negative for abdominal distention, abdominal pain, constipation, diarrhea, nausea and vomiting.  Genitourinary:  Positive for flank pain.  Musculoskeletal:  Positive for arthralgias and back pain.  Skin: Negative.   Neurological: Negative.   Psychiatric/Behavioral: Negative.     Objective:  Physical Exam Constitutional:      Appearance: She is well-developed. She is obese.  HENT:     Head: Normocephalic and atraumatic.  Cardiovascular:     Rate and Rhythm: Normal rate and regular rhythm.  Pulmonary:     Effort: Pulmonary effort is normal. No respiratory distress.     Breath sounds: Normal breath sounds. No wheezing or rales.  Abdominal:     General: Bowel sounds are normal. There is no distension.     Palpations: Abdomen is soft.     Tenderness: There is no abdominal tenderness. There is no rebound.  Musculoskeletal:        General: Tenderness present.     Cervical back: Normal range of motion.     Comments: Pain thoracic region paraspinal bilateral, left more than right  Skin:    General: Skin is warm and dry.  Neurological:     Mental Status: She is alert and oriented to person, place, and time.     Coordination: Coordination normal.    Vitals:    11/30/20 0856  BP: 128/78  Pulse: 86  Resp: 18  Temp: 98.4 F (36.9 C)  TempSrc: Oral  SpO2: 96%  Weight: 288 lb 3.2 oz (130.7 kg)  Height: '5\' 4"'$  (1.626 m)    This visit occurred during the SARS-CoV-2 public health emergency.  Safety protocols were in place, including screening questions prior to the visit, additional usage of staff PPE, and extensive cleaning of exam room while observing appropriate contact time as indicated for disinfecting solutions.   Assessment & Plan:  Visit time 20 minutes in face to face communication with patient and coordination of care, additional 10 minutes spent in record review, coordination or care, ordering tests, communicating/referring to other healthcare professionals, documenting in medical records all on the same day of the visit for total time 30 minutes spent on the visit.

## 2020-11-30 NOTE — Telephone Encounter (Signed)
Patient came in to see provider regarding having shortness of breath 08.31.22  Provider sent antibiotic Cefdinir to pharmacy  Patient says she has finished the antibiotic & is not feeling any better.. still having the shortness of breath  Wants to know if something else can be sent over to pharmacy or what other suggestions provider has  Patient also sent provider message via MyChart

## 2020-12-01 DIAGNOSIS — R109 Unspecified abdominal pain: Secondary | ICD-10-CM | POA: Insufficient documentation

## 2020-12-01 NOTE — Assessment & Plan Note (Signed)
The ER did not check U/A during visit to assess for blood in urine or infection. There was a small non-obstructing kidney stone on the left which was stable and present about 1 year ago. She could not urinate during visit so ordered U/A and culture for her to return sample. We did discuss in depth the results of CT scan and that her marked scoliosis could be responsible for some of the pain and in general pain with prolonged sitting or standing can be coming from back and spine problems.

## 2020-12-01 NOTE — Telephone Encounter (Signed)
Go to ER if severe shortness of breath. Otherwise please see me or another provider in the office. Thanks

## 2020-12-01 NOTE — Telephone Encounter (Signed)
Duplicate msg responded back via mychart w/MD response.Marland KitchenJohny [name]

## 2020-12-02 ENCOUNTER — Emergency Department (HOSPITAL_BASED_OUTPATIENT_CLINIC_OR_DEPARTMENT_OTHER): Payer: 59 | Admitting: Radiology

## 2020-12-02 ENCOUNTER — Emergency Department (HOSPITAL_BASED_OUTPATIENT_CLINIC_OR_DEPARTMENT_OTHER)
Admission: EM | Admit: 2020-12-02 | Discharge: 2020-12-03 | Disposition: A | Discharge status: Home / Self Care | Payer: 59 | Attending: Emergency Medicine | Admitting: Emergency Medicine

## 2020-12-02 ENCOUNTER — Encounter (HOSPITAL_BASED_OUTPATIENT_CLINIC_OR_DEPARTMENT_OTHER): Payer: Self-pay

## 2020-12-02 ENCOUNTER — Other Ambulatory Visit: Payer: Self-pay

## 2020-12-02 DIAGNOSIS — M546 Pain in thoracic spine: Secondary | ICD-10-CM | POA: Diagnosis present

## 2020-12-02 DIAGNOSIS — M62830 Muscle spasm of back: Secondary | ICD-10-CM | POA: Insufficient documentation

## 2020-12-02 DIAGNOSIS — E669 Obesity, unspecified: Secondary | ICD-10-CM | POA: Diagnosis not present

## 2020-12-02 DIAGNOSIS — I10 Essential (primary) hypertension: Secondary | ICD-10-CM | POA: Insufficient documentation

## 2020-12-02 DIAGNOSIS — R0602 Shortness of breath: Secondary | ICD-10-CM | POA: Diagnosis not present

## 2020-12-02 DIAGNOSIS — Z7901 Long term (current) use of anticoagulants: Secondary | ICD-10-CM | POA: Diagnosis not present

## 2020-12-02 LAB — CBC WITH DIFFERENTIAL/PLATELET
Abs Immature Granulocytes: 0.01 10*3/uL (ref 0.00–0.07)
Basophils Absolute: 0.1 10*3/uL (ref 0.0–0.1)
Basophils Relative: 1 %
Eosinophils Absolute: 0.2 10*3/uL (ref 0.0–0.5)
Eosinophils Relative: 3 %
HCT: 36.7 % (ref 36.0–46.0)
Hemoglobin: 12.5 g/dL (ref 12.0–15.0)
Immature Granulocytes: 0 %
Lymphocytes Relative: 36 %
Lymphs Abs: 2.6 10*3/uL (ref 0.7–4.0)
MCH: 32.5 pg (ref 26.0–34.0)
MCHC: 34.1 g/dL (ref 30.0–36.0)
MCV: 95.3 fL (ref 80.0–100.0)
Monocytes Absolute: 0.6 10*3/uL (ref 0.1–1.0)
Monocytes Relative: 9 %
Neutro Abs: 3.6 10*3/uL (ref 1.7–7.7)
Neutrophils Relative %: 51 %
Platelets: 325 10*3/uL (ref 150–400)
RBC: 3.85 MIL/uL — ABNORMAL LOW (ref 3.87–5.11)
RDW: 12.6 % (ref 11.5–15.5)
WBC: 7.1 10*3/uL (ref 4.0–10.5)
nRBC: 0 % (ref 0.0–0.2)

## 2020-12-02 LAB — URINALYSIS, ROUTINE W REFLEX MICROSCOPIC
Bilirubin Urine: NEGATIVE
Glucose, UA: NEGATIVE mg/dL
Ketones, ur: NEGATIVE mg/dL
Nitrite: NEGATIVE
Protein, ur: 30 mg/dL — AB
Specific Gravity, Urine: 1.025 (ref 1.005–1.030)
pH: 6 (ref 5.0–8.0)

## 2020-12-02 LAB — URINALYSIS, MICROSCOPIC (REFLEX): RBC / HPF: >50 RBC/hpf (ref 0–5)

## 2020-12-02 LAB — BASIC METABOLIC PANEL
Anion gap: 9 (ref 5–15)
BUN: 12 mg/dL (ref 6–20)
CO2: 25 mmol/L (ref 22–32)
Calcium: 9.3 mg/dL (ref 8.9–10.3)
Chloride: 104 mmol/L (ref 98–111)
Creatinine, Ser: 0.72 mg/dL (ref 0.44–1.00)
GFR, Estimated: >60 mL/min (ref >60)
Glucose, Bld: 113 mg/dL — ABNORMAL HIGH (ref 70–99)
Potassium: 3.9 mmol/L (ref 3.5–5.1)
Sodium: 138 mmol/L (ref 135–145)

## 2020-12-02 LAB — PREGNANCY, URINE: Preg Test, Ur: NEGATIVE

## 2020-12-02 NOTE — ED Triage Notes (Addendum)
Pt came in for multiple complaints. - states she has had SOB and kidney pain  for a wk.  States she was dx sinusitis and had her abx course done but it feels the same.    Also states she feels like she is about to pass out.

## 2020-12-03 MED ORDER — ALBUTEROL SULFATE HFA 108 (90 BASE) MCG/ACT IN AERS
2.0000 | INHALATION_SPRAY | Freq: Once | RESPIRATORY_TRACT | Status: AC
  Administered 2020-12-03: 2 via RESPIRATORY_TRACT
  Filled 2020-12-03: qty 6.7

## 2020-12-03 MED ORDER — AEROCHAMBER PLUS FLO-VU MEDIUM MISC
1.0000 | Freq: Once | Status: AC
  Administered 2020-12-03: 1
  Filled 2020-12-03: qty 1

## 2020-12-03 NOTE — ED Provider Notes (Signed)
[facility] EMERGENCY DEPT Provider Note  CSN: [id number] Arrival date & time: 12/02/20 2034  Chief Complaint(s) multiple complaints   HPI Bridget [name] is a 45 y.o. female with a past medical history listed below who presents to the emergency department with various complaints.  2 weeks bilateral mid back pain.  Gradual onset aching.  Constant since onset.  Alleviated mildly by over-the-counter medication.  Worse with palpation.  Denies any falls or trauma.  No difficulty ambulating, lower extremity weakness or loss of sensation.  She denies any urinary symptoms.  Seen by her PCPs office who ordered UA.  She has not been able to make it back to the office to provide the urine sample.    Shortness of breath described as not being able to take in a full deep breath.  She reports that she was recently treated for sinus infection and completed her antibiotic course.  Since then she has been having the shortness of breath.  There is no alleviating or aggravating factors.  Tonight while driving she felt the shortness of breath was more severe prompting her visit.  She denied any associated chest pain.  No pleurisy.  No overt coughing.  No nausea or vomiting.  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  Denies missing any doses.  Reports that this does not feel like her prior PE.  HPI  Past Medical History Past Medical History:  Diagnosis Date   Constipation    Edema    Headache(784.0)    Morbid obesity ([place])    Pulmonary embolism ([place])    Patient Active Problem List   Diagnosis Date Noted   Flank pain 12/01/2020   Acute sinusitis 11/15/2020   Perforated diverticulum 12/31/2019   GERD (gastroesophageal reflux disease) 09/29/2019   Constipation 09/29/2019   Foot pain, right 08/04/2019   BPPV (benign paroxysmal positional vertigo) 12/04/2018   Chest discomfort 09/16/2018   History of pulmonary embolus (PE) 07/22/2018   Anxiety and depression  07/13/2018   Arm swelling 07/06/2018   DOE (dyspnea on exertion) 06/30/2018   HTN (hypertension) 10/29/2017   Reactive depression (situational) 10/29/2017   Pruritic condition 09/25/2016   Urticaria 09/25/2016   Anemia 04/23/2016   Well adult exam 05/06/2014   Otitis media 12/13/2011   Fatigue 12/13/2011   B12 deficiency 12/13/2011   Vitamin D deficiency 12/13/2011   Palpitations 08/06/2010   Syncope and collapse 08/06/2010   EDEMA 10/14/2007   UTI 07/21/2007   LOW BACK PAIN 07/21/2007   ELEVATED BP 07/21/2007   Morbid (severe) obesity due to excess calories ([place]) 11/15/2006   Migraines 11/15/2006   Home Medication(s) Prior to Admission medications   Medication Sig Start Date End Date Taking? Authorizing Provider  acetaminophen (TYLENOL) 500 MG tablet Take 1,000 mg by mouth every 6 (six) hours as needed for mild pain.    [provider]  albuterol (VENTOLIN HFA) 108 (90 Base) MCG/ACT inhaler Inhale 1-2 puffs into the lungs every 6 (six) hours as needed for wheezing or shortness of breath. 06/21/20   Marrian Salvage, FNP  cetirizine (ZYRTEC) 10 MG tablet Take 10 mg by mouth daily as needed for allergies.    [provider]  Cholecalciferol (VITAMIN D3) 125 MCG (5000 UT) CAPS Take 1 capsule by mouth daily.    [provider]  clonazePAM (KLONOPIN) 0.5 MG disintegrating tablet Take 1-2 tablets (0.5-1 mg total) by mouth 2 (two) times daily as needed (panic attacks). 09/29/19   Plotnikov,  Evie Lacks, MD  Cyanocobalamin (VITAMIN B-12) 1000 MCG SUBL Place 1 tablet (1,000 mcg total) under the tongue daily. 08/04/19   Plotnikov, Evie Lacks, MD  Multiple Vitamin (MULTIVITAMIN) capsule Take 1 capsule by mouth daily.    [provider]  ondansetron (ZOFRAN) 4 MG tablet Take 1 tablet (4 mg total) by mouth every 8 (eight) hours as needed for nausea or vomiting. 10/10/20   Plotnikov, Evie Lacks, MD  pantoprazole (PROTONIX) 40 MG tablet Take 1 tablet (40 mg total)  by mouth daily. 11/06/20   Plotnikov, Evie Lacks, MD  potassium chloride SA (KLOR-CON) 20 MEQ tablet Take 1 tablet (20 mEq total) by mouth 2 (two) times daily. Must keep scheduled appt for future refills need potassium check 10/10/20   Plotnikov, Evie Lacks, MD  rizatriptan (MAXALT) 10 MG tablet Take 1 tablet (10 mg total) by mouth once as needed for up to 1 dose for migraine. May repeat in 2 hours if needed 10/10/20   Plotnikov, Evie Lacks, MD  triamterene-hydrochlorothiazide (MAXZIDE-25) 37.5-25 MG tablet Take 1 tablet by mouth daily. 10/10/20   Plotnikov, Evie Lacks, MD  XARELTO 20 MG TABS tablet TAKE 1 TABLET BY MOUTH ONCE DAILY WITH SUPPER 08/14/20   Plotnikov, Evie Lacks, MD                                                                                                                                    Past Surgical History Past Surgical History:  Procedure Laterality Date   BREAST REDUCTION SURGERY     BREAST SURGERY  2005   reduction   CESAREAN SECTION N/A 03/20/2016   Procedure: CESAREAN SECTION;  Surgeon: Christophe Louis, MD;  Location: [facility];  Service: Obstetrics;  Laterality: N/A;   COLONOSCOPY WITH PROPOFOL N/A 05/11/2020   Procedure: COLONOSCOPY WITH PROPOFOL;  Surgeon: [name], MD;  Location: [facility];  Service: Endoscopy;  Laterality: N/A;   POLYPECTOMY  05/11/2020   Procedure: POLYPECTOMY;  Surgeon: [name], MD;  Location: [facility];  Service: Endoscopy;;   TUBAL LIGATION Bilateral 03/20/2016   Procedure: BILATERAL TUBAL LIGATION;  Surgeon: Christophe Louis, MD;  Location: [facility];  Service: Obstetrics;  Laterality: Bilateral;   Family History Family History  Problem Relation Age of Onset   Deep vein thrombosis Father    Diabetes Father    Hypertension Mother    Hypertension Other    Diabetes Paternal Aunt    Heart disease Maternal Grandmother    Heart disease Maternal Grandfather    Diabetes Paternal Grandmother     Social History Social  History   Tobacco Use   Smoking status: Never   Smokeless tobacco: Never  Vaping Use   Vaping Use: Never used  Substance Use Topics   Alcohol use: Not Currently   Drug use: No   Allergies Patient has no known allergies.  Review of Systems Review of Systems All other  systems are reviewed and are negative for acute change except as noted in the HPI  Physical Exam Vital Signs  I have reviewed the triage vital signs BP 116/85   Pulse 67   Temp 97.7 F (36.5 C) (Oral)   Resp 20   Ht '5\' 4"'$  (1.626 m)   Wt 130 kg   LMP 12/01/2020   SpO2 98%   BMI 49.19 kg/m   Physical Exam Vitals reviewed.  Constitutional:      General: She is not in acute distress.    Appearance: She is well-developed. She is obese. She is not diaphoretic.  HENT:     Head: Normocephalic and atraumatic.     Nose: Nose normal.  Eyes:     General: No scleral icterus.       Right eye: No discharge.        Left eye: No discharge.     Conjunctiva/sclera: Conjunctivae normal.     Pupils: Pupils are equal, round, and reactive to light.  Cardiovascular:     Rate and Rhythm: Normal rate and regular rhythm.     Heart sounds: No murmur heard.   No friction rub. No gallop.  Pulmonary:     Effort: Pulmonary effort is normal. No respiratory distress.     Breath sounds: Normal breath sounds. No stridor. No rales.  Abdominal:     General: There is no distension.     Palpations: Abdomen is soft.     Tenderness: There is no abdominal tenderness.  Musculoskeletal:     Cervical back: Normal range of motion and neck supple.     Thoracic back: Spasms and tenderness present. Scoliosis present.       Back:  Skin:    General: Skin is warm and dry.     Findings: No erythema or rash.  Neurological:     Mental Status: She is alert and oriented to person, place, and time.    ED Results and Treatments Labs (all labs ordered are listed, but only abnormal results are displayed) Labs Reviewed  BASIC METABOLIC PANEL -  Abnormal; Notable for the following components:      Result Value   Glucose, Bld 113 (*)    All other components within normal limits  CBC WITH DIFFERENTIAL/PLATELET - Abnormal; Notable for the following components:   RBC 3.85 (*)    All other components within normal limits  URINALYSIS, ROUTINE W REFLEX MICROSCOPIC - Abnormal; Notable for the following components:   Color, Urine ORANGE (*)    APPearance CLOUDY (*)    Hgb urine dipstick LARGE (*)    Protein, ur 30 (*)    Leukocytes,Ua MODERATE (*)    All other components within normal limits  URINALYSIS, MICROSCOPIC (REFLEX) - Abnormal; Notable for the following components:   Bacteria, UA FEW (*)    All other components within normal limits  PREGNANCY, URINE  EKG  EKG Interpretation  Date/Time:  Saturday December 02 2020 20:48:51 EDT Ventricular Rate:  79 PR Interval:  150 QRS Duration: 72 QT Interval:  336 QTC Calculation: 385 R Axis:   37 Text Interpretation: Normal sinus rhythm No significant change since last tracing Confirmed by Addison Lank [phone]) on 12/03/2020 1:04:02 AM       Radiology DG Chest 2 View  Result Date: 12/02/2020 CLINICAL DATA:  Shortness of breath for 1 week. EXAM: CHEST - 2 VIEW COMPARISON:  11/01/2020 FINDINGS: Normal heart size and pulmonary vascularity. No focal airspace disease or consolidation in the lungs. No blunting of costophrenic angles. No pneumothorax. Mediastinal contours appear intact. Thoracic scoliosis convex towards the right. Postoperative changes in the left upper quadrant abdomen. IMPRESSION: No active cardiopulmonary disease. Electronically Signed   By: Lucienne Capers M.D.   On: 12/02/2020 21:53    Pertinent labs & imaging results that were available during my care of the patient were reviewed by me and considered in my medical decision making (see MDM for  details).  Medications Ordered in ED Medications  albuterol (VENTOLIN HFA) 108 (90 Base) MCG/ACT inhaler 2 puff (2 puffs Inhalation Given 12/03/20 0138)  AeroChamber Plus Flo-Vu Medium MISC 1 each (1 each Other Given 12/03/20 0138)                                                                                                                                     Procedures Procedures  (including critical care time)  Medical Decision Making / ED Course I have reviewed the nursing notes for this encounter and the patient's prior records (if available in EHR or on provided paperwork).  EFFIE DIBLASIO was evaluated in Emergency Department on 12/03/2020 for the symptoms described in the history of present illness. She was evaluated in the context of the global COVID-19 pandemic, which necessitated consideration that the patient might be at risk for infection with the SARS-CoV-2 virus that causes COVID-19. Institutional protocols and algorithms that pertain to the evaluation of patients at risk for COVID-19 are in a state of rapid change based on information released by regulatory bodies including the CDC and federal and state organizations. These policies and algorithms were followed during the patient's care in the ED.     Mid back pain. Tender to palpation over spastic musculature.  Denies any falls or trauma.  No urinary symptoms.  UA contaminated with blood from menstruation.  Not overtly concerning for infection.  Labs grossly reassuring without leukocytosis or anemia.  No significant electrolyte derangements or renal insufficiency.  Feel this is likely muscular in nature.   Shortness of breath.  Patient is well-appearing.  In no respiratory distress.  Lungs are clear to auscultation bilaterally.  EKG without acute ischemic changes, dysrhythmias or blocks.  CBC without anemia.  Chest x-ray without evidence of pneumonia, pneumothorax or pulmonary edema.  Low suspicion for pulmonary  embolism. Symptoms  resolved with 2 puffs of albuterol inhaler. Likely bronchitis/bronchospasm from sequela of recent respiratory infection. Pertinent labs & imaging results that were available during my care of the patient were reviewed by me and considered in my medical decision making:    Final Clinical Impression(s) / ED Diagnoses Final diagnoses:  Acute bilateral thoracic back pain  SOB (shortness of breath)   The patient appears reasonably screened and/or stabilized for discharge and I doubt any other medical condition or other [facility] requiring further screening, evaluation, or treatment in the ED at this time prior to discharge. Safe for discharge with strict return precautions.  Disposition: Discharge  Condition: Good  I have discussed the results, Dx and Tx plan with the patient/family who expressed understanding and agree(s) with the plan. Discharge instructions discussed at length. The patient/family was given strict return precautions who verbalized understanding of the instructions. No further questions at time of discharge.    ED Discharge Orders     None        Follow Up: Plotnikov, Evie Lacks, MD [place] [address] [phone]  Call  as needed, if symptoms do not improve or  worsen     This chart was dictated using voice recognition software.  Despite best efforts to proofread,  errors can occur which can change the documentation meaning.    Fatima Blank, MD 12/03/20 [phone]

## 2020-12-03 NOTE — ED Notes (Signed)
RT educated pt on proper use of MDI w/spacer. Pt able to perform w/out difficulty and demonstrate proper use. Pt able to teach back to RT.

## 2020-12-03 NOTE — Discharge Instructions (Signed)
You may use over-the-counter Motrin (Ibuprofen), Acetaminophen (Tylenol), topical muscle creams such as SalonPas, Icy Hot, Bengay, etc. Please stretch, apply ice or heat (whichever helps), and have massage therapy for additional assistance.  

## 2020-12-07 ENCOUNTER — Encounter: Payer: Self-pay | Admitting: Adult Health

## 2020-12-07 ENCOUNTER — Telehealth (INDEPENDENT_AMBULATORY_CARE_PROVIDER_SITE_OTHER): Payer: 59 | Admitting: Adult Health

## 2020-12-07 ENCOUNTER — Telehealth: Payer: Self-pay | Admitting: Internal Medicine

## 2020-12-07 VITALS — HR 79 | Temp 96.7°F | Ht 64.0 in | Wt 286.0 lb

## 2020-12-07 DIAGNOSIS — J40 Bronchitis, not specified as acute or chronic: Secondary | ICD-10-CM | POA: Diagnosis not present

## 2020-12-07 MED ORDER — DOXYCYCLINE HYCLATE 100 MG PO CAPS: 100.0000 mg | ORAL_CAPSULE | Freq: Two times a day (BID) | ORAL | 0 refills | Status: DC

## 2020-12-07 MED ORDER — PREDNISONE 10 MG PO TABS: ORAL_TABLET | ORAL | 0 refills | Status: DC

## 2020-12-07 NOTE — Progress Notes (Signed)
Virtual Visit via Telephone Note  I connected with Bridget [name] on 12/07/20 at 11:00 AM EDT by telephone and verified that I am speaking with the correct person using two identifiers.   I discussed the limitations, risks, security and privacy concerns of performing an evaluation and management service by telephone and the availability of in person appointments. I also discussed with the patient that there may be a patient responsible charge related to this service. The patient expressed understanding and agreed to proceed.  Location patient: home Location provider: work or home office Participants present for the call: patient, provider Patient did not have a visit in the prior 7 days to address this/these issue(s).   History of Present Illness: 45 year old female, patient of Dr. Alain Marion who is being seen today for continued SOB and PND.   She was originally seen on 11/15/2020 her PCP with a complaint of congestion, mild cough, chest pain, mild shortness of breath, abdominal pain, and yellow nasal drainage x1 week.  She does have a history of PE on Xarelto.  He was started on Omnicef x10 days for sinusitis., took the entire course but was not feeling any better.  She was then seen in the emergency room 12/02/2020 with 2 weeks of bilateral mid back pain, gradual onset aching.  It was alleviated mildly by over-the-counter medications.  Worse with palpation.  She denied any falls or trauma.  No difficult the ambulating, no lower extremity weakness or loss of sensation.  In addition to the bilateral mid back pain she was continuing to have shortness of breath unable to take a full breath.  At this time she denied associated chest pain, pleurisy, or overt coughing.  Your exam and work-up showed tenderness to palpation over 6 spastic musculature.  Was not complaining of any urinary symptoms, her UA was contaminated with blood from menstruation but no concern for infection.  Labs were assuring  without leukocytosis or anemia.  Back pain was thought to be more muscular in nature.  For this shortness of breath her lungs were clear to auscultation bilaterally.  EKG without acute ischemic changes.  CBC without anemia.  Chest x-ray did not show evidence of pneumonia, pneumothorax, or pulmonary edema.  There was low suspicion for pulmonary embolism.  Her symptoms resolved with 2 puffs of an albuterol inhaler and it was thought was likely bronchospasm from recent respiratory infection or bronchitis.  Today she reports that she continues to have shortness of breath and feels as though she is not able to take a full breath.  She does not feel as though she is wheezing and has no cough.  She has a tight sensation in her upper chest with some mild discomfort in her upper back.  She has been using an albuterol inhaler but reports that it only works for about 30 minutes and it causes her heart to race so she has not been using this consistently.  Is also tried Mucinex without relief.  She also has postnasal drip that is discolored.  No sinus pain or pressure.  Has not experienced any fevers or chills, chest pain, or swelling in her lower extremities.   Observations/Objective: Patient sounds cheerful and well on the phone. I do not appreciate any SOB. Speech and thought processing are grossly intact. Patient reported vitals:  Assessment and Plan: 1. Bronchitis -Doubt PE or pneumonia.  Will treat for bronchitis with prednisone and cover for bacterial upper respiratory infection with doxycycline.  Advise follow-up as needed -  predniSONE (DELTASONE) 10 MG tablet; 40 mg x 3 days, 20 mg x 3 days, 10 mg x 3 days  Dispense: 21 tablet; Refill: 0 - doxycycline (VIBRAMYCIN) 100 MG capsule; Take 1 capsule (100 mg total) by mouth 2 (two) times daily.  Dispense: 14 capsule; Refill: 0   Follow Up Instructions:   I did not refer this patient for an OV in the next 24 hours for this/these issue(s).  I discussed  the assessment and treatment plan with the patient. The patient was provided an opportunity to ask questions and all were answered. The patient agreed with the plan and demonstrated an understanding of the instructions.   The patient was advised to call back or seek an in-person evaluation if the symptoms worsen or if the condition fails to improve as anticipated.  I provided 20  minutes of non-face-to-face time during this encounter.   Dorothyann Peng, NP

## 2020-12-07 NOTE — Telephone Encounter (Signed)
   Patient calling to report  SOB, pain in upper back, ?drainage    Call transferred to [facility]

## 2020-12-08 NOTE — Telephone Encounter (Signed)
[facility] FYI...   Caller states she is experiencing shortness of breath. She has had a lingering cold that her PCP treated her for with Cefdinir 2 weeks ago. She still has drainage in the back of her throat. She left her PCP a message on MyChart. He did said if her SOB was severe, to go to the ER. Over the weekend, it was so bad that she thought she was going to pass out, so she did. They said her CXR, O2 sat & lungs were good. They gave her a pump to put on her Albuterol inhaler. She is taking it 5-6 times a day. It makes her heart race after using it. It will help for 30 minutes only. She is really irritated, feeling like no one is listening to her. She thinks the Cefdinir did not help at all. It felt like after she took it, she felt the same way, like she hadn't even had anything. Her upper back aches all day. She is still taking OTC meds & Mucinex. She cannot get a deep breath. Today, she is spitting out phlegm. It is thick white. It was yellow at one point. She is calling to make an appt. because the ER doctor told her to follow up with her PCP.   Since patient stated that she is not worse since she was seen in the ER this past weekend, did not triage. Instructed that if I had triaged based on what she was telling me, that my outcome would be to go to the ER. She stated she was not going to do that because she was just there. She wants an appt with her PCP. Warm transferred her to [facility] to make an appt asap.

## 2020-12-10 ENCOUNTER — Other Ambulatory Visit: Payer: Self-pay | Admitting: Internal Medicine

## 2020-12-26 ENCOUNTER — Other Ambulatory Visit: Payer: Self-pay | Admitting: Physician Assistant

## 2020-12-26 DIAGNOSIS — K219 Gastro-esophageal reflux disease without esophagitis: Secondary | ICD-10-CM

## 2020-12-26 DIAGNOSIS — Z903 Acquired absence of stomach [part of]: Secondary | ICD-10-CM

## 2021-01-01 ENCOUNTER — Other Ambulatory Visit: Payer: Self-pay | Admitting: Physician Assistant

## 2021-01-01 ENCOUNTER — Ambulatory Visit
Admission: RE | Admit: 2021-01-01 | Discharge: 2021-01-01 | Disposition: A | Discharge status: Home / Self Care | Payer: 59 | Source: Ambulatory Visit | Attending: Physician Assistant | Admitting: Physician Assistant

## 2021-01-01 DIAGNOSIS — Z903 Acquired absence of stomach [part of]: Secondary | ICD-10-CM

## 2021-01-01 DIAGNOSIS — K219 Gastro-esophageal reflux disease without esophagitis: Secondary | ICD-10-CM

## 2021-02-19 ENCOUNTER — Other Ambulatory Visit: Payer: Self-pay

## 2021-02-19 ENCOUNTER — Emergency Department (HOSPITAL_BASED_OUTPATIENT_CLINIC_OR_DEPARTMENT_OTHER)
Admission: EM | Admit: 2021-02-19 | Discharge: 2021-02-20 | Disposition: A | Discharge status: Home / Self Care | Payer: 59 | Attending: Emergency Medicine | Admitting: Emergency Medicine

## 2021-02-19 ENCOUNTER — Emergency Department (HOSPITAL_BASED_OUTPATIENT_CLINIC_OR_DEPARTMENT_OTHER): Payer: 59

## 2021-02-19 ENCOUNTER — Encounter (HOSPITAL_BASED_OUTPATIENT_CLINIC_OR_DEPARTMENT_OTHER): Payer: Self-pay | Admitting: *Deleted

## 2021-02-19 DIAGNOSIS — R102 Pelvic and perineal pain: Secondary | ICD-10-CM | POA: Diagnosis not present

## 2021-02-19 DIAGNOSIS — I10 Essential (primary) hypertension: Secondary | ICD-10-CM | POA: Insufficient documentation

## 2021-02-19 DIAGNOSIS — N938 Other specified abnormal uterine and vaginal bleeding: Secondary | ICD-10-CM | POA: Diagnosis present

## 2021-02-19 DIAGNOSIS — D259 Leiomyoma of uterus, unspecified: Secondary | ICD-10-CM

## 2021-02-19 DIAGNOSIS — N939 Abnormal uterine and vaginal bleeding, unspecified: Secondary | ICD-10-CM

## 2021-02-19 LAB — CBC
HCT: 36 % (ref 36.0–46.0)
Hemoglobin: 11.9 g/dL — ABNORMAL LOW (ref 12.0–15.0)
MCH: 31.7 pg (ref 26.0–34.0)
MCHC: 33.1 g/dL (ref 30.0–36.0)
MCV: 96 fL (ref 80.0–100.0)
Platelets: 329 10*3/uL (ref 150–400)
RBC: 3.75 MIL/uL — ABNORMAL LOW (ref 3.87–5.11)
RDW: 12 % (ref 11.5–15.5)
WBC: 7 10*3/uL (ref 4.0–10.5)
nRBC: 0 % (ref 0.0–0.2)

## 2021-02-19 LAB — PREGNANCY, URINE: Preg Test, Ur: NEGATIVE

## 2021-02-19 NOTE — Discharge Instructions (Addendum)
It was our pleasure to provide your ER care today - we hope that you feel better.  Overall, your labs and imaging tests look good.   Your ultrasound shows small uterine fibroids.   Contact your doctor tomorrow in regards to plan with your blood thinner therapy - if they were planning on stopping anyway, now might be a reasonable time.   Also follow up with gynecologist in the next couple weeks - call office to move forward appointment.  Discuss possible med therapy with your doctor tomorrow.   Return to ER if worse, new symptoms, new or severe pain, fevers, faintness/dizziness, or other concern.

## 2021-02-19 NOTE — ED Notes (Signed)
ED Provider at bedside. 

## 2021-02-19 NOTE — ED Triage Notes (Addendum)
C/o vaginal bleeding x 2 weeks with lower pelvic pain

## 2021-02-19 NOTE — ED Provider Notes (Addendum)
[facility] EMERGENCY DEPARTMENT Provider Note   CSN: [id number] Arrival date & time: 02/19/21  1638     History Chief Complaint  Patient presents with   Vaginal Bleeding    Bridget [name] is a 45 y.o. female.  Patient c/o vaginal bleeding in the past two weeks. States was normal time for her period but is lasting long than normal (normal for her being 5-6 days). Amount of bleeding was similar to normal period, changes partially saturated pad q 3-4 hrs.  States also wants checked for ovarian cyst ?hx same. Hx small fibroids. No ocp use. Indicates has not been sexually active. No abn vaginal dscharge. Is on xarelto for remote hx PE - denies other abnormal bruising or bleeding - pt indicates recently her doctor has talked to her about possibility of stopping therapy. No faintness or dizziness. No dysuria or gu c/o. No fever or chills. Midl lower abd/pelvis area cramping, similar to prior period-type cramping/pain.   The history is provided by the patient and medical records.  Vaginal Bleeding Associated symptoms: no back pain, no dysuria, no fever and no vaginal discharge       Past Medical History:  Diagnosis Date   Constipation    Edema    Headache(784.0)    Morbid obesity ([place])    Pulmonary embolism [facility])     Patient Active Problem List   Diagnosis Date Noted   Flank pain 12/01/2020   Acute sinusitis 11/15/2020   Perforated diverticulum 12/31/2019   GERD (gastroesophageal reflux disease) 09/29/2019   Constipation 09/29/2019   Foot pain, right 08/04/2019   BPPV (benign paroxysmal positional vertigo) 12/04/2018   Chest discomfort 09/16/2018   History of pulmonary embolus (PE) 07/22/2018   Anxiety and depression 07/13/2018   Arm swelling 07/06/2018   DOE (dyspnea on exertion) 06/30/2018   HTN (hypertension) 10/29/2017   Reactive depression (situational) 10/29/2017   Pruritic condition 09/25/2016   Urticaria 09/25/2016   Anemia 04/23/2016   Well adult exam  05/06/2014   Otitis media 12/13/2011   Fatigue 12/13/2011   B12 deficiency 12/13/2011   Vitamin D deficiency 12/13/2011   Palpitations 08/06/2010   Syncope and collapse 08/06/2010   EDEMA 10/14/2007   UTI 07/21/2007   LOW BACK PAIN 07/21/2007   ELEVATED BP 07/21/2007   Morbid (severe) obesity due to excess calories ([place]) 11/15/2006   Migraines 11/15/2006    Past Surgical History:  Procedure Laterality Date   BREAST REDUCTION SURGERY     BREAST SURGERY  2005   reduction   CESAREAN SECTION N/A 03/20/2016   Procedure: CESAREAN SECTION;  Surgeon: Christophe Louis, MD;  Location: [place];  Service: Obstetrics;  Laterality: N/A;   COLONOSCOPY WITH PROPOFOL N/A 05/11/2020   Procedure: COLONOSCOPY WITH PROPOFOL;  Surgeon: [name], MD;  Location: [facility];  Service: Endoscopy;  Laterality: N/A;   POLYPECTOMY  05/11/2020   Procedure: POLYPECTOMY;  Surgeon: [name], MD;  Location: [facility];  Service: Endoscopy;;   TUBAL LIGATION Bilateral 03/20/2016   Procedure: BILATERAL TUBAL LIGATION;  Surgeon: Christophe Louis, MD;  Location: [facility];  Service: Obstetrics;  Laterality: Bilateral;     OB History     Gravida  7   Para  2   Term  2   Preterm      AB  5   Living  2      SAB  3   IAB  2   Ectopic  Multiple  0   Live Births  2           Family History  Problem Relation Age of Onset   Deep vein thrombosis Father    Diabetes Father    Hypertension Mother    Hypertension Other    Diabetes Paternal Aunt    Heart disease Maternal Grandmother    Heart disease Maternal Grandfather    Diabetes Paternal Grandmother     Social History   Tobacco Use   Smoking status: Never   Smokeless tobacco: Never  Vaping Use   Vaping Use: Never used  Substance Use Topics   Alcohol use: Not Currently   Drug use: No    Home Medications Prior to Admission medications   Medication Sig Start Date End Date Taking? Authorizing Provider   acetaminophen (TYLENOL) 500 MG tablet Take 1,000 mg by mouth every 6 (six) hours as needed for mild pain.    [provider]  albuterol (VENTOLIN HFA) 108 (90 Base) MCG/ACT inhaler Inhale 1-2 puffs into the lungs every 6 (six) hours as needed for wheezing or shortness of breath. 06/21/20   Marrian Salvage, FNP  cetirizine (ZYRTEC) 10 MG tablet Take 10 mg by mouth daily as needed for allergies.    [provider]  Cholecalciferol (VITAMIN D3) 125 MCG (5000 UT) CAPS Take 1 capsule by mouth daily.    [provider]  clonazePAM (KLONOPIN) 0.5 MG disintegrating tablet Take 1-2 tablets (0.5-1 mg total) by mouth 2 (two) times daily as needed (panic attacks). 09/29/19   Plotnikov, Evie Lacks, MD  Cyanocobalamin (VITAMIN B-12) 1000 MCG SUBL Place 1 tablet (1,000 mcg total) under the tongue daily. 08/04/19   Plotnikov, Evie Lacks, MD  doxycycline (VIBRAMYCIN) 100 MG capsule Take 1 capsule (100 mg total) by mouth 2 (two) times daily. 12/07/20   Nafziger, Tommi Rumps, NP  Multiple Vitamin (MULTIVITAMIN) capsule Take 1 capsule by mouth daily.    [provider]  ondansetron (ZOFRAN) 4 MG tablet Take 1 tablet (4 mg total) by mouth every 8 (eight) hours as needed for nausea or vomiting. 10/10/20   Plotnikov, Evie Lacks, MD  pantoprazole (PROTONIX) 40 MG tablet Take 1 tablet (40 mg total) by mouth daily. 11/06/20   Plotnikov, Evie Lacks, MD  potassium chloride SA (KLOR-CON) 20 MEQ tablet Take 1 tablet (20 mEq total) by mouth 2 (two) times daily. Must keep scheduled appt for future refills need potassium check 10/10/20   Plotnikov, Evie Lacks, MD  predniSONE (DELTASONE) 10 MG tablet 40 mg x 3 days, 20 mg x 3 days, 10 mg x 3 days 12/07/20   Dorothyann Peng, NP  rivaroxaban (XARELTO) 20 MG TABS tablet TAKE 1 TABLET BY MOUTH ONCE DAILY WITH SUPPER 12/11/20   Plotnikov, Evie Lacks, MD  rizatriptan (MAXALT) 10 MG tablet Take 1 tablet (10 mg total) by mouth once as needed for up to 1 dose for migraine.  May repeat in 2 hours if needed 10/10/20   Plotnikov, Evie Lacks, MD  triamterene-hydrochlorothiazide (MAXZIDE-25) 37.5-25 MG tablet Take 1 tablet by mouth daily. 10/10/20   Plotnikov, Evie Lacks, MD    Allergies    Patient has no known allergies.  Review of Systems   Review of Systems  Constitutional:  Negative for chills and fever.  HENT:  Negative for nosebleeds.   Eyes:  Negative for redness.  Respiratory:  Negative for shortness of breath.   Cardiovascular:  Negative for chest pain.  Gastrointestinal:  Negative for blood in  stool, diarrhea and vomiting.  Genitourinary:  Positive for vaginal bleeding. Negative for dysuria, flank pain and vaginal discharge.  Musculoskeletal:  Negative for back pain.  Skin:  Negative for rash.  Neurological:  Negative for syncope and light-headedness.  Hematological:        On xarelto  Psychiatric/Behavioral:  Negative for confusion.    Physical Exam Updated Vital Signs BP 135/87   Pulse 70   Temp 97.8 F (36.6 C) (Oral)   Resp 18   Ht 1.626 m (5\' 4" )   Wt 129.7 kg   LMP 02/05/2021   SpO2 100%   BMI 49.09 kg/m   Physical Exam Vitals and nursing note reviewed.  Constitutional:      Appearance: Normal appearance. She is well-developed.  HENT:     Head: Atraumatic.     Nose: Nose normal.     Mouth/Throat:     Mouth: Mucous membranes are moist.  Eyes:     General: No scleral icterus.    Conjunctiva/sclera: Conjunctivae normal.  Neck:     Trachea: No tracheal deviation.  Cardiovascular:     Rate and Rhythm: Normal rate and regular rhythm.     Pulses: Normal pulses.     Heart sounds: Normal heart sounds. No murmur heard.   No friction rub. No gallop.  Pulmonary:     Effort: Pulmonary effort is normal. No respiratory distress.     Breath sounds: Normal breath sounds.  Abdominal:     General: Bowel sounds are normal. There is no distension.     Palpations: Abdomen is soft. There is no mass.     Tenderness: There is no abdominal  tenderness. There is no guarding or rebound.     Hernia: No hernia is present.     Comments: Obese.   Genitourinary:    Comments: No cva tenderness. Pelvic exam W RN. Scant blood in vaginal/dark blood. Cervix closed. No cmt. No adx masses or tenderness.  Musculoskeletal:        General: No swelling.     Cervical back: Normal range of motion and neck supple. No rigidity. No muscular tenderness.  Skin:    General: Skin is warm and dry.     Findings: No rash.  Neurological:     Mental Status: She is alert.     Comments: Alert, speech normal.   Psychiatric:        Mood and Affect: Mood normal.    ED Results / Procedures / Treatments   Labs (all labs ordered are listed, but only abnormal results are displayed) Results for orders placed or performed during the hospital encounter of 02/19/21  Pregnancy, urine  Result Value Ref Range   Preg Test, Ur NEGATIVE NEGATIVE  CBC  Result Value Ref Range   WBC 7.0 4.0 - 10.5 K/uL   RBC 3.75 (L) 3.87 - 5.11 MIL/uL   Hemoglobin 11.9 (L) 12.0 - 15.0 g/dL   HCT 36.0 36.0 - 46.0 %   MCV 96.0 80.0 - 100.0 fL   MCH 31.7 26.0 - 34.0 pg   MCHC 33.1 30.0 - 36.0 g/dL   RDW 12.0 11.5 - 15.5 %   Platelets 329 150 - 400 K/uL   nRBC 0.0 0.0 - 0.2 %   EKG None  Radiology US PELVIC COMPLETE WITH TRANSVAGINAL  Result Date: 02/19/2021 CLINICAL DATA:  Pelvic pain EXAM: TRANSABDOMINAL AND TRANSVAGINAL ULTRASOUND OF PELVIS TECHNIQUE: Both transabdominal and transvaginal ultrasound examinations of the pelvis were performed. Transabdominal technique was performed for  global imaging of the pelvis including uterus, ovaries, adnexal regions, and pelvic cul-de-sac. It was necessary to proceed with endovaginal exam following the transabdominal exam to visualize the uterus and endometrium. COMPARISON:  CT 11/02/2020 FINDINGS: Uterus Measurements: 7.7 by 4.7 x 6.5 cm = volume: 121.7 mL. Subtle heterogeneous mass in the anterior uterine corpus measuring 2 x 1.2 by 1.9  cm. Right uterine fundus mass measuring 1.6 by 1.3 by 1.6 cm. Endometrium Thickness: 5.6 mm.  No focal abnormality visualized. Right ovary Not visualized Left ovary Not visualized Other findings No abnormal free fluid. IMPRESSION: 1. Normal premenopausal endometrial thickness. 2. Small uterine fibroids 3. The ovaries are nonvisualized. Electronically Signed   By: Donavan Foil M.D.   On: 02/19/2021 23:08    Procedures Procedures   Medications Ordered in ED Medications - No data to display  ED Course  I have reviewed the triage vital signs and the nursing notes.  Pertinent labs & imaging results that were available during my care of the patient were reviewed by me and considered in my medical decision making (see chart for details).    MDM Rules/Calculators/A&P                          Labs sent. Pt requests imaging - ordered.   Reviewed nursing notes and prior charts for additional history.   Labs reviewed/interpreted by me - wbc and hct normal. Preg neg.   U/s reviewed/interpreted by me - small fibroids o/w neg acute.   Vitals are normal. No faintness or dizziness. No abd pain or tenderness. Scant blood in vagina on exam without active or brisk bleeding.   Patient currently appears stable for d/c.   Rec close pcp/gyn follow up.  Return precautions provided.       Final Clinical Impression(s) / ED Diagnoses Final diagnoses:  Pelvic pain    Rx / DC Orders ED Discharge Orders     None          Lajean Saver, MD 02/19/21 2319

## 2021-02-20 NOTE — ED Notes (Signed)
Pt verbalizes understanding of discharge instructions. Opportunity for questioning and answers were provided. Pt discharged from ED to home.   ? ?

## 2021-02-21 ENCOUNTER — Other Ambulatory Visit: Payer: Self-pay

## 2021-02-21 ENCOUNTER — Other Ambulatory Visit (HOSPITAL_COMMUNITY)
Admission: RE | Admit: 2021-02-21 | Discharge: 2021-02-21 | Disposition: A | Discharge status: Home / Self Care | Payer: 59 | Source: Ambulatory Visit | Attending: Obstetrics & Gynecology | Admitting: Obstetrics & Gynecology

## 2021-02-21 ENCOUNTER — Ambulatory Visit (INDEPENDENT_AMBULATORY_CARE_PROVIDER_SITE_OTHER): Payer: 59 | Admitting: Obstetrics & Gynecology

## 2021-02-21 VITALS — BP 136/76 | HR 75 | Wt 289.0 lb

## 2021-02-21 DIAGNOSIS — Z86711 Personal history of pulmonary embolism: Secondary | ICD-10-CM | POA: Diagnosis not present

## 2021-02-21 DIAGNOSIS — Z124 Encounter for screening for malignant neoplasm of cervix: Secondary | ICD-10-CM | POA: Diagnosis not present

## 2021-02-21 DIAGNOSIS — Z7901 Long term (current) use of anticoagulants: Secondary | ICD-10-CM

## 2021-02-21 DIAGNOSIS — N939 Abnormal uterine and vaginal bleeding, unspecified: Secondary | ICD-10-CM | POA: Diagnosis not present

## 2021-02-21 MED ORDER — MEGESTROL ACETATE 40 MG PO TABS: 80.0000 mg | ORAL_TABLET | Freq: Every day | ORAL | 5 refills | Status: DC

## 2021-02-21 NOTE — Progress Notes (Addendum)
GYNECOLOGY OFFICE VISIT NOTE  History:   Bridget [name] is a 45 y.o. [phone] here today for discussion of episode of heavy bleeding for 3 weeks.  History of PE, on Xarelto. Reports long history of heavy menstrual periods, even before being anticoagulated. However, she had episode of heavy bleeding that lasted 3 weeks, still ongoing but lighter in flow currently.  Was evaluated in ER on 02/19/21, hemoglobin was 11.9, ultrasound showed small uterine fibroids and 5.6 mm endometrial stripe.  Patient reports the fibroid sizes have been stable for years.  She denies any abnormal vaginal discharge,  pelvic pain or other concerns.    Past Medical History:  Diagnosis Date   Constipation    Edema    Headache(784.0)    Morbid obesity ([place])    Pulmonary embolism ([place])     Past Surgical History:  Procedure Laterality Date   BREAST REDUCTION SURGERY     BREAST SURGERY  2005   reduction   CESAREAN SECTION N/A 03/20/2016   Procedure: CESAREAN SECTION;  Surgeon: Christophe Louis, MD;  Location: [facility];  Service: Obstetrics;  Laterality: N/A;   COLONOSCOPY WITH PROPOFOL N/A 05/11/2020   Procedure: COLONOSCOPY WITH PROPOFOL;  Surgeon: [name], MD;  Location: [facility];  Service: Endoscopy;  Laterality: N/A;   POLYPECTOMY  05/11/2020   Procedure: POLYPECTOMY;  Surgeon: [name], MD;  Location: [facility];  Service: Endoscopy;;   TUBAL LIGATION Bilateral 03/20/2016   Procedure: BILATERAL TUBAL LIGATION;  Surgeon: Christophe Louis, MD;  Location: [place];  Service: Obstetrics;  Laterality: Bilateral;    The following portions of the patient's history were reviewed and updated as appropriate: allergies, current medications, past family history, past medical history, past social history, past surgical history and problem list.   Health Maintenance:  Normal pap and negative HRHPV in 2018.  Normal mammogram on 10/26/2019  Review of Systems:  Pertinent items noted in HPI and  remainder of comprehensive ROS otherwise negative.  Physical Exam:  BP 136/76   Pulse 75   Wt 289 lb (131.1 kg)   LMP 02/05/2021 (Exact Date)   BMI 49.61 kg/m  CONSTITUTIONAL: Well-developed, well-nourished female in no acute distress.  HEENT:  Normocephalic, atraumatic. External right and left ear normal. No scleral icterus.  NECK: Normal range of motion, supple, no masses noted on observation SKIN: No rash noted. Not diaphoretic. No erythema. No pallor. MUSCULOSKELETAL: Normal range of motion. No edema noted. NEUROLOGIC: Alert and oriented to person, place, and time. Normal muscle tone coordination. No cranial nerve deficit noted. PSYCHIATRIC: Normal mood and affect. Normal behavior. Normal judgment and thought content. CARDIOVASCULAR: Normal heart rate noted RESPIRATORY: Effort and breath sounds normal, no problems with respiration noted ABDOMEN: No masses noted. No other overt distention noted.   PELVIC: Normal appearing external genitalia; normal urethral meatus; normal appearing vaginal mucosa and cervix.  Active slow trickle of blood noted, testing sample obtained.  Pap smear done.  Normal uterine size, no other palpable masses, no uterine or adnexal tenderness. Performed in the presence of a chaperone  Labs and Imaging Results for orders placed or performed during the hospital encounter of 02/19/21 (from the past 168 hour(s))  Pregnancy, urine   Collection Time: 02/19/21  6:20 PM  Result Value Ref Range   Preg Test, Ur NEGATIVE NEGATIVE  CBC   Collection Time: 02/19/21  7:57 PM  Result Value Ref Range   WBC 7.0 4.0 - 10.5 K/uL   RBC 3.75 (  L) 3.87 - 5.11 MIL/uL   Hemoglobin 11.9 (L) 12.0 - 15.0 g/dL   HCT 36.0 36.0 - 46.0 %   MCV 96.0 80.0 - 100.0 fL   MCH 31.7 26.0 - 34.0 pg   MCHC 33.1 30.0 - 36.0 g/dL   RDW 12.0 11.5 - 15.5 %   Platelets 329 150 - 400 K/uL   nRBC 0.0 0.0 - 0.2 %   US PELVIC COMPLETE WITH TRANSVAGINAL  Result Date: 02/19/2021 CLINICAL DATA:  Pelvic  pain EXAM: TRANSABDOMINAL AND TRANSVAGINAL ULTRASOUND OF PELVIS TECHNIQUE: Both transabdominal and transvaginal ultrasound examinations of the pelvis were performed. Transabdominal technique was performed for global imaging of the pelvis including uterus, ovaries, adnexal regions, and pelvic cul-de-sac. It was necessary to proceed with endovaginal exam following the transabdominal exam to visualize the uterus and endometrium. COMPARISON:  CT 11/02/2020 FINDINGS: Uterus Measurements: 7.7 by 4.7 x 6.5 cm = volume: 121.7 mL. Subtle heterogeneous mass in the anterior uterine corpus measuring 2 x 1.2 by 1.9 cm. Right uterine fundus mass measuring 1.6 by 1.3 by 1.6 cm. Endometrium Thickness: 5.6 mm.  No focal abnormality visualized. Right ovary Not visualized Left ovary Not visualized Other findings No abnormal free fluid. IMPRESSION: 1. Normal premenopausal endometrial thickness. 2. Small uterine fibroids 3. The ovaries are nonvisualized. Electronically Signed   By: Donavan Foil M.D.   On: 02/19/2021 23:08      Assessment and Plan:     1. Abnormal uterine bleeding (AUB) 2. Anticoagulated 3. History of pulmonary embolus (PE) Unsure etiology of bleeding, could be abnormal perimenopausal bleeding exacerbated by anticoagulation. Cervicovaginal ancillary sent to rule out infection/vaginitis.  Reassuring ultrasound. Will try Megace for management, follow results. Discussed progestin IUD as another long term therapy, she is considering this. Will reevaluate in about one month. - Cervicovaginal ancillary only - megestrol (MEGACE) 40 MG tablet; Take 2 tablets (80 mg total) by mouth daily. Can increase to two tablets twice a day in the event of heavy bleeding  Dispense: 60 tablet; Refill: 5  4. Pap smear for cervical cancer screening - Cytology - PAP done, worried it may be unsatisfactory due to obscuring blood. Will follow up results and manage accordingly.  Routine preventative health maintenance measures  emphasized. Please refer to After Visit Summary for other counseling recommendations.   Return in about 5 weeks (around 03/28/2021) for Followup AUB.    I spent 20 minutes dedicated to the care of this patient including pre-visit review of records, face to face time with the patient discussing her conditions and treatments and post visit orders.    Verita Schneiders, MD, [place] for [employer], [place]

## 2021-02-22 ENCOUNTER — Encounter: Payer: Self-pay | Admitting: Obstetrics & Gynecology

## 2021-02-22 NOTE — Addendum Note (Signed)
Addended by: Verita Schneiders A on: 02/22/2021 04:44 PM   Modules accepted: Orders

## 2021-02-23 LAB — CERVICOVAGINAL ANCILLARY ONLY
Bacterial Vaginitis (gardnerella): NEGATIVE
Candida Glabrata: NEGATIVE
Candida Vaginitis: NEGATIVE
Chlamydia: NEGATIVE
Comment: NEGATIVE
Comment: NEGATIVE
Comment: NEGATIVE
Comment: NEGATIVE
Comment: NEGATIVE
Comment: NORMAL
Neisseria Gonorrhea: NEGATIVE
Trichomonas: NEGATIVE

## 2021-02-27 LAB — CYTOLOGY - PAP
Comment: NEGATIVE
Diagnosis: NEGATIVE
High risk HPV: NEGATIVE

## 2021-03-06 ENCOUNTER — Ambulatory Visit (INDEPENDENT_AMBULATORY_CARE_PROVIDER_SITE_OTHER): Payer: 59 | Admitting: Internal Medicine

## 2021-03-06 ENCOUNTER — Encounter: Payer: Self-pay | Admitting: Internal Medicine

## 2021-03-06 ENCOUNTER — Other Ambulatory Visit: Payer: Self-pay

## 2021-03-06 DIAGNOSIS — Z7901 Long term (current) use of anticoagulants: Secondary | ICD-10-CM | POA: Diagnosis not present

## 2021-03-06 DIAGNOSIS — I2782 Chronic pulmonary embolism: Secondary | ICD-10-CM | POA: Diagnosis not present

## 2021-03-06 DIAGNOSIS — G43109 Migraine with aura, not intractable, without status migrainosus: Secondary | ICD-10-CM

## 2021-03-06 DIAGNOSIS — Z23 Encounter for immunization: Secondary | ICD-10-CM | POA: Diagnosis not present

## 2021-03-06 NOTE — Progress Notes (Signed)
Subjective:  Patient ID: Bridget [name], female    DOB: 1975/11/22  Age: 45 y.o. MRN: [id number]  CC: Migraine (Pt states the med that was rx helps w/ the migraines but makes her drowsy.. want to discuss FMLA)   HPI Bridget [name] presents for migraine HA related to her period. Taking Maxalt prn - sometimes disabling 4d per  1 mo   Outpatient Medications Prior to Visit  Medication Sig Dispense Refill   acetaminophen (TYLENOL) 500 MG tablet Take 1,000 mg by mouth every 6 (six) hours as needed for mild pain.     albuterol (VENTOLIN HFA) 108 (90 Base) MCG/ACT inhaler Inhale 1-2 puffs into the lungs every 6 (six) hours as needed for wheezing or shortness of breath. 18 g 0   cetirizine (ZYRTEC) 10 MG tablet Take 10 mg by mouth daily as needed for allergies.     Cholecalciferol (VITAMIN D3) 125 MCG (5000 UT) CAPS Take 1 capsule by mouth daily.     clonazePAM (KLONOPIN) 0.5 MG disintegrating tablet Take 1-2 tablets (0.5-1 mg total) by mouth 2 (two) times daily as needed (panic attacks). 60 tablet 1   Cyanocobalamin (VITAMIN B-12) 1000 MCG SUBL Place 1 tablet (1,000 mcg total) under the tongue daily. 100 tablet 3   megestrol (MEGACE) 40 MG tablet Take 2 tablets (80 mg total) by mouth daily. Can increase to two tablets twice a day in the event of heavy bleeding 60 tablet 5   Multiple Vitamin (MULTIVITAMIN) capsule Take 1 capsule by mouth daily.     ondansetron (ZOFRAN) 4 MG tablet Take 1 tablet (4 mg total) by mouth every 8 (eight) hours as needed for nausea or vomiting. 24 tablet 2   pantoprazole (PROTONIX) 40 MG tablet Take 1 tablet (40 mg total) by mouth daily. 90 tablet 01   potassium chloride SA (KLOR-CON) 20 MEQ tablet Take 1 tablet (20 mEq total) by mouth 2 (two) times daily. Must keep scheduled appt for future refills need potassium check 180 tablet 3   rivaroxaban (XARELTO) 20 MG TABS tablet TAKE 1 TABLET BY MOUTH ONCE DAILY WITH SUPPER 30 tablet 5   rizatriptan (MAXALT) 10 MG tablet  Take 1 tablet (10 mg total) by mouth once as needed for up to 1 dose for migraine. May repeat in 2 hours if needed 12 tablet 11   triamterene-hydrochlorothiazide (MAXZIDE-25) 37.5-25 MG tablet Take 1 tablet by mouth daily. 90 tablet 3   doxycycline (VIBRAMYCIN) 100 MG capsule Take 1 capsule (100 mg total) by mouth 2 (two) times daily. (Patient not taking: Reported on 03/06/2021) 14 capsule 0   predniSONE (DELTASONE) 10 MG tablet 40 mg x 3 days, 20 mg x 3 days, 10 mg x 3 days (Patient not taking: Reported on 03/06/2021) 21 tablet 0   No facility-administered medications prior to visit.    ROS: Review of Systems  Constitutional:  Negative for activity change, appetite change, chills, fatigue and unexpected weight change.  HENT:  Negative for congestion, mouth sores and sinus pressure.   Eyes:  Negative for visual disturbance.  Respiratory:  Negative for cough and chest tightness.   Gastrointestinal:  Negative for abdominal pain and nausea.  Genitourinary:  Negative for difficulty urinating, frequency and vaginal pain.  Musculoskeletal:  Negative for back pain and gait problem.  Skin:  Negative for pallor and rash.  Neurological:  Positive for headaches. Negative for dizziness, tremors, weakness and numbness.  Psychiatric/Behavioral:  Negative for confusion and sleep disturbance. The patient is nervous/anxious.  Objective:  BP 118/72 (BP Location: Left Arm)    Pulse 71    Temp 98.2 F (36.8 C) (Oral)    Ht 5\' 4"  (1.626 m)    Wt 291 lb 3.2 oz (132.1 kg)    LMP 02/05/2021 (Exact Date)    SpO2 99%    BMI 49.98 kg/m   BP Readings from Last 3 Encounters:  03/06/21 118/72  02/21/21 136/76  02/20/21 131/89    Wt Readings from Last 3 Encounters:  03/06/21 291 lb 3.2 oz (132.1 kg)  02/21/21 289 lb (131.1 kg)  02/19/21 286 lb (129.7 kg)    Physical Exam Constitutional:      General: She is not in acute distress.    Appearance: She is well-developed. She is obese.  HENT:     Head:  Normocephalic.     Right Ear: External ear normal.     Left Ear: External ear normal.     Nose: Nose normal.  Eyes:     General:        Right eye: No discharge.        Left eye: No discharge.     Conjunctiva/sclera: Conjunctivae normal.     Pupils: Pupils are equal, round, and reactive to light.  Neck:     Thyroid: No thyromegaly.     Vascular: No JVD.     Trachea: No tracheal deviation.  Cardiovascular:     Rate and Rhythm: Normal rate and regular rhythm.     Heart sounds: Normal heart sounds.  Pulmonary:     Effort: No respiratory distress.     Breath sounds: No stridor. No wheezing.  Abdominal:     General: Bowel sounds are normal. There is no distension.     Palpations: Abdomen is soft. There is no mass.     Tenderness: There is no abdominal tenderness. There is no guarding or rebound.  Musculoskeletal:        General: No tenderness.     Cervical back: Normal range of motion and neck supple. No rigidity.  Lymphadenopathy:     Cervical: No cervical adenopathy.  Skin:    Findings: No erythema or rash.  Neurological:     Cranial Nerves: No cranial nerve deficit.     Motor: No abnormal muscle tone.     Coordination: Coordination normal.     Deep Tendon Reflexes: Reflexes normal.  Psychiatric:        Behavior: Behavior normal.        Thought Content: Thought content normal.        Judgment: Judgment normal.   Tight neck muscles Lab Results  Component Value Date   WBC 7.0 02/19/2021   HGB 11.9 (L) 02/19/2021   HCT 36.0 02/19/2021   PLT 329 02/19/2021   GLUCOSE 113 (H) 12/02/2020   CHOL 147 10/10/2020   TRIG 109.0 10/10/2020   HDL 44.30 10/10/2020   LDLCALC 81 10/10/2020   ALT 14 10/10/2020   AST 20 10/10/2020   NA 138 12/02/2020   K 3.9 12/02/2020   CL 104 12/02/2020   CREATININE 0.72 12/02/2020   BUN 12 12/02/2020   CO2 25 12/02/2020   TSH 0.76 10/10/2020   HGBA1C 5.3 10/29/2017    No results found.  Assessment & Plan:   Problem List Items Addressed  This Visit     Anticoagulated    We will continue Xarelto for now.      Migraines    Taking Maxalt prn - sometimes disabling 4d  per  1 mo      Pulmonary embolism ([place])    On Xarelto         No orders of the defined types were placed in this encounter.     Follow-up: No follow-ups on file.  Walker Kehr, MD

## 2021-03-06 NOTE — Assessment & Plan Note (Signed)
On Xarelto 

## 2021-03-06 NOTE — Assessment & Plan Note (Signed)
Taking Maxalt prn - sometimes disabling 4d per  1 mo

## 2021-03-19 ENCOUNTER — Encounter: Payer: Self-pay | Admitting: Internal Medicine

## 2021-03-19 NOTE — Assessment & Plan Note (Signed)
We will continue Xarelto for now.

## 2021-03-21 ENCOUNTER — Encounter: Payer: Self-pay | Admitting: Internal Medicine

## 2021-03-21 NOTE — Telephone Encounter (Signed)
Downloaded form.. fill out MD information place in [facility] folder for completion.Marland KitchenJohny [name]

## 2021-03-23 NOTE — Telephone Encounter (Signed)
MD signed.. faxed form to [phone].Marland KitchenJohny Chess

## 2021-03-28 LAB — HM MAMMOGRAPHY

## 2021-03-29 ENCOUNTER — Encounter: Payer: Self-pay | Admitting: Internal Medicine

## 2021-04-03 ENCOUNTER — Ambulatory Visit (INDEPENDENT_AMBULATORY_CARE_PROVIDER_SITE_OTHER): Payer: 59 | Admitting: Obstetrics & Gynecology

## 2021-04-03 ENCOUNTER — Other Ambulatory Visit: Payer: Self-pay

## 2021-04-03 ENCOUNTER — Encounter: Payer: Self-pay | Admitting: Obstetrics & Gynecology

## 2021-04-03 VITALS — BP 135/86 | HR 62 | Ht 64.0 in | Wt 292.4 lb

## 2021-04-03 DIAGNOSIS — N939 Abnormal uterine and vaginal bleeding, unspecified: Secondary | ICD-10-CM | POA: Diagnosis not present

## 2021-04-03 DIAGNOSIS — Z7901 Long term (current) use of anticoagulants: Secondary | ICD-10-CM

## 2021-04-03 DIAGNOSIS — Z86711 Personal history of pulmonary embolism: Secondary | ICD-10-CM

## 2021-04-03 NOTE — Progress Notes (Signed)
° ° °  GYNECOLOGY OFFICE VISIT NOTE  History:   Bridget [name] is a 46 y.o. [phone] here today for follow up of AUB, was prescribed Megace during last visit. History of PE, on Xarelto currently. Reports that abnormal bleeding stopped after visit, she has not needed to take Megace and had normal period recently.  She denies any abnormal vaginal discharge,  pelvic pain or other concerns.    Past Medical History:  Diagnosis Date   Constipation    Edema    Headache(784.0)    Morbid obesity ([place])    Pulmonary embolism ([place])     Past Surgical History:  Procedure Laterality Date   BREAST REDUCTION SURGERY     BREAST SURGERY  2005   reduction   CESAREAN SECTION N/A 03/20/2016   Procedure: CESAREAN SECTION;  Surgeon: Christophe Louis, MD;  Location: [place];  Service: Obstetrics;  Laterality: N/A;   COLONOSCOPY WITH PROPOFOL N/A 05/11/2020   Procedure: COLONOSCOPY WITH PROPOFOL;  Surgeon: [name], MD;  Location: [facility];  Service: Endoscopy;  Laterality: N/A;   POLYPECTOMY  05/11/2020   Procedure: POLYPECTOMY;  Surgeon: [name], MD;  Location: [facility];  Service: Endoscopy;;   TUBAL LIGATION Bilateral 03/20/2016   Procedure: BILATERAL TUBAL LIGATION;  Surgeon: Christophe Louis, MD;  Location: [place];  Service: Obstetrics;  Laterality: Bilateral;    The following portions of the patient's history were reviewed and updated as appropriate: allergies, current medications, past family history, past medical history, past social history, past surgical history and problem list.   Health Maintenance:  Normal pap and negative HRHPV on 02/21/2021.  Normal mammogram on 03/28/2021  Review of Systems:  Pertinent items noted in HPI and remainder of comprehensive ROS otherwise negative.  Physical Exam:  BP 135/86 (BP Location: Left Arm, Patient Position: Sitting, Cuff Size: Large)    Pulse 62    Ht 5\' 4"  (1.626 m)    Wt 292 lb 6.4 oz (132.6 kg)    LMP 03/15/2021 (Approximate)     Breastfeeding No    BMI 50.19 kg/m  CONSTITUTIONAL: Well-developed, well-nourished female in no acute distress.  MUSCULOSKELETAL: Normal range of motion. No edema noted. NEUROLOGIC: Alert and oriented to person, place, and time. Normal muscle tone coordination. No cranial nerve deficit noted. PSYCHIATRIC: Normal mood and affect. Normal behavior. Normal judgment and thought content. CARDIOVASCULAR: Normal heart rate noted RESPIRATORY: Effort and breath sounds normal, no problems with respiration noted ABDOMEN: No masses noted. No other overt distention noted.   PELVIC: Deferred    Assessment and Plan:     1. Abnormal uterine bleeding (AUB) 2. Anticoagulated 3. History of pulmonary embolus (PE) No AUB for now. If recurs, will need endometrial biopsy and treatment with Megace vs progestin IUD. Also discussed endometrial ablation as alternative treatment. Discussed these modalities in detail, all questions answered.  Handouts given to patient.  Bleeding precautions reviewed.    Routine preventative health maintenance measures emphasized. Please refer to After Visit Summary for other counseling recommendations.   Return for any gynecologic concerns.    I spent 20 minutes dedicated to the care of this patient including pre-visit review of records, face to face time with the patient discussing her conditions and treatments and post visit orders.    Verita Schneiders, MD, [place] for [employer], [place]

## 2021-05-08 ENCOUNTER — Encounter: Payer: 59 | Admitting: Obstetrics & Gynecology

## 2021-05-30 ENCOUNTER — Other Ambulatory Visit: Payer: Self-pay

## 2021-05-30 ENCOUNTER — Encounter: Payer: Self-pay | Admitting: Internal Medicine

## 2021-05-30 ENCOUNTER — Telehealth (INDEPENDENT_AMBULATORY_CARE_PROVIDER_SITE_OTHER): Payer: 59 | Admitting: Internal Medicine

## 2021-05-30 DIAGNOSIS — J0100 Acute maxillary sinusitis, unspecified: Secondary | ICD-10-CM | POA: Diagnosis not present

## 2021-05-30 MED ORDER — AMOXICILLIN-POT CLAVULANATE 875-125 MG PO TABS: 1.0000 | ORAL_TABLET | Freq: Two times a day (BID) | ORAL | 0 refills | Status: DC

## 2021-05-30 MED ORDER — FLUCONAZOLE 150 MG PO TABS: 150.0000 mg | ORAL_TABLET | Freq: Once | ORAL | 0 refills | Status: AC

## 2021-05-30 MED ORDER — FLUTICASONE PROPIONATE 50 MCG/ACT NA SUSP: 2.0000 | Freq: Every day | NASAL | 6 refills | Status: DC

## 2021-05-30 NOTE — Assessment & Plan Note (Signed)
Rx augmentin 10 day course. Rx diflucan to cover for possible yeast infection to take if needed. Continue otc zyrtc daily. Rx flonase to add on to help with sinus congestion and allergies.  ?

## 2021-05-30 NOTE — Progress Notes (Signed)
Virtual Visit via Video Note ? ?I connected with Bridget [name] on 05/30/21 at 10:00 AM EDT by a video enabled telemedicine application and verified that I am speaking with the correct person using two identifiers. ? ?The patient and the provider were at separate locations throughout the entire encounter. Patient location: home, Provider location: work ?  ?I discussed the limitations of evaluation and management by telemedicine and the availability of in person appointments. The patient expressed understanding and agreed to proceed. The patient and the provider were the only parties present for the visit unless noted in HPI below. ? ?History of Present Illness: The patient is a 46 y.o. female with visit for cough/congestion.  ? ?Observations/Objective: Appearance: normal, breathing appears normal, no coughing during visit, mental status is A and O times 3 ? ?Assessment and Plan: See problem oriented charting ? ?Follow Up Instructions: rx augmentin, flonase, diflucan ? ?I discussed the assessment and treatment plan with the patient. The patient was provided an opportunity to ask questions and all were answered. The patient agreed with the plan and demonstrated an understanding of the instructions. ?  ?The patient was advised to call back or seek an in-person evaluation if the symptoms worsen or if the condition fails to improve as anticipated. ? ?Hoyt Koch, MD ? ?

## 2021-06-01 ENCOUNTER — Telehealth: Payer: Self-pay | Admitting: Internal Medicine

## 2021-06-01 NOTE — Telephone Encounter (Signed)
Pt states "I think I over took my medication" pt stated she after taken a dose of triamterene-hydrochlorothiazide (MAXZIDE-25) 37.5-25 MG tablet, she could not recall if she had already taken it and started feeling woozy and lightheaded  ? ?Pt requested to speak w/ the nurse line ? ?Call transferred to team health ?

## 2021-06-04 NOTE — Telephone Encounter (Signed)
Connected to Team Health.  ? ?Caller states she thinks she took too much of her medication Triamterene and is feeling light headed. ? ?Advised to call Poison Control.  ?

## 2021-06-05 NOTE — Telephone Encounter (Signed)
Noted.  Did she take one tablet extra accidentally or was she trying to hurt herself?  Thanks ?

## 2021-06-07 NOTE — Telephone Encounter (Signed)
Noted. Not a problem ?Thx ?

## 2021-06-16 ENCOUNTER — Other Ambulatory Visit: Payer: Self-pay | Admitting: Internal Medicine

## 2021-08-07 ENCOUNTER — Telehealth: Payer: Self-pay
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
## 2021-08-08 NOTE — Telephone Encounter (Signed)
Notified pt w/MD response. Pt states today she feel better, but yesterday she ended up with a headache, but she feel fine today...Bridget [name]
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
## 2021-09-28 ENCOUNTER — Other Ambulatory Visit: Payer: Self-pay | Admitting: Internal Medicine

## 2021-10-04 ENCOUNTER — Emergency Department (HOSPITAL_BASED_OUTPATIENT_CLINIC_OR_DEPARTMENT_OTHER): Payer: 59

## 2021-10-04 ENCOUNTER — Encounter (HOSPITAL_BASED_OUTPATIENT_CLINIC_OR_DEPARTMENT_OTHER): Payer: Self-pay | Admitting: Emergency Medicine

## 2021-10-04 ENCOUNTER — Emergency Department (HOSPITAL_BASED_OUTPATIENT_CLINIC_OR_DEPARTMENT_OTHER)
Admission: EM | Admit: 2021-10-04 | Discharge: 2021-10-04 | Disposition: A | Discharge status: Home / Self Care | Payer: 59 | Attending: Emergency Medicine | Admitting: Emergency Medicine

## 2021-10-04 ENCOUNTER — Other Ambulatory Visit: Payer: Self-pay

## 2021-10-04 DIAGNOSIS — Z7901 Long term (current) use of anticoagulants: Secondary | ICD-10-CM | POA: Insufficient documentation

## 2021-10-04 DIAGNOSIS — R079 Chest pain, unspecified: Secondary | ICD-10-CM | POA: Diagnosis not present

## 2021-10-04 LAB — CBC
HCT: 41.3 % (ref 36.0–46.0)
Hemoglobin: 14.1 g/dL (ref 12.0–15.0)
MCH: 32.5 pg (ref 26.0–34.0)
MCHC: 34.1 g/dL (ref 30.0–36.0)
MCV: 95.2 fL (ref 80.0–100.0)
Platelets: 368 10*3/uL (ref 150–400)
RBC: 4.34 MIL/uL (ref 3.87–5.11)
RDW: 12.1 % (ref 11.5–15.5)
WBC: 7.4 10*3/uL (ref 4.0–10.5)
nRBC: 0 % (ref 0.0–0.2)

## 2021-10-04 LAB — BASIC METABOLIC PANEL
Anion gap: 7 (ref 5–15)
BUN: 14 mg/dL (ref 6–20)
CO2: 26 mmol/L (ref 22–32)
Calcium: 9.1 mg/dL (ref 8.9–10.3)
Chloride: 105 mmol/L (ref 98–111)
Creatinine, Ser: 0.69 mg/dL (ref 0.44–1.00)
GFR, Estimated: >60 mL/min (ref >60)
Glucose, Bld: 101 mg/dL — ABNORMAL HIGH (ref 70–99)
Potassium: 3.9 mmol/L (ref 3.5–5.1)
Sodium: 138 mmol/L (ref 135–145)

## 2021-10-04 LAB — TROPONIN I (HIGH SENSITIVITY): Troponin I (High Sensitivity): 3 ng/L (ref <18)

## 2021-10-04 MED ORDER — IOHEXOL 350 MG/ML SOLN
100.0000 mL | Freq: Once | INTRAVENOUS | Status: AC | PRN
  Administered 2021-10-04: 100 mL via INTRAVENOUS

## 2021-10-04 NOTE — ED Provider Notes (Signed)
[name] Provider Note   CSN: [id number] Arrival date & time: 10/04/21  1744     History  Chief Complaint  Patient presents with   Chest Pain    Bridget [name] is a 46 y.o. female.  Patient here with some intermittent chest pain for the last 2 weeks.  She had suspected history of PE early in COVID and she was on Xarelto the last several years.  There was a lot of discussion on whether or not she needs to stay on this medicine with her primary care team.  She stopped it about a month ago.  She is not having any active chest pain or shortness of breath but she was concerned for possible blood clot.  Denies any abdominal pain, nausea, vomiting.  Nothing makes it worse or better.  The history is provided by the patient.       Home Medications Prior to Admission medications   Medication Sig Start Date End Date Taking? Authorizing Provider  acetaminophen (TYLENOL) 500 MG tablet Take 1,000 mg by mouth every 6 (six) hours as needed for mild pain.    [provider]  albuterol (VENTOLIN HFA) 108 (90 Base) MCG/ACT inhaler Inhale 1-2 puffs into the lungs every 6 (six) hours as needed for wheezing or shortness of breath. 06/21/20   Marrian Salvage, FNP  amoxicillin-clavulanate (AUGMENTIN) 875-125 MG tablet Take 1 tablet by mouth 2 (two) times daily. 05/30/21   Hoyt Koch, MD  cetirizine (ZYRTEC) 10 MG tablet Take 10 mg by mouth daily as needed for allergies.    [provider]  Cholecalciferol (VITAMIN D3) 125 MCG (5000 UT) CAPS Take 1 capsule by mouth daily.    [provider]  clonazePAM (KLONOPIN) 0.5 MG disintegrating tablet Take 1-2 tablets (0.5-1 mg total) by mouth 2 (two) times daily as needed (panic attacks). 09/29/19   Plotnikov, Evie Lacks, MD  Cyanocobalamin (VITAMIN B-12) 1000 MCG SUBL Place 1 tablet (1,000 mcg total) under the tongue daily. 08/04/19   Plotnikov, Evie Lacks, MD  fluticasone (FLONASE) 50  MCG/ACT nasal spray Place 2 sprays into both nostrils daily. 05/30/21   Hoyt Koch, MD  megestrol (MEGACE) 40 MG tablet Take 2 tablets (80 mg total) by mouth daily. Can increase to two tablets twice a day in the event of heavy bleeding 02/21/21   Anyanwu, Sallyanne Havers, MD  Multiple Vitamin (MULTIVITAMIN) capsule Take 1 capsule by mouth daily.    [provider]  ondansetron (ZOFRAN) 4 MG tablet Take 1 tablet (4 mg total) by mouth every 8 (eight) hours as needed for nausea or vomiting. 10/10/20   Plotnikov, Evie Lacks, MD  pantoprazole (PROTONIX) 40 MG tablet Take 1 tablet (40 mg total) by mouth daily. 11/06/20   Plotnikov, Evie Lacks, MD  potassium chloride SA (KLOR-CON M) 20 MEQ tablet TAKE 1 TABLET BY MOUTH TWICE DAILY . APPOINTMENT REQUIRED FOR FUTURE REFILLS NEED  POTASSIUM  CHECK 10/01/21   Plotnikov, Evie Lacks, MD  rizatriptan (MAXALT) 10 MG tablet Take 1 tablet (10 mg total) by mouth once as needed for up to 1 dose for migraine. May repeat in 2 hours if needed 10/10/20   Plotnikov, Evie Lacks, MD  triamterene-hydrochlorothiazide (MAXZIDE-25) 37.5-25 MG tablet Take 1 tablet by mouth daily. 10/10/20   Plotnikov, Evie Lacks, MD  XARELTO 20 MG TABS tablet TAKE 1 TABLET BY MOUTH ONCE DAILY WITH SUPPER 06/18/21   Plotnikov, Evie Lacks, MD      Allergies  Patient has no known allergies.    Review of Systems   Review of Systems  Physical Exam Updated Vital Signs BP 126/84   Pulse 65   Temp 98.9 F (37.2 C) (Oral)   Resp 19   LMP 09/30/2021 (Exact Date)   SpO2 100%  Physical Exam Vitals and nursing note reviewed.  Constitutional:      General: She is not in acute distress.    Appearance: She is well-developed.  HENT:     Head: Normocephalic and atraumatic.  Eyes:     Conjunctiva/sclera: Conjunctivae normal.     Pupils: Pupils are equal, round, and reactive to light.  Cardiovascular:     Rate and Rhythm: Normal rate and regular rhythm.     Heart sounds: No murmur  heard. Pulmonary:     Effort: Pulmonary effort is normal. No respiratory distress.     Breath sounds: Normal breath sounds.  Abdominal:     Palpations: Abdomen is soft.     Tenderness: There is no abdominal tenderness.  Musculoskeletal:        General: No swelling.     Cervical back: Normal range of motion and neck supple.  Skin:    General: Skin is warm and dry.     Capillary Refill: Capillary refill takes less than 2 seconds.  Neurological:     Mental Status: She is alert.  Psychiatric:        Mood and Affect: Mood normal.     ED Results / Procedures / Treatments   Labs (all labs ordered are listed, but only abnormal results are displayed) Labs Reviewed  BASIC METABOLIC PANEL - Abnormal; Notable for the following components:      Result Value   Glucose, Bld 101 (*)    All other components within normal limits  CBC  TROPONIN I (HIGH SENSITIVITY)    EKG EKG Interpretation  Date/Time:  Thursday October 04 2021 17:55:12 EDT Ventricular Rate:  77 PR Interval:  150 QRS Duration: 72 QT Interval:  364 QTC Calculation: 411 R Axis:   32 Text Interpretation: Normal sinus rhythm Normal ECG When compared with ECG of 02-Dec-2020 20:48, PREVIOUS ECG IS PRESENT Confirmed by Lennice Sites ([id number]) on 10/04/2021 5:58:38 PM  Radiology CT Angio Chest PE W and/or Wo Contrast  Result Date: 10/04/2021 CLINICAL DATA:  Intermittent left chest pain for 2 weeks. EXAM: CT ANGIOGRAPHY CHEST WITH CONTRAST TECHNIQUE: Multidetector CT imaging of the chest was performed using the standard protocol during bolus administration of intravenous contrast. Multiplanar CT image reconstructions and MIPs were obtained to evaluate the vascular anatomy. RADIATION DOSE REDUCTION: This exam was performed according to the departmental dose-optimization program which includes automated exposure control, adjustment of the mA and/or kV according to patient size and/or use of iterative reconstruction technique. CONTRAST:   144m OMNIPAQUE IOHEXOL 350 MG/ML SOLN COMPARISON:  11/02/2020 FINDINGS: Cardiovascular: The heart is within normal limits in size. No pericardial effusion. The aorta is normal in caliber. No dissection. Minimal scattered calcification. The pulmonary arterial tree is well opacified. No filling defects to suggest pulmonary embolism. Mediastinum/Nodes: No mediastinal or hilar mass or lymphadenopathy. The esophagus is grossly normal. Lungs/Pleura: No pulmonary infiltrates or pleural effusions. No worrisome pulmonary lesions or nodules. Upper Abdomen: No significant upper abdominal findings. 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 No complicating features are identified. Stable simple right renal cyst. No follow-up is necessary. Musculoskeletal: No breast masses, supraclavicular or axillary adenopathy. The bony thorax is intact. Stable significant thoracolumbar  scoliosis. Review of the MIP images confirms the above findings. IMPRESSION: 1. No CT findings for pulmonary embolism. 2. Normal thoracic aorta. 3. No acute pulmonary findings or worrisome pulmonary lesions. 4. 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 No complicating features are identified. 5. Stable thoracolumbar scoliosis. Aortic Atherosclerosis (ICD10-I70.0). Electronically Signed   By: Marijo Sanes M.D.   On: 10/04/2021 19:33   DG Chest 2 View  Result Date: 10/04/2021 CLINICAL DATA:  Chest pain for 2 weeks EXAM: CHEST - 2 VIEW COMPARISON:  12/02/2020 FINDINGS: No focal consolidation. No pleural effusion or pneumothorax. Heart and mediastinal contours are unremarkable. No acute osseous abnormality. Dextroscoliosis of the thoracolumbar spine. IMPRESSION: No active cardiopulmonary disease. Electronically Signed   By: Kathreen Devoid M.D.   On: 10/04/2021 18:18    Procedures Procedures    Medications Ordered in ED Medications  iohexol (OMNIPAQUE) 350 MG/ML injection 100 mL (100 mLs  Intravenous Contrast Given 10/04/21 1926)    ED Course/ Medical Decision Making/ A&P                           Medical Decision Making Amount and/or Complexity of Data Reviewed Labs: ordered. Radiology: ordered.  Risk Prescription drug management.   Bridget [name] is here with chest pain.  Unremarkable vitals.  No fever.  EKG per my review and interpretation shows sinus rhythm.  No ischemic changes.  She has a history of PE but no longer on Xarelto.  She stopped that about a month ago.  It was suspected that she had a blood clot early in COVID she states.  She never had a CT scan that showed a blood clot.  Decision was made to stop blood thinners about a month ago.  There is a family history of blood clots as well.  She has been having some mild chest pain here and there but none currently.  Overall she appears well.  Differential diagnosis is likely some muscular process but will evaluate for ACS, PE, pneumonia.  We will get CBC, BMP, troponin, CT scan of the chest.  Per my review and interpretation of labs is no significant anemia, electrolyte abnormality, kidney injury, leukocytosis.  Troponin is normal.  Overall no cardiac risk factors and atypical sounding story.  Doubt ACS.  Patient with unremarkable CT PE scan.  Overall suspect may be MSK versus GI related symptoms.  We will have her follow-up with primary care doctor.  Discharged in good condition.  This chart was dictated using voice recognition software.  Despite best efforts to proofread,  errors can occur which can change the documentation meaning.         Final Clinical Impression(s) / ED Diagnoses Final diagnoses:  Nonspecific chest pain    Rx / DC Orders ED Discharge Orders     None         Lennice Sites, DO 10/04/21 1946

## 2021-10-04 NOTE — ED Triage Notes (Addendum)
Intermittent left chest pain x 2 weeks, today the pain became constant with radiation down left arm. Denies pain currently. Also endorses mild dizziness and nausea. No vomiting. Hx of PE. Stopped taking Xarelto 1 month ago.

## 2021-10-07 IMAGING — CR DG CHEST 2V
2 series · 2 of 2 positions shown · non-contrast
Comparison: Chest radiograph 10/18/2018

CLINICAL DATA: Patient with shortness of breath and chest pain.

EXAM:
CHEST - 2 VIEW

[w chest pa *]
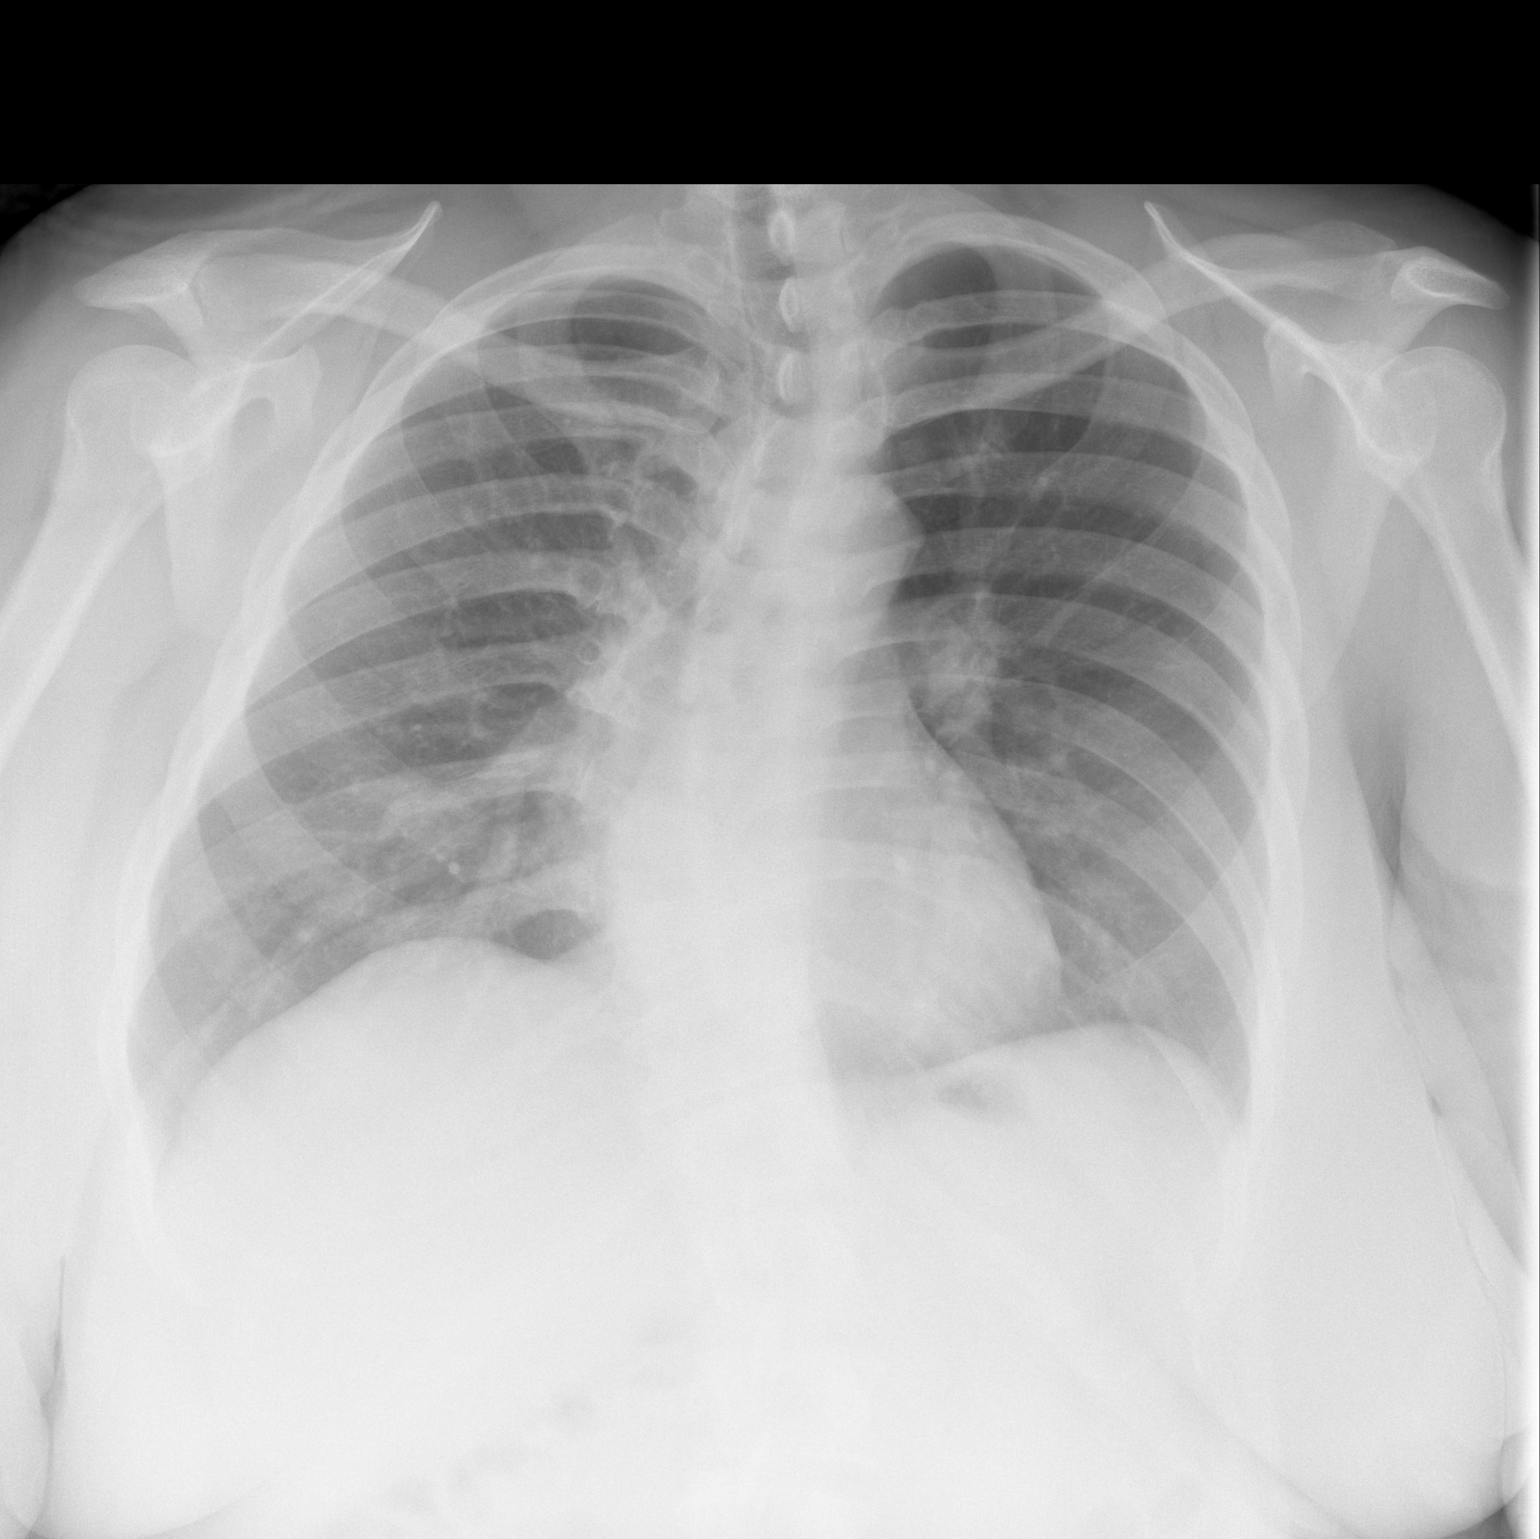

[w chest lat *]
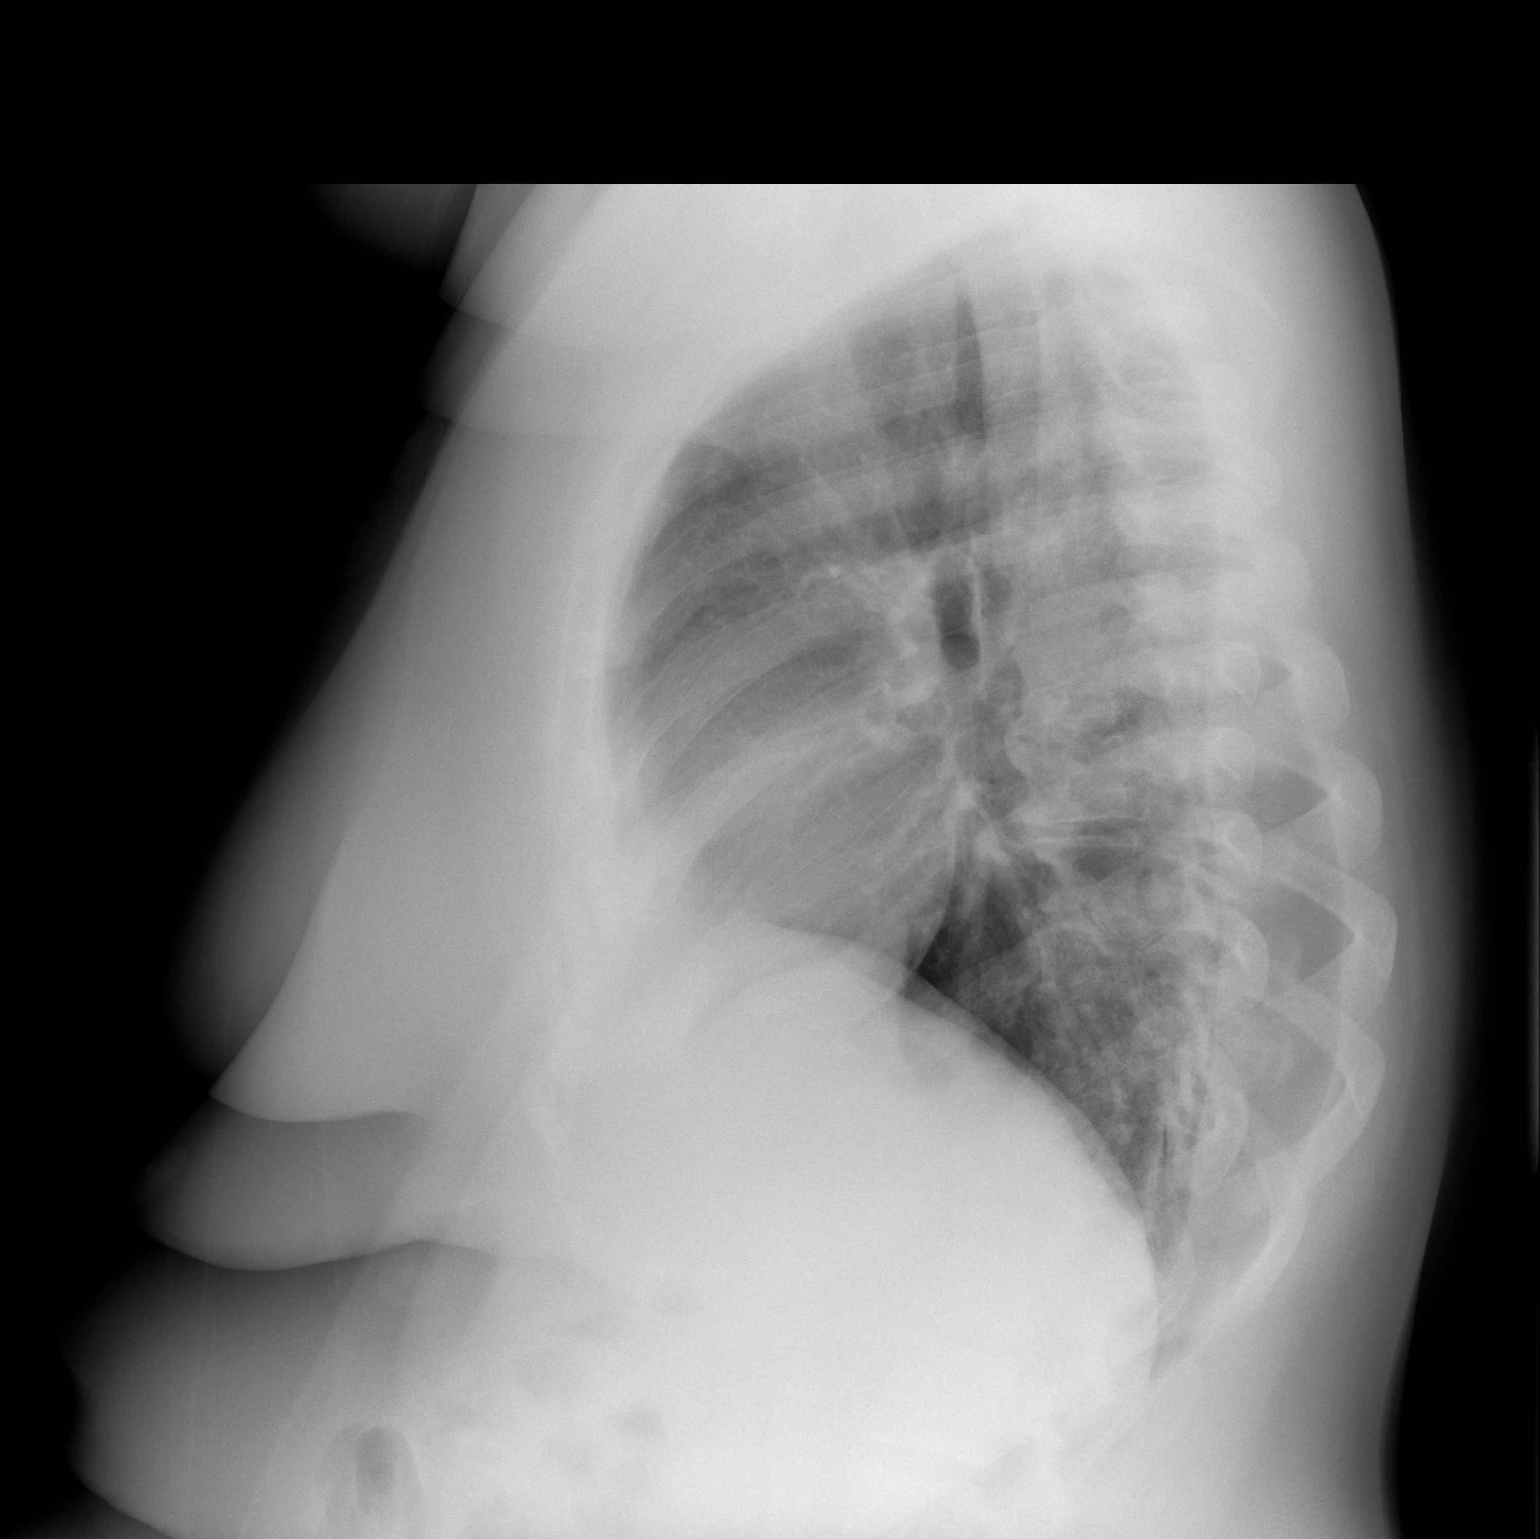

[2 of 2 positions shown; findings below may reference images not displayed]

FINDINGS: Stable cardiac and mediastinal contours. No consolidative pulmonary
opacities. No pleural effusion or pneumothorax. Thoracic spine
degenerative changes.
IMPRESSION: No acute cardiopulmonary process.

## 2021-10-24 ENCOUNTER — Ambulatory Visit (INDEPENDENT_AMBULATORY_CARE_PROVIDER_SITE_OTHER): Payer: 59 | Admitting: Internal Medicine

## 2021-10-24 ENCOUNTER — Encounter: Payer: Self-pay | Admitting: Internal Medicine

## 2021-10-24 DIAGNOSIS — G43109 Migraine with aura, not intractable, without status migrainosus: Secondary | ICD-10-CM | POA: Diagnosis not present

## 2021-10-24 DIAGNOSIS — E559 Vitamin D deficiency, unspecified: Secondary | ICD-10-CM | POA: Diagnosis not present

## 2021-10-24 DIAGNOSIS — E538 Deficiency of other specified B group vitamins: Secondary | ICD-10-CM | POA: Diagnosis not present

## 2021-10-24 DIAGNOSIS — I2782 Chronic pulmonary embolism: Secondary | ICD-10-CM | POA: Diagnosis not present

## 2021-10-24 MED ORDER — AZITHROMYCIN 250 MG PO TABS: ORAL_TABLET | ORAL | 0 refills | Status: DC

## 2021-10-24 MED ORDER — ASPIRIN 81 MG PO TBEC: 81.0000 mg | DELAYED_RELEASE_TABLET | Freq: Every day | ORAL | 3 refills | Status: AC

## 2021-10-24 NOTE — Assessment & Plan Note (Signed)
Vit D 

## 2021-10-24 NOTE — Assessment & Plan Note (Signed)
Pt stopped Xarelto 6 wks ago Start baby ASA EC a day

## 2021-10-24 NOTE — Assessment & Plan Note (Signed)
On B12 

## 2021-10-24 NOTE — Assessment & Plan Note (Signed)
Not better Head CT

## 2021-10-24 NOTE — Progress Notes (Unsigned)
Subjective:  Patient ID: Bridget [name], female    DOB: 1975/12/23  Age: 46 y.o. MRN: [id number]  CC: Follow-up (She is has taken herself off Xarelto, she has been off the medication for a month and a half. She stated that she is having pressure in the back of her head without having a migraine, she wants to make sure it nothing serious. She would like to have a MRI to rule out anything. Ear pressure in her right ear, she can her heart beat pulsating in her right ear. She did have a cold but it has subsided. )   HPI Bridget [name] presents for HAs, pt stopped Xarelto 6 wks ago, asthma Pt lost 65 lbs after sleeve C/o bad HAs  Outpatient Medications Prior to Visit  Medication Sig Dispense Refill   acetaminophen (TYLENOL) 500 MG tablet Take 1,000 mg by mouth every 6 (six) hours as needed for mild pain.     albuterol (VENTOLIN HFA) 108 (90 Base) MCG/ACT inhaler Inhale 1-2 puffs into the lungs every 6 (six) hours as needed for wheezing or shortness of breath. 18 g 0   cetirizine (ZYRTEC) 10 MG tablet Take 10 mg by mouth daily as needed for allergies.     Cholecalciferol (VITAMIN D3) 125 MCG (5000 UT) CAPS Take 1 capsule by mouth daily.     clonazePAM (KLONOPIN) 0.5 MG disintegrating tablet Take 1-2 tablets (0.5-1 mg total) by mouth 2 (two) times daily as needed (panic attacks). 60 tablet 1   Cyanocobalamin (VITAMIN B-12) 1000 MCG SUBL Place 1 tablet (1,000 mcg total) under the tongue daily. 100 tablet 3   fluticasone (FLONASE) 50 MCG/ACT nasal spray Place 2 sprays into both nostrils daily. 16 g 6   megestrol (MEGACE) 40 MG tablet Take 2 tablets (80 mg total) by mouth daily. Can increase to two tablets twice a day in the event of heavy bleeding 60 tablet 5   Multiple Vitamin (MULTIVITAMIN) capsule Take 1 capsule by mouth daily.     ondansetron (ZOFRAN) 4 MG tablet Take 1 tablet (4 mg total) by mouth every 8 (eight) hours as needed for nausea or vomiting. 24 tablet 2   pantoprazole (PROTONIX)  40 MG tablet Take 1 tablet (40 mg total) by mouth daily. 90 tablet 01   rizatriptan (MAXALT) 10 MG tablet Take 1 tablet (10 mg total) by mouth once as needed for up to 1 dose for migraine. May repeat in 2 hours if needed 12 tablet 11   triamterene-hydrochlorothiazide (MAXZIDE-25) 37.5-25 MG tablet Take 1 tablet by mouth daily. 90 tablet 3   amoxicillin-clavulanate (AUGMENTIN) 875-125 MG tablet Take 1 tablet by mouth 2 (two) times daily. 20 tablet 0   potassium chloride SA (KLOR-CON M) 20 MEQ tablet TAKE 1 TABLET BY MOUTH TWICE DAILY . APPOINTMENT REQUIRED FOR FUTURE REFILLS NEED  POTASSIUM  CHECK 60 tablet 0   XARELTO 20 MG TABS tablet TAKE 1 TABLET BY MOUTH ONCE DAILY WITH SUPPER (Patient not taking: Reported on 10/24/2021) 30 tablet 3   No facility-administered medications prior to visit.    ROS: Review of Systems  Constitutional:  Negative for activity change, appetite change, chills, fatigue and unexpected weight change.  HENT:  Negative for congestion, mouth sores and sinus pressure.   Eyes:  Negative for visual disturbance.  Respiratory:  Negative for cough and chest tightness.   Gastrointestinal:  Negative for abdominal pain and nausea.  Genitourinary:  Negative for difficulty urinating, frequency and vaginal pain.  Musculoskeletal:  Negative for back pain and gait problem.  Skin:  Negative for pallor and rash.  Neurological:  Positive for headaches. Negative for dizziness, tremors, weakness and numbness.  Psychiatric/Behavioral:  Negative for confusion, sleep disturbance and suicidal ideas. The patient is nervous/anxious.     Objective:  BP 118/68   Pulse 68   Temp 97.9 F (36.6 C) (Oral)   Ht '5\' 4"'$  (1.626 m)   Wt 288 lb 12.8 oz (131 kg)   LMP 09/30/2021 (Exact Date)   SpO2 98%   BMI 49.57 kg/m   BP Readings from Last 3 Encounters:  10/24/21 118/68  10/04/21 129/75  04/03/21 135/86    Wt Readings from Last 3 Encounters:  10/24/21 288 lb 12.8 oz (131 kg)  04/03/21 292  lb 6.4 oz (132.6 kg)  03/06/21 291 lb 3.2 oz (132.1 kg)    Physical Exam Constitutional:      General: She is not in acute distress.    Appearance: She is well-developed. She is obese.  HENT:     Head: Normocephalic.     Right Ear: External ear normal.     Left Ear: External ear normal.     Nose: Nose normal.  Eyes:     General:        Right eye: No discharge.        Left eye: No discharge.     Conjunctiva/sclera: Conjunctivae normal.     Pupils: Pupils are equal, round, and reactive to light.  Neck:     Thyroid: No thyromegaly.     Vascular: No JVD.     Trachea: No tracheal deviation.  Cardiovascular:     Rate and Rhythm: Normal rate and regular rhythm.     Heart sounds: Normal heart sounds.  Pulmonary:     Effort: No respiratory distress.     Breath sounds: No stridor. No wheezing.  Abdominal:     General: Bowel sounds are normal. There is no distension.     Palpations: Abdomen is soft. There is no mass.     Tenderness: There is no abdominal tenderness. There is no guarding or rebound.  Musculoskeletal:        General: No tenderness.     Cervical back: Normal range of motion and neck supple. No rigidity.  Lymphadenopathy:     Cervical: No cervical adenopathy.  Skin:    Findings: No erythema or rash.  Neurological:     Cranial Nerves: No cranial nerve deficit.     Motor: No abnormal muscle tone.     Coordination: Coordination normal.     Deep Tendon Reflexes: Reflexes normal.  Psychiatric:        Behavior: Behavior normal.        Thought Content: Thought content normal.        Judgment: Judgment normal.     Lab Results  Component Value Date   WBC 7.4 10/04/2021   HGB 14.1 10/04/2021   HCT 41.3 10/04/2021   PLT 368 10/04/2021   GLUCOSE 101 (H) 10/04/2021   CHOL 147 10/10/2020   TRIG 109.0 10/10/2020   HDL 44.30 10/10/2020   LDLCALC 81 10/10/2020   ALT 14 10/10/2020   AST 20 10/10/2020   NA 138 10/04/2021   K 3.9 10/04/2021   CL 105 10/04/2021    CREATININE 0.69 10/04/2021   BUN 14 10/04/2021   CO2 26 10/04/2021   TSH 0.76 10/10/2020   HGBA1C 5.3 10/29/2017    CT Angio Chest PE W and/or Wo  Contrast  Result Date: 10/04/2021 CLINICAL DATA:  Intermittent left chest pain for 2 weeks. EXAM: CT ANGIOGRAPHY CHEST WITH CONTRAST TECHNIQUE: Multidetector CT imaging of the chest was performed using the standard protocol during bolus administration of intravenous contrast. Multiplanar CT image reconstructions and MIPs were obtained to evaluate the vascular anatomy. RADIATION DOSE REDUCTION: This exam was performed according to the departmental dose-optimization program which includes automated exposure control, adjustment of the mA and/or kV according to patient size and/or use of iterative reconstruction technique. CONTRAST:  16m OMNIPAQUE IOHEXOL 350 MG/ML SOLN COMPARISON:  11/02/2020 FINDINGS: Cardiovascular: The heart is within normal limits in size. No pericardial effusion. The aorta is normal in caliber. No dissection. Minimal scattered calcification. The pulmonary arterial tree is well opacified. No filling defects to suggest pulmonary embolism. Mediastinum/Nodes: No mediastinal or hilar mass or lymphadenopathy. The esophagus is grossly normal. Lungs/Pleura: No pulmonary infiltrates or pleural effusions. No worrisome pulmonary lesions or nodules. Upper Abdomen: No significant upper abdominal findings. 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 No complicating features are identified. Stable simple right renal cyst. No follow-up is necessary. Musculoskeletal: No breast masses, supraclavicular or axillary adenopathy. The bony thorax is intact. Stable significant thoracolumbar scoliosis. Review of the MIP images confirms the above findings. IMPRESSION: 1. No CT findings for pulmonary embolism. 2. Normal thoracic aorta. 3. No acute pulmonary findings or worrisome pulmonary lesions. 4. 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 No complicating features are identified. 5. Stable thoracolumbar scoliosis. Aortic Atherosclerosis (ICD10-I70.0). Electronically Signed   By: PMarijo SanesM.D.   On: 10/04/2021 19:33   DG Chest 2 View  Result Date: 10/04/2021 CLINICAL DATA:  Chest pain for 2 weeks EXAM: CHEST - 2 VIEW COMPARISON:  12/02/2020 FINDINGS: No focal consolidation. No pleural effusion or pneumothorax. Heart and mediastinal contours are unremarkable. No acute osseous abnormality. Dextroscoliosis of the thoracolumbar spine. IMPRESSION: No active cardiopulmonary disease. Electronically Signed   By: HKathreen DevoidM.D.   On: 10/04/2021 18:18    Assessment & Plan:   Problem List Items Addressed This Visit     B12 deficiency    On B12      Migraines    Not better Head CT      Relevant Medications   aspirin EC 81 MG tablet   Other Relevant Orders   CT HEAD WO CONTRAST (5MM)   Pulmonary embolism (HCC)    Pt stopped Xarelto 6 wks ago Start baby ASA EC a day      Relevant Medications   aspirin EC 81 MG tablet   Vitamin D deficiency    Vit D         Meds ordered this encounter  Medications   aspirin EC 81 MG tablet    Sig: Take 1 tablet (81 mg total) by mouth daily.    Dispense:  100 tablet    Refill:  3   azithromycin (ZITHROMAX Z-PAK) 250 MG tablet    Sig: As directed    Dispense:  6 tablet    Refill:  0      Follow-up: Return in about 3 months (around 01/24/2022) for a follow-up visit.  AWalker Kehr MD

## 2021-10-25 ENCOUNTER — Other Ambulatory Visit: Payer: Self-pay | Admitting: Internal Medicine

## 2021-11-03 ENCOUNTER — Other Ambulatory Visit: Payer: Self-pay | Admitting: Internal Medicine

## 2021-11-20 ENCOUNTER — Ambulatory Visit (INDEPENDENT_AMBULATORY_CARE_PROVIDER_SITE_OTHER): Payer: 59 | Admitting: Family Medicine

## 2021-11-20 ENCOUNTER — Encounter: Payer: Self-pay | Admitting: Family Medicine

## 2021-11-20 VITALS — BP 132/84 | HR 85 | Temp 98.6°F | Ht 64.0 in | Wt 288.0 lb

## 2021-11-20 DIAGNOSIS — R52 Pain, unspecified: Secondary | ICD-10-CM | POA: Diagnosis not present

## 2021-11-20 DIAGNOSIS — J029 Acute pharyngitis, unspecified: Secondary | ICD-10-CM

## 2021-11-20 DIAGNOSIS — U071 COVID-19: Secondary | ICD-10-CM | POA: Diagnosis not present

## 2021-11-20 LAB — POC COVID19 BINAXNOW: SARS Coronavirus 2 Ag: POSITIVE — AB

## 2021-11-20 MED ORDER — MOLNUPIRAVIR 200 MG PO CAPS: 4.0000 | ORAL_CAPSULE | Freq: Two times a day (BID) | ORAL | 0 refills | Status: AC

## 2021-11-20 NOTE — Patient Instructions (Signed)
Take the anti-viral as prescribed for the next 5 days.   You should quarantine for the next 5 days

## 2021-11-20 NOTE — Progress Notes (Signed)
Subjective: Chief Complaint  Patient presents with   Generalized Body Aches    Sx noticed yesterday   Headache   Nasal Congestion   Sore Throat    No cough     Bridget [name] is a 46 y.o. female who presents for a 2 day history of body aches, headache, nasal congestion and sore throat. Her children who are with her in the office today both tested positive.   Denies fever, chills, dizziness, chest pain, shortness of breath, N/V/D   ROS as in subjective.   Objective: Vitals:   11/20/21 1111  BP: 132/84  Pulse: 85  Temp: 98.6 F (37 C)  SpO2: 96%    Alert and oriented and in no acute distress. Respirations unlabored. Speaking in complete sentences without difficulty. Normal speech, mood and gait.       Assessment: COVID-19 virus infection - Plan: molnupiravir EUA (LAGEVRIO) 200 MG CAPS capsule  Body aches - Plan: POC COVID-19  Sore throat - Plan: POC COVID-19   Plan: Positive Covid test in office. Molnupiravir prescribed due to chronic health conditions.  Suggested symptomatic OTC remedies. Counseling on quarantine and isolation.

## 2021-11-21 ENCOUNTER — Inpatient Hospital Stay
Admission: RE | Admit: 2021-11-21 | Discharge: 2021-11-21 | Disposition: A | Discharge status: Home / Self Care | Payer: 59 | Source: Ambulatory Visit | Attending: Internal Medicine | Admitting: Internal Medicine

## 2021-11-23 ENCOUNTER — Telehealth: Payer: Self-pay | Admitting: *Deleted

## 2021-11-23 ENCOUNTER — Telehealth: Payer: Self-pay | Admitting: Internal Medicine

## 2021-11-23 ENCOUNTER — Other Ambulatory Visit: Payer: Self-pay | Admitting: Internal Medicine

## 2021-11-23 MED ORDER — RIZATRIPTAN BENZOATE 10 MG PO TABS: 10.0000 mg | ORAL_TABLET | ORAL | 1 refills | Status: DC | PRN

## 2021-11-23 NOTE — Telephone Encounter (Signed)
Ok done erx 

## 2021-11-23 NOTE — Telephone Encounter (Signed)
Pt states that after her visit on 9.5.23 she was given molnupiravir EUA (LAGEVRIO) 200 MG CAPS RX but isn't feeling any better. Also states she just started her menstrual cycle.   Told the pt to continue taking the medication and stray hydrated over the weeekend.   Pt is also concerned that her rizatriptan (MAXALT) 10 MG tablet RX is going to expire

## 2021-11-23 NOTE — Telephone Encounter (Signed)
Message sent in provider absence   Patient is requesting a refill of her rizatriptan. Please advise

## 2021-11-29 ENCOUNTER — Telehealth (INDEPENDENT_AMBULATORY_CARE_PROVIDER_SITE_OTHER): Payer: 59 | Admitting: Nurse Practitioner

## 2021-11-29 ENCOUNTER — Ambulatory Visit (INDEPENDENT_AMBULATORY_CARE_PROVIDER_SITE_OTHER): Payer: 59

## 2021-11-29 DIAGNOSIS — R0602 Shortness of breath: Secondary | ICD-10-CM

## 2021-11-29 LAB — COMPREHENSIVE METABOLIC PANEL
ALT: 23 U/L (ref 0–35)
AST: 22 U/L (ref 0–37)
Albumin: 3.7 g/dL (ref 3.5–5.2)
Alkaline Phosphatase: 35 U/L — ABNORMAL LOW (ref 39–117)
BUN: 15 mg/dL (ref 6–23)
CO2: 28 mEq/L (ref 19–32)
Calcium: 9 mg/dL (ref 8.4–10.5)
Chloride: 104 mEq/L (ref 96–112)
Creatinine, Ser: 0.6 mg/dL (ref 0.40–1.20)
GFR: 109.13 mL/min (ref >60.00)
Glucose, Bld: 101 mg/dL — ABNORMAL HIGH (ref 70–99)
Potassium: 4.2 mEq/L (ref 3.5–5.1)
Sodium: 138 mEq/L (ref 135–145)
Total Bilirubin: 1.1 mg/dL (ref 0.2–1.2)
Total Protein: 6.7 g/dL (ref 6.0–8.3)

## 2021-11-29 LAB — CBC
HCT: 38.4 % (ref 36.0–46.0)
Hemoglobin: 12.6 g/dL (ref 12.0–15.0)
MCHC: 32.8 g/dL (ref 30.0–36.0)
MCV: 98.2 fl (ref 78.0–100.0)
Platelets: 327 10*3/uL (ref 150.0–400.0)
RBC: 3.91 Mil/uL (ref 3.87–5.11)
RDW: 12.6 % (ref 11.5–15.5)
WBC: 6.4 10*3/uL (ref 4.0–10.5)

## 2021-11-29 NOTE — Progress Notes (Signed)
Established Patient Office Visit  An audio-only tele-health visit was completed today for this patient. I connected with  Bridget [name] on 11/29/21 utilizing audio-only technology and verified that I am speaking with the correct person using two identifiers. The patient was located at their home, and I was located at the office of [place] at [facility] during the encounter. I discussed the limitations of evaluation and management by telemedicine. The patient expressed understanding and agreed to proceed.     Subjective   Patient ID: Bridget [name], female    DOB: Apr 21, 1975  Age: 46 y.o. MRN: [id number]  Chief Complaint  Patient presents with   Difficulty with breathing    Patient diagnosed with COVID-19 9 days ago, symptoms started 11 days ago.  She has completed course of molnupiravir.  She reports that she continues to have congestion with difficulty taking a deep breath.  She feels a tightness in her mid upper back as well.  She has been taking Mucinex.  She does have a history of pulmonary embolism.  Is on aspirin 81 mg daily, was on Xarelto in the past.  Other medical history as listed below.  Past Medical History:  Diagnosis Date   Constipation    Edema    Headache(784.0)    Morbid obesity ([place])    Pulmonary embolism ([place])       Review of Systems  Constitutional:  Positive for chills and malaise/fatigue. Negative for fever.  Respiratory:  Positive for shortness of breath (difficulty with taking a deep breath, even at rest). Negative for cough.   Cardiovascular:  Positive for chest pain (Pain is more in the upper back).  Genitourinary:  Positive for flank pain.  Musculoskeletal:  Positive for back pain.      Objective:     There were no vitals taken for this visit. BP Readings from Last 3 Encounters:  11/20/21 132/84  10/24/21 118/68  10/04/21 129/75   Wt Readings from Last 3 Encounters:  11/20/21 288 lb (130.6 kg)  10/24/21 288 lb 12.8 oz  (131 kg)  04/03/21 292 lb 6.4 oz (132.6 kg)      Physical Exam Comprehensive physical exam not completed today as office visit was conducted remotely.  Patient sounded well over the phone, she did not have to stop talking in order to breathe.  Patient was alert and oriented, and appeared to have appropriate judgment.   No results found for any visits on 11/29/21.    The 10-year ASCVD risk score (Arnett DK, et al., 2019) is: 2.8%    Assessment & Plan:   Problem List Items Addressed This Visit       Other   Shortness of breath - Primary    Etiology unclear, very well could be related to her most recent COVID-19 infection and this may not have completely cleared as of yet.  However with her history of previous pulmonary embolism and the fact that she is experiencing more tightness in her back with difficulty taking a deep breath I am concerned that there may be a likelihood of recurrent pulmonary embolism or aortic dissection.  I highly recommended patient proceed to the emergency department for evaluation, she was told her symptoms could be caused by a life threatning etiology and that I feel she needs emergent in-person evlauation. She is on the fence as to whether or not she is willing to go to the ER.  We did discuss risk of not being evaluated in the  emergency department including misdiagnosis, delaying diagnosis, which could result in worsening outcomes up to death.  She reports understanding.  In the event that she does not go to the emergency department I will order chest x-ray, D-dimer, CBC, and CMP for further evaluation.  Patient reports she most likely will go to the emergency department.      Relevant Orders   D-Dimer, Quantitative   CBC   Comprehensive metabolic panel   DG Chest 2 View    Return if symptoms worsen or fail to improve.  Total time spent with telephone was 19 minutes and 15 seconds.   Ailene Ards, NP

## 2021-11-29 NOTE — Assessment & Plan Note (Addendum)
Etiology unclear, very well could be related to her most recent COVID-19 infection and this may not have completely cleared as of yet.  However with her history of previous pulmonary embolism and the fact that she is experiencing more tightness in her back with difficulty taking a deep breath I am concerned that there may be a likelihood of recurrent pulmonary embolism or aortic dissection.  I highly recommended patient proceed to the emergency department for evaluation, she was told her symptoms could be caused by a life threatning etiology and that I feel she needs emergent in-person evlauation. She is on the fence as to whether or not she is willing to go to the ER.  We did discuss risk of not being evaluated in the emergency department including misdiagnosis, delaying diagnosis, which could result in worsening outcomes up to death.  She reports understanding.  In the event that she does not go to the emergency department I will order chest x-ray, D-dimer, CBC, and CMP for further evaluation.  Patient reports she most likely will go to the emergency department.

## 2021-11-30 ENCOUNTER — Encounter (HOSPITAL_BASED_OUTPATIENT_CLINIC_OR_DEPARTMENT_OTHER): Payer: Self-pay | Admitting: Emergency Medicine

## 2021-11-30 ENCOUNTER — Other Ambulatory Visit: Payer: Self-pay

## 2021-11-30 ENCOUNTER — Emergency Department (HOSPITAL_BASED_OUTPATIENT_CLINIC_OR_DEPARTMENT_OTHER)
Admission: EM | Admit: 2021-11-30 | Discharge: 2021-11-30 | Disposition: A | Discharge status: Home / Self Care | Payer: 59 | Attending: Emergency Medicine | Admitting: Emergency Medicine

## 2021-11-30 ENCOUNTER — Emergency Department (HOSPITAL_BASED_OUTPATIENT_CLINIC_OR_DEPARTMENT_OTHER): Payer: 59

## 2021-11-30 ENCOUNTER — Telehealth: Payer: Self-pay | Admitting: Internal Medicine

## 2021-11-30 DIAGNOSIS — M549 Dorsalgia, unspecified: Secondary | ICD-10-CM | POA: Insufficient documentation

## 2021-11-30 DIAGNOSIS — R059 Cough, unspecified: Secondary | ICD-10-CM | POA: Insufficient documentation

## 2021-11-30 DIAGNOSIS — Z7982 Long term (current) use of aspirin: Secondary | ICD-10-CM | POA: Diagnosis not present

## 2021-11-30 DIAGNOSIS — R0789 Other chest pain: Secondary | ICD-10-CM | POA: Diagnosis not present

## 2021-11-30 DIAGNOSIS — R0602 Shortness of breath: Secondary | ICD-10-CM | POA: Diagnosis present

## 2021-11-30 LAB — COMPREHENSIVE METABOLIC PANEL
ALT: 23 U/L (ref 0–44)
AST: 27 U/L (ref 15–41)
Albumin: 3.3 g/dL — ABNORMAL LOW (ref 3.5–5.0)
Alkaline Phosphatase: 32 U/L — ABNORMAL LOW (ref 38–126)
Anion gap: 6 (ref 5–15)
BUN: 13 mg/dL (ref 6–20)
CO2: 26 mmol/L (ref 22–32)
Calcium: 8.6 mg/dL — ABNORMAL LOW (ref 8.9–10.3)
Chloride: 106 mmol/L (ref 98–111)
Creatinine, Ser: 0.59 mg/dL (ref 0.44–1.00)
GFR, Estimated: >60 mL/min (ref >60)
Glucose, Bld: 116 mg/dL — ABNORMAL HIGH (ref 70–99)
Potassium: 4.5 mmol/L (ref 3.5–5.1)
Sodium: 138 mmol/L (ref 135–145)
Total Bilirubin: 1.6 mg/dL — ABNORMAL HIGH (ref 0.3–1.2)
Total Protein: 6.3 g/dL — ABNORMAL LOW (ref 6.5–8.1)

## 2021-11-30 LAB — URINALYSIS, ROUTINE W REFLEX MICROSCOPIC
Bilirubin Urine: NEGATIVE
Glucose, UA: NEGATIVE mg/dL
Hgb urine dipstick: NEGATIVE
Ketones, ur: NEGATIVE mg/dL
Leukocytes,Ua: NEGATIVE
Nitrite: NEGATIVE
Protein, ur: NEGATIVE mg/dL
Specific Gravity, Urine: 1.015 (ref 1.005–1.030)
pH: 7 (ref 5.0–8.0)

## 2021-11-30 LAB — CBC WITH DIFFERENTIAL/PLATELET
Abs Immature Granulocytes: 0.01 10*3/uL (ref 0.00–0.07)
Basophils Absolute: 0 10*3/uL (ref 0.0–0.1)
Basophils Relative: 1 %
Eosinophils Absolute: 0.2 10*3/uL (ref 0.0–0.5)
Eosinophils Relative: 3 %
HCT: 35.9 % — ABNORMAL LOW (ref 36.0–46.0)
Hemoglobin: 12.2 g/dL (ref 12.0–15.0)
Immature Granulocytes: 0 %
Lymphocytes Relative: 27 %
Lymphs Abs: 1.6 10*3/uL (ref 0.7–4.0)
MCH: 32.7 pg (ref 26.0–34.0)
MCHC: 34 g/dL (ref 30.0–36.0)
MCV: 96.2 fL (ref 80.0–100.0)
Monocytes Absolute: 0.4 10*3/uL (ref 0.1–1.0)
Monocytes Relative: 7 %
Neutro Abs: 3.7 10*3/uL (ref 1.7–7.7)
Neutrophils Relative %: 62 %
Platelets: 311 10*3/uL (ref 150–400)
RBC: 3.73 MIL/uL — ABNORMAL LOW (ref 3.87–5.11)
RDW: 11.9 % (ref 11.5–15.5)
WBC: 6 10*3/uL (ref 4.0–10.5)
nRBC: 0 % (ref 0.0–0.2)

## 2021-11-30 LAB — TROPONIN I (HIGH SENSITIVITY): Troponin I (High Sensitivity): 3 ng/L (ref <18)

## 2021-11-30 LAB — D-DIMER, QUANTITATIVE: D-Dimer, Quant: 1.13 mcg/mL FEU — ABNORMAL HIGH (ref <0.50)

## 2021-11-30 LAB — PREGNANCY, URINE: Preg Test, Ur: NEGATIVE

## 2021-11-30 MED ORDER — IOHEXOL 350 MG/ML SOLN
100.0000 mL | Freq: Once | INTRAVENOUS | Status: AC | PRN
  Administered 2021-11-30: 100 mL via INTRAVENOUS

## 2021-11-30 MED ORDER — CYCLOBENZAPRINE HCL 10 MG PO TABS: 10.0000 mg | ORAL_TABLET | Freq: Two times a day (BID) | ORAL | 0 refills | Status: DC | PRN

## 2021-11-30 MED ORDER — LIDOCAINE 5 % EX PTCH: 1.0000 | MEDICATED_PATCH | CUTANEOUS | 0 refills | Status: DC

## 2021-11-30 NOTE — Telephone Encounter (Signed)
Received a call from on call nurse.  Ms Viernes called in with reports of sob and concerns regarding elevated d dimer - viewed her lab results.  Nurse had already triaged her and she was on her way to ER.  I called pt and discussed labs with her.  Was seen yesterday. Persistent sob and feelings of not being able to get a good breath. Recent covid infection.  History of question of PE.  Discussed her symptoms and labs and agree with need for further evaluation.  She is on her way to [facility].  I called ER and informed them of above information.

## 2021-11-30 NOTE — Discharge Instructions (Signed)
Your history, exam and work-up today are consistent with pain from your COVID-19 infection as well as suspected chest wall pain and spasm.  The CT scan did not show pneumonia, collapsed lung, or blood clots and was otherwise reassuring.  Your labs were also reassuring as we discussed.  Please use the muscle relaxant and Lidoderm patches to help with the chest wall discomfort and please rest and stay hydrated.  Please follow-up with your primary team.  Any symptoms change or worsen acutely, please turn to the nearest emergency department.

## 2021-11-30 NOTE — ED Notes (Signed)
ED Provider at bedside. 

## 2021-11-30 NOTE — ED Triage Notes (Addendum)
Pt c/o shortness of breath. Pt tx for Covid last week. Pt denies cough. Received call from PMD who states pt had elevated D dimer in office and is concerned for PE.

## 2021-11-30 NOTE — Telephone Encounter (Signed)
Noted! Thank you

## 2021-11-30 NOTE — ED Provider Notes (Signed)
[facility]ERGENC[facility] DEPARTMENT Provider Note   CSN: [id number] Arrival date & time: 11/30/21  0641     History  Chief Complaint  Patient presents with   Shortness of Breath    Bridget [name] is a 46 y.o. female.  The history is provided by the patient and medical records. No language interpreter was used.  Shortness of Breath Severity:  Mild Onset quality:  Gradual Duration:  3 days Timing:  Constant Progression:  Waxing and waning Chronicity:  New Context: URI (covid + for last week)   Relieved by:  Nothing Worsened by:  Deep breathing and coughing Ineffective treatments:  None tried Associated symptoms: chest pain (left lateral and back) and cough   Associated symptoms: no abdominal pain, no fever, no headaches, no neck pain, no rash, no vomiting and no wheezing        Home Medications Prior to Admission medications   Medication Sig Start Date End Date Taking? Authorizing Provider  acetaminophen (TYLENOL) 500 MG tablet Take 1,000 mg by mouth every 6 (six) hours as needed for mild pain.    [provider]  albuterol (VENTOLIN HFA) 108 (90 Base) MCG/ACT inhaler Inhale 1-2 puffs into the lungs every 6 (six) hours as needed for wheezing or shortness of breath. 06/21/20   Marrian Salvage, FNP  aspirin EC 81 MG tablet Take 1 tablet (81 mg total) by mouth daily. 10/24/21 10/24/22  Plotnikov, Evie Lacks, MD  cetirizine (ZYRTEC) 10 MG tablet Take 10 mg by mouth daily as needed for allergies.    [provider]  Cholecalciferol (VITAMIN D3) 125 MCG (5000 UT) CAPS Take 1 capsule by mouth daily.    [provider]  clonazePAM (KLONOPIN) 0.5 MG disintegrating tablet Take 1-2 tablets (0.5-1 mg total) by mouth 2 (two) times daily as needed (panic attacks). 09/29/19   Plotnikov, Evie Lacks, MD  Cyanocobalamin (VITAMIN B-12) 1000 MCG SUBL Place 1 tablet (1,000 mcg total) under the tongue daily. 08/04/19   Plotnikov, Evie Lacks, MD  fluticasone  (FLONASE) 50 MCG/ACT nasal spray Place 2 sprays into both nostrils daily. 05/30/21   Hoyt Koch, MD  megestrol (MEGACE) 40 MG tablet Take 2 tablets (80 mg total) by mouth daily. Can increase to two tablets twice a day in the event of heavy bleeding Patient not taking: Reported on 11/29/2021 02/21/21   Osborne Oman, MD  Multiple Vitamin (MULTIVITAMIN) capsule Take 1 capsule by mouth daily.    [provider]  ondansetron (ZOFRAN) 4 MG tablet Take 1 tablet (4 mg total) by mouth every 8 (eight) hours as needed for nausea or vomiting. 10/10/20   Plotnikov, Evie Lacks, MD  pantoprazole (PROTONIX) 40 MG tablet Take 1 tablet (40 mg total) by mouth daily. 11/06/20   Plotnikov, Evie Lacks, MD  potassium chloride SA (KLOR-CON M) 20 MEQ tablet Take 1 tablet (20 mEq total) by mouth 2 (two) times daily. 10/25/21   Plotnikov, Evie Lacks, MD  rizatriptan (MAXALT) 10 MG tablet TAKE 1 TABLET BY MOUTH ONCE AS NEEDED FOR UP TO 1 DOSE FOR MIGRAINE. MAY REPEAT IN 2 HOURS IF NEEDED 11/26/21   Plotnikov, Evie Lacks, MD  rizatriptan (MAXALT) 10 MG tablet Take 1 tablet (10 mg total) by mouth as needed for migraine. May repeat in 2 hours if needed 11/23/21   Biagio Borg, MD  triamterene-hydrochlorothiazide [facility]) 37.5-25 MG tablet Take 1 tablet by mouth once daily 11/05/21   Plotnikov, Evie Lacks, MD  Allergies    Patient has no known allergies.    Review of Systems   Review of Systems  Constitutional:  Positive for chills and fatigue. Negative for fever.  HENT:  Negative for congestion.   Respiratory:  Positive for cough, chest tightness and shortness of breath. Negative for wheezing and stridor.   Cardiovascular:  Positive for chest pain (left lateral and back). Negative for palpitations and leg swelling.  Gastrointestinal:  Negative for abdominal pain, constipation, diarrhea, nausea and vomiting.  Genitourinary:  Positive for flank pain (upper flank/back pain). Negative for dysuria and  frequency.  Musculoskeletal:  Positive for back pain. Negative for neck pain and neck stiffness.  Skin:  Negative for rash and wound.  Neurological:  Negative for dizziness, light-headedness and headaches.  Psychiatric/Behavioral:  Negative for agitation.   All other systems reviewed and are negative.   Physical Exam Updated Vital Signs BP 132/75   Pulse 65   Temp 98 F (36.7 C) (Oral)   Ht '5\' 4"'$  (1.626 m)   Wt 130.6 kg   LMP 11/21/2021   SpO2 99%   BMI 49.44 kg/m  Physical Exam Vitals and nursing note reviewed.  Constitutional:      General: She is not in acute distress.    Appearance: She is well-developed. She is not ill-appearing, toxic-appearing or diaphoretic.  HENT:     Head: Normocephalic and atraumatic.     Mouth/Throat:     Mouth: Mucous membranes are moist.     Pharynx: No oropharyngeal exudate or posterior oropharyngeal erythema.  Eyes:     Extraocular Movements: Extraocular movements intact.     Conjunctiva/sclera: Conjunctivae normal.     Pupils: Pupils are equal, round, and reactive to light.  Cardiovascular:     Rate and Rhythm: Normal rate and regular rhythm.     Heart sounds: No murmur heard. Pulmonary:     Effort: Pulmonary effort is normal. No respiratory distress.     Breath sounds: Normal breath sounds. No decreased breath sounds, wheezing, rhonchi or rales.  Chest:     Chest wall: No tenderness.  Abdominal:     Palpations: Abdomen is soft.     Tenderness: There is no abdominal tenderness. There is no right CVA tenderness, left CVA tenderness, guarding or rebound.  Musculoskeletal:        General: Tenderness (back tenderness) present. No swelling.     Cervical back: Neck supple.     Thoracic back: Tenderness present.       Back:     Right lower leg: No tenderness. No edema.     Left lower leg: No tenderness. No edema.  Skin:    General: Skin is warm and dry.     Capillary Refill: Capillary refill takes less than 2 seconds.     Findings: No  erythema.  Neurological:     General: No focal deficit present.     Mental Status: She is alert.  Psychiatric:        Mood and Affect: Mood normal.     ED Results / Procedures / Treatments   Labs (all labs ordered are listed, but only abnormal results are displayed) Labs Reviewed  CBC WITH DIFFERENTIAL/PLATELET - Abnormal; Notable for the following components:      Result Value   RBC 3.73 (*)    HCT 35.9 (*)    All other components within normal limits  COMPREHENSIVE METABOLIC PANEL - Abnormal; Notable for the following components:   Glucose, Bld 116 (*)  Calcium 8.6 (*)    Total Protein 6.3 (*)    Albumin 3.3 (*)    Alkaline Phosphatase 32 (*)    Total Bilirubin 1.6 (*)    All other components within normal limits  URINALYSIS, ROUTINE W REFLEX MICROSCOPIC - Abnormal; Notable for the following components:   APPearance HAZY (*)    All other components within normal limits  URINE CULTURE  PREGNANCY, URINE  TROPONIN I (HIGH SENSITIVITY)  TROPONIN I (HIGH SENSITIVITY)    EKG EKG Interpretation  Date/Time:  Friday November 30 2021 07:03:23 EDT Ventricular Rate:  67 PR Interval:  178 QRS Duration: 79 QT Interval:  373 QTC Calculation: 394 R Axis:   41 Text Interpretation: Sinus rhythm Abnormal R-wave progression, early transition when compared to prior, similar appearance No STEMI Confirmed by Antony Blackbird [phone]) on 11/30/2021 7:37:05 AM  Radiology DG Chest 2 View  Result Date: 11/30/2021 CLINICAL DATA:  shortness of breath EXAM: CHEST - 2 VIEW COMPARISON:  Radiograph 10/04/2021. FINDINGS: The heart size and mediastinal contours are within normal limits.No focal airspace disease. No pleural effusion or pneumothorax.Unchanged thoracolumbar scoliosis. No acute osseous abnormality. IMPRESSION: No evidence of acute cardiopulmonary disease. Electronically Signed   By: Maurine Simmering M.D.   On: 11/30/2021 08:27   CT Angio Chest PE W and/or Wo Contrast  Result Date:  11/30/2021 CLINICAL DATA:  Pulmonary embolism (PE) suspected, positive D-dimer History of PE, COVID-positive last week, pleuritic short of breath and tightness. Positive dimer at PCP yesterday. EXAM: CT ANGIOGRAPHY CHEST WITH CONTRAST TECHNIQUE: Multidetector CT imaging of the chest was performed using the standard protocol during bolus administration of intravenous contrast. Multiplanar CT image reconstructions and MIPs were obtained to evaluate the vascular anatomy. RADIATION DOSE REDUCTION: This exam was performed according to the departmental dose-optimization program which includes automated exposure control, adjustment of the mA and/or kV according to patient size and/or use of iterative reconstruction technique. CONTRAST:  13m OMNIPAQUE IOHEXOL 350 MG/ML SOLN COMPARISON:  CT 10/04/2021. FINDINGS: Cardiovascular: Satisfactory opacification of the pulmonary arteries to the segmental level. No evidence of pulmonary embolism. Normal cardiac size.No pericardial disease. Minimal atherosclerosis of the thoracic aortic arch. Mediastinum/Nodes: No lymphadenopathy. The thyroid is unremarkable. Esophagus is unremarkable. Lungs/Pleura: Lungs are clear. No pleural effusion or pneumothorax. Upper Abdomen: Postsurgical changes of the stomach. No acute findings. Musculoskeletal: Unchanged scoliotic curvature of the spine. No acute osseous abnormality. No suspicious osseous lesion. Review of the MIP images confirms the above findings. IMPRESSION: No pulmonary embolism or other acute findings in the chest. Electronically Signed   By: JMaurine SimmeringM.D.   On: 11/30/2021 08:26    Procedures Procedures    Medications Ordered in ED Medications  iohexol (OMNIPAQUE) 350 MG/ML injection 100 mL (100 mLs Intravenous Contrast Given 11/30/21 0753)    ED Course/ Medical Decision Making/ A&P                           Medical Decision Making Amount and/or Complexity of Data Reviewed Labs: ordered. Radiology:  ordered.  Risk Prescription drug management.    NLACHELE LIEVANOSis a 46y.o. 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  According to patient, she has had COVID for the  last week and is continue to have shortness of breath and coughing.  She reports last several days it is worsened prompting her to see her doctor who did a D-dimer that was positive.  Patient was sent here to get a CT scan to rule out pulmonary embolism versus pain related to her COVID-19.  She denies nausea, vomiting, constipation, or diarrhea.  She denies any anterior chest pain but she does report some chest tightness and pressure that does wrap around her chest from the left side to the back.  She denies any urinary changes but thinks her kidneys are hurting on her upper back as well.  Denies any abdominal pain otherwise.  Denies any leg pain or leg swelling.  Reports she is not on blood thinners any longer after the previous PE.  On exam, lungs clear and chest was nontender.  Left lateral chest and back was slightly tender to palpation but there is no midline tenderness.  Lower back nontender.  Abdomen nontender.  Normal bowel sounds.  Good pulses in all extremities.  Vital signs reassuring on my evaluation.  EKG does not show STEMI.  Given the patient's positive D-dimer yesterday, her previous PE, and her COVID diagnosis from this past week, I do agree it is reasonable to rule out pulmonary embolism with a CT scan.  We discussed that this could just be pain related to her COVID-19 if the scan is reassuring and patient agrees to check it.  She wants to hold on pain medicine initially until work-up is completed.  We will also check urinalysis given her concern  that she is having bilateral flank pain in the back that could be from her kidneys.  We will get urinalysis and other labs.  Anticipate reassessment after work-up to determine disposition.  Patient CT scan does not show pulmonary embolism.  No other abnormality seen.  Labs also reassuring.  Clinically I suspect patient has chest wall discomfort and likely intercostal spasm due to the coughing and recent COVID infection.  Patient be given Lidoderm patches and muscle relaxant and patient agrees with discharge home given her reassuring vital signs and work-up.  Suspect her positive dimer was due to the coronavirus infection last week.  Patient agrees with plan of care and understand return precautions.  Patient discharged in good condition.         Final Clinical Impression(s) / ED Diagnoses Final diagnoses:  Other chest pain  Acute back pain, unspecified back location, unspecified back pain laterality  SOB (shortness of breath)    Rx / DC Orders ED Discharge Orders          Ordered    lidocaine (LIDODERM) 5 %  Every 24 hours        11/30/21 1013    cyclobenzaprine (FLEXERIL) 10 MG tablet  2 times daily PRN        11/30/21 1013           Clinical Impression: 1. Other chest pain   2. Acute back pain, unspecified back location, unspecified back pain laterality   3. SOB (shortness of breath)     Disposition: Discharge  Condition: Good  I have discussed the results, Dx and Tx plan with the pt(& family if present). He/she/they expressed understanding and agree(s) with the plan. Discharge instructions discussed at great length. Strict return precautions discussed and pt &/or family have verbalized understanding of the instructions. No further questions at time of discharge.    New Prescriptions   CYCLOBENZAPRINE (FLEXERIL)  10 MG TABLET    Take 1 tablet (10 mg total) by mouth 2 (two) times daily as needed for muscle spasms.   LIDOCAINE (LIDODERM) 5 %    Place 1 patch onto  the skin daily. Remove & Discard patch within 12 hours or as directed by MD    Follow Up: Plotnikov, Evie Lacks, MD [place] [address] [phone]     [place] [address] [id number] [place] [facility] [place]       Lacye Mccarn, Gwenyth Allegra, MD 11/30/21 1018

## 2021-11-30 NOTE — ED Notes (Signed)
Patient transported to CT 

## 2021-12-01 LAB — URINE CULTURE: Culture: >=100000 — AB

## 2021-12-02 ENCOUNTER — Telehealth (HOSPITAL_BASED_OUTPATIENT_CLINIC_OR_DEPARTMENT_OTHER): Payer: Self-pay | Admitting: *Deleted

## 2021-12-02 NOTE — Telephone Encounter (Signed)
Post ED Visit - Positive Culture Follow-up: Unsuccessful Patient Follow-up  Culture assessed and recommendations reviewed by:  '[x]'$  Bari Edward, Pharm.D. '[]'$  Heide Guile, Pharm.D., BCPS AQ-ID '[]'$  Parks Neptune, Pharm.D., BCPS '[]'$  Alycia Rossetti, Pharm.D., BCPS '[]'$  [place], Pharm.D., BCPS, AAHIVP '[]'$  Legrand Como, Pharm.D., BCPS, AAHIVP '[]'$  Wynell Balloon, PharmD '[]'$  Vincenza Hews, PharmD, BCPS  Positive urine culture  '[x]'$  Patient discharged without antimicrobial prescription and treatment may now be indicated   Call for symptom check. If + urinary sx (worsening flank pain)  Start Keflex '500mg'$  TID x 5 days  Unable to contact patient , letter will be sent to address on file  Rosie Fate 12/02/2021, 1:53 PM

## 2021-12-02 NOTE — Progress Notes (Signed)
ED Antimicrobial Stewardship Positive Culture Follow Up   Bridget [name] is an 46 y.o. female who presented to [facility] on 11/30/2021 with a chief complaint of  Chief Complaint  Patient presents with   Shortness of Breath    Recent Results (from the past 720 hour(s))  Urine Culture     Status: Abnormal   Collection Time: 11/30/21  8:41 AM   Specimen: Urine, Clean Catch  Result Value Ref Range Status   Specimen Description   Final    URINE, CLEAN CATCH Performed at [facility], [address] [place], [place] [address]    Special Requests   Final    NONE Performed at [facility], [address] [place], [place] [address]    Culture (A)  Final    >=100,000 COLONIES/mL STREPTOCOCCUS AGALACTIAE TESTING AGAINST S. AGALACTIAE NOT ROUTINELY PERFORMED DUE TO PREDICTABILITY OF AMP/PEN/VAN SUSCEPTIBILITY. Performed at [facility], [place] [address], [place], [place] [address]    Report Status 12/01/2021 FINAL  Final    Plan: Call for sx check, if positive for urinary sx give Keflex 500 mg TID x 5d  ED Provider: Suella Broad, PA-C   Bertis Ruddy 12/02/2021, 10:59 AM Clinical Pharmacist Monday - Friday phone -  [phone] Saturday - Sunday phone - [phone]

## 2021-12-24 ENCOUNTER — Other Ambulatory Visit: Payer: 59

## 2022-01-04 ENCOUNTER — Other Ambulatory Visit: Payer: 59

## 2022-02-04 ENCOUNTER — Inpatient Hospital Stay: Admission: RE | Admit: 2022-02-04 | Payer: 59 | Source: Ambulatory Visit

## 2022-02-05 ENCOUNTER — Other Ambulatory Visit: Payer: Self-pay | Admitting: Internal Medicine

## 2022-04-19 ENCOUNTER — Encounter: Payer: Self-pay | Admitting: Internal Medicine

## 2022-05-13 DIAGNOSIS — Z903 Acquired absence of stomach [part of]: Secondary | ICD-10-CM | POA: Insufficient documentation

## 2022-05-22 ENCOUNTER — Other Ambulatory Visit: Payer: Self-pay | Admitting: Internal Medicine

## 2022-06-25 ENCOUNTER — Encounter: Payer: Self-pay | Admitting: Internal Medicine

## 2022-06-25 ENCOUNTER — Ambulatory Visit (INDEPENDENT_AMBULATORY_CARE_PROVIDER_SITE_OTHER): Payer: 59 | Admitting: Internal Medicine

## 2022-06-25 VITALS — BP 130/84 | HR 72 | Temp 98.4°F | Ht 64.0 in | Wt 298.0 lb

## 2022-06-25 DIAGNOSIS — E538 Deficiency of other specified B group vitamins: Secondary | ICD-10-CM | POA: Diagnosis not present

## 2022-06-25 DIAGNOSIS — E559 Vitamin D deficiency, unspecified: Secondary | ICD-10-CM

## 2022-06-25 DIAGNOSIS — R109 Unspecified abdominal pain: Secondary | ICD-10-CM | POA: Diagnosis not present

## 2022-06-25 DIAGNOSIS — I10 Essential (primary) hypertension: Secondary | ICD-10-CM | POA: Diagnosis not present

## 2022-06-25 NOTE — Assessment & Plan Note (Signed)
On low carb diet now Topamax, Phentermine intolerant

## 2022-06-25 NOTE — Assessment & Plan Note (Signed)
On Vit D 

## 2022-06-25 NOTE — Assessment & Plan Note (Signed)
On Maxzide 

## 2022-06-25 NOTE — Assessment & Plan Note (Signed)
On B12 

## 2022-06-25 NOTE — Assessment & Plan Note (Signed)
L side - new Will check UA

## 2022-06-25 NOTE — Progress Notes (Signed)
Subjective:  Patient ID: Bridget [name], female    DOB: 07/24/1975  Age: 47 y.o. MRN: [id number]  CC: Headache (Edema in LOWER EXTREMITIES,Left sided Flank pain )   HPI Bridget [name] presents for wt gain, hyperglycemia, L flank pain  Outpatient Medications Prior to Visit  Medication Sig Dispense Refill   acetaminophen (TYLENOL) 500 MG tablet Take 1,000 mg by mouth every 6 (six) hours as needed for mild pain.     albuterol (VENTOLIN HFA) 108 (90 Base) MCG/ACT inhaler Inhale 1-2 puffs into the lungs every 6 (six) hours as needed for wheezing or shortness of breath. 18 g 0   aspirin EC 81 MG tablet Take 1 tablet (81 mg total) by mouth daily. 100 tablet 3   cetirizine (ZYRTEC) 10 MG tablet Take 10 mg by mouth daily as needed for allergies.     Cholecalciferol (VITAMIN D3) 125 MCG (5000 UT) CAPS Take 1 capsule by mouth daily.     clonazePAM (KLONOPIN) 0.5 MG disintegrating tablet Take 1-2 tablets (0.5-1 mg total) by mouth 2 (two) times daily as needed (panic attacks). 60 tablet 1   Cyanocobalamin (VITAMIN B-12) 1000 MCG SUBL Place 1 tablet (1,000 mcg total) under the tongue daily. 100 tablet 3   cyclobenzaprine (FLEXERIL) 10 MG tablet Take 1 tablet (10 mg total) by mouth 2 (two) times daily as needed for muscle spasms. 20 tablet 0   fluticasone (FLONASE) 50 MCG/ACT nasal spray Place 2 sprays into both nostrils daily. 16 g 6   lidocaine (LIDODERM) 5 % Place 1 patch onto the skin daily. Remove & Discard patch within 12 hours or as directed by MD 30 patch 0   Multiple Vitamin (MULTIVITAMIN) capsule Take 1 capsule by mouth daily.     ondansetron (ZOFRAN) 4 MG tablet Take 1 tablet (4 mg total) by mouth every 8 (eight) hours as needed for nausea or vomiting. 24 tablet 2   pantoprazole (PROTONIX) 40 MG tablet Take 1 tablet (40 mg total) by mouth daily. 90 tablet 01   potassium chloride SA (KLOR-CON M) 20 MEQ tablet Take 1 tablet by mouth twice daily 180 tablet 1   rivaroxaban (XARELTO) 20 MG TABS  tablet Take by mouth.     rizatriptan (MAXALT) 10 MG tablet TAKE 1 TABLET BY MOUTH ONCE AS NEEDED FOR UP TO 1 DOSE FOR MIGRAINE. MAY REPEAT IN 2 HOURS IF NEEDED 4 tablet 2   triamterene-hydrochlorothiazide (MAXZIDE-25) 37.5-25 MG tablet Take 1 tablet by mouth daily. Annual appt due in Aug must see provider for future refills 90 tablet 1   rizatriptan (MAXALT) 10 MG tablet Take 1 tablet (10 mg total) by mouth as needed for migraine. May repeat in 2 hours if needed 10 tablet 1   megestrol (MEGACE) 40 MG tablet Take 2 tablets (80 mg total) by mouth daily. Can increase to two tablets twice a day in the event of heavy bleeding (Patient not taking: Reported on 11/29/2021) 60 tablet 5   No facility-administered medications prior to visit.    ROS: Review of Systems  Constitutional:  Negative for activity change, appetite change, chills, fatigue and unexpected weight change.  HENT:  Negative for congestion, mouth sores and sinus pressure.   Eyes:  Negative for visual disturbance.  Respiratory:  Negative for cough and chest tightness.   Gastrointestinal:  Negative for abdominal pain and nausea.  Genitourinary:  Negative for difficulty urinating, frequency and vaginal pain.  Musculoskeletal:  Negative for back pain and gait problem.  Skin:  Negative for pallor and rash.  Neurological:  Negative for dizziness, tremors, weakness, numbness and headaches.  Psychiatric/Behavioral:  Negative for confusion and sleep disturbance.     Objective:  BP 130/84 (BP Location: Left Arm, Patient Position: Sitting, Cuff Size: Large)   Pulse 72   Temp 98.4 F (36.9 C) (Oral)   Ht 5\' 4"  (1.626 m)   Wt 298 lb (135.2 kg)   LMP 05/29/2022 (Exact Date)   SpO2 98%   BMI 51.15 kg/m   BP Readings from Last 3 Encounters:  06/25/22 130/84  11/30/21 117/80  11/20/21 132/84    Wt Readings from Last 3 Encounters:  06/25/22 298 lb (135.2 kg)  11/30/21 288 lb (130.6 kg)  11/20/21 288 lb (130.6 kg)    Physical  Exam Constitutional:      General: She is not in acute distress.    Appearance: She is well-developed. She is obese.  HENT:     Head: Normocephalic.     Right Ear: External ear normal.     Left Ear: External ear normal.     Nose: Nose normal.  Eyes:     General:        Right eye: No discharge.        Left eye: No discharge.     Conjunctiva/sclera: Conjunctivae normal.     Pupils: Pupils are equal, round, and reactive to light.  Neck:     Thyroid: No thyromegaly.     Vascular: No JVD.     Trachea: No tracheal deviation.  Cardiovascular:     Rate and Rhythm: Normal rate and regular rhythm.     Heart sounds: Normal heart sounds.  Pulmonary:     Effort: No respiratory distress.     Breath sounds: No stridor. No wheezing.  Abdominal:     General: Bowel sounds are normal. There is no distension.     Palpations: Abdomen is soft. There is no mass.     Tenderness: There is no abdominal tenderness. There is no guarding or rebound.  Musculoskeletal:        General: No tenderness.     Cervical back: Normal range of motion and neck supple. No rigidity.  Lymphadenopathy:     Cervical: No cervical adenopathy.  Skin:    Findings: No erythema or rash.  Neurological:     Cranial Nerves: No cranial nerve deficit.     Motor: No abnormal muscle tone.     Coordination: Coordination normal.     Deep Tendon Reflexes: Reflexes normal.  Psychiatric:        Behavior: Behavior normal.        Thought Content: Thought content normal.        Judgment: Judgment normal.   No edema  Lab Results  Component Value Date   WBC 6.0 11/30/2021   HGB 12.2 11/30/2021   HCT 35.9 (L) 11/30/2021   PLT 311 11/30/2021   GLUCOSE 116 (H) 11/30/2021   CHOL 147 10/10/2020   TRIG 109.0 10/10/2020   HDL 44.30 10/10/2020   LDLCALC 81 10/10/2020   ALT 23 11/30/2021   AST 27 11/30/2021   NA 138 11/30/2021   K 4.5 11/30/2021   CL 106 11/30/2021   CREATININE 0.59 11/30/2021   BUN 13 11/30/2021   CO2 26  11/30/2021   TSH 0.76 10/10/2020   HGBA1C 5.3 10/29/2017    CT Angio Chest PE W and/or Wo Contrast  Result Date: 11/30/2021 CLINICAL DATA:  Pulmonary embolism (PE) suspected, positive D-dimer  History of PE, COVID-positive last week, pleuritic short of breath and tightness. Positive dimer at PCP yesterday. EXAM: CT ANGIOGRAPHY CHEST WITH CONTRAST TECHNIQUE: Multidetector CT imaging of the chest was performed using the standard protocol during bolus administration of intravenous contrast. Multiplanar CT image reconstructions and MIPs were obtained to evaluate the vascular anatomy. RADIATION DOSE REDUCTION: This exam was performed according to the departmental dose-optimization program which includes automated exposure control, adjustment of the mA and/or kV according to patient size and/or use of iterative reconstruction technique. CONTRAST:  100mL OMNIPAQUE IOHEXOL 350 MG/ML SOLN COMPARISON:  CT 10/04/2021. FINDINGS: Cardiovascular: Satisfactory opacification of the pulmonary arteries to the segmental level. No evidence of pulmonary embolism. Normal cardiac size.No pericardial disease. Minimal atherosclerosis of the thoracic aortic arch. Mediastinum/Nodes: No lymphadenopathy. The thyroid is unremarkable. Esophagus is unremarkable. Lungs/Pleura: Lungs are clear. No pleural effusion or pneumothorax. Upper Abdomen: Postsurgical changes of the stomach. No acute findings. Musculoskeletal: Unchanged scoliotic curvature of the spine. No acute osseous abnormality. No suspicious osseous lesion. Review of the MIP images confirms the above findings. IMPRESSION: No pulmonary embolism or other acute findings in the chest. Electronically Signed   By: Caprice RenshawJacob  Kahn M.D.   On: 11/30/2021 08:26    Assessment & Plan:   Problem List Items Addressed This Visit       Cardiovascular and Mediastinum   HTN (hypertension)    On Maxzide      Relevant Medications   rivaroxaban (XARELTO) 20 MG TABS tablet   Other Relevant  Orders   Comprehensive metabolic panel     Other   B12 deficiency - Primary    On B12      Flank pain    L side - new Will check UA      Relevant Orders   Comprehensive metabolic panel   Urinalysis   Morbid (severe) obesity due to excess calories    On low carb diet now Topamax, Phentermine intolerant      Vitamin D deficiency    On Vit D         No orders of the defined types were placed in this encounter.     Follow-up: Return in about 4 weeks (around 07/23/2022) for a follow-up visit.  Sonda PrimesAlex Jakyia Gaccione, MD

## 2022-06-26 LAB — URINALYSIS
Hgb urine dipstick: NEGATIVE
Leukocytes,Ua: NEGATIVE
Nitrite: NEGATIVE
Specific Gravity, Urine: >=1.03 — AB (ref 1.000–1.030)
Total Protein, Urine: NEGATIVE
Urine Glucose: NEGATIVE
Urobilinogen, UA: 0.2 (ref 0.0–1.0)
pH: 5.5 (ref 5.0–8.0)

## 2022-06-26 LAB — COMPREHENSIVE METABOLIC PANEL
ALT: 15 U/L (ref 0–35)
AST: 18 U/L (ref 0–37)
Albumin: 4.2 g/dL (ref 3.5–5.2)
Alkaline Phosphatase: 40 U/L (ref 39–117)
BUN: 15 mg/dL (ref 6–23)
CO2: 27 mEq/L (ref 19–32)
Calcium: 9.5 mg/dL (ref 8.4–10.5)
Chloride: 101 mEq/L (ref 96–112)
Creatinine, Ser: 0.65 mg/dL (ref 0.40–1.20)
GFR: 106.61 mL/min (ref >60.00)
Glucose, Bld: 116 mg/dL — ABNORMAL HIGH (ref 70–99)
Potassium: 4.1 mEq/L (ref 3.5–5.1)
Sodium: 136 mEq/L (ref 135–145)
Total Bilirubin: 1.6 mg/dL — ABNORMAL HIGH (ref 0.2–1.2)
Total Protein: 7.2 g/dL (ref 6.0–8.3)

## 2022-06-28 ENCOUNTER — Telehealth: Payer: Self-pay | Admitting: Internal Medicine

## 2022-06-28 NOTE — Telephone Encounter (Signed)
Patient would like a call or message back about her recent labs. Best callback is [phone].

## 2022-06-28 NOTE — Telephone Encounter (Signed)
Pt had UA done on 06/26/22. No results was given on report.Bridget KitchenRaechel Chute

## 2022-07-22 ENCOUNTER — Telehealth: Payer: Self-pay | Admitting: Internal Medicine

## 2022-07-22 NOTE — Telephone Encounter (Signed)
Patient has faxed over [facility] paperwork 07/22/22. She would like a call back from Center to discuss. Best callback is [phone].

## 2022-07-22 NOTE — Telephone Encounter (Signed)
Called pt she states wanted to let me know she has faxed her FMLA this am. Inform pt we have not received yet,  but when forms are complete will let her know. Start date 07/10/22 " Migraines"...Bridget [name]

## 2022-07-22 NOTE — Telephone Encounter (Signed)
Rec'd FMLA fill out MD portion, and place in MD purple folder to complete.Marland KitchenRaechel Chute

## 2022-07-23 NOTE — Telephone Encounter (Signed)
Okay.  Thanks.

## 2022-07-23 NOTE — Telephone Encounter (Signed)
Faxed FMLA to [employer] claim # [id number] # [phone]. Sent pt msg via mychart w/status.Marland KitchenRaechel Chute

## 2022-07-24 NOTE — Telephone Encounter (Signed)
Faxed forms to [phone].Marland KitchenRaechel [name]

## 2022-08-05 ENCOUNTER — Encounter: Payer: Self-pay | Admitting: Internal Medicine

## 2022-08-05 ENCOUNTER — Other Ambulatory Visit (INDEPENDENT_AMBULATORY_CARE_PROVIDER_SITE_OTHER): Payer: 59

## 2022-08-05 ENCOUNTER — Telehealth (INDEPENDENT_AMBULATORY_CARE_PROVIDER_SITE_OTHER): Payer: 59 | Admitting: Internal Medicine

## 2022-08-05 DIAGNOSIS — R0789 Other chest pain: Secondary | ICD-10-CM

## 2022-08-05 DIAGNOSIS — R0602 Shortness of breath: Secondary | ICD-10-CM | POA: Diagnosis not present

## 2022-08-05 DIAGNOSIS — Z86711 Personal history of pulmonary embolism: Secondary | ICD-10-CM | POA: Diagnosis not present

## 2022-08-05 NOTE — Progress Notes (Signed)
Virtual Visit via Video Note  I connected with Bridget [name] on 08/05/22 at  3:40 PM EDT by a video enabled telemedicine application and verified that I am speaking with the correct person using two identifiers.   I discussed the limitations of evaluation and management by telemedicine and the availability of in person appointments. The patient expressed understanding and agreed to proceed.  I was located at our [facility] office. The patient was at home. There was no one else present in the visit.  Chief Complaint  Patient presents with   Back Pain     History of Present Illness:  Bridget [name] presents for R upper chest, R axilla pain after she came back from [place] Review of Systems  Constitutional:  Negative for chills and fever.  Respiratory:  Positive for shortness of breath. Negative for sputum production.   Cardiovascular:  Positive for chest pain. Negative for leg swelling.  Musculoskeletal:  Positive for back pain and neck pain.  Skin:  Negative for rash.  Neurological:  Negative for weakness.     Observations/Objective: The patient appears to be in no acute distress  Assessment and Plan:  Problem List Items Addressed This Visit     Shortness of breath - Primary   Relevant Orders   CBC with Differential/Platelet   Comprehensive metabolic panel   D-dimer, quantitative   DG Chest 2 View   Other Visit Diagnoses     Chest pain, atypical       Relevant Orders   CBC with Differential/Platelet   Comprehensive metabolic panel   D-dimer, quantitative   DG Chest 2 View        No orders of the defined types were placed in this encounter.    Follow Up Instructions:    I discussed the assessment and treatment plan with the patient. The patient was provided an opportunity to ask questions and all were answered. The patient agreed with the plan and demonstrated an understanding of the instructions.   The patient was advised to call back or seek an  in-person evaluation if the symptoms worsen or if the condition fails to improve as anticipated.  I provided face-to-face time during this encounter. We were at different locations.   Sonda Primes, MD

## 2022-08-05 NOTE — Assessment & Plan Note (Signed)
Atypical R CP - new CXR Check D dimer, CBC, CMET Re-sart ASA 81 mg Use  Ibuprofen 600 mg tid x 2-3 d

## 2022-08-06 ENCOUNTER — Telehealth: Payer: Self-pay | Admitting: Internal Medicine

## 2022-08-06 ENCOUNTER — Encounter: Payer: Self-pay | Admitting: Internal Medicine

## 2022-08-06 LAB — D-DIMER, QUANTITATIVE: D-Dimer, Quant: 1.31 ug{FEU}/mL — ABNORMAL HIGH

## 2022-08-06 LAB — CBC WITH DIFFERENTIAL/PLATELET
Basophils Absolute: 0.1 K/uL (ref 0.0–0.1)
Basophils Relative: 0.7 % (ref 0.0–3.0)
Eosinophils Absolute: 0.3 K/uL (ref 0.0–0.7)
Eosinophils Relative: 4.1 % (ref 0.0–5.0)
HCT: 40.2 % (ref 36.0–46.0)
Hemoglobin: 13.2 g/dL (ref 12.0–15.0)
Lymphocytes Relative: 30.4 % (ref 12.0–46.0)
Lymphs Abs: 2.5 K/uL (ref 0.7–4.0)
MCHC: 32.8 g/dL (ref 30.0–36.0)
MCV: 98.4 fl (ref 78.0–100.0)
Monocytes Absolute: 0.7 K/uL (ref 0.1–1.0)
Monocytes Relative: 8.5 % (ref 3.0–12.0)
Neutro Abs: 4.7 K/uL (ref 1.4–7.7)
Neutrophils Relative %: 56.3 % (ref 43.0–77.0)
Platelets: 341 K/uL (ref 150.0–400.0)
RBC: 4.09 Mil/uL (ref 3.87–5.11)
RDW: 12.6 % (ref 11.5–15.5)
WBC: 8.3 K/uL (ref 4.0–10.5)

## 2022-08-06 LAB — COMPREHENSIVE METABOLIC PANEL WITH GFR
ALT: 18 U/L (ref 0–35)
AST: 19 U/L (ref 0–37)
Albumin: 3.8 g/dL (ref 3.5–5.2)
Alkaline Phosphatase: 39 U/L (ref 39–117)
BUN: 12 mg/dL (ref 6–23)
CO2: 29 meq/L (ref 19–32)
Calcium: 9.1 mg/dL (ref 8.4–10.5)
Chloride: 102 meq/L (ref 96–112)
Creatinine, Ser: 0.53 mg/dL (ref 0.40–1.20)
GFR: 111.9 mL/min
Glucose, Bld: 93 mg/dL (ref 70–99)
Potassium: 4 meq/L (ref 3.5–5.1)
Sodium: 138 meq/L (ref 135–145)
Total Bilirubin: 0.9 mg/dL (ref 0.2–1.2)
Total Protein: 6.7 g/dL (ref 6.0–8.3)

## 2022-08-06 NOTE — Telephone Encounter (Signed)
Pls advise on xarelto.Marland KitchenRaechel [name]

## 2022-08-06 NOTE — Telephone Encounter (Signed)
Please advise on how many xarelto is pt suppose. The med on file but not been fill by you.Marland KitchenRaechel Chute

## 2022-08-06 NOTE — Telephone Encounter (Signed)
Inform pt MD placed referral once referral coordinator check w/insurance someone will call w/ appt. Pt is requesting appt ASAP she states she had a PE before and don;t want to wait around. Inform pt will send a msg to referral coordinator...Shearon Stalls

## 2022-08-06 NOTE — Telephone Encounter (Signed)
Pt called stating that she still in pain can Dr Macario Golds prescribe something for pt pain.

## 2022-08-06 NOTE — Telephone Encounter (Signed)
Patient wants to know when she can go for her CT Scan to rule out pulmonary embolism. She does not want to wait around all day and not hear anything.  Please call patient at [phone]

## 2022-08-06 NOTE — Telephone Encounter (Signed)
Bridget [name], She can take 20 mg a day while they are figuring the rest.  Put aspirin on hold.  Thank you

## 2022-08-07 ENCOUNTER — Other Ambulatory Visit: Payer: Self-pay | Admitting: Internal Medicine

## 2022-08-07 ENCOUNTER — Ambulatory Visit (HOSPITAL_BASED_OUTPATIENT_CLINIC_OR_DEPARTMENT_OTHER)
Admission: RE | Admit: 2022-08-07 | Discharge: 2022-08-07 | Disposition: A | Discharge status: Home / Self Care | Payer: 59 | Source: Ambulatory Visit | Attending: Internal Medicine | Admitting: Internal Medicine

## 2022-08-07 DIAGNOSIS — R0602 Shortness of breath: Secondary | ICD-10-CM | POA: Diagnosis not present

## 2022-08-07 DIAGNOSIS — R0789 Other chest pain: Secondary | ICD-10-CM | POA: Insufficient documentation

## 2022-08-07 MED ORDER — TRAMADOL HCL 50 MG PO TABS: 50.0000 mg | ORAL_TABLET | Freq: Four times a day (QID) | ORAL | 0 refills | Status: DC | PRN

## 2022-08-07 MED ORDER — IOHEXOL 350 MG/ML SOLN
100.0000 mL | Freq: Once | INTRAVENOUS | Status: AC | PRN
  Administered 2022-08-07: 100 mL via INTRAVENOUS

## 2022-08-07 NOTE — Telephone Encounter (Signed)
Sent pt MD response on previous msg../lmb

## 2022-09-24 ENCOUNTER — Other Ambulatory Visit: Payer: Self-pay | Admitting: Internal Medicine

## 2022-09-27 ENCOUNTER — Telehealth: Payer: Self-pay | Admitting: Internal Medicine

## 2022-09-27 MED ORDER — POTASSIUM CHLORIDE CRYS ER 20 MEQ PO TBCR: 20.0000 meq | EXTENDED_RELEASE_TABLET | Freq: Two times a day (BID) | ORAL | 0 refills | Status: DC

## 2022-09-27 NOTE — Telephone Encounter (Signed)
Notified pt rx that was prescribe 7/9, the instructions was not change. Will send another rx. Pt has made annual appt on 7/25...Raechel Chute

## 2022-09-27 NOTE — Telephone Encounter (Signed)
The dosage was sent in for pt for potassium chloride SA (KLOR-CON M) 20 MEQ tablet. Dr Macario Golds change the dosage a long time ago and it suppose to be twice a day with the quantity of 60 tablets. Please advise.

## 2022-10-10 ENCOUNTER — Ambulatory Visit (INDEPENDENT_AMBULATORY_CARE_PROVIDER_SITE_OTHER): Payer: 59 | Admitting: Internal Medicine

## 2022-10-10 ENCOUNTER — Encounter: Payer: Self-pay | Admitting: Internal Medicine

## 2022-10-10 VITALS — BP 120/78 | HR 61 | Temp 98.6°F | Ht 64.0 in | Wt 311.0 lb

## 2022-10-10 DIAGNOSIS — Z136 Encounter for screening for cardiovascular disorders: Secondary | ICD-10-CM

## 2022-10-10 DIAGNOSIS — Z0001 Encounter for general adult medical examination with abnormal findings: Secondary | ICD-10-CM | POA: Diagnosis not present

## 2022-10-10 DIAGNOSIS — K219 Gastro-esophageal reflux disease without esophagitis: Secondary | ICD-10-CM

## 2022-10-10 DIAGNOSIS — E559 Vitamin D deficiency, unspecified: Secondary | ICD-10-CM | POA: Diagnosis not present

## 2022-10-10 DIAGNOSIS — Z0000 Encounter for general adult medical examination without abnormal findings: Secondary | ICD-10-CM

## 2022-10-10 DIAGNOSIS — E538 Deficiency of other specified B group vitamins: Secondary | ICD-10-CM

## 2022-10-10 LAB — URINALYSIS
Bilirubin Urine: NEGATIVE
Hgb urine dipstick: NEGATIVE
Ketones, ur: NEGATIVE
Leukocytes,Ua: NEGATIVE
Nitrite: NEGATIVE
Specific Gravity, Urine: 1.02 (ref 1.000–1.030)
Total Protein, Urine: NEGATIVE
Urine Glucose: NEGATIVE
Urobilinogen, UA: 0.2 (ref 0.0–1.0)
pH: 6.5 (ref 5.0–8.0)

## 2022-10-10 LAB — LIPID PANEL
Cholesterol: 139 mg/dL (ref 0–200)
HDL: 47.8 mg/dL (ref >39.00)
LDL Cholesterol: 75 mg/dL (ref 0–99)
NonHDL: 91.2
Total CHOL/HDL Ratio: 3
Triglycerides: 81 mg/dL (ref 0.0–149.0)
VLDL: 16.2 mg/dL (ref 0.0–40.0)

## 2022-10-10 LAB — CBC WITH DIFFERENTIAL/PLATELET
Basophils Absolute: 0 10*3/uL (ref 0.0–0.1)
Basophils Relative: 0.8 % (ref 0.0–3.0)
Eosinophils Absolute: 0.2 10*3/uL (ref 0.0–0.7)
Eosinophils Relative: 4.4 % (ref 0.0–5.0)
HCT: 42.2 % (ref 36.0–46.0)
Hemoglobin: 13.7 g/dL (ref 12.0–15.0)
Lymphocytes Relative: 36.4 % (ref 12.0–46.0)
Lymphs Abs: 2.1 10*3/uL (ref 0.7–4.0)
MCHC: 32.4 g/dL (ref 30.0–36.0)
MCV: 97.8 fl (ref 78.0–100.0)
Monocytes Absolute: 0.4 10*3/uL (ref 0.1–1.0)
Monocytes Relative: 6.8 % (ref 3.0–12.0)
Neutro Abs: 2.9 10*3/uL (ref 1.4–7.7)
Neutrophils Relative %: 51.6 % (ref 43.0–77.0)
Platelets: 329 10*3/uL (ref 150.0–400.0)
RBC: 4.32 Mil/uL (ref 3.87–5.11)
RDW: 12.6 % (ref 11.5–15.5)
WBC: 5.7 10*3/uL (ref 4.0–10.5)

## 2022-10-10 LAB — COMPREHENSIVE METABOLIC PANEL
ALT: 12 U/L (ref 0–35)
AST: 16 U/L (ref 0–37)
Albumin: 4 g/dL (ref 3.5–5.2)
Alkaline Phosphatase: 46 U/L (ref 39–117)
BUN: 13 mg/dL (ref 6–23)
CO2: 31 mEq/L (ref 19–32)
Calcium: 9.8 mg/dL (ref 8.4–10.5)
Chloride: 101 mEq/L (ref 96–112)
Creatinine, Ser: 0.58 mg/dL (ref 0.40–1.20)
GFR: 109.36 mL/min (ref >60.00)
Glucose, Bld: 91 mg/dL (ref 70–99)
Potassium: 4.4 mEq/L (ref 3.5–5.1)
Sodium: 137 mEq/L (ref 135–145)
Total Bilirubin: 1.3 mg/dL — ABNORMAL HIGH (ref 0.2–1.2)
Total Protein: 7 g/dL (ref 6.0–8.3)

## 2022-10-10 LAB — TSH: TSH: 0.74 u[IU]/mL (ref 0.35–5.50)

## 2022-10-10 LAB — VITAMIN B12: Vitamin B-12: 546 pg/mL (ref 211–911)

## 2022-10-10 LAB — VITAMIN D 25 HYDROXY (VIT D DEFICIENCY, FRACTURES): VITD: 30.73 ng/mL (ref 30.00–100.00)

## 2022-10-10 MED ORDER — TRIAMTERENE-HCTZ 37.5-25 MG PO TABS: 1.0000 | ORAL_TABLET | Freq: Every day | ORAL | 3 refills | Status: DC

## 2022-10-10 MED ORDER — TRIAMTERENE-HCTZ 37.5-25 MG PO TABS: 1.0000 | ORAL_TABLET | Freq: Every day | ORAL | 1 refills | Status: DC

## 2022-10-10 MED ORDER — POTASSIUM CHLORIDE CRYS ER 20 MEQ PO TBCR: 20.0000 meq | EXTENDED_RELEASE_TABLET | Freq: Two times a day (BID) | ORAL | 3 refills | Status: DC

## 2022-10-10 MED ORDER — POTASSIUM CHLORIDE CRYS ER 20 MEQ PO TBCR: 20.0000 meq | EXTENDED_RELEASE_TABLET | Freq: Two times a day (BID) | ORAL | 4 refills | Status: DC

## 2022-10-10 NOTE — Assessment & Plan Note (Signed)
We discussed age appropriate health related issues, including available/recomended screening tests and vaccinations. We discussed a need for adhering to healthy diet and exercise. Labs were ordered to be later reviewed . All questions were answered. GYN - Gerald Leitz Mammo Colon 2022 due in 2032 Considering gastric bypass

## 2022-10-10 NOTE — Assessment & Plan Note (Signed)
Considering gastric bypass Now on Protonix

## 2022-10-10 NOTE — Assessment & Plan Note (Signed)
On Vit D 

## 2022-10-10 NOTE — Assessment & Plan Note (Signed)
On B12 

## 2022-10-10 NOTE — Progress Notes (Signed)
Subjective:  Patient ID: Bridget [name], female    DOB: 06-15-1975  Age: 47 y.o. MRN: [id number]  CC: Annual Exam   HPI SIGOURNEY PORTILLO presents for a well exam C/o wt gain lately - s/p gastric sleeve  Outpatient Medications Prior to Visit  Medication Sig Dispense Refill   acetaminophen (TYLENOL) 500 MG tablet Take 1,000 mg by mouth every 6 (six) hours as needed for mild pain.     albuterol (VENTOLIN HFA) 108 (90 Base) MCG/ACT inhaler Inhale 1-2 puffs into the lungs every 6 (six) hours as needed for wheezing or shortness of breath. 18 g 0   aspirin EC 81 MG tablet Take 1 tablet (81 mg total) by mouth daily. 100 tablet 3   cetirizine (ZYRTEC) 10 MG tablet Take 10 mg by mouth daily as needed for allergies.     Cholecalciferol (VITAMIN D3) 125 MCG (5000 UT) CAPS Take 1 capsule by mouth daily.     clonazePAM (KLONOPIN) 0.5 MG disintegrating tablet Take 1-2 tablets (0.5-1 mg total) by mouth 2 (two) times daily as needed (panic attacks). 60 tablet 1   Cyanocobalamin (VITAMIN B-12) 1000 MCG SUBL Place 1 tablet (1,000 mcg total) under the tongue daily. 100 tablet 3   cyclobenzaprine (FLEXERIL) 10 MG tablet Take 1 tablet (10 mg total) by mouth 2 (two) times daily as needed for muscle spasms. 20 tablet 0   fluticasone (FLONASE) 50 MCG/ACT nasal spray Place 2 sprays into both nostrils daily. 16 g 6   lidocaine (LIDODERM) 5 % Place 1 patch onto the skin daily. Remove & Discard patch within 12 hours or as directed by MD 30 patch 0   Multiple Vitamin (MULTIVITAMIN) capsule Take 1 capsule by mouth daily.     ondansetron (ZOFRAN) 4 MG tablet Take 1 tablet (4 mg total) by mouth every 8 (eight) hours as needed for nausea or vomiting. 24 tablet 2   pantoprazole (PROTONIX) 40 MG tablet Take 1 tablet (40 mg total) by mouth daily. 90 tablet 01   potassium chloride SA (KLOR-CON M) 20 MEQ tablet Take 1 tablet (20 mEq total) by mouth 2 (two) times daily. Annual appt due must see provider for future refills  (Patient taking differently: Take 20 mEq by mouth 2 (two) times daily. TAKE ONE TABLET TWO TIMES A DAY) 60 tablet 0   rivaroxaban (XARELTO) 20 MG TABS tablet Take by mouth.     rizatriptan (MAXALT) 10 MG tablet TAKE 1 TABLET BY MOUTH ONCE AS NEEDED FOR UP TO 1 DOSE FOR MIGRAINE. MAY REPEAT IN 2 HOURS IF NEEDED 4 tablet 2   traMADol (ULTRAM) 50 MG tablet Take 1 tablet (50 mg total) by mouth every 6 (six) hours as needed for severe pain. 20 tablet 0   triamterene-hydrochlorothiazide (MAXZIDE-25) 37.5-25 MG tablet Take 1 tablet by mouth daily. Annual appt due in Aug must see provider for future refills 90 tablet 1   No facility-administered medications prior to visit.    ROS: Review of Systems  Constitutional:  Positive for unexpected weight change. Negative for activity change, appetite change, chills and fatigue.  HENT:  Negative for congestion, mouth sores and sinus pressure.   Eyes:  Negative for visual disturbance.  Respiratory:  Negative for cough and chest tightness.   Gastrointestinal:  Negative for abdominal pain and nausea.  Genitourinary:  Negative for difficulty urinating, frequency and vaginal pain.  Musculoskeletal:  Negative for back pain and gait problem.  Skin:  Negative for pallor and rash.  Neurological:  Negative for dizziness, tremors, weakness, numbness and headaches.  Psychiatric/Behavioral:  Negative for confusion, sleep disturbance and suicidal ideas. The patient is nervous/anxious.     Objective:  BP 120/78 (BP Location: Left Arm, Patient Position: Sitting, Cuff Size: Normal)   Pulse 61   Temp 98.6 F (37 C) (Oral)   Ht 5\' 4"  (1.626 m)   Wt (!) 311 lb (141.1 kg)   SpO2 98%   BMI 53.38 kg/m   BP Readings from Last 3 Encounters:  10/10/22 120/78  06/25/22 130/84  11/30/21 117/80    Wt Readings from Last 3 Encounters:  10/10/22 (!) 311 lb (141.1 kg)  06/25/22 298 lb (135.2 kg)  11/30/21 288 lb (130.6 kg)    Physical Exam Constitutional:       General: She is not in acute distress.    Appearance: She is well-developed. She is obese.  HENT:     Head: Normocephalic.     Right Ear: External ear normal.     Left Ear: External ear normal.     Nose: Nose normal.  Eyes:     General:        Right eye: No discharge.        Left eye: No discharge.     Conjunctiva/sclera: Conjunctivae normal.     Pupils: Pupils are equal, round, and reactive to light.  Neck:     Thyroid: No thyromegaly.     Vascular: No JVD.     Trachea: No tracheal deviation.  Cardiovascular:     Rate and Rhythm: Normal rate and regular rhythm.     Heart sounds: Normal heart sounds.  Pulmonary:     Effort: No respiratory distress.     Breath sounds: No stridor. No wheezing.  Abdominal:     General: Bowel sounds are normal. There is no distension.     Palpations: Abdomen is soft. There is no mass.     Tenderness: There is no abdominal tenderness. There is no guarding or rebound.  Musculoskeletal:        General: No tenderness.     Cervical back: Normal range of motion and neck supple. No rigidity.  Lymphadenopathy:     Cervical: No cervical adenopathy.  Skin:    Findings: No erythema or rash.  Neurological:     Mental Status: She is oriented to person, place, and time.     Cranial Nerves: No cranial nerve deficit.     Motor: No abnormal muscle tone.     Coordination: Coordination normal.     Deep Tendon Reflexes: Reflexes normal.  Psychiatric:        Behavior: Behavior normal.        Thought Content: Thought content normal.        Judgment: Judgment normal.     Lab Results  Component Value Date   WBC 8.3 08/05/2022   HGB 13.2 08/05/2022   HCT 40.2 08/05/2022   PLT 341.0 08/05/2022   GLUCOSE 93 08/05/2022   CHOL 147 10/10/2020   TRIG 109.0 10/10/2020   HDL 44.30 10/10/2020   LDLCALC 81 10/10/2020   ALT 18 08/05/2022   AST 19 08/05/2022   NA 138 08/05/2022   K 4.0 08/05/2022   CL 102 08/05/2022   CREATININE 0.53 08/05/2022   BUN 12  08/05/2022   CO2 29 08/05/2022   TSH 0.76 10/10/2020   HGBA1C 5.3 10/29/2017    CT Angio Chest Pulmonary Embolism (PE) W or WO Contrast  Result Date: 08/07/2022 CLINICAL DATA:  History of pulmonary embolism and recent travel, presenting with right side upper back and axillary pain x1 week. EXAM: CT ANGIOGRAPHY CHEST WITH CONTRAST TECHNIQUE: Multidetector CT imaging of the chest was performed using the standard protocol during bolus administration of intravenous contrast. Multiplanar CT image reconstructions and MIPs were obtained to evaluate the vascular anatomy. RADIATION DOSE REDUCTION: This exam was performed according to the departmental dose-optimization program which includes automated exposure control, adjustment of the mA and/or kV according to patient size and/or use of iterative reconstruction technique. CONTRAST:  OMNIPAQUE IOHEXOL 350 MG/ML SOLN COMPARISON:  November 30, 2021 FINDINGS: Cardiovascular: The thoracic aorta is normal in appearance. Satisfactory opacification of the pulmonary arteries to the segmental level. No evidence of pulmonary embolism. Normal heart size. No pericardial effusion. Mediastinum/Nodes: No enlarged mediastinal, hilar, or axillary lymph nodes. Thyroid gland, trachea, and esophagus demonstrate no significant findings. Lungs/Pleura: Very small areas of scarring and/or atelectasis are seen within the bilateral lung bases. There is no evidence of acute infiltrate, pleural effusion or pneumothorax. Upper Abdomen: Surgical sutures are seen throughout the gastric region. Musculoskeletal: The stable marked severity dextroscoliosis of the mid thoracic spine. No acute or significant osseous findings. Review of the MIP images confirms the above findings. IMPRESSION: 1. No evidence of pulmonary embolism or other acute intrathoracic process. Electronically Signed   By: Aram Candela M.D.   On: 08/07/2022 17:30    Assessment & Plan:   Problem List Items Addressed  This Visit     Morbid (severe) obesity due to excess calories (HCC) - Primary    Status post gastric sleeve procedure in 2022. C/o wt gain Considering gastric bypass      B12 deficiency    On B12      Vitamin D deficiency    On Vit D      Well adult exam    We discussed age appropriate health related issues, including available/recomended screening tests and vaccinations. We discussed a need for adhering to healthy diet and exercise. Labs were ordered to be later reviewed . All questions were answered. GYN - Gerald Leitz Mammo Colon 2022 due in 2032 Considering gastric bypass      GERD (gastroesophageal reflux disease)    Considering gastric bypass Now on Protonix          No orders of the defined types were placed in this encounter.     Follow-up: No follow-ups on file.  Sonda Primes, MD

## 2022-10-10 NOTE — Assessment & Plan Note (Signed)
Status post gastric sleeve procedure in 2022. C/o wt gain Considering gastric bypass

## 2022-10-30 ENCOUNTER — Telehealth: Payer: Self-pay | Admitting: *Deleted

## 2022-10-30 MED ORDER — TRIAMTERENE-HCTZ 37.5-25 MG PO TABS: 1.0000 | ORAL_TABLET | Freq: Every day | ORAL | 3 refills | Status: DC

## 2022-10-30 NOTE — Telephone Encounter (Signed)
Pt states she is now using [facility] and would like her Triamterene sent to them. Inform pt will send.Marland KitchenRaechel Chute

## 2022-12-31 ENCOUNTER — Encounter: Payer: Self-pay | Admitting: Internal Medicine

## 2022-12-31 ENCOUNTER — Ambulatory Visit: Payer: 59 | Admitting: Internal Medicine

## 2022-12-31 VITALS — BP 112/70 | HR 96 | Temp 98.3°F | Ht 64.0 in | Wt 308.0 lb

## 2022-12-31 DIAGNOSIS — E538 Deficiency of other specified B group vitamins: Secondary | ICD-10-CM

## 2022-12-31 DIAGNOSIS — E43 Unspecified severe protein-calorie malnutrition: Secondary | ICD-10-CM | POA: Diagnosis not present

## 2022-12-31 DIAGNOSIS — R739 Hyperglycemia, unspecified: Secondary | ICD-10-CM | POA: Diagnosis not present

## 2022-12-31 LAB — COMPREHENSIVE METABOLIC PANEL
ALT: 14 U/L (ref 0–35)
AST: 19 U/L (ref 0–37)
Albumin: 3.8 g/dL (ref 3.5–5.2)
Alkaline Phosphatase: 37 U/L — ABNORMAL LOW (ref 39–117)
BUN: 16 mg/dL (ref 6–23)
CO2: 29 meq/L (ref 19–32)
Calcium: 9.5 mg/dL (ref 8.4–10.5)
Chloride: 104 meq/L (ref 96–112)
Creatinine, Ser: 0.62 mg/dL (ref 0.40–1.20)
GFR: 107.44 mL/min (ref >60.00)
Glucose, Bld: 110 mg/dL — ABNORMAL HIGH (ref 70–99)
Potassium: 3.6 meq/L (ref 3.5–5.1)
Sodium: 140 meq/L (ref 135–145)
Total Bilirubin: 1.3 mg/dL — ABNORMAL HIGH (ref 0.2–1.2)
Total Protein: 6.8 g/dL (ref 6.0–8.3)

## 2022-12-31 LAB — HEMOGLOBIN A1C: Hgb A1c MFr Bld: 5.3 % (ref 4.6–6.5)

## 2022-12-31 MED ORDER — FUROSEMIDE 20 MG PO TABS: 20.0000 mg | ORAL_TABLET | Freq: Every day | ORAL | 3 refills | Status: DC | PRN

## 2022-12-31 NOTE — Assessment & Plan Note (Addendum)
Recurrent, dependent edema D/c Triamterene Start Lasix prn, K dur

## 2022-12-31 NOTE — Assessment & Plan Note (Signed)
On sublingual vitamin B12

## 2022-12-31 NOTE — Assessment & Plan Note (Signed)
Check A1c, CMET Needs a gastric sleeve revision

## 2022-12-31 NOTE — Progress Notes (Signed)
Subjective:  Patient ID: Bridget [name], female    DOB: [birth year]  Age: 47 y.o. MRN: [id number]  CC: Follow-up (Blood Sugar Levels, Pt would like A1c checked today. Pt is also asking to be rx Mounjaro to help her with weight loss as well.)   HPI Bridget [name] presents for elevated glucose Needs a gastric sleeve revision 2024 C/o edema  Outpatient Medications Prior to Visit  Medication Sig Dispense Refill   acetaminophen (TYLENOL) 500 MG tablet Take 1,000 mg by mouth every 6 (six) hours as needed for mild pain.     albuterol (VENTOLIN HFA) 108 (90 Base) MCG/ACT inhaler Inhale 1-2 puffs into the lungs every 6 (six) hours as needed for wheezing or shortness of breath. 18 g 0   cetirizine (ZYRTEC) 10 MG tablet Take 10 mg by mouth daily as needed for allergies.     Cholecalciferol (VITAMIN D3) 125 MCG (5000 UT) CAPS Take 1 capsule by mouth daily.     clonazePAM (KLONOPIN) 0.5 MG disintegrating tablet Take 1-2 tablets (0.5-1 mg total) by mouth 2 (two) times daily as needed (panic attacks). 60 tablet 1   Cyanocobalamin (VITAMIN B-12) 1000 MCG SUBL Place 1 tablet (1,000 mcg total) under the tongue daily. 100 tablet 3   cyclobenzaprine (FLEXERIL) 10 MG tablet Take 1 tablet (10 mg total) by mouth 2 (two) times daily as needed for muscle spasms. 20 tablet 0   fluticasone (FLONASE) 50 MCG/ACT nasal spray Place 2 sprays into both nostrils daily. 16 g 6   lidocaine (LIDODERM) 5 % Place 1 patch onto the skin daily. Remove & Discard patch within 12 hours or as directed by MD 30 patch 0   Multiple Vitamin (MULTIVITAMIN) capsule Take 1 capsule by mouth daily.     ondansetron (ZOFRAN) 4 MG tablet Take 1 tablet (4 mg total) by mouth every 8 (eight) hours as needed for nausea or vomiting. 24 tablet 2   pantoprazole (PROTONIX) 40 MG tablet Take 1 tablet (40 mg total) by mouth daily. 90 tablet 01   potassium chloride SA (KLOR-CON M) 20 MEQ tablet Take 1 tablet (20 mEq total) by mouth 2 (two) times daily.  TAKE ONE TABLET TWO TIMES A DAY 180 tablet 3   rivaroxaban (XARELTO) 20 MG TABS tablet Take by mouth.     rizatriptan (MAXALT) 10 MG tablet TAKE 1 TABLET BY MOUTH ONCE AS NEEDED FOR UP TO 1 DOSE FOR MIGRAINE. MAY REPEAT IN 2 HOURS IF NEEDED 4 tablet 2   traMADol (ULTRAM) 50 MG tablet Take 1 tablet (50 mg total) by mouth every 6 (six) hours as needed for severe pain. 20 tablet 0   triamterene-hydrochlorothiazide (MAXZIDE-25) 37.5-25 MG tablet Take 1 tablet by mouth daily. 90 tablet 3   No facility-administered medications prior to visit.    ROS: Review of Systems  Constitutional:  Negative for activity change, appetite change, chills, fatigue and unexpected weight change.  HENT:  Negative for congestion, mouth sores and sinus pressure.   Eyes:  Negative for visual disturbance.  Respiratory:  Negative for cough and chest tightness.   Cardiovascular:  Positive for leg swelling.  Gastrointestinal:  Negative for abdominal pain and nausea.  Genitourinary:  Negative for difficulty urinating, frequency and vaginal pain.  Musculoskeletal:  Negative for back pain and gait problem.  Skin:  Negative for pallor and rash.  Neurological:  Negative for dizziness, tremors, weakness, numbness and headaches.  Psychiatric/Behavioral:  Negative for confusion and sleep disturbance. The patient is nervous/anxious.  Objective:  BP 112/70 (BP Location: Right Arm, Patient Position: Sitting, Cuff Size: Normal)   Pulse 96   Temp 98.3 F (36.8 C) (Oral)   Ht 5\' 4"  (1.626 m)   Wt (!) 308 lb (139.7 kg)   SpO2 97%   BMI 52.87 kg/m   BP Readings from Last 3 Encounters:  12/31/22 112/70  10/10/22 120/78  06/25/22 130/84    Wt Readings from Last 3 Encounters:  12/31/22 (!) 308 lb (139.7 kg)  10/10/22 (!) 311 lb (141.1 kg)  06/25/22 298 lb (135.2 kg)    Physical Exam Constitutional:      General: She is not in acute distress.    Appearance: She is well-developed. She is obese.  HENT:     Head:  Normocephalic.     Right Ear: External ear normal.     Left Ear: External ear normal.     Nose: Nose normal.  Eyes:     General:        Right eye: No discharge.        Left eye: No discharge.     Conjunctiva/sclera: Conjunctivae normal.     Pupils: Pupils are equal, round, and reactive to light.  Neck:     Thyroid: No thyromegaly.     Vascular: No JVD.     Trachea: No tracheal deviation.  Cardiovascular:     Rate and Rhythm: Normal rate and regular rhythm.     Heart sounds: Normal heart sounds.  Pulmonary:     Effort: No respiratory distress.     Breath sounds: No stridor. No wheezing.  Abdominal:     General: Bowel sounds are normal. There is no distension.     Palpations: Abdomen is soft. There is no mass.     Tenderness: There is no abdominal tenderness. There is no guarding or rebound.  Musculoskeletal:        General: No tenderness.     Cervical back: Normal range of motion and neck supple. No rigidity.  Lymphadenopathy:     Cervical: No cervical adenopathy.  Skin:    Findings: No erythema or rash.  Neurological:     Cranial Nerves: No cranial nerve deficit.     Motor: No abnormal muscle tone.     Coordination: Coordination normal.     Deep Tendon Reflexes: Reflexes normal.  Psychiatric:        Behavior: Behavior normal.        Thought Content: Thought content normal.        Judgment: Judgment normal.   No edema  Lab Results  Component Value Date   WBC 5.7 10/10/2022   HGB 13.7 10/10/2022   HCT 42.2 10/10/2022   PLT 329.0 10/10/2022   GLUCOSE 91 10/10/2022   CHOL 139 10/10/2022   TRIG 81.0 10/10/2022   HDL 47.80 10/10/2022   LDLCALC 75 10/10/2022   ALT 12 10/10/2022   AST 16 10/10/2022   NA 137 10/10/2022   K 4.4 10/10/2022   CL 101 10/10/2022   CREATININE 0.58 10/10/2022   BUN 13 10/10/2022   CO2 31 10/10/2022   TSH 0.74 10/10/2022   HGBA1C 5.3 10/29/2017    CT Angio Chest Pulmonary Embolism (PE) W or WO Contrast  Result Date:  08/07/2022 CLINICAL DATA:  History of pulmonary embolism and recent travel, presenting with right side upper back and axillary pain x1 week. EXAM: CT ANGIOGRAPHY CHEST WITH CONTRAST TECHNIQUE: Multidetector CT imaging of the chest was performed using the standard protocol during  bolus administration of intravenous contrast. Multiplanar CT image reconstructions and MIPs were obtained to evaluate the vascular anatomy. RADIATION DOSE REDUCTION: This exam was performed according to the departmental dose-optimization program which includes automated exposure control, adjustment of the mA and/or kV according to patient size and/or use of iterative reconstruction technique. CONTRAST:  OMNIPAQUE IOHEXOL 350 MG/ML SOLN COMPARISON:  November 30, 2021 FINDINGS: Cardiovascular: The thoracic aorta is normal in appearance. Satisfactory opacification of the pulmonary arteries to the segmental level. No evidence of pulmonary embolism. Normal heart size. No pericardial effusion. Mediastinum/Nodes: No enlarged mediastinal, hilar, or axillary lymph nodes. Thyroid gland, trachea, and esophagus demonstrate no significant findings. Lungs/Pleura: Very small areas of scarring and/or atelectasis are seen within the bilateral lung bases. There is no evidence of acute infiltrate, pleural effusion or pneumothorax. Upper Abdomen: Surgical sutures are seen throughout the gastric region. Musculoskeletal: The stable marked severity dextroscoliosis of the mid thoracic spine. No acute or significant osseous findings. Review of the MIP images confirms the above findings. IMPRESSION: 1. No evidence of pulmonary embolism or other acute intrathoracic process. Electronically Signed   By: Aram Candela M.D.   On: 08/07/2022 17:30    Assessment & Plan:   Problem List Items Addressed This Visit     Morbid (severe) obesity due to excess calories (HCC)    Needs a gastric sleeve revision 2024      Edema    Recurrent, dependent edema D/c  Triamterene Start Lasix prn      B12 deficiency    On sublingual vitamin B12      Hyperglycemia - Primary    Check A1c, CMET Needs a gastric sleeve revision      Relevant Orders   Comprehensive metabolic panel   Hemoglobin A1c      Meds ordered this encounter  Medications   furosemide (LASIX) 20 MG tablet    Sig: Take 1-2 tablets (20-40 mg total) by mouth daily as needed.    Dispense:  60 tablet    Refill:  3      Follow-up: Return in about 3 months (around 04/02/2023) for a follow-up visit.  Sonda Primes, MD

## 2022-12-31 NOTE — Assessment & Plan Note (Signed)
Needs a gastric sleeve revision 2024

## 2023-01-07 ENCOUNTER — Ambulatory Visit: Payer: 59 | Admitting: Internal Medicine

## 2023-01-19 ENCOUNTER — Emergency Department (HOSPITAL_BASED_OUTPATIENT_CLINIC_OR_DEPARTMENT_OTHER): Payer: 59

## 2023-01-19 ENCOUNTER — Emergency Department (HOSPITAL_BASED_OUTPATIENT_CLINIC_OR_DEPARTMENT_OTHER)
Admission: EM | Admit: 2023-01-19 | Discharge: 2023-01-19 | Disposition: A | Discharge status: Home / Self Care | Payer: 59 | Attending: Emergency Medicine | Admitting: Emergency Medicine

## 2023-01-19 ENCOUNTER — Encounter (HOSPITAL_BASED_OUTPATIENT_CLINIC_OR_DEPARTMENT_OTHER): Payer: Self-pay | Admitting: Emergency Medicine

## 2023-01-19 ENCOUNTER — Other Ambulatory Visit: Payer: Self-pay

## 2023-01-19 DIAGNOSIS — M79671 Pain in right foot: Secondary | ICD-10-CM | POA: Diagnosis present

## 2023-01-19 MED ORDER — IBUPROFEN 600 MG PO TABS
600.0000 mg | ORAL_TABLET | Freq: Four times a day (QID) | ORAL | 0 refills | Status: DC | PRN
Start: 2023-01-19 — End: 2023-06-02

## 2023-01-19 NOTE — ED Provider Notes (Signed)
[place] [facility] Provider Note   CSN: [id number] Arrival date & time: 01/19/23  1123     History  Chief Complaint  Patient presents with   Foot Pain    Bridget [name] is a 47 y.o. female.  The history is provided by the patient and medical records. No language interpreter was used.  Foot Pain     47 year old female significant history of obesity, DVT presenting with complaint of foot pain.  Patient report acute onset of pain to the ball of her right foot that started yesterday.  She noticed that when she stepped on her foot and felt a sharp pain.  She felt like something is inside her foot like a foreign body.  She denies any injury to the foot.  She denies any numbness he does not complain of any calf tenderness or leg swelling no chest pain or shortness of breath no change in her shoes or footwear.  No specific treatment tried.  Home Medications Prior to Admission medications   Medication Sig Start Date End Date Taking? Authorizing Provider  acetaminophen (TYLENOL) 500 MG tablet Take 1,000 mg by mouth every 6 (six) hours as needed for mild pain.    [provider]  albuterol (VENTOLIN HFA) 108 (90 Base) MCG/ACT inhaler Inhale 1-2 puffs into the lungs every 6 (six) hours as needed for wheezing or shortness of breath. 06/21/20   Olive Bass, FNP  cetirizine (ZYRTEC) 10 MG tablet Take 10 mg by mouth daily as needed for allergies.    [provider]  Cholecalciferol (VITAMIN D3) 125 MCG (5000 UT) CAPS Take 1 capsule by mouth daily.    [provider]  clonazePAM (KLONOPIN) 0.5 MG disintegrating tablet Take 1-2 tablets (0.5-1 mg total) by mouth 2 (two) times daily as needed (panic attacks). 09/29/19   Plotnikov, Georgina Quint, MD  Cyanocobalamin (VITAMIN B-12) 1000 MCG SUBL Place 1 tablet (1,000 mcg total) under the tongue daily. 08/04/19   Plotnikov, Georgina Quint, MD  cyclobenzaprine (FLEXERIL) 10 MG tablet Take 1  tablet (10 mg total) by mouth 2 (two) times daily as needed for muscle spasms. 11/30/21   Tegeler, Canary Brim, MD  fluticasone (FLONASE) 50 MCG/ACT nasal spray Place 2 sprays into both nostrils daily. 05/30/21   Myrlene Broker, MD  furosemide (LASIX) 20 MG tablet Take 1-2 tablets (20-40 mg total) by mouth daily as needed. 12/31/22   Plotnikov, Georgina Quint, MD  lidocaine (LIDODERM) 5 % Place 1 patch onto the skin daily. Remove & Discard patch within 12 hours or as directed by MD 11/30/21   Tegeler, Canary Brim, MD  Multiple Vitamin (MULTIVITAMIN) capsule Take 1 capsule by mouth daily.    [provider]  ondansetron (ZOFRAN) 4 MG tablet Take 1 tablet (4 mg total) by mouth every 8 (eight) hours as needed for nausea or vomiting. 10/10/20   Plotnikov, Georgina Quint, MD  pantoprazole (PROTONIX) 40 MG tablet Take 1 tablet (40 mg total) by mouth daily. 11/06/20   Plotnikov, Georgina Quint, MD  potassium chloride SA (KLOR-CON M) 20 MEQ tablet Take 1 tablet (20 mEq total) by mouth 2 (two) times daily. TAKE ONE TABLET TWO TIMES A DAY 10/10/22   Plotnikov, Georgina Quint, MD  rivaroxaban (XARELTO) 20 MG TABS tablet Take by mouth. 03/04/20   [provider]  rizatriptan (MAXALT) 10 MG tablet TAKE 1 TABLET BY MOUTH ONCE AS NEEDED FOR UP TO 1 DOSE FOR MIGRAINE. MAY REPEAT IN 2  HOURS IF NEEDED 11/26/21   Plotnikov, Georgina Quint, MD  traMADol (ULTRAM) 50 MG tablet Take 1 tablet (50 mg total) by mouth every 6 (six) hours as needed for severe pain. 08/07/22   Plotnikov, Georgina Quint, MD      Allergies    Patient has no known allergies.    Review of Systems   Review of Systems  All other systems reviewed and are negative.   Physical Exam Updated Vital Signs BP (!) 160/86 (BP Location: Left Arm)   Pulse 84   Temp 98.4 F (36.9 C) (Oral)   Resp 16   Ht 5\' 4"  (1.626 m)   Wt (!) 140.6 kg   LMP 01/13/2023   SpO2 100%   BMI 53.21 kg/m  Physical Exam Vitals and nursing note reviewed.  Constitutional:       General: She is not in acute distress.    Appearance: She is well-developed. She is obese.  HENT:     Head: Atraumatic.  Eyes:     Conjunctiva/sclera: Conjunctivae normal.  Pulmonary:     Effort: Pulmonary effort is normal.  Musculoskeletal:        General: Tenderness (Right foot: Upon palpation there is a subcutaneous nodule noted in between the ball of the foot near the first and second metatarsal without any foreign body no erythema no warmth no laceration) present.     Cervical back: Neck supple.  Skin:    Findings: No rash.  Neurological:     Mental Status: She is alert.  Psychiatric:        Mood and Affect: Mood normal.     ED Results / Procedures / Treatments   Labs (all labs ordered are listed, but only abnormal results are displayed) Labs Reviewed - No data to display  EKG None  Radiology DG Foot Complete Right  Result Date: 01/19/2023 CLINICAL DATA:  Pain, bruising and swelling in the ball of the foot since yesterday. No known injury. EXAM: RIGHT FOOT COMPLETE - 3+ VIEW COMPARISON:  Radiographs 08/04/2019 FINDINGS: The mineralization and alignment are normal. There is no evidence of acute fracture or dislocation. The joint spaces are preserved. There are chronic soft tissue calcifications anteriorly in the lower leg which are similar to the previous study. No foreign body or other focal soft tissue abnormality identified in the foot. IMPRESSION: No acute osseous findings or significant arthropathic changes. Chronic soft tissue calcifications anteriorly in the lower leg. Electronically Signed   By: Carey Bullocks M.D.   On: 01/19/2023 13:57    Procedures Procedures    Medications Ordered in ED Medications - No data to display  ED Course/ Medical Decision Making/ A&P                                 Medical Decision Making Amount and/or Complexity of Data Reviewed Radiology: ordered.   BP (!) 160/86 (BP Location: Left Arm)   Pulse 84   Temp 98.4 F (36.9 C)  (Oral)   Resp 16   Ht 5\' 4"  (1.626 m)   Wt (!) 140.6 kg   LMP 01/13/2023   SpO2 100%   BMI 53.21 kg/m   5:57 PM 47 year old female significant history of obesity, DVT presenting with complaint of foot pain.  Patient report acute onset of pain to the ball of her right foot that started yesterday.  She noticed that when she stepped on her foot and felt a  sharp pain.  She felt like something is inside her foot like a foreign body.  She denies any injury to the foot.  She denies any numbness he does not complain of any calf tenderness or leg swelling no chest pain or shortness of breath no change in her shoes or footwear.  No specific treatment tried.  On exam, patient has a subcutaneous nodule approximately 3 mm in diameter noted to the ball of her right foot in between the first and second metatarsal region.  No foreign body noted no signs of infection.  I suspect this is likely to be a Morton's neuroma likely due to large body habitus.  This is not consistent with an abscess or cellulitis.  Symptoms not consistent with plantar fasciitis and I have low suspicion for DVT causing her condition.  Patient discharged home with NSAIDs, and referral to podiatry for outpatient management.  X-ray of her right foot was obtained independently viewed interpreted by me and without any acute finding, agrees with radiology interpretation.  Ear pain        Final Clinical Impression(s) / ED Diagnoses Final diagnoses:  Right foot pain    Rx / DC Orders ED Discharge Orders          Ordered    ibuprofen (ADVIL) 600 MG tablet  Every 6 hours PRN        01/19/23 1422              Fayrene Helper, PA-C 01/19/23 1423    Terald Sleeper, MD 01/19/23 1437

## 2023-01-19 NOTE — Discharge Instructions (Addendum)
Your pain is likely due to a Morton's neuroma.  You may take anti-inflammatory medication and follow-up closely with a podiatrist for outpatient management of your condition.

## 2023-01-19 NOTE — ED Triage Notes (Signed)
Right ball of foot pain, swelling and bruising since yesterday. No known injury or step on anything.  Pt states it feels like something is in her foot.

## 2023-02-19 ENCOUNTER — Encounter: Payer: Self-pay | Admitting: Internal Medicine

## 2023-02-19 ENCOUNTER — Ambulatory Visit (INDEPENDENT_AMBULATORY_CARE_PROVIDER_SITE_OTHER): Payer: 59 | Admitting: Internal Medicine

## 2023-02-19 VITALS — BP 118/78 | HR 78 | Temp 98.3°F | Ht 64.0 in | Wt 300.0 lb

## 2023-02-19 DIAGNOSIS — M25562 Pain in left knee: Secondary | ICD-10-CM | POA: Diagnosis not present

## 2023-02-19 DIAGNOSIS — Z6841 Body Mass Index (BMI) 40.0 and over, adult: Secondary | ICD-10-CM

## 2023-02-19 DIAGNOSIS — G43109 Migraine with aura, not intractable, without status migrainosus: Secondary | ICD-10-CM | POA: Diagnosis not present

## 2023-02-19 MED ORDER — MELOXICAM 15 MG PO TABS: 15.0000 mg | ORAL_TABLET | Freq: Every day | ORAL | 0 refills | Status: DC

## 2023-02-19 MED ORDER — RIZATRIPTAN BENZOATE 10 MG PO TABS: 10.0000 mg | ORAL_TABLET | Freq: Every day | ORAL | 5 refills | Status: AC | PRN

## 2023-02-19 NOTE — Assessment & Plan Note (Signed)
Chronic Using Maxalt prn Head CT if new problems

## 2023-02-19 NOTE — Assessment & Plan Note (Signed)
Possible OA vs other Ortho consult Meloxicam 15 mg po every day pc prn

## 2023-02-19 NOTE — Assessment & Plan Note (Signed)
Pt lost wt on compound Mounjaro

## 2023-02-19 NOTE — Progress Notes (Signed)
Subjective:  Patient ID: Bridget [name], female    DOB: [birth year]  Age: 47 y.o. MRN: [id number]  CC: Knee Pain   HPI Bridget [name] presents for L knee pain x 1 week Pt lost wt on compound Mounjaro C/u migraines   Outpatient Medications Prior to Visit  Medication Sig Dispense Refill   acetaminophen (TYLENOL) 500 MG tablet Take 1,000 mg by mouth every 6 (six) hours as needed for mild pain.     albuterol (VENTOLIN HFA) 108 (90 Base) MCG/ACT inhaler Inhale 1-2 puffs into the lungs every 6 (six) hours as needed for wheezing or shortness of breath. 18 g 0   cetirizine (ZYRTEC) 10 MG tablet Take 10 mg by mouth daily as needed for allergies.     Cholecalciferol (VITAMIN D3) 125 MCG (5000 UT) CAPS Take 1 capsule by mouth daily.     clonazePAM (KLONOPIN) 0.5 MG disintegrating tablet Take 1-2 tablets (0.5-1 mg total) by mouth 2 (two) times daily as needed (panic attacks). 60 tablet 1   Cyanocobalamin (VITAMIN B-12) 1000 MCG SUBL Place 1 tablet (1,000 mcg total) under the tongue daily. 100 tablet 3   cyclobenzaprine (FLEXERIL) 10 MG tablet Take 1 tablet (10 mg total) by mouth 2 (two) times daily as needed for muscle spasms. 20 tablet 0   fluticasone (FLONASE) 50 MCG/ACT nasal spray Place 2 sprays into both nostrils daily. 16 g 6   furosemide (LASIX) 20 MG tablet Take 1-2 tablets (20-40 mg total) by mouth daily as needed. 60 tablet 3   ibuprofen (ADVIL) 600 MG tablet Take 1 tablet (600 mg total) by mouth every 6 (six) hours as needed. 30 tablet 0   lidocaine (LIDODERM) 5 % Place 1 patch onto the skin daily. Remove & Discard patch within 12 hours or as directed by MD 30 patch 0   Multiple Vitamin (MULTIVITAMIN) capsule Take 1 capsule by mouth daily.     ondansetron (ZOFRAN) 4 MG tablet Take 1 tablet (4 mg total) by mouth every 8 (eight) hours as needed for nausea or vomiting. 24 tablet 2   pantoprazole (PROTONIX) 40 MG tablet Take 1 tablet (40 mg total) by mouth daily. 90 tablet 01   potassium  chloride SA (KLOR-CON M) 20 MEQ tablet Take 1 tablet (20 mEq total) by mouth 2 (two) times daily. TAKE ONE TABLET TWO TIMES A DAY 180 tablet 3   rivaroxaban (XARELTO) 20 MG TABS tablet Take by mouth.     traMADol (ULTRAM) 50 MG tablet Take 1 tablet (50 mg total) by mouth every 6 (six) hours as needed for severe pain. 20 tablet 0   triamterene-hydrochlorothiazide (MAXZIDE-25) 37.5-25 MG tablet Take 1 tablet by mouth daily.     rizatriptan (MAXALT) 10 MG tablet TAKE 1 TABLET BY MOUTH ONCE AS NEEDED FOR UP TO 1 DOSE FOR MIGRAINE. MAY REPEAT IN 2 HOURS IF NEEDED 4 tablet 2   No facility-administered medications prior to visit.    ROS: Review of Systems  Constitutional:  Negative for activity change, appetite change, chills, fatigue and unexpected weight change.  HENT:  Negative for congestion, mouth sores and sinus pressure.   Eyes:  Negative for visual disturbance.  Respiratory:  Negative for cough and chest tightness.   Gastrointestinal:  Negative for abdominal pain and nausea.  Genitourinary:  Negative for difficulty urinating, frequency and vaginal pain.  Musculoskeletal:  Positive for arthralgias, neck pain and neck stiffness. Negative for back pain and gait problem.  Skin:  Negative for pallor and  rash.  Neurological:  Positive for headaches. Negative for dizziness, tremors, weakness and numbness.  Psychiatric/Behavioral:  Negative for confusion and sleep disturbance.     Objective:  BP 118/78 (BP Location: Left Arm, Patient Position: Sitting, Cuff Size: Normal)   Pulse 78   Temp 98.3 F (36.8 C) (Oral)   Ht 5\' 4"  (1.626 m)   Wt 300 lb (136.1 kg)   SpO2 94%   BMI 51.49 kg/m   BP Readings from Last 3 Encounters:  02/19/23 118/78  01/19/23 (!) 160/86  12/31/22 112/70    Wt Readings from Last 3 Encounters:  02/19/23 300 lb (136.1 kg)  01/19/23 (!) 310 lb (140.6 kg)  12/31/22 (!) 308 lb (139.7 kg)    Physical Exam Constitutional:      General: She is not in acute  distress.    Appearance: She is well-developed. She is obese.  HENT:     Head: Normocephalic.     Right Ear: External ear normal.     Left Ear: External ear normal.     Nose: Nose normal.  Eyes:     General:        Right eye: No discharge.        Left eye: No discharge.     Conjunctiva/sclera: Conjunctivae normal.     Pupils: Pupils are equal, round, and reactive to light.  Neck:     Thyroid: No thyromegaly.     Vascular: No JVD.     Trachea: No tracheal deviation.  Cardiovascular:     Rate and Rhythm: Normal rate and regular rhythm.     Heart sounds: Normal heart sounds.  Pulmonary:     Effort: No respiratory distress.     Breath sounds: No stridor. No wheezing.  Abdominal:     General: Bowel sounds are normal. There is no distension.     Palpations: Abdomen is soft. There is no mass.     Tenderness: There is no abdominal tenderness. There is no guarding or rebound.  Musculoskeletal:        General: Tenderness present.     Cervical back: Normal range of motion and neck supple. No rigidity.  Lymphadenopathy:     Cervical: No cervical adenopathy.  Skin:    Findings: No erythema or rash.  Neurological:     Cranial Nerves: No cranial nerve deficit.     Motor: No abnormal muscle tone.     Coordination: Coordination normal.     Deep Tendon Reflexes: Reflexes normal.  Psychiatric:        Behavior: Behavior normal.        Thought Content: Thought content normal.        Judgment: Judgment normal.   L knee slighly tender w/ROM  Lab Results  Component Value Date   WBC 5.7 10/10/2022   HGB 13.7 10/10/2022   HCT 42.2 10/10/2022   PLT 329.0 10/10/2022   GLUCOSE 110 (H) 12/31/2022   CHOL 139 10/10/2022   TRIG 81.0 10/10/2022   HDL 47.80 10/10/2022   LDLCALC 75 10/10/2022   ALT 14 12/31/2022   AST 19 12/31/2022   NA 140 12/31/2022   K 3.6 12/31/2022   CL 104 12/31/2022   CREATININE 0.62 12/31/2022   BUN 16 12/31/2022   CO2 29 12/31/2022   TSH 0.74 10/10/2022    HGBA1C 5.3 12/31/2022    DG Foot Complete Right  Result Date: 01/19/2023 CLINICAL DATA:  Pain, bruising and swelling in the ball of the foot since yesterday. No  known injury. EXAM: RIGHT FOOT COMPLETE - 3+ VIEW COMPARISON:  Radiographs 08/04/2019 FINDINGS: The mineralization and alignment are normal. There is no evidence of acute fracture or dislocation. The joint spaces are preserved. There are chronic soft tissue calcifications anteriorly in the lower leg which are similar to the previous study. No foreign body or other focal soft tissue abnormality identified in the foot. IMPRESSION: No acute osseous findings or significant arthropathic changes. Chronic soft tissue calcifications anteriorly in the lower leg. Electronically Signed   By: Carey Bullocks M.D.   On: 01/19/2023 13:57    Assessment & Plan:   Problem List Items Addressed This Visit     Migraines    Chronic Using Maxalt prn Head CT if new problems      Relevant Medications   triamterene-hydrochlorothiazide (MAXZIDE-25) 37.5-25 MG tablet   meloxicam (MOBIC) 15 MG tablet   rizatriptan (MAXALT) 10 MG tablet   Morbid obesity with BMI of 50.0-59.9, adult (HCC)    Pt lost wt on compound Mounjaro      Knee pain, left - Primary    Possible OA vs other Ortho consult Meloxicam 15 mg po every day pc prn      Relevant Orders   Ambulatory referral to Orthopedic Surgery      Meds ordered this encounter  Medications   meloxicam (MOBIC) 15 MG tablet    Sig: Take 1 tablet (15 mg total) by mouth daily.    Dispense:  30 tablet    Refill:  0   rizatriptan (MAXALT) 10 MG tablet    Sig: Take 1 tablet (10 mg total) by mouth daily as needed for migraine. May repeat in 2 hours if needed    Dispense:  12 tablet    Refill:  5      Follow-up: Return in about 3 months (around 05/20/2023) for a follow-up visit.  Sonda Primes, MD

## 2023-03-10 ENCOUNTER — Encounter: Payer: Self-pay | Admitting: Family Medicine

## 2023-03-10 ENCOUNTER — Telehealth: Payer: 59 | Admitting: Family Medicine

## 2023-03-10 DIAGNOSIS — B9689 Other specified bacterial agents as the cause of diseases classified elsewhere: Secondary | ICD-10-CM

## 2023-03-10 DIAGNOSIS — J988 Other specified respiratory disorders: Secondary | ICD-10-CM | POA: Diagnosis not present

## 2023-03-10 MED ORDER — AMOXICILLIN-POT CLAVULANATE 875-125 MG PO TABS
1.0000 | ORAL_TABLET | Freq: Two times a day (BID) | ORAL | 0 refills | Status: AC
Start: 2023-03-10 — End: 2023-03-17

## 2023-03-10 NOTE — Progress Notes (Signed)
Virtual Visit via Video Note  I connected with Bridget [name] on 03/10/23 at 10:20 AM EST by a video enabled telemedicine application and verified that I am speaking with the correct person using two identifiers.  Patient Location: Home Provider Location: Office/Clinic  I discussed the limitations, risks, security, and privacy concerns of performing an evaluation and management service by video and the availability of in person appointments. I also discussed with the patient that there may be a patient responsible charge related to this service. The patient expressed understanding and agreed to proceed.  Subjective: PCP: Plotnikov, Georgina Quint, MD  Chief Complaint  Patient presents with   URI    Pressure in Head and facial, Congestion (yellow), slight cough, some upper body aches, no fever  Mucinex x 2 weeks - causing upset stomach     Patient complains of cough, head congestion, right ear clogged, nausea, loss of taste, and body aches. She denies fever. Onset of symptoms was 2 weeks ago, gradually worsening since that time. She is drinking plenty of fluids. Evaluation to date: none. Treatment to date: Mucinex.  She does not smoke.  She does report everyone else in her house has been sick as well, however she does not seem to be getting over it.   ROS: Per HPI  Current Outpatient Medications:    acetaminophen (TYLENOL) 500 MG tablet, Take 1,000 mg by mouth every 6 (six) hours as needed for mild pain., Disp: , Rfl:    albuterol (VENTOLIN HFA) 108 (90 Base) MCG/ACT inhaler, Inhale 1-2 puffs into the lungs every 6 (six) hours as needed for wheezing or shortness of breath., Disp: 18 g, Rfl: 0   cetirizine (ZYRTEC) 10 MG tablet, Take 10 mg by mouth daily as needed for allergies., Disp: , Rfl:    Cholecalciferol (VITAMIN D3) 125 MCG (5000 UT) CAPS, Take 1 capsule by mouth daily., Disp: , Rfl:    clonazePAM (KLONOPIN) 0.5 MG disintegrating tablet, Take 1-2 tablets (0.5-1 mg total) by mouth  2 (two) times daily as needed (panic attacks)., Disp: 60 tablet, Rfl: 1   Cyanocobalamin (VITAMIN B-12) 1000 MCG SUBL, Place 1 tablet (1,000 mcg total) under the tongue daily., Disp: 100 tablet, Rfl: 3   cyclobenzaprine (FLEXERIL) 10 MG tablet, Take 1 tablet (10 mg total) by mouth 2 (two) times daily as needed for muscle spasms., Disp: 20 tablet, Rfl: 0   ibuprofen (ADVIL) 600 MG tablet, Take 1 tablet (600 mg total) by mouth every 6 (six) hours as needed., Disp: 30 tablet, Rfl: 0   meloxicam (MOBIC) 15 MG tablet, Take 1 tablet (15 mg total) by mouth daily., Disp: 30 tablet, Rfl: 0   Multiple Vitamin (MULTIVITAMIN) capsule, Take 1 capsule by mouth daily., Disp: , Rfl:    pantoprazole (PROTONIX) 40 MG tablet, Take 1 tablet (40 mg total) by mouth daily., Disp: 90 tablet, Rfl: 01   potassium chloride SA (KLOR-CON M) 20 MEQ tablet, Take 1 tablet (20 mEq total) by mouth 2 (two) times daily. TAKE ONE TABLET TWO TIMES A DAY, Disp: 180 tablet, Rfl: 3   rizatriptan (MAXALT) 10 MG tablet, Take 1 tablet (10 mg total) by mouth daily as needed for migraine. May repeat in 2 hours if needed, Disp: 12 tablet, Rfl: 5   traMADol (ULTRAM) 50 MG tablet, Take 1 tablet (50 mg total) by mouth every 6 (six) hours as needed for severe pain., Disp: 20 tablet, Rfl: 0   triamterene-hydrochlorothiazide (MAXZIDE-25) 37.5-25 MG tablet, Take 1 tablet by  mouth daily., Disp: , Rfl:    fluticasone (FLONASE) 50 MCG/ACT nasal spray, Place 2 sprays into both nostrils daily. (Patient not taking: Reported on 03/10/2023), Disp: 16 g, Rfl: 6   ondansetron (ZOFRAN) 4 MG tablet, Take 1 tablet (4 mg total) by mouth every 8 (eight) hours as needed for nausea or vomiting. (Patient not taking: Reported on 03/10/2023), Disp: 24 tablet, Rfl: 2  Observations/Objective: There were no vitals filed for this visit. Physical Exam Constitutional:      General: She is not in acute distress.    Appearance: Normal appearance. She is not ill-appearing or  toxic-appearing.  Eyes:     General: No scleral icterus.       Right eye: No discharge.        Left eye: No discharge.     Conjunctiva/sclera: Conjunctivae normal.  Pulmonary:     Effort: Pulmonary effort is normal. No respiratory distress.  Neurological:     Mental Status: She is alert and oriented to person, place, and time.  Psychiatric:        Mood and Affect: Mood normal.        Behavior: Behavior normal.        Thought Content: Thought content normal.        Judgment: Judgment normal.     Assessment and Plan: 1. Bacterial respiratory infection (Primary) Education provided on upper respiratory infections.  Encouraged symptom management including cough syrup (Delsym), Tylenol/Ibuprofen, Vicks, and a humidifier at night.  - amoxicillin-clavulanate (AUGMENTIN) 875-125 MG tablet; Take 1 tablet by mouth 2 (two) times daily for 7 days.  Dispense: 14 tablet; Refill: 0   Follow Up Instructions: Return if symptoms worsen or fail to improve.   I discussed the assessment and treatment plan with the patient. The patient was provided an opportunity to ask questions, and all were answered. The patient agreed with the plan and demonstrated an understanding of the instructions.   The patient was advised to call back or seek an in-person evaluation if the symptoms worsen or if the condition fails to improve as anticipated.  The above assessment and management plan was discussed with the patient. The patient verbalized understanding of and has agreed to the management plan.   [name], FNP

## 2023-03-10 NOTE — Patient Instructions (Signed)
Cough syrup (Delsym), Tylenol/Ibuprofen, Vicks, and a humidifier at night.  

## 2023-04-29 ENCOUNTER — Emergency Department (HOSPITAL_BASED_OUTPATIENT_CLINIC_OR_DEPARTMENT_OTHER): Payer: 59

## 2023-04-29 ENCOUNTER — Encounter (HOSPITAL_BASED_OUTPATIENT_CLINIC_OR_DEPARTMENT_OTHER): Payer: Self-pay

## 2023-04-29 ENCOUNTER — Other Ambulatory Visit: Payer: Self-pay

## 2023-04-29 ENCOUNTER — Emergency Department (HOSPITAL_BASED_OUTPATIENT_CLINIC_OR_DEPARTMENT_OTHER)
Admission: EM | Admit: 2023-04-29 | Discharge: 2023-04-29 | Disposition: A | Discharge status: Home / Self Care | Payer: 59 | Attending: Emergency Medicine | Admitting: Emergency Medicine

## 2023-04-29 DIAGNOSIS — R079 Chest pain, unspecified: Secondary | ICD-10-CM | POA: Diagnosis present

## 2023-04-29 DIAGNOSIS — I2699 Other pulmonary embolism without acute cor pulmonale: Secondary | ICD-10-CM | POA: Diagnosis not present

## 2023-04-29 LAB — CBC WITH DIFFERENTIAL/PLATELET
Abs Immature Granulocytes: 0.02 10*3/uL (ref 0.00–0.07)
Basophils Absolute: 0 10*3/uL (ref 0.0–0.1)
Basophils Relative: 1 %
Eosinophils Absolute: 0.3 10*3/uL (ref 0.0–0.5)
Eosinophils Relative: 4 %
HCT: 40.2 % (ref 36.0–46.0)
Hemoglobin: 13.5 g/dL (ref 12.0–15.0)
Immature Granulocytes: 0 %
Lymphocytes Relative: 30 %
Lymphs Abs: 2.6 10*3/uL (ref 0.7–4.0)
MCH: 32.1 pg (ref 26.0–34.0)
MCHC: 33.6 g/dL (ref 30.0–36.0)
MCV: 95.5 fL (ref 80.0–100.0)
Monocytes Absolute: 0.6 10*3/uL (ref 0.1–1.0)
Monocytes Relative: 7 %
Neutro Abs: 5.1 10*3/uL (ref 1.7–7.7)
Neutrophils Relative %: 58 %
Platelets: 319 10*3/uL (ref 150–400)
RBC: 4.21 MIL/uL (ref 3.87–5.11)
RDW: 12.6 % (ref 11.5–15.5)
WBC: 8.7 10*3/uL (ref 4.0–10.5)
nRBC: 0 % (ref 0.0–0.2)

## 2023-04-29 LAB — HCG, SERUM, QUALITATIVE: Preg, Serum: NEGATIVE

## 2023-04-29 LAB — RESP PANEL BY RT-PCR (RSV, FLU A&B, COVID)  RVPGX2
Influenza A by PCR: NEGATIVE
Influenza B by PCR: NEGATIVE
Resp Syncytial Virus by PCR: NEGATIVE
SARS Coronavirus 2 by RT PCR: NEGATIVE

## 2023-04-29 LAB — BASIC METABOLIC PANEL
Anion gap: 8 (ref 5–15)
BUN: 11 mg/dL (ref 6–20)
CO2: 27 mmol/L (ref 22–32)
Calcium: 9.2 mg/dL (ref 8.9–10.3)
Chloride: 104 mmol/L (ref 98–111)
Creatinine, Ser: 0.85 mg/dL (ref 0.44–1.00)
GFR, Estimated: >60 mL/min (ref >60)
Glucose, Bld: 91 mg/dL (ref 70–99)
Potassium: 3.4 mmol/L — ABNORMAL LOW (ref 3.5–5.1)
Sodium: 139 mmol/L (ref 135–145)

## 2023-04-29 LAB — TROPONIN I (HIGH SENSITIVITY): Troponin I (High Sensitivity): 3 ng/L (ref <18)

## 2023-04-29 LAB — D-DIMER, QUANTITATIVE: D-Dimer, Quant: 1.19 ug{FEU}/mL — ABNORMAL HIGH (ref 0.00–0.50)

## 2023-04-29 MED ORDER — RIVAROXABAN (XARELTO) VTE STARTER PACK (15 & 20 MG): ORAL_TABLET | ORAL | 0 refills | Status: DC

## 2023-04-29 MED ORDER — IOHEXOL 350 MG/ML SOLN
75.0000 mL | Freq: Once | INTRAVENOUS | Status: AC | PRN
  Administered 2023-04-29: 75 mL via INTRAVENOUS

## 2023-04-29 MED ORDER — TRAMADOL HCL 50 MG PO TABS: 50.0000 mg | ORAL_TABLET | Freq: Four times a day (QID) | ORAL | 0 refills | Status: DC | PRN

## 2023-04-29 MED ORDER — RIVAROXABAN 15 MG PO TABS
15.0000 mg | ORAL_TABLET | Freq: Once | ORAL | Status: AC
  Administered 2023-04-29: 15 mg via ORAL
  Filled 2023-04-29: qty 1

## 2023-04-29 NOTE — Discharge Instructions (Addendum)
You were started on Xarelto which is a blood thinning medication for blood clot seen in the right lung on your CAT scan today.  Please take this as instructed with the initial starter pack.  You need to avoid taking any NSAID medications while you are on Xarelto.  NSAID medications commonly include ibuprofen, Motrin, Aleve, Advil, aspirin, and naproxen.  Tylenol is safe to take for pain.

## 2023-04-29 NOTE — ED Provider Notes (Signed)
[facility] Provider Note   CSN: [id number] Arrival date & time: 04/29/23  1942     History {Add pertinent medical, surgical, social history, OB history to HPI:1} Chief Complaint  Patient presents with   Shortness of Breath    Bridget [name] is a 48 y.o. female with history of obesity presenting to the ED with complaint of pleuritic left-sided chest pain that began approximately 3 days ago.  She is that this was associated lightheadedness, feeling pain in the left posterior back of her chest worse with deep inspiration.  It has been persistent since then.  She reports she had a viral type illness about 2 weeks ago but is no longer having fevers, chills or coughing.  She denies any lateral leg pain or swelling.  She does report she was diagnosed with a presumptive PE for 5 years ago due to an elevated D-dimer test, and was briefly on Xarelto, subsequently had a CT PE study did not show any obvious clot and was taken off of Xarelto.  Her current pain is predominantly on the left upper aspect of her back near the shoulder.  My review of external records the patient has had multiple PE studies including 2 in 2003 and 1 in 2004 that had no acute PE  HPI     Home Medications Prior to Admission medications   Medication Sig Start Date End Date Taking? Authorizing Provider  acetaminophen (TYLENOL) 500 MG tablet Take 1,000 mg by mouth every 6 (six) hours as needed for mild pain.    [provider]  albuterol (VENTOLIN HFA) 108 (90 Base) MCG/ACT inhaler Inhale 1-2 puffs into the lungs every 6 (six) hours as needed for wheezing or shortness of breath. 06/21/20   Olive Bass, FNP  cetirizine (ZYRTEC) 10 MG tablet Take 10 mg by mouth daily as needed for allergies.    [provider]  Cholecalciferol (VITAMIN D3) 125 MCG (5000 UT) CAPS Take 1 capsule by mouth daily.    [provider]  clonazePAM (KLONOPIN) 0.5 MG  disintegrating tablet Take 1-2 tablets (0.5-1 mg total) by mouth 2 (two) times daily as needed (panic attacks). 09/29/19   Plotnikov, Georgina Quint, MD  Cyanocobalamin (VITAMIN B-12) 1000 MCG SUBL Place 1 tablet (1,000 mcg total) under the tongue daily. 08/04/19   Plotnikov, Georgina Quint, MD  cyclobenzaprine (FLEXERIL) 10 MG tablet Take 1 tablet (10 mg total) by mouth 2 (two) times daily as needed for muscle spasms. 11/30/21   Tegeler, Canary Brim, MD  fluticasone (FLONASE) 50 MCG/ACT nasal spray Place 2 sprays into both nostrils daily. Patient not taking: Reported on 03/10/2023 05/30/21   Myrlene Broker, MD  ibuprofen (ADVIL) 600 MG tablet Take 1 tablet (600 mg total) by mouth every 6 (six) hours as needed. 01/19/23   Fayrene Helper, PA-C  meloxicam (MOBIC) 15 MG tablet Take 1 tablet (15 mg total) by mouth daily. 02/19/23   Plotnikov, Georgina Quint, MD  Multiple Vitamin (MULTIVITAMIN) capsule Take 1 capsule by mouth daily.    [provider]  ondansetron (ZOFRAN) 4 MG tablet Take 1 tablet (4 mg total) by mouth every 8 (eight) hours as needed for nausea or vomiting. Patient not taking: Reported on 03/10/2023 10/10/20   Plotnikov, Georgina Quint, MD  pantoprazole (PROTONIX) 40 MG tablet Take 1 tablet (40 mg total) by mouth daily. 11/06/20   Plotnikov, Georgina Quint, MD  potassium chloride SA (KLOR-CON M) 20 MEQ tablet Take 1 tablet (  20 mEq total) by mouth 2 (two) times daily. TAKE ONE TABLET TWO TIMES A DAY 10/10/22   Plotnikov, Georgina Quint, MD  rizatriptan (MAXALT) 10 MG tablet Take 1 tablet (10 mg total) by mouth daily as needed for migraine. May repeat in 2 hours if needed 02/19/23   Plotnikov, Georgina Quint, MD  traMADol (ULTRAM) 50 MG tablet Take 1 tablet (50 mg total) by mouth every 6 (six) hours as needed for severe pain. 08/07/22   Plotnikov, Georgina Quint, MD  triamterene-hydrochlorothiazide (MAXZIDE-25) 37.5-25 MG tablet Take 1 tablet by mouth daily. 01/08/23   [provider]      Allergies    Patient has  no known allergies.    Review of Systems   Review of Systems  Physical Exam Updated Vital Signs BP 138/72 (BP Location: Left Arm)   Pulse 80   Temp 97.9 F (36.6 C) (Oral)   Resp 18   Ht 5\' 4"  (1.626 m)   Wt 129.3 kg   SpO2 100%   BMI 48.92 kg/m  Physical Exam Constitutional:      General: She is not in acute distress.    Appearance: She is obese.  HENT:     Head: Normocephalic and atraumatic.  Eyes:     Conjunctiva/sclera: Conjunctivae normal.     Pupils: Pupils are equal, round, and reactive to light.  Cardiovascular:     Rate and Rhythm: Normal rate and regular rhythm.  Pulmonary:     Effort: Pulmonary effort is normal. No respiratory distress.  Abdominal:     General: There is no distension.     Tenderness: There is no abdominal tenderness.  Skin:    General: Skin is warm and dry.  Neurological:     General: No focal deficit present.     Mental Status: She is alert. Mental status is at baseline.  Psychiatric:        Mood and Affect: Mood normal.        Behavior: Behavior normal.     ED Results / Procedures / Treatments   Labs (all labs ordered are listed, but only abnormal results are displayed) Labs Reviewed  RESP PANEL BY RT-PCR (RSV, FLU A&B, COVID)  [id number]  BASIC METABOLIC PANEL  CBC WITH DIFFERENTIAL/PLATELET  D-DIMER, QUANTITATIVE  HCG, SERUM, QUALITATIVE  TROPONIN I (HIGH SENSITIVITY)    EKG EKG Interpretation Date/Time:  Tuesday April 29 2023 20:03:33 EST Ventricular Rate:  79 PR Interval:  157 QRS Duration:  86 QT Interval:  346 QTC Calculation: 397 R Axis:   42  Text Interpretation: Sinus rhythm Probable left atrial enlargement Abnormal R-wave progression, early transition Confirmed by Alvester Chou [phone]) on 04/29/2023 8:23:58 PM  Radiology No results found.  Procedures Procedures  {Document cardiac monitor, telemetry assessment procedure when appropriate:1}  Medications Ordered in ED Medications - No data to  display  ED Course/ Medical Decision Making/ A&P   {   Click here for ABCD2, HEART and other calculatorsREFRESH Note before signing :1}                              Medical Decision Making Amount and/or Complexity of Data Reviewed Labs: ordered. Radiology: ordered.   This patient presents to the ED with concern for pleuritic chest pain, shortness of breath. This involves an extensive number of treatment options, and is a complaint that carries with it a high risk of complications and morbidity.  The differential diagnosis  includes viral URI versus bacterial infection versus pulmonary embolism versus pneumothorax versus other  Co-morbidities that complicate the patient evaluation: History of prior potential PE at risk of thrombotic event  External records from outside source obtained and reviewed including CT PE studies from the past 2 years  I ordered and personally interpreted labs.  The pertinent results include:  ***  I ordered imaging studies including x-ray of the chest I independently visualized and interpreted imaging which showed *** I agree with the radiologist interpretation  The patient was maintained on a cardiac monitor.  I personally viewed and interpreted the cardiac monitored which showed an underlying rhythm of: Sinus rhythm, no hypoxia  Per my interpretation the patient's ECG shows no acute ischemic findings  I ordered medication including ***  for ***  I have reviewed the patients home medicines and have made adjustments as needed  Test Considered: ***  I requested consultation with the ***,  and discussed lab and imaging findings as well as pertinent plan - they recommend: ***  After the interventions noted above, I reevaluated the patient and found that they have: {resolved/improved/worsened:23923::"improved"}  Social Determinants of Health:***  Dispostion:  After consideration of the diagnostic results and the patients response to treatment, I feel  that the patent would benefit from ***.   {Document critical care time when appropriate:1} {Document review of labs and clinical decision tools ie heart score, Chads2Vasc2 etc:1}  {Document your independent review of radiology images, and any outside records:1} {Document your discussion with family members, caretakers, and with consultants:1} {Document social determinants of health affecting pt's care:1} {Document your decision making why or why not admission, treatments were needed:1} Final Clinical Impression(s) / ED Diagnoses Final diagnoses:  None    Rx / DC Orders ED Discharge Orders     None

## 2023-04-29 NOTE — ED Triage Notes (Signed)
Pt arrives with SOB that started 2 days ago. Pt reports pain in her upper back and lightheadedness. Pt denies CP. Pt was sick last week with viral illness. Pt report pain is worse on inspiration.

## 2023-05-01 ENCOUNTER — Telehealth: Payer: Self-pay

## 2023-05-01 NOTE — Telephone Encounter (Signed)
Called and spoke with patient. Informed her that Dr.Plotnikov will be out of the office until Tuesday. Advised her to call back and reach out to the triage line until then. Patient stated she would also be sending a mychart message in regarding advice on how to follow up on symptoms. Offered to schedule hospital follow up with Dr.Plotnikov or one of our other providers but she said she wanted to wait until she heard from him.

## 2023-05-01 NOTE — Telephone Encounter (Signed)
Copied from CRM [id number]. Topic: Clinical - Medical Advice >> May 01, 2023  2:48 PM Adaysia C wrote: Reason for CRM: Patient was seen in the hospital for blood clots on her lungs and would like to speak to the provider about what should be done from this point and she stated that her lungs are still bothering her. I offered the patient the options to speak with a nurse and she declined and said she rather speak to her provider as soon as she can. Please follow up with patient [phone]

## 2023-05-03 NOTE — Telephone Encounter (Signed)
Sch OV w/PC or w/Dr Sherene Sires (Pulmonary Med) Thx

## 2023-05-05 ENCOUNTER — Emergency Department (HOSPITAL_BASED_OUTPATIENT_CLINIC_OR_DEPARTMENT_OTHER): Payer: 59

## 2023-05-05 ENCOUNTER — Emergency Department (HOSPITAL_BASED_OUTPATIENT_CLINIC_OR_DEPARTMENT_OTHER)
Admission: EM | Admit: 2023-05-05 | Discharge: 2023-05-05 | Disposition: A | Discharge status: Home / Self Care | Payer: 59 | Attending: Emergency Medicine | Admitting: Emergency Medicine

## 2023-05-05 ENCOUNTER — Other Ambulatory Visit: Payer: Self-pay

## 2023-05-05 ENCOUNTER — Encounter (HOSPITAL_BASED_OUTPATIENT_CLINIC_OR_DEPARTMENT_OTHER): Payer: Self-pay | Admitting: Emergency Medicine

## 2023-05-05 DIAGNOSIS — Z7901 Long term (current) use of anticoagulants: Secondary | ICD-10-CM | POA: Insufficient documentation

## 2023-05-05 DIAGNOSIS — R0781 Pleurodynia: Secondary | ICD-10-CM | POA: Diagnosis present

## 2023-05-05 DIAGNOSIS — I10 Essential (primary) hypertension: Secondary | ICD-10-CM | POA: Diagnosis not present

## 2023-05-05 DIAGNOSIS — R072 Precordial pain: Secondary | ICD-10-CM

## 2023-05-05 LAB — CBC WITH DIFFERENTIAL/PLATELET
Abs Immature Granulocytes: 0.02 10*3/uL (ref 0.00–0.07)
Basophils Absolute: 0 10*3/uL (ref 0.0–0.1)
Basophils Relative: 1 %
Eosinophils Absolute: 0.3 10*3/uL (ref 0.0–0.5)
Eosinophils Relative: 4 %
HCT: 44.5 % (ref 36.0–46.0)
Hemoglobin: 14.7 g/dL (ref 12.0–15.0)
Immature Granulocytes: 0 %
Lymphocytes Relative: 29 %
Lymphs Abs: 1.8 10*3/uL (ref 0.7–4.0)
MCH: 32 pg (ref 26.0–34.0)
MCHC: 33 g/dL (ref 30.0–36.0)
MCV: 96.7 fL (ref 80.0–100.0)
Monocytes Absolute: 0.3 10*3/uL (ref 0.1–1.0)
Monocytes Relative: 5 %
Neutro Abs: 3.9 10*3/uL (ref 1.7–7.7)
Neutrophils Relative %: 61 %
Platelets: 344 10*3/uL (ref 150–400)
RBC: 4.6 MIL/uL (ref 3.87–5.11)
RDW: 12.6 % (ref 11.5–15.5)
WBC: 6.3 10*3/uL (ref 4.0–10.5)
nRBC: 0 % (ref 0.0–0.2)

## 2023-05-05 LAB — RESP PANEL BY RT-PCR (RSV, FLU A&B, COVID)  RVPGX2
Influenza A by PCR: NEGATIVE
Influenza B by PCR: NEGATIVE
Resp Syncytial Virus by PCR: NEGATIVE
SARS Coronavirus 2 by RT PCR: NEGATIVE

## 2023-05-05 LAB — COMPREHENSIVE METABOLIC PANEL
ALT: 18 U/L (ref 0–44)
AST: 19 U/L (ref 15–41)
Albumin: 4.3 g/dL (ref 3.5–5.0)
Alkaline Phosphatase: 40 U/L (ref 38–126)
Anion gap: 10 (ref 5–15)
BUN: 15 mg/dL (ref 6–20)
CO2: 27 mmol/L (ref 22–32)
Calcium: 9.1 mg/dL (ref 8.9–10.3)
Chloride: 99 mmol/L (ref 98–111)
Creatinine, Ser: 0.69 mg/dL (ref 0.44–1.00)
GFR, Estimated: >60 mL/min (ref >60)
Glucose, Bld: 90 mg/dL (ref 70–99)
Potassium: 3.5 mmol/L (ref 3.5–5.1)
Sodium: 136 mmol/L (ref 135–145)
Total Bilirubin: 1.6 mg/dL — ABNORMAL HIGH (ref 0.0–1.2)
Total Protein: 8.2 g/dL — ABNORMAL HIGH (ref 6.5–8.1)

## 2023-05-05 LAB — TROPONIN I (HIGH SENSITIVITY): Troponin I (High Sensitivity): 3 ng/L (ref <18)

## 2023-05-05 LAB — BRAIN NATRIURETIC PEPTIDE: B Natriuretic Peptide: 41.5 pg/mL (ref 0.0–100.0)

## 2023-05-05 LAB — HCG, SERUM, QUALITATIVE: Preg, Serum: NEGATIVE

## 2023-05-05 MED ORDER — LIDOCAINE 5 % EX PTCH
1.0000 | MEDICATED_PATCH | Freq: Every day | CUTANEOUS | 0 refills | Status: AC | PRN
Start: 2023-05-05 — End: ?

## 2023-05-05 MED ORDER — IOHEXOL 350 MG/ML SOLN
100.0000 mL | Freq: Once | INTRAVENOUS | Status: AC | PRN
  Administered 2023-05-05: 100 mL via INTRAVENOUS

## 2023-05-05 MED ORDER — ONDANSETRON HCL 4 MG/2ML IJ SOLN
4.0000 mg | Freq: Once | INTRAMUSCULAR | Status: AC
  Administered 2023-05-05: 4 mg via INTRAVENOUS
  Filled 2023-05-05: qty 2

## 2023-05-05 MED ORDER — HYDROMORPHONE HCL 1 MG/ML IJ SOLN: 1.0000 mg | Freq: Once | INTRAMUSCULAR | Status: DC

## 2023-05-05 MED ORDER — OXYCODONE HCL 5 MG PO TABS: 5.0000 mg | ORAL_TABLET | Freq: Four times a day (QID) | ORAL | 0 refills | Status: DC | PRN

## 2023-05-05 MED ORDER — ACETAMINOPHEN 500 MG PO TABS
1000.0000 mg | ORAL_TABLET | Freq: Once | ORAL | Status: AC
  Administered 2023-05-05: 1000 mg via ORAL
  Filled 2023-05-05: qty 2

## 2023-05-05 MED ORDER — SODIUM CHLORIDE 0.9 % IV BOLUS
1000.0000 mL | Freq: Once | INTRAVENOUS | Status: AC
  Administered 2023-05-05: 1000 mL via INTRAVENOUS

## 2023-05-05 MED ORDER — CYCLOBENZAPRINE HCL 5 MG PO TABS
5.0000 mg | ORAL_TABLET | Freq: Once | ORAL | Status: AC
  Administered 2023-05-05: 5 mg via ORAL
  Filled 2023-05-05: qty 1

## 2023-05-05 MED ORDER — ALBUTEROL SULFATE HFA 108 (90 BASE) MCG/ACT IN AERS: 2.0000 | INHALATION_SPRAY | RESPIRATORY_TRACT | Status: DC | PRN

## 2023-05-05 NOTE — ED Notes (Signed)
Pt. Reports she is here for on going back pain.  Pt. Was seen here for Blood clots and was placed on thinner therapy.  Pt. Still on therapy for clotting and reporting she is having SHOB with pain in her back.  Pt. Is in no distress at time of assessment and has no signs of SHOB during assessment.    Attempt. To establish IV site was unsuccessful.   EDP to try ultrasnd.

## 2023-05-05 NOTE — ED Triage Notes (Addendum)
States was dx with PE  on 2/11 is on blood thinners but states she doesn't feel well  and she is worried states  she feels faint and drove herself to hospital , pain is still there with meds

## 2023-05-05 NOTE — ED Provider Notes (Signed)
CT angio persistent but improved segmental pulmonary emboli in the right lower lobe.  No new or progressive pulmonary embolus.  That is very reassuring.  Patient's labs and vital signs otherwise are very stable.   Vanetta Mulders, MD 05/05/23 [phone]

## 2023-05-05 NOTE — ED Notes (Signed)
Earlier to pt. That the dilaudid ordered was a narcotic and she could not drive if given.  Pt. Understood.  Pt. Still c/o pain at present time.

## 2023-05-05 NOTE — Discharge Instructions (Addendum)
CT angio did not show any evidence of any new pulmonary embolism or any other acute findings.  The previous pulmonary embolism seems to be improving.  Follow-up with your doctor.  Take the pain medication as needed, stop taking tramadol, take oxycodone instead.   It was a pleasure caring for you today in the emergency department.  Please return to the emergency department for any worsening or worrisome symptoms.

## 2023-05-05 NOTE — ED Provider Notes (Signed)
[place] [facility]MENT AT [facility] Provider Note  CSN: [id number] Arrival date & time: 05/05/23 1001  Chief Complaint(s) Shortness of Breath  HPI Bridget [name] is a 48 y.o. female with past medical history as below, significant for obesity, PE, family history of DVT, anxiety and depression, hypertension who presents to the ED with complaint of chest pain  She was seen here on 2/11, was diagnosed with a PE, unclear chronicity.  Has a score is 45.  Was started on DOAC referred to hematology.  Patient reports they are today because she is having ongoing chest pain radiation to her right shoulder, pleuritic pain, pain is worsened over the past few days.  She has no significant dyspnea.  No fevers, chills, nausea vomiting or rash.  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   Shortness of breath 11/29/2021   Flank pain 12/01/2020   Acute sinusitis 11/15/2020   Perforated diverticulum 12/31/2019   Anticoagulation adequate 12/09/2019   Family history of DVT 12/09/2019   GERD (gastroesophageal reflux disease) 09/29/2019   Constipation 09/29/2019   Anticoagulated 09/06/2019   Morbid obesity with BMI of 50.0-59.9, adult (HCC) 09/06/2019   Foot pain, right 08/04/2019   BPPV (benign paroxysmal positional vertigo) 12/04/2018   Chest discomfort 09/16/2018   History of pulmonary embolus (PE) 07/22/2018   Pulmonary embolism (HCC) 07/22/2018   Anxiety and depression 07/13/2018   Arm swelling 07/06/2018   DOE (dyspnea on exertion) 06/30/2018   HTN (hypertension) 10/29/2017   Reactive depression (situational) 10/29/2017    Pruritic condition 09/25/2016   Urticaria 09/25/2016   Anemia 04/23/2016   Well adult exam 05/06/2014   Otitis media 12/13/2011   Fatigue 12/13/2011   B12 deficiency 12/13/2011   Vitamin D deficiency 12/13/2011   Palpitations 08/06/2010   Syncope and collapse 08/06/2010   Edema 10/14/2007   UTI 07/21/2007   LOW BACK PAIN 07/21/2007   ELEVATED BP 07/21/2007   Morbid (severe) obesity due to excess calories (HCC) 11/15/2006   Migraines 11/15/2006   Home Medication(s) Prior to Admission medications   Medication Sig Start Date End Date Taking? Authorizing Provider  acetaminophen (TYLENOL) 500 MG tablet Take 1,000 mg by mouth every 6 (six) hours as needed for mild pain.    [provider]  albuterol (VENTOLIN HFA) 108 (90 Base) MCG/ACT inhaler Inhale 1-2 puffs into the lungs every 6 (six) hours as needed for wheezing or shortness of breath. 06/21/20   Olive Bass, FNP  cetirizine (ZYRTEC) 10 MG tablet Take 10 mg by mouth daily as needed for allergies.    [provider]  Cholecalciferol (VITAMIN D3) 125 MCG (5000 UT) CAPS Take 1 capsule by mouth daily.    [provider]  clonazePAM (KLONOPIN) 0.5 MG disintegrating tablet Take 1-2 tablets (0.5-1 mg total) by mouth 2 (two) times daily as needed (panic attacks). 09/29/19   Plotnikov, Georgina Quint, MD  Cyanocobalamin (VITAMIN B-12) 1000 MCG SUBL Place 1 tablet (1,000 mcg total) under the tongue daily. 08/04/19   Plotnikov, Georgina Quint, MD  cyclobenzaprine (FLEXERIL) 10 MG tablet Take 1 tablet (  10 mg total) by mouth 2 (two) times daily as needed for muscle spasms. 11/30/21   Tegeler, Canary Brim, MD  fluticasone (FLONASE) 50 MCG/ACT nasal spray Place 2 sprays into both nostrils daily. Patient not taking: Reported on 03/10/2023 05/30/21   Myrlene Broker, MD  ibuprofen (ADVIL) 600 MG tablet Take 1 tablet (600 mg total) by mouth every 6 (six) hours as needed. 01/19/23   Fayrene Helper, PA-C  lidocaine (LIDODERM) 5  % Place 1 patch onto the skin daily as needed. Remove & Discard patch within 12 hours or as directed by MD 05/05/23  Yes Liberati Leiter, DO  meloxicam (MOBIC) 15 MG tablet Take 1 tablet (15 mg total) by mouth daily. 02/19/23   Plotnikov, Georgina Quint, MD  Multiple Vitamin (MULTIVITAMIN) capsule Take 1 capsule by mouth daily.    [provider]  ondansetron (ZOFRAN) 4 MG tablet Take 1 tablet (4 mg total) by mouth every 8 (eight) hours as needed for nausea or vomiting. Patient not taking: Reported on 03/10/2023 10/10/20   Plotnikov, Georgina Quint, MD  oxyCODONE (ROXICODONE) 5 MG immediate release tablet Take 1 tablet (5 mg total) by mouth every 6 (six) hours as needed. 05/05/23  Yes Tanda Rockers A, DO  pantoprazole (PROTONIX) 40 MG tablet Take 1 tablet (40 mg total) by mouth daily. 11/06/20   Plotnikov, Georgina Quint, MD  potassium chloride SA (KLOR-CON M) 20 MEQ tablet Take 1 tablet (20 mEq total) by mouth 2 (two) times daily. TAKE ONE TABLET TWO TIMES A DAY 10/10/22   Plotnikov, Georgina Quint, MD  RIVAROXABAN Carlena Hurl) VTE STARTER PACK (15 & 20 MG) Follow package directions: Take one 15mg  tablet by mouth twice a day. On day 22, switch to one 20mg  tablet once a day. Take with food. 04/30/23   Terald Sleeper, MD  rizatriptan (MAXALT) 10 MG tablet Take 1 tablet (10 mg total) by mouth daily as needed for migraine. May repeat in 2 hours if needed 02/19/23   Plotnikov, Georgina Quint, MD  triamterene-hydrochlorothiazide (MAXZIDE-25) 37.5-25 MG tablet Take 1 tablet by mouth daily. 01/08/23   [provider]                                                                                                                                    Past Surgical History Past Surgical History:  Procedure Laterality Date   BREAST REDUCTION SURGERY     BREAST SURGERY  2005   reduction   CESAREAN SECTION N/A 03/20/2016   Procedure: CESAREAN SECTION;  Surgeon: Gerald Leitz, MD;  Location: [facility] BIRTHING SUITES;  Service: Obstetrics;   Laterality: N/A;   COLONOSCOPY WITH PROPOFOL N/A 05/11/2020   Procedure: COLONOSCOPY WITH PROPOFOL;  Surgeon: Rachael Fee, MD;  Location: [facility] ENDOSCOPY;  Service: Endoscopy;  Laterality: N/A;   POLYPECTOMY  05/11/2020   Procedure: POLYPECTOMY;  Surgeon: Rachael Fee, MD;  Location: [facility] ENDOSCOPY;  Service: Endoscopy;;  TUBAL LIGATION Bilateral 03/20/2016   Procedure: BILATERAL TUBAL LIGATION;  Surgeon: Gerald Leitz, MD;  Location: [facility] BIRTHING SUITES;  Service: Obstetrics;  Laterality: Bilateral;   Family History Family History  Problem Relation Age of Onset   Deep vein thrombosis Father    Diabetes Father    Hypertension Mother    Hypertension Other    Diabetes Paternal Aunt    Heart disease Maternal Grandmother    Heart disease Maternal Grandfather    Diabetes Paternal Grandmother     Social History Social History   Tobacco Use   Smoking status: Never   Smokeless tobacco: Never  Vaping Use   Vaping status: Never Used  Substance Use Topics   Alcohol use: Not Currently   Drug use: No   Allergies Patient has no known allergies.  Review of Systems A thorough review of systems was obtained and all systems are negative except as noted in the HPI and PMH.   Physical Exam Vital Signs  I have reviewed the triage vital signs BP 112/81   Pulse 63   Temp 97.9 F (36.6 C)   Resp 16   Ht 5\' 4"  (1.626 m)   Wt 129.3 kg   LMP 04/25/2023   SpO2 100%   BMI 48.93 kg/m  Physical Exam Vitals and nursing note reviewed.  Constitutional:      General: She is not in acute distress.    Appearance: Normal appearance. She is well-developed. She is obese.  HENT:     Head: Normocephalic and atraumatic.     Right Ear: External ear normal.     Left Ear: External ear normal.     Nose: Nose normal.     Mouth/Throat:     Mouth: Mucous membranes are moist.  Eyes:     General: No scleral icterus.       Right eye: No discharge.        Left eye: No discharge.  Cardiovascular:     Rate  and Rhythm: Normal rate and regular rhythm.     Pulses: Normal pulses.     Heart sounds: Normal heart sounds.  Pulmonary:     Effort: Pulmonary effort is normal. No respiratory distress.     Breath sounds: Normal breath sounds. No stridor.  Abdominal:     General: Abdomen is flat. There is no distension.     Palpations: Abdomen is soft.     Tenderness: There is no abdominal tenderness.  Musculoskeletal:     Cervical back: No rigidity.     Right lower leg: No edema.     Left lower leg: No edema.  Skin:    General: Skin is warm and dry.     Capillary Refill: Capillary refill takes less than 2 seconds.  Neurological:     Mental Status: She is alert.  Psychiatric:        Mood and Affect: Mood normal.        Behavior: Behavior normal. Behavior is cooperative.     ED Results and Treatments Labs (all labs ordered are listed, but only abnormal results are displayed) Labs Reviewed  COMPREHENSIVE METABOLIC PANEL - Abnormal; Notable for the following components:      Result Value   Total Protein 8.2 (*)    Total Bilirubin 1.6 (*)    All other components within normal limits  RESP PANEL BY RT-PCR (RSV, FLU A&B, COVID)  RVPGX2  CBC WITH DIFFERENTIAL/PLATELET  BRAIN NATRIURETIC PEPTIDE  HCG, SERUM, QUALITATIVE  TROPONIN I (HIGH  SENSITIVITY)  TROPONIN I (HIGH SENSITIVITY)                                                                                                                          Radiology DG Chest 2 View Result Date: 05/05/2023 CLINICAL DATA:  Shortness of breath. EXAM: CHEST - 2 VIEW COMPARISON:  04/29/2023. FINDINGS: Apparent sharply marginated opacity overlying the right lower lateral hemithorax extends inferior to the ribcage and is secondary to skin fold/overlying objects. No discrete pneumothorax seen. Bilateral lung fields are clear. No dense consolidation or lung collapse. Bilateral costophrenic angles are clear. Normal cardio-mediastinal silhouette. No acute osseous  abnormalities. The soft tissues are within normal limits. IMPRESSION: No active cardiopulmonary disease. Electronically Signed   By: Jules Schick M.D.   On: 05/05/2023 11:47    Pertinent labs & imaging results that were available during my care of the patient were reviewed by me and considered in my medical decision making (see MDM for details).  Medications Ordered in ED Medications  albuterol (VENTOLIN HFA) 108 (90 Base) MCG/ACT inhaler 2 puff (has no administration in time range)  sodium chloride 0.9 % bolus 1,000 mL (0 mLs Intravenous Stopped 05/05/23 1451)  ondansetron (ZOFRAN) injection 4 mg (4 mg Intravenous Given 05/05/23 1307)  acetaminophen (TYLENOL) tablet 1,000 mg (1,000 mg Oral Given 05/05/23 1306)  iohexol (OMNIPAQUE) 350 MG/ML injection 100 mL (100 mLs Intravenous Contrast Given 05/05/23 1328)  cyclobenzaprine (FLEXERIL) tablet 5 mg (5 mg Oral Given 05/05/23 1451)                                                                                                                                     Procedures Procedures  (including critical care time)  Medical Decision Making / ED Course    Medical Decision Making:    Bridget [name] is a 48 y.o. female with past medical history as below, significant for obesity, PE, family history of DVT, anxiety and depression, hypertension who presents to the ED with complaint of chest pain. The complaint involves an extensive differential diagnosis and also carries with it a high risk of complications and morbidity.  Serious etiology was considered. Ddx includes but is not limited to: Differential includes all life-threatening causes for chest pain. This includes but is not exclusive to acute coronary syndrome, aortic dissection, pulmonary embolism, cardiac tamponade, community-acquired pneumonia, pericarditis, musculoskeletal chest wall pain, etc.  Complete initial physical exam performed, notably the patient was in no acute distress, no  hypoxia, no tachycardia, HDS.    Reviewed and confirmed nursing documentation for past medical history, family history, social history.  Vital signs reviewed.      Brief summary: 48 year old female history as above including recently diagnosed PE obesity here with ongoing chest pain.  She was seen on 2/11 with similar symptoms, found to have PE of unclear chronicity, she is compliant with DOAC.  She is still having ongoing pleuritic chest pain, unrelieved with tramadol.  Symptoms mildly worsened since last visit.  She has not yet follow-up with hematology or PCP  CT on 04/29/2023 with PE of multiple posterior lower lobe branches of the right pulmonary artery  She is well appearing overall, her labs are stable, still having some pleuritic chest pain. Does not want narcotics as she has to drive home. CTPE is pending, will adjust her home meds. Handoff to incoming EDP pending CTPE and recheck.             Additional history obtained: -Additional history obtained from na -External records from outside source obtained and reviewed including: Chart review including previous notes, labs, imaging, consultation notes including  Recent ER visit, home medications, prior labs and imaging CT on 04/29/2023 with PE of multiple posterior lower lobe branches of the right pulmonary artery  Lab Tests: -I ordered, reviewed, and interpreted labs.   The pertinent results include:   Labs Reviewed  COMPREHENSIVE METABOLIC PANEL - Abnormal; Notable for the following components:      Result Value   Total Protein 8.2 (*)    Total Bilirubin 1.6 (*)    All other components within normal limits  RESP PANEL BY RT-PCR (RSV, FLU A&B, COVID)  RVPGX2  CBC WITH DIFFERENTIAL/PLATELET  BRAIN NATRIURETIC PEPTIDE  HCG, SERUM, QUALITATIVE  TROPONIN I (HIGH SENSITIVITY)  TROPONIN I (HIGH SENSITIVITY)    Notable for labs stable  EKG   EKG Interpretation Date/Time:    Ventricular Rate:    PR Interval:    QRS  Duration:    QT Interval:    QTC Calculation:   R Axis:      Text Interpretation:           Imaging Studies ordered: I ordered imaging studies including ctpe I independently visualized the following imaging with scope of interpretation limited to determining acute life threatening conditions related to emergency care; findings noted above >> FORMAL READ PENDING   Medicines ordered and prescription drug management: Meds ordered this encounter  Medications   albuterol (VENTOLIN HFA) 108 (90 Base) MCG/ACT inhaler 2 puff   sodium chloride 0.9 % bolus 1,000 mL   DISCONTD: HYDROmorphone (DILAUDID) injection 1 mg   ondansetron (ZOFRAN) injection 4 mg   acetaminophen (TYLENOL) tablet 1,000 mg   iohexol (OMNIPAQUE) 350 MG/ML injection 100 mL   cyclobenzaprine (FLEXERIL) tablet 5 mg   oxyCODONE (ROXICODONE) 5 MG immediate release tablet    Sig: Take 1 tablet (5 mg total) by mouth every 6 (six) hours as needed.    Dispense:  10 tablet    Refill:  0   lidocaine (LIDODERM) 5 %    Sig: Place 1 patch onto the skin daily as needed. Remove & Discard patch within 12 hours or as directed by MD    Dispense:  15 patch    Refill:  0    -I have reviewed the patients home medicines and have made adjustments as needed  Consultations Obtained: na   Cardiac Monitoring: The patient was maintained on a cardiac monitor.  I personally viewed and interpreted the cardiac monitored which showed an underlying rhythm of: nsr Continuous pulse oximetry interpreted by myself, 100% on RA.    Social Determinants of Health:  Diagnosis or treatment significantly limited by social determinants of health: obesity   Reevaluation: After the interventions noted above, I reevaluated the patient and found that they have improved  Co morbidities that complicate the patient evaluation  Past Medical History:  Diagnosis Date   Constipation    Edema    Headache(784.0)    Morbid obesity (HCC)    Pulmonary  embolism (HCC)       Dispostion: Disposition decision including need for hospitalization was considered, and patient disposition pending at time of sign out.    Final Clinical Impression(s) / ED Diagnoses Final diagnoses:  Precordial chest pain        Channell Leiter, DO 05/05/23 1602

## 2023-05-06 NOTE — Telephone Encounter (Signed)
Pt is wanting to know if it is okay to take the Tirzepatide while taking Xarelto?

## 2023-05-06 NOTE — Telephone Encounter (Signed)
I was able to inform the pt of providers suggestions. Pt is going to contact Dr. Sherene Sires to get scheduled to see him.

## 2023-05-07 ENCOUNTER — Telehealth: Payer: Self-pay

## 2023-05-08 ENCOUNTER — Encounter: Payer: Self-pay | Admitting: Internal Medicine

## 2023-05-08 NOTE — Telephone Encounter (Signed)
 That is okay.  Thank you

## 2023-05-09 NOTE — Telephone Encounter (Signed)
First attempt to call patient for hospital follow up.

## 2023-05-16 ENCOUNTER — Ambulatory Visit (INDEPENDENT_AMBULATORY_CARE_PROVIDER_SITE_OTHER): Payer: 59

## 2023-05-16 ENCOUNTER — Ambulatory Visit: Payer: Self-pay | Admitting: Internal Medicine

## 2023-05-16 ENCOUNTER — Ambulatory Visit
Admission: EM | Admit: 2023-05-16 | Discharge: 2023-05-16 | Disposition: A | Discharge status: Home / Self Care | Payer: 59

## 2023-05-16 DIAGNOSIS — J40 Bronchitis, not specified as acute or chronic: Secondary | ICD-10-CM

## 2023-05-16 DIAGNOSIS — R051 Acute cough: Secondary | ICD-10-CM | POA: Diagnosis not present

## 2023-05-16 DIAGNOSIS — J329 Chronic sinusitis, unspecified: Secondary | ICD-10-CM

## 2023-05-16 MED ORDER — DOXYCYCLINE HYCLATE 100 MG PO CAPS: 100.0000 mg | ORAL_CAPSULE | Freq: Two times a day (BID) | ORAL | 0 refills | Status: DC

## 2023-05-16 NOTE — Discharge Instructions (Signed)
 You do not have any pneumonia based on your x-ray.  We are going to start an antibiotic to cover for sinus/bronchitis infection.  Take doxycycline 100 mg twice daily for 10 days.  Stay out of the sun while on this medication.  Continue over-the-counter medication including Mucinex, nasal saline/sinus rinses.  Make sure you rest and drink plenty of fluid.  If your symptoms are not improving within a week or if anything worsens and you have fever, worsening cough, shortness of breath, chest pain, nausea/vomiting interfering with oral intake you need to be seen immediately.

## 2023-05-16 NOTE — ED Provider Notes (Signed)
 [facility]    CSN: [id number] Arrival date & time: 05/16/23  1649      History   Chief Complaint Chief Complaint  Patient presents with   Otalgia   Sore Throat    HPI Bridget [name] is a 48 y.o. female.   Patient presents today for evaluation of several weeks of cough.  She reports that several weeks ago she initially had cold-like symptoms but then developed pleuritic chest pain with associated shortness of breath.  She was seen in the emergency room and diagnosed with pulmonary embolism.  She has been started on Xarelto and has been compliant with this medication without missing doses.  She continued to have some discomfort and so was seen again 05/05/2023 which showed interval improvement of segmental PE.  She reports the symptoms have improved but she continues to have cough, sinus pressure, drainage, congestion and is concerned that there might be an infection that has not been addressed.  She has been taking Mucinex without improvement of symptoms.  She denies history of asthma, allergies, COPD, smoking.  She denies any recent antibiotics or steroids; was last treated with Augmentin via video visit December 2024.  She has no concern for pregnancy.    Past Medical History:  Diagnosis Date   Constipation    Edema    Headache(784.0)    Morbid obesity (HCC)    Pulmonary embolism 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   Shortness of breath 11/29/2021   Flank pain 12/01/2020   Acute sinusitis 11/15/2020   Perforated diverticulum 12/31/2019   Anticoagulation adequate 12/09/2019   Family history of DVT 12/09/2019   GERD (gastroesophageal reflux disease) 09/29/2019   Constipation 09/29/2019   Anticoagulated 09/06/2019   Morbid obesity with BMI of 50.0-59.9, adult (HCC) 09/06/2019   Foot pain, right 08/04/2019   BPPV (benign paroxysmal  positional vertigo) 12/04/2018   Chest discomfort 09/16/2018   History of pulmonary embolus (PE) 07/22/2018   Pulmonary embolism (HCC) 07/22/2018   Anxiety and depression 07/13/2018   Arm swelling 07/06/2018   DOE (dyspnea on exertion) 06/30/2018   HTN (hypertension) 10/29/2017   Reactive depression (situational) 10/29/2017   Pruritic condition 09/25/2016   Urticaria 09/25/2016   Anemia 04/23/2016   Well adult exam 05/06/2014   Otitis media 12/13/2011   Fatigue 12/13/2011   B12 deficiency 12/13/2011   Vitamin D deficiency 12/13/2011   Palpitations 08/06/2010   Syncope and collapse 08/06/2010   Edema 10/14/2007   UTI 07/21/2007   LOW BACK PAIN 07/21/2007   ELEVATED BP 07/21/2007   Morbid (severe) obesity due to excess calories (HCC) 11/15/2006   Migraines 11/15/2006    Past Surgical History:  Procedure Laterality Date   BREAST REDUCTION SURGERY     BREAST SURGERY  2005   reduction   CESAREAN SECTION N/A 03/20/2016   Procedure: CESAREAN SECTION;  Surgeon: Gerald Leitz, MD;  Location: [facility]IRTHING SU[facility];  Service: Obstetrics;  Laterality: N/A;   COLONOSCOPY WITH PROPOFOL N/A 05/11/2020   Procedure: COLONOSCOPY WITH PROPOFOL;  Surgeon: Rachael Fee, MD;  Location: [facility] ENDOSCOPY;  Service: Endoscopy;  Laterality: N/A;   POLYPECTOMY  05/11/2020   Procedure: POLYPECTOMY;  Surgeon: Rachael Fee, MD;  Location: [facility] ENDOSCOPY;  Service: Endoscopy;;   TUBAL LIGATION Bilateral 03/20/2016   Procedure: BILATERAL TUBAL LIGATION;  Surgeon: Gerald Leitz, MD;  Location:  [facility] BIRTHING SUITES;  Service: Obstetrics;  Laterality: Bilateral;    OB History     Gravida  7   Para  2   Term  2   Preterm      AB  5   Living  2      SAB  3   IAB  2   Ectopic      Multiple  0   Live Births  2            Home Medications    Prior to Admission medications   Medication Sig Start Date End Date Taking? Authorizing Provider  Cholecalciferol (VITAMIN D3) 125 MCG (5000 UT) CAPS Take 1  capsule by mouth daily.   Yes [provider]  Cyanocobalamin (VITAMIN B-12) 1000 MCG SUBL Place 1 tablet (1,000 mcg total) under the tongue daily. 08/04/19  Yes Plotnikov, Georgina Quint, MD  doxycycline (VIBRAMYCIN) 100 MG capsule Take 1 capsule (100 mg total) by mouth 2 (two) times daily. 05/16/23  Yes Miesha Bachmann K, PA-C  pantoprazole (PROTONIX) 40 MG tablet Take 1 tablet (40 mg total) by mouth daily. 11/06/20  Yes Plotnikov, Georgina Quint, MD  potassium chloride SA (KLOR-CON M) 20 MEQ tablet Take 1 tablet (20 mEq total) by mouth 2 (two) times daily. TAKE ONE TABLET TWO TIMES A DAY 10/10/22  Yes Plotnikov, Georgina Quint, MD  RIVAROXABAN Carlena Hurl) VTE STARTER PACK (15 & 20 MG) Follow package directions: Take one 15mg  tablet by mouth twice a day. On day 22, switch to one 20mg  tablet once a day. Take with food. 04/30/23  Yes Terald Sleeper, MD  triamterene-hydrochlorothiazide (MAXZIDE-25) 37.5-25 MG tablet Take 1 tablet by mouth daily. 01/08/23  Yes [provider]  acetaminophen (TYLENOL) 500 MG tablet Take 1,000 mg by mouth every 6 (six) hours as needed for mild pain.    [provider]  albuterol (VENTOLIN HFA) 108 (90 Base) MCG/ACT inhaler Inhale 1-2 puffs into the lungs every 6 (six) hours as needed for wheezing or shortness of breath. 06/21/20   Olive Bass, FNP  cetirizine (ZYRTEC) 10 MG tablet Take 10 mg by mouth daily as needed for allergies.    [provider]  clonazePAM (KLONOPIN) 0.5 MG disintegrating tablet Take 1-2 tablets (0.5-1 mg total) by mouth 2 (two) times daily as needed (panic attacks). 09/29/19   Plotnikov, Georgina Quint, MD  cyclobenzaprine (FLEXERIL) 10 MG tablet Take 1 tablet (10 mg total) by mouth 2 (two) times daily as needed for muscle spasms. 11/30/21   Tegeler, Canary Brim, MD  furosemide (LASIX) 20 MG tablet TAKE 1 TO 2 TABLETS BY MOUTH ONCE DAILY AS NEEDED    [provider]  ibuprofen (ADVIL) 600 MG tablet Take 1 tablet (600 mg  total) by mouth every 6 (six) hours as needed. 01/19/23   Fayrene Helper, PA-C  lidocaine (LIDODERM) 5 % Place 1 patch onto the skin daily as needed. Remove & Discard patch within 12 hours or as directed by MD 05/05/23   Bracken Leiter, DO  meloxicam (MOBIC) 15 MG tablet Take 1 tablet (15 mg total) by mouth daily. 02/19/23   Plotnikov, Georgina Quint, MD  Multiple Vitamin (MULTIVITAMIN) capsule Take 1 capsule by mouth daily.    [provider]  oxyCODONE (ROXICODONE) 5 MG immediate release tablet Take 1 tablet (5 mg total) by mouth every 6 (six) hours as needed. 05/05/23   Bastos Leiter, DO  rizatriptan (MAXALT) 10 MG tablet Take 1 tablet (10  mg total) by mouth daily as needed for migraine. May repeat in 2 hours if needed 02/19/23   Plotnikov, Georgina Quint, MD  Tirzepatide-Weight Management [facility])     [provider]  traMADol (ULTRAM) 50 MG tablet Take 1 tablet by mouth every 6 (six) hours as needed.    [provider]    Family History Family History  Problem Relation Age of Onset   Deep vein thrombosis Father    Diabetes Father    Hypertension Mother    Hypertension Other    Diabetes Paternal Aunt    Heart disease Maternal Grandmother    Heart disease Maternal Grandfather    Diabetes Paternal Grandmother     Social History Social History   Tobacco Use   Smoking status: Never   Smokeless tobacco: Never  Vaping Use   Vaping status: Never Used  Substance Use Topics   Alcohol use: Not Currently   Drug use: No     Allergies   Meloxicam   Review of Systems Review of Systems  Constitutional:  Positive for activity change. Negative for appetite change, fatigue and fever.  HENT:  Positive for congestion, postnasal drip and sinus pressure. Negative for sneezing and sore throat.   Respiratory:  Positive for cough. Negative for shortness of breath.   Cardiovascular:  Negative for chest pain.  Gastrointestinal:  Negative for abdominal pain, diarrhea, nausea and  vomiting.  Neurological:  Positive for headaches. Negative for dizziness and light-headedness.     Physical Exam Triage Vital Signs ED Triage Vitals  Encounter Vitals Group     BP 05/16/23 1704 111/77     Systolic BP Percentile --      Diastolic BP Percentile --      Pulse Rate 05/16/23 1704 81     Resp 05/16/23 1704 20     Temp 05/16/23 1704 97.9 F (36.6 C)     Temp Source 05/16/23 1704 Oral     SpO2 05/16/23 1704 98 %     Weight 05/16/23 1659 285 lb (129.3 kg)     Height 05/16/23 1659 5\' 4"  (1.626 m)     Head Circumference --      Peak Flow --      Pain Score 05/16/23 1656 6     Pain Loc --      Pain Education --      Exclude from Growth Chart --    No data found.  Updated Vital Signs BP 111/77 (BP Location: Right Arm)   Pulse 81   Temp 97.9 F (36.6 C) (Oral)   Resp 20   Ht 5\' 4"  (1.626 m)   Wt 285 lb (129.3 kg)   LMP 04/25/2023   SpO2 98%   BMI 48.92 kg/m   Visual Acuity Right Eye Distance:   Left Eye Distance:   Bilateral Distance:    Right Eye Near:   Left Eye Near:    Bilateral Near:     Physical Exam Vitals reviewed.  Constitutional:      General: She is awake. She is not in acute distress.    Appearance: Normal appearance. She is well-developed. She is not ill-appearing.     Comments: Very pleasant female appears stated age in no acute distress sitting comfortably in exam room  HENT:     Head: Normocephalic and atraumatic.     Right Ear: Tympanic membrane, ear canal and external ear normal. Tympanic membrane is not erythematous or bulging.     Left Ear:  Tympanic membrane, ear canal and external ear normal. Tympanic membrane is not erythematous or bulging.     Nose:     Right Sinus: Maxillary sinus tenderness present. No frontal sinus tenderness.     Left Sinus: Maxillary sinus tenderness present. No frontal sinus tenderness.     Mouth/Throat:     Pharynx: Uvula midline. Postnasal drip present. No oropharyngeal exudate or posterior  oropharyngeal erythema.  Cardiovascular:     Rate and Rhythm: Normal rate and regular rhythm.     Heart sounds: Normal heart sounds, S1 normal and S2 normal. No murmur heard. Pulmonary:     Effort: Pulmonary effort is normal.     Breath sounds: Normal breath sounds. No wheezing, rhonchi or rales.     Comments: Clear to auscultation bilaterally Psychiatric:        Behavior: Behavior is cooperative.      UC Treatments / Results  Labs (all labs ordered are listed, but only abnormal results are displayed) Labs Reviewed - No data to display  EKG   Radiology DG Chest 2 View Result Date: 05/16/2023 CLINICAL DATA:  Worsening cough. EXAM: CHEST - 2 VIEW COMPARISON:  05/05/2023 FINDINGS: Heart size and pulmonary vascularity are normal. Lungs are clear. No pleural effusion or pneumothorax. Mediastinal contours appear intact. Thoracolumbar scoliosis convex towards the right. Surgical clips in the left upper quadrant. IMPRESSION: No active cardiopulmonary disease. Electronically Signed   By: Burman Nieves M.D.   On: 05/16/2023 17:40    Procedures Procedures (including critical care time)  Medications Ordered in UC Medications - No data to display  Initial Impression / Assessment and Plan / UC Course  I have reviewed the triage vital signs and the nursing notes.  Pertinent labs & imaging results that were available during my care of the patient were reviewed by me and considered in my medical decision making (see chart for details).     Patient is well-appearing, afebrile, nontoxic, nontachycardic.  Low suspicion for worsening PE given she has had interval improvement following initiation of anticoagulation and reports compliance with her medication.  Her vital signs are stable in clinic today.  Chest x-ray was obtained to rule out pneumonia and was negative in clinic today.  Given her prolonged and worsening symptoms will cover for sinobronchitis.  She was treated with Augmentin in the  past 90 days so we will start doxycycline 100 mg twice daily for 10 days.  We discussed that she should stay out of the sun while on this medication due to associated photosensitivity.  Recommend that she use over-the-counter medication including Mucinex and nasal saline.  She is to rest and drink plenty of fluid.  If her symptoms are not improving within a week or if anything worsens and she has high fever, worsening cough, shortness of breath, chest pain, nausea/vomiting interfering with oral intake she needs to be seen immediately.  Strict return precautions given.  Excuse note provided.  Final Clinical Impressions(s) / UC Diagnoses   Final diagnoses:  Acute cough  Sinobronchitis     Discharge Instructions      You do not have any pneumonia based on your x-ray.  We are going to start an antibiotic to cover for sinus/bronchitis infection.  Take doxycycline 100 mg twice daily for 10 days.  Stay out of the sun while on this medication.  Continue over-the-counter medication including Mucinex, nasal saline/sinus rinses.  Make sure you rest and drink plenty of fluid.  If your symptoms are not improving within  a week or if anything worsens and you have fever, worsening cough, shortness of breath, chest pain, nausea/vomiting interfering with oral intake you need to be seen immediately.     ED Prescriptions     Medication Sig Dispense Auth. Provider   doxycycline (VIBRAMYCIN) 100 MG capsule Take 1 capsule (100 mg total) by mouth 2 (two) times daily. 20 capsule Unice Vantassel, Noberto Retort, PA-C      PDMP not reviewed this encounter.   Jeani Hawking, PA-C 05/16/23 1802

## 2023-05-16 NOTE — ED Triage Notes (Signed)
"  This started back on the 11th, diagnosed with blood clots in my lungs, they asked me questions about a virus and did covid19 testing at that time, negative". "For the last few weeks, I have been treating this same cold, right ear is hurting me now, sore/aching throat at times, chest congestion with trouble breathing in (deep breath) at times due to this Cough". No fever (presently) known. "I am concerned for Pna".

## 2023-05-16 NOTE — Telephone Encounter (Deleted)
 Additional Information . Commented on: [1] MILD difficulty breathing (e.g., minimal/no SOB at rest, SOB with walking, pulse <100) AND [2] still present when not coughing    Mild difficulty breathing is NOT present when not coughing.  Protocols used: Cough - Acute Productive-A-AH

## 2023-05-16 NOTE — Telephone Encounter (Addendum)
 Chief Complaint: cough Symptoms: cough, thick clear mucus, sore throat, R ear congestion Frequency: 2 wks Pertinent Negatives: Patient denies SOB at rest, CP on inspiration similar to with PE, dizziness, weakness, fever  Disposition: [] ED /[x] Urgent Care (no appt availability in office) / [] Appointment(In office/virtual)/ []  [facility]/ [] Home Care/ [] Refused Recommended Disposition /[] [place] [facility]/ []  Follow-up with PCP Additional Notes: Pt reports 2 wks of cold symptoms. Pt endorses a sore throat, R ear congestion ("stopped up"), a cough with thick clear mucus, that she "sometimes can't take a deep breath" with coughing and chest pressure only with coughing. Pt was diagnosed with a PE on 2/11 and has been taking Xarelto with no missed doses. Pt denies CP with deep inspiration. States her symptoms today feel very different than when she was diagnosed with a blood clot.  No SOB at rest. No SOB using the stairs today. Pt endorses she "sometimes can't take a deep breath" while coughing. O2 today was 97 with a pulse ox. No dizziness, no weakness. Per protocol for mild difficulty breathing (only with cough) and given pt's medical hx, pt advised to be seen within 4 hrs. No available appts today. RN advised pt go to the UC. Pt agreeable. RN provided pt the wait time and address for [facility], she said she will go now. RN advised pt she needs to call 911 if she develops SOB without coughing, CP, or is she has any worsening, to which she verbalized understanding.    Copied from CRM [id number]. Topic: Clinical - Red Word Triage >> May 16, 2023  2:19 PM Rodman Pickle T wrote: Red Word that prompted transfer to Nurse Triage: patient is feeling really bad she has  blood clots at the moment she has a sore throat and her right ear  is stopped up      she is coughing as well Reason for Disposition  [1] MILD difficulty breathing (e.g., minimal/no SOB at rest, SOB with walking, pulse <100)  AND [2] still present when not coughing  Answer Assessment - Initial Assessment Questions 1. RESPIRATORY STATUS: "Describe your breathing?" (e.g., wheezing, shortness of breath, unable to speak, severe coughing)      "Sometimes can't take a deep breath" 2. ONSET: "When did this breathing problem begin?"      Cold symptoms 2 wks ago 3. PATTERN "Does the difficult breathing come and go, or has it been constant since it started?"      No SOB at rest right now 4. SEVERITY: "How bad is your breathing?" (e.g., mild, moderate, severe)    - MILD: No SOB at rest, mild SOB with walking, speaks normally in sentences, can lie down, no retractions, pulse < 100.    - MODERATE: SOB at rest, SOB with minimal exertion and prefers to sit, cannot lie down flat, speaks in phrases, mild retractions, audible wheezing, pulse 100-120.    - SEVERE: Very SOB at rest, speaks in single words, struggling to breathe, sitting hunched forward, retractions, pulse > 120      No SOB at rest, states she did not any SOB using the stairs just now. "Can't take a deep breath" while coughing  5. RECURRENT SYMPTOM: "Have you had difficulty breathing before?" If Yes, ask: "When was the last time?" and "What happened that time?"      Pain on inspiration when she was diagnosed with a blood clot (states this feels different) 6. CARDIAC HISTORY: "Do you have any history of heart disease?" (  e.g., heart attack, angina, bypass surgery, angioplasty)      HTN 7. LUNG HISTORY: "Do you have any history of lung disease?"  (e.g., pulmonary embolus, asthma, emphysema)     PE on Xarelto - no missed doses  8. CAUSE: "What do you think is causing the breathing problem?"      States she has a cold 9. OTHER SYMPTOMS: "Do you have any other symptoms? (e.g., dizziness, runny nose, cough, chest pain, fever)     States this does not feel like it did with her blood clot, cold 2 wks ago, using Mucinex, R ear stopped up, cough, thick clear mucus, some chest  pressure just with coughing 10. O2 SATURATION MONITOR:  "Do you use an oxygen saturation monitor (pulse oximeter) at home?" If Yes, ask: "What is your reading (oxygen level) today?" "What is your usual oxygen saturation reading?" (e.g., 95%)       97 today  Answer Assessment - Initial Assessment Questions 1. ONSET: "When did the cough begin?"      2 wks ago 3. SPUTUM: "Describe the color of your sputum" (none, dry cough; clear, white, yellow, green)     Clear, thick 4. HEMOPTYSIS: "Are you coughing up any blood?" If so ask: "How much?" (flecks, streaks, tablespoons, etc.)     No 5. DIFFICULTY BREATHING: "Are you having difficulty breathing?" If Yes, ask: "How bad is it?" (e.g., mild, moderate, severe)    - MILD: No SOB at rest, mild SOB with walking, speaks normally in sentences, can lie down, no retractions, pulse < 100.    - MODERATE: SOB at rest, SOB with minimal exertion and prefers to sit, cannot lie down flat, speaks in phrases, mild retractions, audible wheezing, pulse 100-120.    - SEVERE: Very SOB at rest, speaks in single words, struggling to breathe, sitting hunched forward, retractions, pulse > 120      None at rest. None with stairs today. Pt states she "sometimes can't take a deep breath" when coughing. 6. FEVER: "Do you have a fever?" If Yes, ask: "What is your temperature, how was it measured, and when did it start?"     No 7. CARDIAC HISTORY: "Do you have any history of heart disease?" (e.g., heart attack, congestive heart failure)      HTN 8. LUNG HISTORY: "Do you have any history of lung disease?"  (e.g., pulmonary embolus, asthma, emphysema)     PE on Xarelto 9. PE RISK FACTORS: "Do you have a history of blood clots?" (or: recent major surgery, recent prolonged travel, bedridden)     Yes 10. OTHER SYMPTOMS: "Do you have any other symptoms?" (e.g., runny nose, wheezing, chest pain)       R ear congestion, sore throat, cough  Protocols used: Breathing Difficulty-A-AH,  Cough - Acute Productive-A-AH

## 2023-05-21 ENCOUNTER — Inpatient Hospital Stay: Payer: 59 | Admitting: Internal Medicine

## 2023-05-22 ENCOUNTER — Ambulatory Visit: Payer: 59 | Admitting: Internal Medicine

## 2023-05-22 ENCOUNTER — Encounter: Payer: Self-pay | Admitting: Internal Medicine

## 2023-05-22 ENCOUNTER — Telehealth: Payer: Self-pay | Admitting: Internal Medicine

## 2023-05-22 VITALS — BP 128/86 | HR 87 | Temp 97.6°F | Ht 64.0 in | Wt 290.0 lb

## 2023-05-22 DIAGNOSIS — I2699 Other pulmonary embolism without acute cor pulmonale: Secondary | ICD-10-CM

## 2023-05-22 MED ORDER — RIVAROXABAN 15 MG PO TABS: 15.0000 mg | ORAL_TABLET | Freq: Two times a day (BID) | ORAL | 10 refills | Status: DC

## 2023-05-22 NOTE — Telephone Encounter (Signed)
 Pt forgot to ask how long you want her to continue taking the xarelto

## 2023-05-22 NOTE — Patient Instructions (Signed)
 Continue anticoagulation therapy for pulmonary embolism We will check oxygen levels at nighttime with overnight pulse oximetry Follow-up with hematologist next week  Avoid Allergens and Irritants Avoid secondhand smoke Avoid SICK contacts Recommend  Masking  when appropriate Recommend Keep up-to-date with vaccinations

## 2023-05-22 NOTE — Progress Notes (Signed)
 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  She was seen in the emergency room and diagnosed with pulmonary embolism. She has been started on Xarelto and has been compliant with this medication without missing doses.  She continued to have some discomfort and so was seen again 05/05/2023 which showed interval improvement of segmental PE.   She reports the symptoms have improved but she continues to have cough, sinus pressure, drainage, congestion and is concerned that there might be an infection that has not been addressed.    She has been taking Mucinex without improvement of symptoms. She denies history of asthma, allergies, COPD, smoking, She denies any recent antibiotics or steroids  In 2020 patient may have had COVID and had an elevated D-dimer and was placed on Xarelto she took Xarelto until 2023  No exacerbation at this time No evidence of heart failure at this time No evidence or signs of infection at this time No respiratory distress No fevers, chills, nausea, vomiting, diarrhea No evidence of lower extremity edema No evidence hemoptysis  PAST MEDICAL HISTORY   Past Medical History:  Diagnosis Date   Constipation    Edema    Headache(784.0)    Morbid obesity (HCC)    Pulmonary embolism (HCC)      SURGICAL HISTORY   Past Surgical History:  Procedure Laterality Date   BREAST REDUCTION SURGERY     BREAST SURGERY   2005   reduction   CESAREAN SECTION N/A 03/20/2016   Procedure: CESAREAN SECTION;  Surgeon: Gerald Leitz, MD;  Location: [facility] BIRTHING SUITES;  Service: Obstetrics;  Laterality: N/A;   COLONOSCOPY WITH PROPOFOL N/A 05/11/2020   Procedure: COLONOSCOPY WITH PROPOFOL;  Surgeon: Rachael Fee, MD;  Location: [facility] ENDOSCOPY;  Service: Endoscopy;  Laterality: N/A;   POLYPECTOMY  05/11/2020   Procedure: POLYPECTOMY;  Surgeon: Rachael Fee, MD;  Location: [facility];  Service: Endoscopy;;   TUBAL LIGATION Bilateral 03/20/2016   Procedure: BILATERAL TUBAL LIGATION;  Surgeon: Gerald Leitz, MD;  Location: [facility] BIRTHING SUITES;  Service: Obstetrics;  Laterality: Bilateral;     FAMILY HISTORY   Family History  Problem Relation Age of Onset   Deep vein thrombosis Father    Diabetes Father    Hypertension Mother    Hypertension Other    Diabetes Paternal Aunt    Heart disease Maternal Grandmother    Heart disease Maternal Grandfather    Diabetes Paternal Grandmother      SOCIAL HISTORY   Social History   Tobacco Use   Smoking status: Never   Smokeless tobacco: Never  Vaping Use   Vaping status: Never Used  Substance Use Topics   Alcohol use: Not Currently   Drug use: No     MEDICATIONS    Home Medication:  Current Outpatient Rx   Order #: [id number] Class: Historical Med   Order #: [id number] Class: Normal   Order #: [id number] Class: Historical Med   Order #: [id number] Class:  Historical Med   Order #: [id number] Class: Normal   Order #: [id number] Class: Normal   Order #: [id number] Class: Print   Order #: [id number] Class: Normal   Order #: [id number] Class: Historical Med   Order #: [id number] Class: Normal   Order #: [id number] Class: Normal   Order #: [id number] Class: Normal   Order #: [id number] Class: Historical Med   Order #: [id number] Class: Normal   Order #: [id number] Class: Normal   Order #: [id number] Class: Normal   Order #: [id number] Class: Normal   Order #: [id number] Class: Normal   Order  #: [id number] Class: Historical Med   Order #: [id number] Class: Historical Med   Order #: [id number] Class: Historical Med    Current Medication:  Current Outpatient Medications:    acetaminophen (TYLENOL) 500 MG tablet, Take 1,000 mg by mouth every 6 (six) hours as needed for mild pain., Disp: , Rfl:    albuterol (VENTOLIN HFA) 108 (90 Base) MCG/ACT inhaler, Inhale 1-2 puffs into the lungs every 6 (six) hours as needed for wheezing or shortness of breath., Disp: 18 g, Rfl: 0   cetirizine (ZYRTEC) 10 MG tablet, Take 10 mg by mouth daily as needed for allergies., Disp: , Rfl:    Cholecalciferol (VITAMIN D3) 125 MCG (5000 UT) CAPS, Take 1 capsule by mouth daily., Disp: , Rfl:    clonazePAM (KLONOPIN) 0.5 MG disintegrating tablet, Take 1-2 tablets (0.5-1 mg total) by mouth 2 (two) times daily as needed (panic attacks)., Disp: 60 tablet, Rfl: 1   Cyanocobalamin (VITAMIN B-12) 1000 MCG SUBL, Place 1 tablet (1,000 mcg total) under the tongue daily., Disp: 100 tablet, Rfl: 3   cyclobenzaprine (FLEXERIL) 10 MG tablet, Take 1 tablet (10 mg total) by mouth 2 (two) times daily as needed for muscle spasms., Disp: 20 tablet, Rfl: 0   doxycycline (VIBRAMYCIN) 100 MG capsule, Take 1 capsule (100 mg total) by mouth 2 (two) times daily., Disp: 20 capsule, Rfl: 0   furosemide (LASIX) 20 MG tablet, TAKE 1 TO 2 TABLETS BY MOUTH ONCE DAILY AS NEEDED, Disp: , Rfl:    ibuprofen (ADVIL) 600 MG tablet, Take 1 tablet (600 mg total) by mouth every 6 (six) hours as needed., Disp: 30 tablet, Rfl: 0   lidocaine (LIDODERM) 5 %, Place 1 patch onto the skin daily as needed. Remove & Discard patch within 12 hours or as directed by MD, Disp: 15 patch, Rfl: 0   meloxicam (MOBIC) 15 MG tablet, Take 1 tablet (15 mg total) by mouth daily., Disp: 30 tablet, Rfl: 0   Multiple Vitamin (MULTIVITAMIN) capsule, Take 1 capsule by mouth daily., Disp: , Rfl:    oxyCODONE (ROXICODONE) 5 MG immediate release tablet, Take 1 tablet (5 mg total) by  mouth every 6 (six) hours as needed., Disp: 10 tablet, Rfl: 0   pantoprazole (PROTONIX) 40 MG tablet, Take 1 tablet (40 mg total) by mouth daily., Disp: 90 tablet, Rfl: 01   potassium chloride SA (KLOR-CON M) 20 MEQ tablet, Take 1 tablet (20 mEq total) by mouth 2 (two) times daily. TAKE ONE TABLET TWO TIMES A DAY, Disp: 180 tablet, Rfl: 3   RIVAROXABAN (XARELTO) VTE STARTER PACK (15 & 20 MG), Follow package directions: Take one 15mg  tablet by mouth twice a day. On day 22, switch to one 20mg  tablet once a day. Take with food., Disp: 51 each, Rfl: 0   rizatriptan (MAXALT) 10 MG tablet, Take 1 tablet (10 mg total) by mouth daily as needed for migraine. May repeat in 2 hours if needed, Disp: 12 tablet, Rfl: 5  Tirzepatide-Weight Management (ZEPBOUND [place]), , Disp: , Rfl:    traMADol (ULTRAM) 50 MG tablet, Take 1 tablet by mouth every 6 (six) hours as needed., Disp: , Rfl:    triamterene-hydrochlorothiazide (MAXZIDE-25) 37.5-25 MG tablet, Take 1 tablet by mouth daily., Disp: , Rfl:     ALLERGIES   Meloxicam   BP 128/86 (BP Location: Left Arm, Patient Position: Sitting, Cuff Size: Normal)   Pulse 87   Temp 97.6 F (36.4 C) (Temporal)   Ht 5\' 4"  (1.626 m)   Wt 290 lb (131.5 kg)   LMP 04/25/2023   SpO2 100%   BMI 49.78 kg/m    REVIEW OF SYSTEMS    Review of Systems:  Gen:  Denies  fever, sweats, chills weigh loss  HEENT: Denies blurred vision, double vision, ear pain, eye pain, hearing loss, nose bleeds, sore throat Cardiac:  No dizziness, chest pain or heaviness, chest tightness,edema Resp:   Denies cough or sputum porduction, shortness of breath,wheezing, hemoptysis,  Gi: Denies swallowing difficulty, stomach pain, nausea or vomiting, diarrhea, constipation, bowel incontinence Gu:  Denies bladder incontinence, burning urine Ext:   Denies Joint pain, stiffness or swelling Skin: Denies  skin rash, easy bruising or bleeding or hives Endoc:  Denies polyuria, polydipsia , polyphagia or  weight change Psych:   Denies depression, insomnia or hallucinations   Other:  All other systems negative     PHYSICAL EXAM  General Appearance: No distress  EYES PERRLA, EOM intact.   NECK Supple, No JVD Pulmonary: normal breath sounds, No wheezing.  CardiovascularNormal S1,S2.  No m/r/g.   Abdomen: Benign, Soft, non-tender. Skin:   warm, no rashes, no ecchymosis  Extremities: normal, no cyanosis, clubbing. Neuro:without focal findings,  speech normal  PSYCHIATRIC: Mood, affect within normal limits.   ALL OTHER ROS ARE NEGATIVE      IMAGING    DG Chest 2 View Result Date: 05/16/2023 CLINICAL DATA:  Worsening cough. EXAM: CHEST - 2 VIEW COMPARISON:  05/05/2023 FINDINGS: Heart size and pulmonary vascularity are normal. Lungs are clear. No pleural effusion or pneumothorax. Mediastinal contours appear intact. Thoracolumbar scoliosis convex towards the right. Surgical clips in the left upper quadrant. IMPRESSION: No active cardiopulmonary disease. Electronically Signed   By: Burman Nieves M.D.   On: 05/16/2023 17:40   CT Angio Chest PE W and/or Wo Contrast Result Date: 05/05/2023 CLINICAL DATA:  Pulmonary embolism (PE) suspected, high prob Recent diagnosis of pulmonary embolus.  Does not feel wall. EXAM: CT ANGIOGRAPHY CHEST WITH CONTRAST TECHNIQUE: Multidetector CT imaging of the chest was performed using the standard protocol during bolus administration of intravenous contrast. Multiplanar CT image reconstructions and MIPs were obtained to evaluate the vascular anatomy. RADIATION DOSE REDUCTION: This exam was performed according to the departmental dose-optimization program which includes automated exposure control, adjustment of the mA and/or kV according to patient size and/or use of iterative reconstruction technique. CONTRAST:  OMNIPAQUE IOHEXOL 350 MG/ML SOLN COMPARISON:  Radiograph earlier today.  Chest CTA 04/29/2023 FINDINGS: Cardiovascular: Improvement in lower lobe  pulmonary emboli with mild residual in the subsegmental right lower lobe series 304, image 131. no new or progressive pulmonary embolus. No right heart strain. The heart is upper normal in size. No pericardial effusion. Normal caliber thoracic aorta without acute aortic findings. Mediastinum/Nodes: No adenopathy or mass unremarkable esophagus. No thyroid nodule. Lungs/Pleura: No focal airspace disease. No pulmonary infarct. No pleural effusion. The trachea and central airways are clear. Upper Abdomen: Postsurgical change in the stomach. No acute  findings. Musculoskeletal: Thoracic scoliosis and spondylosis. There are no acute or suspicious osseous abnormalities. Review of the MIP images confirms the above findings. IMPRESSION: 1. Persistent but improved subsegmental pulmonary emboli in the right lower lobe. 2. No new or progressive pulmonary embolus. 3. Clear lungs. Electronically Signed   By: Narda Rutherford M.D.   On: 05/05/2023 16:04   DG Chest 2 View Result Date: 05/05/2023 CLINICAL DATA:  Shortness of breath. EXAM: CHEST - 2 VIEW COMPARISON:  04/29/2023. FINDINGS: Apparent sharply marginated opacity overlying the right lower lateral hemithorax extends inferior to the ribcage and is secondary to skin fold/overlying objects. No discrete pneumothorax seen. Bilateral lung fields are clear. No dense consolidation or lung collapse. Bilateral costophrenic angles are clear. Normal cardio-mediastinal silhouette. No acute osseous abnormalities. The soft tissues are within normal limits. IMPRESSION: No active cardiopulmonary disease. Electronically Signed   By: Jules Schick M.D.   On: 05/05/2023 11:47   CT Angio Chest PE W and/or Wo Contrast Result Date: 04/29/2023 CLINICAL DATA:  Shortness of breath. EXAM: CT ANGIOGRAPHY CHEST WITH CONTRAST TECHNIQUE: Multidetector CT imaging of the chest was performed using the standard protocol during bolus administration of intravenous contrast. Multiplanar CT image  reconstructions and MIPs were obtained to evaluate the vascular anatomy. RADIATION DOSE REDUCTION: This exam was performed according to the departmental dose-optimization program which includes automated exposure control, adjustment of the mA and/or kV according to patient size and/or use of iterative reconstruction technique. CONTRAST:  75mL OMNIPAQUE IOHEXOL 350 MG/ML SOLN COMPARISON:  Aug 07, 2022 FINDINGS: Cardiovascular: Satisfactory opacification of the pulmonary arteries to the segmental level. A small amount of intraluminal low attenuation is seen involving multiple posterior lower lobe branches of the right pulmonary artery (axial CT images 56 through 60, CT series 302). Normal heart size without evidence of right heart strain (RV/LV ratio of 0.76). No pericardial effusion. Mediastinum/Nodes: No enlarged mediastinal, hilar, or axillary lymph nodes. Thyroid gland, trachea, and esophagus demonstrate no significant findings. Lungs/Pleura: Lungs are clear. No pleural effusion or pneumothorax. Upper Abdomen: Surgical sutures are noted within the gastric region. Musculoskeletal: There is moderate to marked severity dextroscoliosis of the lower thoracic and upper lumbar spine. Review of the MIP images confirms the above findings. IMPRESSION: 1. Small amount of pulmonary embolism involving multiple posterior lower lobe branches of the right pulmonary artery. Electronically Signed   By: Aram Candela M.D.   On: 04/29/2023 22:34   DG Chest 2 View Result Date: 04/29/2023 CLINICAL DATA:  Pleuritic left chest pain EXAM: CHEST - 2 VIEW COMPARISON:  11/29/2021 FINDINGS: Heart and mediastinal contours within normal limits. No confluent airspace opacities or effusions. No acute bony abnormality. Thoracic scoliosis again noted. IMPRESSION: No active cardiopulmonary disease. Electronically Signed   By: Charlett Nose M.D.   On: 04/29/2023 21:18      ASSESSMENT/PLAN    48 year old morbidly obese African-American  female seen today for acute on chronic pulmonary embolism with recent diagnosis of acute right lower lobe pulmonary embolism currently on anticoagulation therapy. At this time it is unknown why she has recurrent PE and DVT.  Acute PE CT scan shows improving pulmonary embolism Continue anticoagulation therapy   Shortness of breath or dyspnea exertion Will assess for exertional hypoxia and To assess for nocturnal hypoxia with overnight pulse oximetry   MEDICATION ADJUSTMENTS/LABS AND TESTS ORDERED: Continue anticoagulation therapy for pulmonary embolism We will check oxygen levels at nighttime with overnight pulse oximetry Follow-up with hematologist next week  Avoid Allergens and Irritants Avoid  secondhand smoke Avoid SICK contacts Recommend  Masking  when appropriate Recommend Keep up-to-date with vaccinations   CURRENT MEDICATIONS REVIEWED AT LENGTH WITH PATIENT TODAY   Patient  satisfied with Plan of action and management. All questions answered  Follow up 3 months yeah vitals vitals  I spent a total of 65 minutes reviewing chart data, face-to-face evaluation with the patient, counseling and coordination of care as detailed above.     Lucie Leather, M.D.  [facility]  Medical Director [facility] Medical Director [facility] Cardio-Pulmonary Department

## 2023-05-22 NOTE — Telephone Encounter (Signed)
 Patient advised that Xarelto has been sent to pharmacy with refills. Patient advised to continue Xarelto daily for now until her fu appt in June. Nothing further needed.

## 2023-05-28 ENCOUNTER — Emergency Department (HOSPITAL_BASED_OUTPATIENT_CLINIC_OR_DEPARTMENT_OTHER)
Admission: EM | Admit: 2023-05-28 | Discharge: 2023-05-28 | Disposition: A | Discharge status: Home / Self Care | Attending: Emergency Medicine | Admitting: Emergency Medicine

## 2023-05-28 ENCOUNTER — Other Ambulatory Visit: Payer: Self-pay

## 2023-05-28 ENCOUNTER — Emergency Department (HOSPITAL_BASED_OUTPATIENT_CLINIC_OR_DEPARTMENT_OTHER)

## 2023-05-28 ENCOUNTER — Encounter (HOSPITAL_BASED_OUTPATIENT_CLINIC_OR_DEPARTMENT_OTHER): Payer: Self-pay | Admitting: Emergency Medicine

## 2023-05-28 DIAGNOSIS — M546 Pain in thoracic spine: Secondary | ICD-10-CM | POA: Diagnosis not present

## 2023-05-28 DIAGNOSIS — R079 Chest pain, unspecified: Secondary | ICD-10-CM | POA: Insufficient documentation

## 2023-05-28 DIAGNOSIS — Z7901 Long term (current) use of anticoagulants: Secondary | ICD-10-CM | POA: Diagnosis not present

## 2023-05-28 LAB — CBC WITH DIFFERENTIAL/PLATELET
Abs Immature Granulocytes: 0.01 10*3/uL (ref 0.00–0.07)
Basophils Absolute: 0 10*3/uL (ref 0.0–0.1)
Basophils Relative: 1 %
Eosinophils Absolute: 0.2 10*3/uL (ref 0.0–0.5)
Eosinophils Relative: 3 %
HCT: 38.6 % (ref 36.0–46.0)
Hemoglobin: 13 g/dL (ref 12.0–15.0)
Immature Granulocytes: 0 %
Lymphocytes Relative: 26 %
Lymphs Abs: 1.8 10*3/uL (ref 0.7–4.0)
MCH: 32.3 pg (ref 26.0–34.0)
MCHC: 33.7 g/dL (ref 30.0–36.0)
MCV: 96 fL (ref 80.0–100.0)
Monocytes Absolute: 0.6 10*3/uL (ref 0.1–1.0)
Monocytes Relative: 8 %
Neutro Abs: 4.4 10*3/uL (ref 1.7–7.7)
Neutrophils Relative %: 62 %
Platelets: 351 10*3/uL (ref 150–400)
RBC: 4.02 MIL/uL (ref 3.87–5.11)
RDW: 12.5 % (ref 11.5–15.5)
WBC: 7.1 10*3/uL (ref 4.0–10.5)
nRBC: 0 % (ref 0.0–0.2)

## 2023-05-28 LAB — COMPREHENSIVE METABOLIC PANEL
ALT: 15 U/L (ref 0–44)
AST: 26 U/L (ref 15–41)
Albumin: 3.6 g/dL (ref 3.5–5.0)
Alkaline Phosphatase: 40 U/L (ref 38–126)
Anion gap: 7 (ref 5–15)
BUN: 11 mg/dL (ref 6–20)
CO2: 28 mmol/L (ref 22–32)
Calcium: 9 mg/dL (ref 8.9–10.3)
Chloride: 101 mmol/L (ref 98–111)
Creatinine, Ser: 0.68 mg/dL (ref 0.44–1.00)
GFR, Estimated: >60 mL/min (ref >60)
Glucose, Bld: 116 mg/dL — ABNORMAL HIGH (ref 70–99)
Potassium: 4.5 mmol/L (ref 3.5–5.1)
Sodium: 136 mmol/L (ref 135–145)
Total Bilirubin: 1.1 mg/dL (ref 0.0–1.2)
Total Protein: 6.9 g/dL (ref 6.5–8.1)

## 2023-05-28 LAB — PREGNANCY, URINE: Preg Test, Ur: NEGATIVE

## 2023-05-28 LAB — TROPONIN I (HIGH SENSITIVITY)
Troponin I (High Sensitivity): 2 ng/L (ref <18)
Troponin I (High Sensitivity): 2 ng/L (ref <18)

## 2023-05-28 MED ORDER — IOHEXOL 350 MG/ML SOLN
100.0000 mL | Freq: Once | INTRAVENOUS | Status: AC | PRN
  Administered 2023-05-28: 100 mL via INTRAVENOUS

## 2023-05-28 MED ORDER — ACETAMINOPHEN 500 MG PO TABS
1000.0000 mg | ORAL_TABLET | Freq: Once | ORAL | Status: AC
  Administered 2023-05-28: 1000 mg via ORAL
  Filled 2023-05-28: qty 2

## 2023-05-28 NOTE — Discharge Instructions (Signed)
 Thank you for coming to [facility] Emergency Department. You were seen for chest pain. We did an exam, labs, and imaging, and these showed no acute findings.  Please follow up with your primary care provider within 1 week.   Do not hesitate to return to the ED or call 911 if you experience: -Worsening symptoms -Lightheadedness, passing out -Fevers/chills -Anything else that concerns you

## 2023-05-28 NOTE — ED Triage Notes (Signed)
 Pt with mid and LT chest pain and upper back pain that has worsened over the last week; dx with PE about 1 month ago; taking Xarelto as directed

## 2023-05-28 NOTE — ED Notes (Addendum)
 pt. seemed irritated that i had to collect more blood, and asked why didn't we get all the blood that was needed the first time. I made her aware that sometimes things are added on after the initaial blood draw and that we would have had to collect a trop as well since she was here for Chest pain. Pt. mentioned to another nurse that she would be filling a complaint.

## 2023-05-28 NOTE — ED Provider Notes (Signed)
 [place] [facility] AT [facility] Provider Note   CSN: [id number] Arrival date & time: 05/28/23  1805     History {Add pertinent medical, surgical, social history, OB history to HPI:1} Chief Complaint  Patient presents with   Chest Pain    TISH BEGIN is a 48 y.o. female with PMH as listed below who presents with mid and LT chest pain and upper back pain that has worsened over the last week; dx with PE about 1 month ago and states she had similar pain when she got diagnosed. She improved over the last month but the pain returned and has worsened x1 week. Rated 7/10 right now. No associated nausea/vomiting, cough, hemoptysis, f/c; taking Xarelto as directed. No leg swelling.   Past Medical History:  Diagnosis Date   Constipation    Edema    Headache(784.0)    Morbid obesity (HCC)    Pulmonary embolism (HCC)        Home Medications Prior to Admission medications   Medication Sig Start Date End Date Taking? Authorizing Provider  acetaminophen (TYLENOL) 500 MG tablet Take 1,000 mg by mouth every 6 (six) hours as needed for mild pain.    [provider]  albuterol (VENTOLIN HFA) 108 (90 Base) MCG/ACT inhaler Inhale 1-2 puffs into the lungs every 6 (six) hours as needed for wheezing or shortness of breath. 06/21/20   Olive Bass, FNP  cetirizine (ZYRTEC) 10 MG tablet Take 10 mg by mouth daily as needed for allergies.    [provider]  Cholecalciferol (VITAMIN D3) 125 MCG (5000 UT) CAPS Take 1 capsule by mouth daily.    [provider]  clonazePAM (KLONOPIN) 0.5 MG disintegrating tablet Take 1-2 tablets (0.5-1 mg total) by mouth 2 (two) times daily as needed (panic attacks). 09/29/19   Plotnikov, Georgina Quint, MD  Cyanocobalamin (VITAMIN B-12) 1000 MCG SUBL Place 1 tablet (1,000 mcg total) under the tongue daily. 08/04/19   Plotnikov, Georgina Quint, MD  cyclobenzaprine (FLEXERIL) 10 MG tablet Take 1 tablet (10 mg total) by mouth  2 (two) times daily as needed for muscle spasms. 11/30/21   Tegeler, Canary Brim, MD  doxycycline (VIBRAMYCIN) 100 MG capsule Take 1 capsule (100 mg total) by mouth 2 (two) times daily. 05/16/23   Raspet, Noberto Retort, PA-C  furosemide (LASIX) 20 MG tablet TAKE 1 TO 2 TABLETS BY MOUTH ONCE DAILY AS NEEDED    [provider]  ibuprofen (ADVIL) 600 MG tablet Take 1 tablet (600 mg total) by mouth every 6 (six) hours as needed. 01/19/23   Fayrene Helper, PA-C  lidocaine (LIDODERM) 5 % Place 1 patch onto the skin daily as needed. Remove & Discard patch within 12 hours or as directed by MD 05/05/23   Leighton Leiter, DO  meloxicam (MOBIC) 15 MG tablet Take 1 tablet (15 mg total) by mouth daily. 02/19/23   Plotnikov, Georgina Quint, MD  Multiple Vitamin (MULTIVITAMIN) capsule Take 1 capsule by mouth daily.    [provider]  oxyCODONE (ROXICODONE) 5 MG immediate release tablet Take 1 tablet (5 mg total) by mouth every 6 (six) hours as needed. 05/05/23   Clipper Leiter, DO  pantoprazole (PROTONIX) 40 MG tablet Take 1 tablet (40 mg total) by mouth daily. 11/06/20   Plotnikov, Georgina Quint, MD  potassium chloride SA (KLOR-CON M) 20 MEQ tablet Take 1 tablet (20 mEq total) by mouth 2 (two) times daily. TAKE ONE TABLET TWO TIMES A DAY 10/10/22  Plotnikov, Georgina Quint, MD  Rivaroxaban (XARELTO) 15 MG TABS tablet Take 1 tablet (15 mg total) by mouth 2 (two) times daily with a meal. 05/22/23   Erin Fulling, MD  RIVAROXABAN Carlena Hurl) VTE STARTER PACK (15 & 20 MG) Follow package directions: Take one 15mg  tablet by mouth twice a day. On day 22, switch to one 20mg  tablet once a day. Take with food. 04/30/23   Terald Sleeper, MD  rizatriptan (MAXALT) 10 MG tablet Take 1 tablet (10 mg total) by mouth daily as needed for migraine. May repeat in 2 hours if needed 02/19/23   Plotnikov, Georgina Quint, MD  Tirzepatide-Weight Management [facility])     [provider]  traMADol (ULTRAM) 50 MG tablet Take 1 tablet by mouth every 6  (six) hours as needed.    [provider]  triamterene-hydrochlorothiazide (MAXZIDE-25) 37.5-25 MG tablet Take 1 tablet by mouth daily. 01/08/23   [provider]      Allergies    Meloxicam    Review of Systems   Review of Systems A 10 point review of systems was performed and is negative unless otherwise reported in HPI.  Physical Exam Updated Vital Signs BP 136/79 (BP Location: Right Arm)   Pulse 92   Temp 99 F (37.2 C)   Resp 18   Ht 5\' 4"  (1.626 m)   Wt 131.5 kg   LMP 04/25/2023   SpO2 98%   BMI 49.76 kg/m  Physical Exam General: Normal appearing obese female, lying in bed.  HEENT: Sclera anicteric, MMM, trachea midline.  Cardiology: RRR, no murmurs/rubs/gallops. No chest wall TTP. Resp: Normal respiratory rate and effort. CTAB, no wheezes, rhonchi, crackles.  Abd: Soft, non-tender, non-distended. No rebound tenderness or guarding.  GU: Deferred. MSK: No peripheral edema or signs of trauma. Extremities without deformity or TTP. No cyanosis or clubbing. Skin: warm, dry.  Neuro: A&Ox4, CNs II-XII grossly intact. MAEs. Sensation grossly intact.  Psych: Normal mood and affect.   ED Results / Procedures / Treatments   Labs (all labs ordered are listed, but only abnormal results are displayed) Labs Reviewed  CBC WITH DIFFERENTIAL/PLATELET  COMPREHENSIVE METABOLIC PANEL  HCG, SERUM, QUALITATIVE  TROPONIN I (HIGH SENSITIVITY)    EKG None  Radiology No results found.  Procedures Procedures  {Document cardiac monitor, telemetry assessment procedure when appropriate:1}  Medications Ordered in ED Medications - No data to display  ED Course/ Medical Decision Making/ A&P                          Medical Decision Making Amount and/or Complexity of Data Reviewed Labs: ordered. Radiology: ordered.    This patient presents to the ED for concern of ***, this involves an extensive number of treatment options, and is a complaint that carries  with it a high risk of complications and morbidity.  I considered the following differential and admission for this acute, potentially life threatening condition.   MDM:    ***  I discussed with the patient that this chest pain and SOB can be normal as you recover from PEs. Patient is very concerned about potential ineffectiveness of the xarelto and the recurrent worsening of her symptoms. With improvement and subsequent worsening of the pain I do not think it is unreasonable to perform an additional CT scan.  Clinical Course as of 05/28/23 1937  Wed May 28, 2023  1936 CBC with Differential wnl [HN]    Clinical Course User  Index [HN] Loetta Rough, MD    Labs: I Ordered, and personally interpreted labs.  The pertinent results include:  those listed above  Imaging Studies ordered: I ordered imaging studies including CT PE I independently visualized and interpreted imaging. I agree with the radiologist interpretation  Additional history obtained from chart review  Cardiac Monitoring: The patient was maintained on a cardiac monitor.  I personally viewed and interpreted the cardiac monitored which showed an underlying rhythm of: NSR  Reevaluation: After the interventions noted above, I reevaluated the patient and found that they have :{resolved/improved/worsened:23923::"improved"}  Social Determinants of Health: Lives independently  Disposition:  ***  Co morbidities that complicate the patient evaluation  Past Medical History:  Diagnosis Date   Constipation    Edema    Headache(784.0)    Morbid obesity (HCC)    Pulmonary embolism (HCC)      Medicines No orders of the defined types were placed in this encounter.   I have reviewed the patients home medicines and have made adjustments as needed  Problem List / ED Course: Problem List Items Addressed This Visit   None        {Document critical care time when appropriate:1} {Document review of labs and  clinical decision tools ie heart score, Chads2Vasc2 etc:1}  {Document your independent review of radiology images, and any outside records:1} {Document your discussion with family members, caretakers, and with consultants:1} {Document social determinants of health affecting pt's care:1} {Document your decision making why or why not admission, treatments were needed:1}  This note was created using dictation software, which may contain spelling or grammatical errors.

## 2023-05-28 NOTE — ED Notes (Signed)
 Pt did not want RN to draw anymore blood urine obtained for preg test

## 2023-06-02 ENCOUNTER — Inpatient Hospital Stay

## 2023-06-02 VITALS — BP 129/65 | HR 76 | Temp 97.6°F | Resp 16 | Wt 292.7 lb

## 2023-06-02 DIAGNOSIS — I2699 Other pulmonary embolism without acute cor pulmonale: Secondary | ICD-10-CM | POA: Insufficient documentation

## 2023-06-02 DIAGNOSIS — Z903 Acquired absence of stomach [part of]: Secondary | ICD-10-CM | POA: Diagnosis not present

## 2023-06-02 DIAGNOSIS — Z7901 Long term (current) use of anticoagulants: Secondary | ICD-10-CM | POA: Insufficient documentation

## 2023-06-02 DIAGNOSIS — Z79899 Other long term (current) drug therapy: Secondary | ICD-10-CM | POA: Insufficient documentation

## 2023-06-02 DIAGNOSIS — Z832 Family history of diseases of the blood and blood-forming organs and certain disorders involving the immune mechanism: Secondary | ICD-10-CM | POA: Insufficient documentation

## 2023-06-02 DIAGNOSIS — E538 Deficiency of other specified B group vitamins: Secondary | ICD-10-CM | POA: Diagnosis not present

## 2023-06-02 DIAGNOSIS — Z9884 Bariatric surgery status: Secondary | ICD-10-CM | POA: Insufficient documentation

## 2023-06-02 LAB — CBC WITH DIFFERENTIAL (CANCER CENTER ONLY)
Abs Immature Granulocytes: 0.01 10*3/uL (ref 0.00–0.07)
Basophils Absolute: 0.1 10*3/uL (ref 0.0–0.1)
Basophils Relative: 1 %
Eosinophils Absolute: 0.2 10*3/uL (ref 0.0–0.5)
Eosinophils Relative: 3 %
HCT: 39.6 % (ref 36.0–46.0)
Hemoglobin: 13.2 g/dL (ref 12.0–15.0)
Immature Granulocytes: 0 %
Lymphocytes Relative: 28 %
Lymphs Abs: 2 10*3/uL (ref 0.7–4.0)
MCH: 32.6 pg (ref 26.0–34.0)
MCHC: 33.3 g/dL (ref 30.0–36.0)
MCV: 97.8 fL (ref 80.0–100.0)
Monocytes Absolute: 0.6 10*3/uL (ref 0.1–1.0)
Monocytes Relative: 8 %
Neutro Abs: 4.4 10*3/uL (ref 1.7–7.7)
Neutrophils Relative %: 60 %
Platelet Count: 317 10*3/uL (ref 150–400)
RBC: 4.05 MIL/uL (ref 3.87–5.11)
RDW: 12.2 % (ref 11.5–15.5)
WBC Count: 7.3 10*3/uL (ref 4.0–10.5)
nRBC: 0 % (ref 0.0–0.2)

## 2023-06-02 MED ORDER — APIXABAN 5 MG PO TABS: 5.0000 mg | ORAL_TABLET | Freq: Two times a day (BID) | ORAL | 0 refills | Status: DC

## 2023-06-02 NOTE — Assessment & Plan Note (Signed)
 Changing to apixaban 5 mg twice daily and discontinue rivaroxaban when start apixaban  Thrombophilia testing ordered.  Return in 2-3 weeks to discuss results

## 2023-06-02 NOTE — Assessment & Plan Note (Signed)
 Check ferritin and b12

## 2023-06-02 NOTE — Progress Notes (Signed)
 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   First episode: 2020 presumed PE due to an elevated D-dimer test, and was briefly on Xarelto, subsequently had a CT PE study did not show any obvious clot and was taken off of Xarelto.  Second episode: Feb 2025 resolved on xarelto Risk factors: personal history. Family history. BMI >30. 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 This last episode of blood clot appeared to be unprovoked and this was the second time.  She has similar symptoms resolved with anticoagulation during the first time.  She reports not tolerating Xarelto very well with unclear cause of back pain and fatigue.  No increased bleeding.  We discussed about options of anticoagulation therapies including alternative with Eliquis.  Having gastric sleeve surgery, Xarelto is mostly absorbed through the stomach and may be an issue.  Eliquis is unlikely going to be an issue as mostly absorbed through the distal intestine.  She is still within the first 3 months of acute event and recommend proceed with complete anticoagulation for at least 3 months.  After discussion, she would like to try apixaban.  Discussed dosing schedule.  Given her history of unprovoked event, long-term anticoagulation will decrease her risk of recurrence.  We discussed risks and benefits.  For patients with unprovoked VTE, the risks of recurrent VTE after completing a course of anticoagulant therapy have been estimated to be 10% by 2  years and >30% by 10 years. ASH 2020.   She would also like to obtain thrombophilia testing. We discussed about the pros and cons about testing for thrombophilia disorder.   She also has history of low B12, History of surgery.  Reports of fatigue.  Will check ferritin and B12.  History of sleeve gastrectomy Check ferritin and b12  Recurrent pulmonary emboli (HCC) Changing to apixaban 5 mg twice daily and discontinue rivaroxaban when start apixaban  Thrombophilia testing ordered.  Return in 2-3 weeks to discuss results   Patient education for risk factors and prevention of clotting We talked about modifiable risk factors.  Prevention of clotting like deep vein thrombosis including: Strong risk factors including fractures of lower limb, hospitalization for severe illness, such as heart failure, myocardial infarction, spinal cord injury, major trauma, hip or knee replacement, and previous VTE. avoid prolonged immobilization and moving extremities every 1-2 hours during long car rides or flights.  Taking a break and moving extremities if working in a job setting with prolonged sitting.   Avoid dehydration, especially in high altitude. Avoid cigarette smoking Avoid use of hormone replacement therapy, estrogen-containing contraceptive in women.   Maintaining healthy lifestyle to prevent development of diabetes.  Weight loss if BMI over 30.  Regular exercises but not extreme.   Other risk factors for clotting are surgery, hospitalization, inflammatory disease or severe infection, trauma or injuries from inflammatory state and stasis. If developing one-sided leg swelling, pain, color change, chest pain, sudden short of breath, difficulty taking deep breaths, taking deep breath with chest discomfort or pain, dizziness or heart racing sensation, go to the emergency room  immediately for evaluation. If developing trauma, uncontrolled bleeding, such as bloody stools report ED immediately. Avoid  NSAIDs, aspirin while on blood thinner.  Patient should avoid elective surgery in the acute thrombosis period for 3 months.  Anticoagulants do not cause bleeding.  Rather, if bleeding occurs when one is on anticoagulation, it may take longer for the bleeding to stop due to reduce coagulation capacity.  Orders Placed This Encounter  Procedures   CBC with Differential (Cancer Center Only)    Standing Status:   Future    Number of Occurrences:   1    Expiration Date:   06/01/2024   Cardiolipin antibodies, IgM+IgG    Standing Status:   Future    Number of Occurrences:   1    Expiration Date:   06/01/2024   Beta-2-glycoprotein i abs, IgG/M/A    Standing Status:   Future    Number of Occurrences:   1    Expiration Date:   06/01/2024   Lupus anticoagulant panel    Standing Status:   Future    Number of Occurrences:   1    Expiration Date:   06/01/2024   Protein C activity    Standing Status:   Future    Number of Occurrences:   1    Expiration Date:   06/01/2024   PROTEIN S PANEL    Standing Status:   Future    Number of Occurrences:   1    Expiration Date:   06/01/2024   Prothrombin gene mutation    Standing Status:   Future    Number of Occurrences:   1    Expiration Date:   06/01/2024   Antithrombin panel    Standing Status:   Future    Number of Occurrences:   1    Expiration Date:   07/03/2023   Factor 5 leiden    Standing Status:   Future    Number of Occurrences:   1    Expiration Date:   06/01/2024    All questions were answered. The patient knows to call the clinic with any problems, questions or concerns. The total time spent in the appointment was 60 minutes encounter with patients including review of chart and various tests results, discussions about plan of care and coordination of care plan  Melven Sartorius, MD 3/17/20253:59 PM  CHIEF COMPLAINTS/PURPOSE OF CONSULTATION:  Recurrent PE  HISTORY OF PRESENTING ILLNESS:  Bridget [name] 48 y.o. female is here because  of history of PE.  2011 [place] LLE: No evidence of deep venous thrombosis in the left lower extremity.   06/30/18 d-dimer 2.26 due to COVID hospitals had difficulty getting her in quickly. She started Xarelto. She started for a few days. Report pain in the chest, pressure and couldn't take a deep breath.  07/01/18 CTA: No evidence of pulmonary emboli or other acute abnormality in the chest.  She reported being on it for about 6 months. Report of heavier cycles.  Report in April of 2020 she had a PE and got a CT scan was negative for PE. Her symptoms of SOB came on quickly and she had palpitations as well. Pt reports she was told that the blood clot could have been missed due to being on Xarelto. At the time her D-dimer was high and she was having SOB so her PCP placed her on Xarelto for 6 months   08/19/19 saw Dr. Candise Che at [facility].   10/2020 CTA: No evidence of  pulmonary embolism or acute cardiopulmonary disease. 2. Evidence of prior gastric surgery.  10/04/21 presented with intermittent chest pain for the last 2 weeks. CTA: No CT findings for pulmonary embolism.  11/30/21 presented with shortness of breath, chest tightness, discomfort in her left lateral chest and back, CTA: No PE  07/2023 CTA: No evidence of pulmonary embolism or other acute intrathoracic process.  04/29/23 report new short of breath first thought was heat. She then started feeling hart to breath and pain on deep breaths from center to back. CTA: Small amount of pulmonary embolism involving multiple posterior lower lobe branches of the right pulmonary artery.  She was started on Xarelto.  No long drive, flight, trauma, surgery, injury, HRT or estrogen, smoking cigarette.   05/05/23 presented to the ED with complaint of chest pain. CTA: Persistent but improved subsegmental pulmonary emboli in the right lower lobe. 2. No new or progressive pulmonary embolus.  3. Clear lungs.  05/28/23 presented to ED with mid and LT chest pain and  upper back pain that has worsened over the last week  CTA: Previous right lower lobe pulmonary emboli have resolved. No new, persistent or recurrent pulmonary embolus.  She had gastric sleeve in 2021.   Father had long drive to [facility] and had DVT. He has another one but not clear the setting.  First cousin on paternal side died of PE.  No bloody or dark stool. No bloody urine or vaginal bleeding. She has regular periods.  MEDICAL HISTORY:  Past Medical History:  Diagnosis Date   Constipation    Edema    Headache(784.0)    Morbid obesity (HCC)    Pulmonary embolism (HCC)     SURGICAL HISTORY: Past Surgical History:  Procedure Laterality Date   BREAST REDUCTION SURGERY     BREAST SURGERY  2005   reduction   CESAREAN SECTION N/A 03/20/2016   Procedure: CESAREAN SECTION;  Surgeon: Gerald Leitz, MD;  Location: [facility]IR[facility]ING SUIT[facility];  Service: Obstetrics;  Laterality: N/A;   COLONOSCOPY WITH PROPOFOL N/A 05/11/2020   Procedure: COLONOSCOPY WITH PROPOFOL;  Surgeon: Rachael Fee, MD;  Location: [facility] ENDOSCOPY;  Service: Endoscopy;  Laterality: N/A;   POLYPECTOMY  05/11/2020   Procedure: POLYPECTOMY;  Surgeon: Rachael Fee, MD;  Location: [facility];  Service: Endoscopy;;   TUBAL LIGATION Bilateral 03/20/2016   Procedure: BILATERAL TUBAL LIGATION;  Surgeon: Gerald Leitz, MD;  Location: [facility] BIRTHING SUITES;  Service: Obstetrics;  Laterality: Bilateral;    SOCIAL HISTORY: Social History   Socioeconomic History   Marital status: Married    Spouse name: Not on file   Number of children: 1   Years of education: Not on file   Highest education level: Not on file  Occupational History    Employer: [employer]  Tobacco Use   Smoking status: Never   Smokeless tobacco: Never  Vaping Use   Vaping status: Never Used  Substance and Sexual Activity   Alcohol use: Not Currently   Drug use: No   Sexual activity: Yes    Birth control/protection: None  Other Topics Concern   Not on file   Social History Narrative   Not on file   Social Drivers of Health   Financial Resource Strain: Not on file  Food Insecurity: No Food Insecurity (03/02/2020)   Received from [facility] visits prior to 05/18/2022., [facility] visits prior to 05/18/2022.   Hunger Vital Sign    Worried About Running Out  of Food in the Last Year: Never true    Ran Out of Food in the Last Year: Never true  Transportation Needs: Not on file  Physical Activity: Not on file  Stress: Not on file  Social Connections: Not on file  Intimate Partner Violence: Not on file    FAMILY HISTORY: Family History  Problem Relation Age of Onset   Deep vein thrombosis Father    Diabetes Father    Hypertension Mother    Hypertension Other    Diabetes Paternal Aunt    Heart disease Maternal Grandmother    Heart disease Maternal Grandfather    Diabetes Paternal Grandmother     ALLERGIES:  is allergic to meloxicam.  MEDICATIONS:  Current Outpatient Medications  Medication Sig Dispense Refill   apixaban (ELIQUIS) 5 MG TABS tablet Take 1 tablet (5 mg total) by mouth 2 (two) times daily. 120 tablet 0   acetaminophen (TYLENOL) 500 MG tablet Take 1,000 mg by mouth every 6 (six) hours as needed for mild pain.     albuterol (VENTOLIN HFA) 108 (90 Base) MCG/ACT inhaler Inhale 1-2 puffs into the lungs every 6 (six) hours as needed for wheezing or shortness of breath. 18 g 0   cetirizine (ZYRTEC) 10 MG tablet Take 10 mg by mouth daily as needed for allergies.     Cholecalciferol (VITAMIN D3) 125 MCG (5000 UT) CAPS Take 1 capsule by mouth daily.     clonazePAM (KLONOPIN) 0.5 MG disintegrating tablet Take 1-2 tablets (0.5-1 mg total) by mouth 2 (two) times daily as needed (panic attacks). 60 tablet 1   Cyanocobalamin (VITAMIN B-12) 1000 MCG SUBL Place 1 tablet (1,000 mcg total) under the tongue daily. 100 tablet 3   cyclobenzaprine (FLEXERIL) 10 MG tablet Take 1 tablet (10 mg total) by  mouth 2 (two) times daily as needed for muscle spasms. 20 tablet 0   furosemide (LASIX) 20 MG tablet TAKE 1 TO 2 TABLETS BY MOUTH ONCE DAILY AS NEEDED (Patient not taking: Reported on 06/02/2023)     lidocaine (LIDODERM) 5 % Place 1 patch onto the skin daily as needed. Remove & Discard patch within 12 hours or as directed by MD 15 patch 0   meloxicam (MOBIC) 15 MG tablet Take 1 tablet (15 mg total) by mouth daily. (Patient not taking: Reported on 06/02/2023) 30 tablet 0   Multiple Vitamin (MULTIVITAMIN) capsule Take 1 capsule by mouth daily.     oxyCODONE (ROXICODONE) 5 MG immediate release tablet Take 1 tablet (5 mg total) by mouth every 6 (six) hours as needed. 10 tablet 0   pantoprazole (PROTONIX) 40 MG tablet Take 1 tablet (40 mg total) by mouth daily. 90 tablet 01   potassium chloride SA (KLOR-CON M) 20 MEQ tablet Take 1 tablet (20 mEq total) by mouth 2 (two) times daily. TAKE ONE TABLET TWO TIMES A DAY 180 tablet 3   Rivaroxaban (XARELTO) 15 MG TABS tablet Take 1 tablet (15 mg total) by mouth 2 (two) times daily with a meal. 42 tablet 10   rizatriptan (MAXALT) 10 MG tablet Take 1 tablet (10 mg total) by mouth daily as needed for migraine. May repeat in 2 hours if needed 12 tablet 5   Tirzepatide-Weight Management (ZEPBOUND [place])      traMADol (ULTRAM) 50 MG tablet Take 1 tablet by mouth every 6 (six) hours as needed.     triamterene-hydrochlorothiazide (MAXZIDE-25) 37.5-25 MG tablet Take 1 tablet by mouth daily.     No current facility-administered medications  for this visit.    REVIEW OF SYSTEMS:   All relevant systems were reviewed with the patient and are negative.  PHYSICAL EXAMINATION:  Vitals:   06/02/23 1452  BP: 129/65  Pulse: 76  Resp: 16  Temp: 97.6 F (36.4 C)  SpO2: 100%   Filed Weights   06/02/23 1452  Weight: 292 lb 11.2 oz (132.8 kg)    GENERAL: alert, no distress and comfortable. overweight LUNGS: Effort normal and no respiratory distress.  O2 sat  100% Musculoskeletal:  bilateral lower extremity no significant edema  LABORATORY DATA:  I have reviewed the data as listed   RADIOGRAPHIC STUDIES: I have personally reviewed the radiological images as listed and agreed with the findings in the report. CT Angio Chest PE W and/or Wo Contrast Result Date: 05/28/2023 CLINICAL DATA:  Pulmonary embolism (PE) suspected, high prob Pain. EXAM: CT ANGIOGRAPHY CHEST WITH CONTRAST TECHNIQUE: Multidetector CT imaging of the chest was performed using the standard protocol during bolus administration of intravenous contrast. Multiplanar CT image reconstructions and MIPs were obtained to evaluate the vascular anatomy. RADIATION DOSE REDUCTION: This exam was performed according to the departmental dose-optimization program which includes automated exposure control, adjustment of the mA and/or kV according to patient size and/or use of iterative reconstruction technique. CONTRAST:  OMNIPAQUE IOHEXOL 350 MG/ML SOLN COMPARISON:  Radiograph 05/16/2023. Chest CTA 05/05/2023, 04/29/2023. FINDINGS: Cardiovascular: Previous right lower lobe pulmonary emboli have resolved. No new, persistent or recurrent pulmonary embolus. The heart is normal in size. No pericardial effusion. Normal thoracic aorta. Mediastinum/Nodes: No mediastinal or hilar adenopathy. Unremarkable esophagus. No thyroid nodule. Lungs/Pleura: Clear lungs. No focal airspace disease or pleural effusion. No features of pulmonary edema. No pneumothorax. Trachea and central airways are clear. Upper Abdomen: Postsurgical change in the stomach. No acute findings. Musculoskeletal: Scoliosis and degenerative change in the spine. There are no acute or suspicious osseous abnormalities. Review of the MIP images confirms the above findings. IMPRESSION: 1. Previous right lower lobe pulmonary emboli have resolved. No new, persistent or recurrent pulmonary embolus. 2. No acute intrathoracic abnormality. Electronically Signed    By: Narda Rutherford M.D.   On: 05/28/2023 22:18   DG Chest 2 View Result Date: 05/16/2023 CLINICAL DATA:  Worsening cough. EXAM: CHEST - 2 VIEW COMPARISON:  05/05/2023 FINDINGS: Heart size and pulmonary vascularity are normal. Lungs are clear. No pleural effusion or pneumothorax. Mediastinal contours appear intact. Thoracolumbar scoliosis convex towards the right. Surgical clips in the left upper quadrant. IMPRESSION: No active cardiopulmonary disease. Electronically Signed   By: Burman Nieves M.D.   On: 05/16/2023 17:40   CT Angio Chest PE W and/or Wo Contrast Result Date: 05/05/2023 CLINICAL DATA:  Pulmonary embolism (PE) suspected, high prob Recent diagnosis of pulmonary embolus.  Does not feel wall. EXAM: CT ANGIOGRAPHY CHEST WITH CONTRAST TECHNIQUE: Multidetector CT imaging of the chest was performed using the standard protocol during bolus administration of intravenous contrast. Multiplanar CT image reconstructions and MIPs were obtained to evaluate the vascular anatomy. RADIATION DOSE REDUCTION: This exam was performed according to the departmental dose-optimization program which includes automated exposure control, adjustment of the mA and/or kV according to patient size and/or use of iterative reconstruction technique. CONTRAST:  OMNIPAQUE IOHEXOL 350 MG/ML SOLN COMPARISON:  Radiograph earlier today.  Chest CTA 04/29/2023 FINDINGS: Cardiovascular: Improvement in lower lobe pulmonary emboli with mild residual in the subsegmental right lower lobe series 304, image 131. no new or progressive pulmonary embolus. No right heart strain. The heart is  upper normal in size. No pericardial effusion. Normal caliber thoracic aorta without acute aortic findings. Mediastinum/Nodes: No adenopathy or mass unremarkable esophagus. No thyroid nodule. Lungs/Pleura: No focal airspace disease. No pulmonary infarct. No pleural effusion. The trachea and central airways are clear. Upper Abdomen: Postsurgical change  in the stomach. No acute findings. Musculoskeletal: Thoracic scoliosis and spondylosis. There are no acute or suspicious osseous abnormalities. Review of the MIP images confirms the above findings. IMPRESSION: 1. Persistent but improved subsegmental pulmonary emboli in the right lower lobe. 2. No new or progressive pulmonary embolus. 3. Clear lungs. Electronically Signed   By: Narda Rutherford M.D.   On: 05/05/2023 16:04   DG Chest 2 View Result Date: 05/05/2023 CLINICAL DATA:  Shortness of breath. EXAM: CHEST - 2 VIEW COMPARISON:  04/29/2023. FINDINGS: Apparent sharply marginated opacity overlying the right lower lateral hemithorax extends inferior to the ribcage and is secondary to skin fold/overlying objects. No discrete pneumothorax seen. Bilateral lung fields are clear. No dense consolidation or lung collapse. Bilateral costophrenic angles are clear. Normal cardio-mediastinal silhouette. No acute osseous abnormalities. The soft tissues are within normal limits. IMPRESSION: No active cardiopulmonary disease. Electronically Signed   By: Jules Schick M.D.   On: 05/05/2023 11:47

## 2023-06-03 LAB — PROTEIN S PANEL
Protein S Activity: 114 % (ref 63–140)
Protein S Ag, Free: 114 % (ref 61–136)
Protein S Ag, Total: 93 % (ref 60–150)

## 2023-06-03 LAB — ANTITHROMBIN PANEL
AT III AG PPP IMM-ACNC: 87 % (ref 72–124)
Antithrombin Activity: 134 % (ref 75–135)

## 2023-06-03 LAB — LUPUS ANTICOAGULANT PANEL
DRVVT: 36.6 s (ref 0.0–47.0)
PTT Lupus Anticoagulant: 28.4 s (ref 0.0–43.5)

## 2023-06-03 LAB — CARDIOLIPIN ANTIBODIES, IGM+IGG
Anticardiolipin IgG: <9 GPL U/mL (ref 0–14)
Anticardiolipin IgM: <9 [MPL'U]/mL (ref 0–12)

## 2023-06-03 LAB — PROTEIN C ACTIVITY: Protein C Activity: 65 % — ABNORMAL LOW (ref 73–180)

## 2023-06-04 LAB — BETA-2-GLYCOPROTEIN I ABS, IGG/M/A
Beta-2 Glyco I IgG: <9 GPI IgG units (ref 0–20)
Beta-2-Glycoprotein I IgA: <9 GPI IgA units (ref 0–25)
Beta-2-Glycoprotein I IgM: <9 GPI IgM units (ref 0–32)

## 2023-06-06 LAB — PROTHROMBIN GENE MUTATION

## 2023-06-06 LAB — FACTOR 5 LEIDEN

## 2023-06-09 ENCOUNTER — Other Ambulatory Visit: Payer: Self-pay | Admitting: Internal Medicine

## 2023-06-12 NOTE — Progress Notes (Deleted)
 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   First episode: 2020 presumed PE due to an elevated D-dimer test, and was briefly on Xarelto, subsequently had a CT PE study did not show any obvious clot and was taken off of Xarelto.  Second episode: Feb 2025 resolved on xarelto Risk factors: personal history. Family history. BMI >30. 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 This last episode of blood clot appeared to be unprovoked and this was the second time.  She has similar symptoms resolved with anticoagulation during the first time.  She reports not tolerating Xarelto very well with unclear cause of back pain and fatigue.  No increased bleeding.  We discussed about options of anticoagulation therapies including alternative with Eliquis.  Having gastric sleeve surgery, Xarelto is mostly absorbed through the stomach and may be an issue.  Eliquis is unlikely going to be an issue as mostly absorbed through the distal intestine.  She is still within the first 3 months of acute event and recommend proceed with complete anticoagulation for at least 3 months.  After discussion, she would like to try apixaban.  Discussed dosing schedule.  Given her history of unprovoked event, long-term anticoagulation will decrease her risk of recurrence.  We discussed risks and benefits.  For patients with unprovoked VTE, the risks of recurrent VTE after completing a course of anticoagulant therapy have been estimated to be 10% by 2  years and >30% by 10 years. ASH 2020.   Discussed thrombophilia testing was not positive. Will continue anticoagulation as tolerated.  She also has history of low B12, History of surgery.  Reports of fatigue.  Will check ferritin and B12.  No problem-specific Assessment & Plan notes found for this encounter.    Patient education for risk factors and prevention of clotting We talked about modifiable risk factors.  Prevention of clotting like deep vein thrombosis including: Strong risk factors including fractures of lower limb, hospitalization for severe illness, such as heart failure, myocardial infarction, spinal cord injury, major trauma, hip or knee replacement, and previous VTE. avoid prolonged immobilization and moving extremities every 1-2 hours during long car rides or flights.  Taking a break and moving extremities if working in a job setting with prolonged sitting.   Avoid dehydration, especially in high altitude. Avoid cigarette smoking Avoid use of hormone replacement therapy, estrogen-containing contraceptive in women.   Maintaining healthy lifestyle to prevent development of diabetes.  Weight loss if BMI over 30.  Regular exercises but not extreme.   Other risk factors for clotting are surgery, hospitalization, inflammatory disease or severe infection, trauma or injuries from inflammatory state and stasis. If developing one-sided leg swelling, pain, color change, chest pain, sudden short of breath, difficulty taking deep breaths, taking deep breath with chest discomfort or pain, dizziness or heart racing sensation, go to the emergency room immediately for evaluation. If developing trauma, uncontrolled bleeding, such as bloody stools report ED immediately. Avoid NSAIDs, aspirin while on blood thinner.  Patient should avoid elective surgery in the acute thrombosis period for 3 months.  Anticoagulants do not cause bleeding.  Rather, if bleeding occurs when one is on  anticoagulation, it may take longer for the bleeding to stop due to reduce coagulation capacity.  No orders of the defined types were placed in this encounter.   All questions were answered. The patient knows to call the clinic with any problems, questions or concerns. The total time spent in the appointment was 60 minutes encounter with patients including review of chart and various tests results, discussions about plan of care and coordination of care plan  Melven Sartorius, MD 3/27/202510:39 PM  CHIEF COMPLAINTS/PURPOSE OF CONSULTATION:  Recurrent PE  HISTORY OF PRESENTING ILLNESS:  Bridget [name] 48 y.o. female is here because of history of PE.  2011 [place] LLE: No evidence of deep venous thrombosis in the left lower extremity.   06/30/18 d-dimer 2.26 due to COVID hospitals had difficulty getting her in quickly. She started Xarelto. She started for a few days. Report pain in the chest, pressure and couldn't take a deep breath.  07/01/18 CTA: No evidence of pulmonary emboli or other acute abnormality in the chest.  She reported being on it for about 6 months. Report of heavier cycles.  Report in April of 2020 she had a PE and got a CT scan was negative for PE. Her symptoms of SOB came on quickly and she had palpitations as well. Pt reports she was told that the blood clot could have been missed due to being on Xarelto. At the time her D-dimer was high and she was having SOB so her PCP placed her on Xarelto for 6 months   08/19/19 saw Dr. Candise Che at [facility].   10/2020 CTA: No evidence of pulmonary embolism or acute cardiopulmonary disease. 2. Evidence of prior gastric surgery.  10/04/21 presented with intermittent chest pain for the last 2 weeks. CTA: No CT findings for pulmonary embolism.  11/30/21 presented with shortness of breath, chest tightness, discomfort in her left lateral chest and back, CTA: No PE  07/2023 CTA: No evidence of pulmonary embolism or other acute intrathoracic  process.  04/29/23 report new short of breath first thought was heat. She then started feeling hart to breath and pain on deep breaths from center to back. CTA: Small amount of pulmonary embolism involving multiple posterior lower lobe branches of the right pulmonary artery.  She was started on Xarelto.  No long drive, flight, trauma, surgery, injury, HRT or estrogen, smoking cigarette.   05/05/23 presented to the ED with complaint of chest pain. CTA: Persistent but improved subsegmental pulmonary emboli in the right lower lobe. 2. No new or progressive pulmonary embolus.  3. Clear lungs.  05/28/23 presented to ED with mid and LT chest pain and upper back pain that has worsened over the last week  CTA: Previous right lower lobe pulmonary emboli have resolved. No new, persistent or recurrent pulmonary embolus.  She had gastric sleeve in 2021.   Father had long drive to [facility] and had DVT. He has another one but not clear the setting.  First cousin on paternal side died of PE.  No bloody or dark stool. No bloody urine or vaginal bleeding. She has regular periods.  MEDICAL HISTORY:  Past Medical History:  Diagnosis Date   Constipation    Edema    Headache(784.0)    Morbid obesity (HCC)    Pulmonary embolism (HCC)     SURGICAL HISTORY: Past Surgical History:  Procedure Laterality Date   BREAST REDUCTION SURGERY  BREAST SURGERY  2005   reduction   CESAREAN SECTION N/A 03/20/2016   Procedure: CESAREAN SECTION;  Surgeon: Gerald Leitz, MD;  Location: [facility] BIRTHING SUITES;  Service: Obstetrics;  Laterality: N/A;   COLONOSCOPY WITH PROPOFOL N/A 05/11/2020   Procedure: COLONOSCOPY WITH PROPOFOL;  Surgeon: Rachael Fee, MD;  Location: [facility] ENDOSCOPY;  Service: Endoscopy;  Laterality: N/A;   POLYPECTOMY  05/11/2020   Procedure: POLYPECTOMY;  Surgeon: Rachael Fee, MD;  Location: [facility];  Service: Endoscopy;;   TUBAL LIGATION Bilateral 03/20/2016   Procedure: BILATERAL TUBAL LIGATION;   Surgeon: Gerald Leitz, MD;  Location: [facility] BIRTHING SUITES;  Service: Obstetrics;  Laterality: Bilateral;    SOCIAL HISTORY: Social History   Socioeconomic History   Marital status: Married    Spouse name: Not on file   Number of children: 1   Years of education: Not on file   Highest education level: Not on file  Occupational History    Employer: [employer]  Tobacco Use   Smoking status: Never   Smokeless tobacco: Never  Vaping Use   Vaping status: Never Used  Substance and Sexual Activity   Alcohol use: Not Currently   Drug use: No   Sexual activity: Yes    Birth control/protection: None  Other Topics Concern   Not on file  Social History Narrative   Not on file   Social Drivers of Health   Financial Resource Strain: Not on file  Food Insecurity: No Food Insecurity (03/02/2020)   Received from [facility] visits prior to 05/18/2022., [facility] visits prior to 05/18/2022.   Hunger Vital Sign    Worried About Programme researcher, broadcasting/film/video in the Last Year: Never true    Ran Out of Food in the Last Year: Never true  Transportation Needs: Not on file  Physical Activity: Not on file  Stress: Not on file  Social Connections: Not on file  Intimate Partner Violence: Not on file    FAMILY HISTORY: Family History  Problem Relation Age of Onset   Deep vein thrombosis Father    Diabetes Father    Hypertension Mother    Hypertension Other    Diabetes Paternal Aunt    Heart disease Maternal Grandmother    Heart disease Maternal Grandfather    Diabetes Paternal Grandmother     ALLERGIES:  is allergic to meloxicam.  MEDICATIONS:  Current Outpatient Medications  Medication Sig Dispense Refill   acetaminophen (TYLENOL) 500 MG tablet Take 1,000 mg by mouth every 6 (six) hours as needed for mild pain.     albuterol (VENTOLIN HFA) 108 (90 Base) MCG/ACT inhaler Inhale 1-2 puffs into the lungs every 6 (six) hours as needed for wheezing or  shortness of breath. 18 g 0   apixaban (ELIQUIS) 5 MG TABS tablet Take 1 tablet (5 mg total) by mouth 2 (two) times daily. 120 tablet 0   cetirizine (ZYRTEC) 10 MG tablet Take 10 mg by mouth daily as needed for allergies.     Cholecalciferol (VITAMIN D3) 125 MCG (5000 UT) CAPS Take 1 capsule by mouth daily.     clonazePAM (KLONOPIN) 0.5 MG disintegrating tablet Take 1-2 tablets (0.5-1 mg total) by mouth 2 (two) times daily as needed (panic attacks). 60 tablet 1   Cyanocobalamin (VITAMIN B-12) 1000 MCG SUBL Place 1 tablet (1,000 mcg total) under the tongue daily. 100 tablet 3   cyclobenzaprine (FLEXERIL) 10 MG tablet Take 1 tablet (10 mg total) by  mouth 2 (two) times daily as needed for muscle spasms. 20 tablet 0   furosemide (LASIX) 20 MG tablet TAKE 1 TO 2 TABLETS BY MOUTH ONCE DAILY AS NEEDED (Patient not taking: Reported on 06/02/2023)     lidocaine (LIDODERM) 5 % Place 1 patch onto the skin daily as needed. Remove & Discard patch within 12 hours or as directed by MD 15 patch 0   meloxicam (MOBIC) 15 MG tablet Take 1 tablet (15 mg total) by mouth daily. (Patient not taking: Reported on 06/02/2023) 30 tablet 0   Multiple Vitamin (MULTIVITAMIN) capsule Take 1 capsule by mouth daily.     oxyCODONE (ROXICODONE) 5 MG immediate release tablet Take 1 tablet (5 mg total) by mouth every 6 (six) hours as needed. 10 tablet 0   pantoprazole (PROTONIX) 40 MG tablet Take 1 tablet (40 mg total) by mouth daily. 90 tablet 01   Potassium Chloride ER 20 MEQ TBCR Take 1 tablet by mouth twice daily 60 tablet 5   potassium chloride SA (KLOR-CON M) 20 MEQ tablet Take 1 tablet (20 mEq total) by mouth 2 (two) times daily. TAKE ONE TABLET TWO TIMES A DAY 180 tablet 3   Rivaroxaban (XARELTO) 15 MG TABS tablet Take 1 tablet (15 mg total) by mouth 2 (two) times daily with a meal. 42 tablet 10   rizatriptan (MAXALT) 10 MG tablet Take 1 tablet (10 mg total) by mouth daily as needed for migraine. May repeat in 2 hours if needed 12  tablet 5   Tirzepatide-Weight Management (ZEPBOUND Gregory)      traMADol (ULTRAM) 50 MG tablet Take 1 tablet by mouth every 6 (six) hours as needed.     triamterene-hydrochlorothiazide (MAXZIDE-25) 37.5-25 MG tablet Take 1 tablet by mouth daily.     No current facility-administered medications for this visit.    REVIEW OF SYSTEMS:   All relevant systems were reviewed with the patient and are negative.  PHYSICAL EXAMINATION:  There were no vitals filed for this visit.  There were no vitals filed for this visit.   GENERAL: alert, no distress and comfortable. overweight LUNGS: Effort normal and no respiratory distress.  O2 sat 100% Musculoskeletal:  bilateral lower extremity no significant edema  LABORATORY DATA:  I have reviewed the data as listed   RADIOGRAPHIC STUDIES: I have personally reviewed the radiological images as listed and agreed with the findings in the report. CT Angio Chest PE W and/or Wo Contrast Result Date: 05/28/2023 CLINICAL DATA:  Pulmonary embolism (PE) suspected, high prob Pain. EXAM: CT ANGIOGRAPHY CHEST WITH CONTRAST TECHNIQUE: Multidetector CT imaging of the chest was performed using the standard protocol during bolus administration of intravenous contrast. Multiplanar CT image reconstructions and MIPs were obtained to evaluate the vascular anatomy. RADIATION DOSE REDUCTION: This exam was performed according to the departmental dose-optimization program which includes automated exposure control, adjustment of the mA and/or kV according to patient size and/or use of iterative reconstruction technique. CONTRAST:  OMNIPAQUE IOHEXOL 350 MG/ML SOLN COMPARISON:  Radiograph 05/16/2023. Chest CTA 05/05/2023, 04/29/2023. FINDINGS: Cardiovascular: Previous right lower lobe pulmonary emboli have resolved. No new, persistent or recurrent pulmonary embolus. The heart is normal in size. No pericardial effusion. Normal thoracic aorta. Mediastinum/Nodes: No mediastinal or hilar  adenopathy. Unremarkable esophagus. No thyroid nodule. Lungs/Pleura: Clear lungs. No focal airspace disease or pleural effusion. No features of pulmonary edema. No pneumothorax. Trachea and central airways are clear. Upper Abdomen: Postsurgical change in the stomach. No acute findings. Musculoskeletal: Scoliosis and degenerative  change in the spine. There are no acute or suspicious osseous abnormalities. Review of the MIP images confirms the above findings. IMPRESSION: 1. Previous right lower lobe pulmonary emboli have resolved. No new, persistent or recurrent pulmonary embolus. 2. No acute intrathoracic abnormality. Electronically Signed   By: Narda Rutherford M.D.   On: 05/28/2023 22:18   DG Chest 2 View Result Date: 05/16/2023 CLINICAL DATA:  Worsening cough. EXAM: CHEST - 2 VIEW COMPARISON:  05/05/2023 FINDINGS: Heart size and pulmonary vascularity are normal. Lungs are clear. No pleural effusion or pneumothorax. Mediastinal contours appear intact. Thoracolumbar scoliosis convex towards the right. Surgical clips in the left upper quadrant. IMPRESSION: No active cardiopulmonary disease. Electronically Signed   By: Burman Nieves M.D.   On: 05/16/2023 17:40

## 2023-06-17 ENCOUNTER — Inpatient Hospital Stay

## 2023-06-19 ENCOUNTER — Telehealth: Payer: Self-pay

## 2023-06-19 NOTE — Telephone Encounter (Signed)
 Bridget [name] called in to reschedule her missed appointment. Bridget [name] was very apologetic about missing her appointmnet.

## 2023-06-24 ENCOUNTER — Ambulatory Visit: Payer: Self-pay

## 2023-06-24 NOTE — Telephone Encounter (Signed)
  Chief Complaint: Upper right abdominal pain Symptoms: Pain and nausea after eating Frequency: ongoing for about 1 week Pertinent Negatives: Patient denies other s/s Disposition: [] ED /[x] Urgent Care (no appt availability in office) / [] Appointment(In office/virtual)/ []  [facility]/ [] Home Care/ [] Refused Recommended Disposition /[] [facility]/ []  Follow-up with PCP Additional Notes: PT states that pain has come and gone for about 1 week. Pain and nausea starts soon after eating. Pt has googled and feel that this might be a gall bladder issue. Pain is 8-9/10 after eating. Pt will go to UC this evening.    Copied from CRM [id number]. Topic: Clinical - Red Word Triage >> Jun 24, 2023  5:21 PM Taleah C wrote: Red Word that prompted transfer to Nurse Triage: galbaldder pain, right side, feels nauseous , 9/10. Reason for Disposition  [1] MILD-MODERATE pain AND [2] constant AND [3] present > 2 hours  Answer Assessment - Initial Assessment Questions 1. LOCATION: "Where does it hurt?"      Upper right side 2. RADIATION: "Does the pain shoot anywhere else?" (e.g., chest, back)     no 3. ONSET: "When did the pain begin?" (e.g., minutes, hours or days ago)      1 week - comes and goes with eating.  4. SUDDEN: "Gradual or sudden onset?"     sudden 5. PATTERN "Does the pain come and go, or is it constant?"    - If it comes and goes: "How long does it last?" "Do you have pain now?"     (Note: Comes and goes means the pain is intermittent. It goes away completely between bouts.)    - If constant: "Is it getting better, staying the same, or getting worse?"      (Note: Constant means the pain never goes away completely; most serious pain is constant and gets worse.)      Comes and goes 6. SEVERITY: "How bad is the pain?"  (e.g., Scale 1-10; mild, moderate, or severe)    - MILD (1-3): Doesn't interfere with normal activities, abdomen soft and not tender to touch.     - MODERATE  (4-7): Interferes with normal activities or awakens from sleep, abdomen tender to touch.     - SEVERE (8-10): Excruciating pain, doubled over, unable to do any normal activities.       severe 7. RECURRENT SYMPTOM: "Have you ever had this type of stomach pain before?" If Yes, ask: "When was the last time?" and "What happened that time?"      Thinks it might be her gall bladder 8. CAUSE: "What do you think is causing the stomach pain?"     Gall bladder 9. RELIEVING/AGGRAVATING FACTORS: "What makes it better or worse?" (e.g., antacids, bending or twisting motion, bowel movement)     Magnesium pill and Tylenol helped. 10. OTHER SYMPTOMS: "Do you have any other symptoms?" (e.g., back pain, diarrhea, fever, urination pain, vomiting)       Nausea  Protocols used: Abdominal Pain - Female-A-AH

## 2023-06-25 DIAGNOSIS — Z9884 Bariatric surgery status: Secondary | ICD-10-CM | POA: Insufficient documentation

## 2023-06-25 NOTE — Assessment & Plan Note (Signed)
 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

## 2023-06-25 NOTE — Assessment & Plan Note (Signed)
 Will check ferritin, b12, MMA on next visit.

## 2023-06-25 NOTE — Progress Notes (Addendum)
 [facility] CONSULT NOTE  Patient Care Team: Plotnikov, Oakley Bellman, MD as PCP - General (Internal Medicine) Nahser, Lela Purple, MD as PCP - Cardiology (Cardiology) Frankie Israel, MD as Consulting Physician (Hematology) Diamond Formica, MD as Consulting Physician (Pulmonary Disease)  Visit type: telephone visit. 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 for history of history of PE.   First episode: 2020 presumed PE due to an elevated D-dimer test, and was briefly on Xarelto , subsequently had a CT PE study did not show any obvious clot and was taken off of Xarelto .  Second episode: Feb 2025 resolved on xarelto  Risk factors: personal history. Family history. BMI >30. Setting: unprovoked Treatment: xarelto  switched to apixaban   Discussed unprovoked vs provoked VTE. This last episode of blood clot appeared to be unprovoked and this was the second time.  She has similar symptoms resolved with anticoagulation during the first time.  She is feeling better on Eliquis .    Given her history of unprovoked event, long-term anticoagulation will decrease her risk of recurrence.  We discussed risks and benefits.  For patients with unprovoked VTE, the risks of recurrent VTE after completing a course of anticoagulant therapy have been estimated to be 10% by 2 years and >30% by 10 years. ASH 2020.   Discussed thrombophilia testing was not positive. Will continue anticoagulation as tolerated.  She also has history of low B12, History of surgery.  Reports of fatigue.  Will check ferritin and B12.  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   Orders  Placed This Encounter  Procedures   Vitamin B12    Standing Status:   Future    Expiration Date:   06/25/2024   Ferritin    Standing Status:   Future    Expiration Date:   06/25/2024   Iron and Iron Binding Capacity ([facility] only)    Standing Status:   Future    Expiration Date:   06/25/2024   Methylmalonic acid, serum    Standing Status:   Future    Expiration Date:   08/26/2023    All questions were answered. The patient knows to call the clinic with any problems, questions or concerns.  The total time spent in the appointment was 22 minutes encounter with patients including review of chart and various tests results, discussions about plan of care and coordination of care plan  Lowanda Ruddy, MD 4/10/20254:21 PM  CHIEF COMPLAINTS/PURPOSE OF CONSULTATION:  Recurrent PE  HISTORY OF PRESENTING ILLNESS:  Bridget [name] 48 y.o. female is being follow up because of history of PE. She feels better with apixaban . Report of back pain resolved and xarelto  was harsh on her stomach. No bloody stool, melena, vaginal bleeding, hematuria.  5 days of periods but not heavier.  VTE History:  2011 [place]  LLE: No evidence of deep venous thrombosis in the left lower extremity.   06/30/18 d-dimer 2.26 due to COVID hospitals had difficulty getting her in quickly. She started Xarelto . She started for a few days. Report pain in the chest, pressure and couldn't take a deep breath.  07/01/18 CTA: No evidence of pulmonary emboli or other acute abnormality in  the chest.  She reported being on it for about 6 months. Report of heavier cycles.  Report in April of 2020 she had a PE and got a CT scan was negative for PE. Her symptoms of SOB came on quickly and she had palpitations as well. Pt reports she was told that the blood clot could have been missed due to being on Xarelto . At the time her D-dimer was high and she was having SOB so her PCP placed her on Xarelto  for 6 months   08/19/19 saw Dr. Salomon Cree at [facility].    10/2020 CTA: No evidence of pulmonary embolism or acute cardiopulmonary disease. 2. Evidence of prior gastric surgery.  10/04/21 presented with intermittent chest pain for the last 2 weeks. CTA: No CT findings for pulmonary embolism.  11/30/21 presented with shortness of breath, chest tightness, discomfort in her left lateral chest and back, CTA: No PE  07/2023 CTA: No evidence of pulmonary embolism or other acute intrathoracic process.  04/29/23 report new short of breath first thought was heat. She then started feeling hart to breath and pain on deep breaths from center to back. CTA: Small amount of pulmonary embolism involving multiple posterior lower lobe branches of the right pulmonary artery.  She was started on Xarelto .  No long drive, flight, trauma, surgery, injury, HRT or estrogen, smoking cigarette.   05/05/23 presented to the ED with complaint of chest pain. CTA: Persistent but improved subsegmental pulmonary emboli in the right lower lobe. 2. No new or progressive pulmonary embolus.  3. Clear lungs.  05/28/23 presented to ED with mid and LT chest pain and upper back pain that has worsened over the last week  CTA: Previous right lower lobe pulmonary emboli have resolved. No new, persistent or recurrent pulmonary embolus.  She had gastric sleeve in 2021.   Father had long drive to [facility] and had DVT. He has another one but not clear the setting.  First cousin on paternal side died of PE.  No bloody or dark stool. No bloody urine or vaginal bleeding. She has regular periods.  MEDICAL HISTORY:  Past Medical History:  Diagnosis Date   Constipation    Edema    Headache(784.0)    Morbid obesity (HCC)    Pulmonary embolism (HCC)    ALLERGIES:  is allergic to meloxicam .  MEDICATIONS:  Current Outpatient Medications  Medication Sig Dispense Refill   acetaminophen  (TYLENOL ) 500 MG tablet Take 1,000 mg by mouth every 6 (six) hours as needed for mild pain.     albuterol   (VENTOLIN  HFA) 108 (90 Base) MCG/ACT inhaler Inhale 1-2 puffs into the lungs every 6 (six) hours as needed for wheezing or shortness of breath. 18 g 0   apixaban  (ELIQUIS ) 5 MG TABS tablet Take 1 tablet (5 mg total) by mouth 2 (two) times daily. 120 tablet 0   cetirizine (ZYRTEC) 10 MG tablet Take 10 mg by mouth daily as needed for allergies.     Cholecalciferol  (VITAMIN D3) 125 MCG (5000 UT) CAPS Take 1 capsule by mouth daily.     clonazePAM  (KLONOPIN ) 0.5 MG disintegrating tablet Take 1-2 tablets (0.5-1 mg total) by mouth 2 (two) times daily as needed (panic attacks). 60 tablet 1   Cyanocobalamin  (VITAMIN B-12) 1000 MCG SUBL Place 1 tablet (1,000 mcg total) under the tongue daily. 100 tablet 3   cyclobenzaprine  (FLEXERIL ) 10 MG tablet Take 1 tablet (10 mg total) by mouth 2 (two) times daily as needed for muscle spasms. 20 tablet 0  furosemide  (LASIX ) 20 MG tablet TAKE 1 TO 2 TABLETS BY MOUTH ONCE DAILY AS NEEDED (Patient not taking: Reported on 06/02/2023)     lidocaine  (LIDODERM ) 5 % Place 1 patch onto the skin daily as needed. Remove & Discard patch within 12 hours or as directed by MD 15 patch 0   meloxicam  (MOBIC ) 15 MG tablet Take 1 tablet (15 mg total) by mouth daily. (Patient not taking: Reported on 06/02/2023) 30 tablet 0   Multiple Vitamin (MULTIVITAMIN) capsule Take 1 capsule by mouth daily.     oxyCODONE  (ROXICODONE ) 5 MG immediate release tablet Take 1 tablet (5 mg total) by mouth every 6 (six) hours as needed. 10 tablet 0   pantoprazole  (PROTONIX ) 40 MG tablet Take 1 tablet (40 mg total) by mouth daily. 90 tablet 01   Potassium Chloride  ER 20 MEQ TBCR Take 1 tablet by mouth twice daily 60 tablet 5   potassium chloride  SA (KLOR-CON  M) 20 MEQ tablet Take 1 tablet (20 mEq total) by mouth 2 (two) times daily. TAKE ONE TABLET TWO TIMES A DAY 180 tablet 3   rizatriptan  (MAXALT ) 10 MG tablet Take 1 tablet (10 mg total) by mouth daily as needed for migraine. May repeat in 2 hours if needed 12  tablet 5   Tirzepatide-Weight Management (ZEPBOUND [place])      traMADol  (ULTRAM ) 50 MG tablet Take 1 tablet by mouth every 6 (six) hours as needed.     triamterene -hydrochlorothiazide (MAXZIDE-25) 37.5-25 MG tablet Take 1 tablet by mouth daily.     No current facility-administered medications for this visit.   LABORATORY DATA:  I have reviewed the lab relevant to the visit.

## 2023-06-26 ENCOUNTER — Telehealth: Payer: Self-pay | Admitting: Gastroenterology

## 2023-06-26 ENCOUNTER — Inpatient Hospital Stay

## 2023-06-26 DIAGNOSIS — I2699 Other pulmonary embolism without acute cor pulmonale: Secondary | ICD-10-CM

## 2023-06-26 DIAGNOSIS — Z86711 Personal history of pulmonary embolism: Secondary | ICD-10-CM | POA: Insufficient documentation

## 2023-06-26 DIAGNOSIS — Z79899 Other long term (current) drug therapy: Secondary | ICD-10-CM | POA: Insufficient documentation

## 2023-06-26 DIAGNOSIS — Z9884 Bariatric surgery status: Secondary | ICD-10-CM | POA: Diagnosis not present

## 2023-06-26 NOTE — Telephone Encounter (Signed)
 Good afternoon Dr. Barron Alvine,   Doc of Day PM   We received a call from this patient wishing to schedule an appointment due to diverticulitis. Patient was last seen by [facility] and had an EGD in 03/2022. Patient now states she does not wish to continue care with them but would like to establish GI care with our practice again. Patient was a previous Christella Hartigan patient and was last seen with him in 03/2020. Records from procedures and visits are in Care Everywhere for you to review. Would you please advise on scheduling?  Thank you.

## 2023-06-27 ENCOUNTER — Telehealth: Payer: Self-pay

## 2023-06-27 NOTE — Telephone Encounter (Signed)
 Patient scheduled appointments. Patient is aware of all appointment details.

## 2023-06-27 NOTE — Telephone Encounter (Signed)
 Chart reviewed and I cannot accommodate this transfer request at this time.  I recommend she continue her care at [facility] where she has been following and has additional resources available.

## 2023-06-30 NOTE — Telephone Encounter (Addendum)
 Good afternoon Dr Karene Oto   I called patient to advised her of your previous recommendations in regards to transfer.   Patient states she has already discontinued [facility] care with [facility] due to driving distance.   Patient inquiring if you would reconsider transfer. Could you please advise?  Thank you for your time.

## 2023-07-01 ENCOUNTER — Telehealth: Payer: Self-pay

## 2023-07-01 DIAGNOSIS — I2699 Other pulmonary embolism without acute cor pulmonale: Secondary | ICD-10-CM

## 2023-07-01 NOTE — Telephone Encounter (Signed)
 TC from pt to report sob since yesterday. She denies pain, diaphoresis, nausea and any other acute s/s. She states she is concerned due to her hx of PE, but she also gets sob when her hemoglobin/iron is low. She has not missed a dose of Eliquis. Discussed situation w/ Dr. Alita Irwin. New orders for labs tomorrow morning. Per Dr. Alita Irwin, advised pt to go to the ED for worsening/severe sob and/or onset of chest pain or other acute s/s such as diaphoresis or nausea. Pt verbalizes understanding. Scheduling notified to set up lab appt as early as possible tomorrow.

## 2023-07-01 NOTE — Telephone Encounter (Signed)
 Per a secure chat I received I was advised to schedule Bridget [name] on tomorrow for a lab only appt. Patient scheduled appointments. Patient is aware of all appointment details.

## 2023-07-02 ENCOUNTER — Inpatient Hospital Stay

## 2023-07-02 DIAGNOSIS — I2699 Other pulmonary embolism without acute cor pulmonale: Secondary | ICD-10-CM

## 2023-07-02 DIAGNOSIS — Z9884 Bariatric surgery status: Secondary | ICD-10-CM

## 2023-07-02 DIAGNOSIS — Z79899 Other long term (current) drug therapy: Secondary | ICD-10-CM | POA: Diagnosis not present

## 2023-07-02 DIAGNOSIS — Z86711 Personal history of pulmonary embolism: Secondary | ICD-10-CM | POA: Diagnosis present

## 2023-07-02 LAB — CMP (CANCER CENTER ONLY)
ALT: 12 U/L (ref 0–44)
AST: 14 U/L — ABNORMAL LOW (ref 15–41)
Albumin: 4.2 g/dL (ref 3.5–5.0)
Alkaline Phosphatase: 46 U/L (ref 38–126)
Anion gap: 6 (ref 5–15)
BUN: 12 mg/dL (ref 6–20)
CO2: 30 mmol/L (ref 22–32)
Calcium: 9.5 mg/dL (ref 8.9–10.3)
Chloride: 103 mmol/L (ref 98–111)
Creatinine: 0.57 mg/dL (ref 0.44–1.00)
GFR, Estimated: >60 mL/min (ref >60)
Glucose, Bld: 89 mg/dL (ref 70–99)
Potassium: 3.5 mmol/L (ref 3.5–5.1)
Sodium: 139 mmol/L (ref 135–145)
Total Bilirubin: 1.2 mg/dL (ref 0.0–1.2)
Total Protein: 7.2 g/dL (ref 6.5–8.1)

## 2023-07-02 LAB — IRON AND IRON BINDING CAPACITY (CC-WL,HP ONLY)
Iron: 61 ug/dL (ref 28–170)
Saturation Ratios: 17 % (ref 10.4–31.8)
TIBC: 357 ug/dL (ref 250–450)
UIBC: 296 ug/dL

## 2023-07-02 LAB — CBC WITH DIFFERENTIAL (CANCER CENTER ONLY)
Abs Immature Granulocytes: 0.01 10*3/uL (ref 0.00–0.07)
Basophils Absolute: 0 10*3/uL (ref 0.0–0.1)
Basophils Relative: 1 %
Eosinophils Absolute: 0.2 10*3/uL (ref 0.0–0.5)
Eosinophils Relative: 2 %
HCT: 40.6 % (ref 36.0–46.0)
Hemoglobin: 13.7 g/dL (ref 12.0–15.0)
Immature Granulocytes: 0 %
Lymphocytes Relative: 29 %
Lymphs Abs: 2.2 10*3/uL (ref 0.7–4.0)
MCH: 31.9 pg (ref 26.0–34.0)
MCHC: 33.7 g/dL (ref 30.0–36.0)
MCV: 94.6 fL (ref 80.0–100.0)
Monocytes Absolute: 0.5 10*3/uL (ref 0.1–1.0)
Monocytes Relative: 7 %
Neutro Abs: 4.7 10*3/uL (ref 1.7–7.7)
Neutrophils Relative %: 61 %
Platelet Count: 359 10*3/uL (ref 150–400)
RBC: 4.29 MIL/uL (ref 3.87–5.11)
RDW: 11.9 % (ref 11.5–15.5)
WBC Count: 7.6 10*3/uL (ref 4.0–10.5)
nRBC: 0 % (ref 0.0–0.2)

## 2023-07-02 LAB — VITAMIN B12: Vitamin B-12: 798 pg/mL (ref 180–914)

## 2023-07-02 LAB — FERRITIN: Ferritin: 36 ng/mL (ref 11–307)

## 2023-07-02 LAB — D-DIMER, QUANTITATIVE: D-Dimer, Quant: <0.27 ug{FEU}/mL (ref 0.00–0.50)

## 2023-07-03 ENCOUNTER — Telehealth: Payer: Self-pay

## 2023-07-03 NOTE — Telephone Encounter (Signed)
 Late entry for 07/02/23 @ 1700 TC to advise that there are no concerning lab results in regards to her s/s on 4/15. Still some labs pending. Pt states she is feeling better today, less sob. Advised that Dr. [name] Nickel office will be in touch for any abnormal results. Informed to call office for any problems or concerns. She is aware of acute symptoms of PE and knows to go to the ED if these s/s develop.

## 2023-07-06 LAB — METHYLMALONIC ACID, SERUM: Methylmalonic Acid, Quantitative: 71 nmol/L (ref 0–378)

## 2023-07-11 ENCOUNTER — Telehealth: Payer: Self-pay

## 2023-07-11 NOTE — Telephone Encounter (Signed)
 ONO reviewed by Dr. Auston Left- Low SpO2 93%. No oxygen  needed.  I have notified the patient.  Nothing further needed.

## 2023-07-14 ENCOUNTER — Other Ambulatory Visit

## 2023-07-15 NOTE — Telephone Encounter (Signed)
 Patient advised of previous recommendations. Will call to schedule when symptoms occur.

## 2023-07-21 ENCOUNTER — Encounter: Payer: Self-pay | Admitting: Internal Medicine

## 2023-07-21 ENCOUNTER — Ambulatory Visit (INDEPENDENT_AMBULATORY_CARE_PROVIDER_SITE_OTHER): Admitting: Internal Medicine

## 2023-07-21 VITALS — Ht 64.0 in | Wt 281.0 lb

## 2023-07-21 DIAGNOSIS — E876 Hypokalemia: Secondary | ICD-10-CM | POA: Diagnosis not present

## 2023-07-21 DIAGNOSIS — I2699 Other pulmonary embolism without acute cor pulmonale: Secondary | ICD-10-CM

## 2023-07-21 DIAGNOSIS — Z6841 Body Mass Index (BMI) 40.0 and over, adult: Secondary | ICD-10-CM

## 2023-07-21 DIAGNOSIS — E65 Localized adiposity: Secondary | ICD-10-CM | POA: Insufficient documentation

## 2023-07-21 DIAGNOSIS — G43109 Migraine with aura, not intractable, without status migrainosus: Secondary | ICD-10-CM | POA: Diagnosis not present

## 2023-07-21 MED ORDER — POTASSIUM CHLORIDE CRYS ER 20 MEQ PO TBCR: 20.0000 meq | EXTENDED_RELEASE_TABLET | Freq: Three times a day (TID) | ORAL | 3 refills | Status: DC

## 2023-07-21 MED ORDER — TIRZEPATIDE 10 MG/0.5ML ~~LOC~~ SOAJ: 10.0000 mg | SUBCUTANEOUS | Status: AC

## 2023-07-21 NOTE — Assessment & Plan Note (Addendum)
 Bridget [name] was diagnosed with 3 blood clots in the lungs in Feb 2025. Patient would like to know how long she should be on elequis and recommendations./Recently had labs through oncologist and showed  On Eliquis  F/u w/Dr Alita Irwin

## 2023-07-21 NOTE — Progress Notes (Unsigned)
 Subjective:  Patient ID: Bridget [name], female    DOB: [birth year]  Age: 48 y.o. MRN: [id number]  CC: Medication Management (Patient visits today to review some medication changes as well as new health problems. Newly diagnosed with blood clots. Patient would like to know how long she should be on elequis and recommendations./Recently had labs through oncologist and showed potassium as low. Currently taking potassium supplements now, 2 a day, but sometimes patient is taking them 3 times daily./Patient still having migraines and wants to know if we can renew FMLA) and Weight Loss (Patient notes still losing weight, had weight loss surgery in the past. Would like to know if Dr.Kaylla Cobos would recommend or refer patient to a surgeon for removal of excess skin on the lower abdomen)   HPI Bridget [name] presents to review some medication changes as well as new health problems - diagnosed with 3 blood clots in the lungs in Feb 2025. Patient would like to know how long she should be on elequis and recommendations./Recently had labs through oncologist and showed potassium as low. Currently taking potassium supplements now, 2 a day, but sometimes patient is taking them 3 times daily./Patient still having migraines and wants to know if we can renew FMLA) and Weight Loss (Patient notes still losing weight, had weight loss surgery in the past. Would like to know if Dr.Lonzo Saulter would recommend or refer patient to a surgeon for removal of excess skin on the lower abdomen)  Outpatient Medications Prior to Visit  Medication Sig Dispense Refill  . acetaminophen  (TYLENOL ) 500 MG tablet Take 1,000 mg by mouth every 6 (six) hours as needed for mild pain.    . albuterol  (VENTOLIN  HFA) 108 (90 Base) MCG/ACT inhaler Inhale 1-2 puffs into the lungs every 6 (six) hours as needed for wheezing or shortness of breath. 18 g 0  . apixaban  (ELIQUIS ) 5 MG TABS tablet Take 1 tablet (5 mg total) by mouth 2 (two) times daily. 120  tablet 0  . cetirizine (ZYRTEC) 10 MG tablet Take 10 mg by mouth daily as needed for allergies.    . Cholecalciferol  (VITAMIN D3) 125 MCG (5000 UT) CAPS Take 1 capsule by mouth daily.    . clonazePAM  (KLONOPIN ) 0.5 MG disintegrating tablet Take 1-2 tablets (0.5-1 mg total) by mouth 2 (two) times daily as needed (panic attacks). 60 tablet 1  . Cyanocobalamin  (VITAMIN B-12) 1000 MCG SUBL Place 1 tablet (1,000 mcg total) under the tongue daily. 100 tablet 3  . cyclobenzaprine  (FLEXERIL ) 10 MG tablet Take 1 tablet (10 mg total) by mouth 2 (two) times daily as needed for muscle spasms. 20 tablet 0  . lidocaine  (LIDODERM ) 5 % Place 1 patch onto the skin daily as needed. Remove & Discard patch within 12 hours or as directed by MD 15 patch 0  . Multiple Vitamin (MULTIVITAMIN) capsule Take 1 capsule by mouth daily.    . oxyCODONE  (ROXICODONE ) 5 MG immediate release tablet Take 1 tablet (5 mg total) by mouth every 6 (six) hours as needed. 10 tablet 0  . pantoprazole  (PROTONIX ) 40 MG tablet Take 1 tablet (40 mg total) by mouth daily. 90 tablet 01  . Potassium Chloride  ER 20 MEQ TBCR Take 1 tablet by mouth twice daily 60 tablet 5  . rizatriptan  (MAXALT ) 10 MG tablet Take 1 tablet (10 mg total) by mouth daily as needed for migraine. May repeat in 2 hours if needed 12 tablet 5  . Tirzepatide-Weight Management (ZEPBOUND [place])     .  traMADol  (ULTRAM ) 50 MG tablet Take 1 tablet by mouth every 6 (six) hours as needed.    . triamterene -hydrochlorothiazide (MAXZIDE-25) 37.5-25 MG tablet Take 1 tablet by mouth daily.    . potassium chloride  SA (KLOR-CON  M) 20 MEQ tablet Take 1 tablet (20 mEq total) by mouth 2 (two) times daily. TAKE ONE TABLET TWO TIMES A DAY 180 tablet 3  . furosemide  (LASIX ) 20 MG tablet TAKE 1 TO 2 TABLETS BY MOUTH ONCE DAILY AS NEEDED (Patient not taking: Reported on 07/21/2023)    . meloxicam  (MOBIC ) 15 MG tablet Take 1 tablet (15 mg total) by mouth daily. (Patient not taking: Reported on 07/21/2023) 30  tablet 0   No facility-administered medications prior to visit.    ROS: Review of Systems  Constitutional:  Positive for fatigue. Negative for activity change, appetite change, chills and unexpected weight change.  HENT:  Negative for congestion, mouth sores and sinus pressure.   Eyes:  Negative for visual disturbance.  Respiratory:  Negative for cough and chest tightness.   Gastrointestinal:  Negative for abdominal pain and nausea.  Genitourinary:  Negative for difficulty urinating, frequency and vaginal pain.  Musculoskeletal:  Negative for back pain and gait problem.  Skin:  Negative for pallor and rash.  Neurological:  Negative for dizziness, tremors, weakness, numbness and headaches.  Psychiatric/Behavioral:  Negative for confusion, sleep disturbance and suicidal ideas.     Objective:  Ht 5\' 4"  (1.626 m)   Wt 281 lb (127.5 kg)   BMI 48.23 kg/m   BP Readings from Last 3 Encounters:  06/02/23 129/65  05/28/23 (!) 124/90  05/22/23 128/86    Wt Readings from Last 3 Encounters:  07/21/23 281 lb (127.5 kg)  06/02/23 292 lb 11.2 oz (132.8 kg)  05/28/23 289 lb 14.5 oz (131.5 kg)    Physical Exam Constitutional:      General: She is not in acute distress.    Appearance: She is well-developed. She is obese.  HENT:     Head: Normocephalic.     Right Ear: External ear normal.     Left Ear: External ear normal.     Nose: Nose normal.  Eyes:     General:        Right eye: No discharge.        Left eye: No discharge.     Conjunctiva/sclera: Conjunctivae normal.     Pupils: Pupils are equal, round, and reactive to light.  Neck:     Thyroid : No thyromegaly.     Vascular: No JVD.     Trachea: No tracheal deviation.  Cardiovascular:     Rate and Rhythm: Normal rate and regular rhythm.     Heart sounds: Normal heart sounds.  Pulmonary:     Effort: No respiratory distress.     Breath sounds: No stridor. No wheezing.  Abdominal:     General: Bowel sounds are normal.  There is no distension.     Palpations: Abdomen is soft. There is no mass.     Tenderness: There is no abdominal tenderness. There is no guarding or rebound.  Musculoskeletal:        General: No tenderness.     Cervical back: Normal range of motion and neck supple. No rigidity.  Lymphadenopathy:     Cervical: No cervical adenopathy.  Skin:    Findings: No erythema or rash.  Neurological:     Cranial Nerves: No cranial nerve deficit.     Motor: No abnormal muscle tone.  Coordination: Coordination normal.     Deep Tendon Reflexes: Reflexes normal.  Psychiatric:        Behavior: Behavior normal.        Thought Content: Thought content normal.        Judgment: Judgment normal.    Lab Results  Component Value Date   WBC 7.6 07/02/2023   HGB 13.7 07/02/2023   HCT 40.6 07/02/2023   PLT 359 07/02/2023   GLUCOSE 89 07/02/2023   CHOL 139 10/10/2022   TRIG 81.0 10/10/2022   HDL 47.80 10/10/2022   LDLCALC 75 10/10/2022   ALT 12 07/02/2023   AST 14 (L) 07/02/2023   NA 139 07/02/2023   K 3.5 07/02/2023   CL 103 07/02/2023   CREATININE 0.57 07/02/2023   BUN 12 07/02/2023   CO2 30 07/02/2023   TSH 0.74 10/10/2022   HGBA1C 5.3 12/31/2022    CT Angio Chest PE W and/or Wo Contrast Result Date: 05/28/2023 CLINICAL DATA:  Pulmonary embolism (PE) suspected, high prob Pain. EXAM: CT ANGIOGRAPHY CHEST WITH CONTRAST TECHNIQUE: Multidetector CT imaging of the chest was performed using the standard protocol during bolus administration of intravenous contrast. Multiplanar CT image reconstructions and MIPs were obtained to evaluate the vascular anatomy. RADIATION DOSE REDUCTION: This exam was performed according to the departmental dose-optimization program which includes automated exposure control, adjustment of the mA and/or kV according to patient size and/or use of iterative reconstruction technique. CONTRAST:  OMNIPAQUE  IOHEXOL  350 MG/ML SOLN COMPARISON:  Radiograph 05/16/2023. Chest  CTA 05/05/2023, 04/29/2023. FINDINGS: Cardiovascular: Previous right lower lobe pulmonary emboli have resolved. No new, persistent or recurrent pulmonary embolus. The heart is normal in size. No pericardial effusion. Normal thoracic aorta. Mediastinum/Nodes: No mediastinal or hilar adenopathy. Unremarkable esophagus. No thyroid  nodule. Lungs/Pleura: Clear lungs. No focal airspace disease or pleural effusion. No features of pulmonary edema. No pneumothorax. Trachea and central airways are clear. Upper Abdomen: Postsurgical change in the stomach. No acute findings. Musculoskeletal: Scoliosis and degenerative change in the spine. There are no acute or suspicious osseous abnormalities. Review of the MIP images confirms the above findings. IMPRESSION: 1. Previous right lower lobe pulmonary emboli have resolved. No new, persistent or recurrent pulmonary embolus. 2. No acute intrathoracic abnormality. Electronically Signed   By: Chadwick Colonel M.D.   On: 05/28/2023 22:18    Assessment & Plan:   Problem List Items Addressed This Visit     Migraines   Taking Maxalt  prn - sometimes disabling  5d per 1 mo      Recurrent pulmonary emboli (HCC) - Primary   [place] was diagnosed with 3 blood clots in the lungs in Feb 2025. Patient would like to know how long she should be on elequis and recommendations./Recently had labs through oncologist and showed  On Eliquis  F/u w/Dr Alita Irwin      Morbid obesity with BMI of 50.0-59.9, adult (HCC)   Pt lost wt on compound Mounjaro [place]ia needs panicullectomy Pastic surgery ref      Relevant Medications   tirzepatide (MOUNJARO) 10 MG/0.5ML Pen   Other Relevant Orders   Ambulatory referral to Plastic Surgery   Hypokalemia   Pt wants to stay on Triamt/HCT 1 a day Increase KCL to 20 meq tid Pt feels bad when K is on the low end of normal      Panniculus   Pt lost wt on compound Mounjaro [place]arii needs panicullectomy Pastic surgery ref      Relevant Orders    Ambulatory referral to  Plastic Surgery      Meds ordered this encounter  Medications  . tirzepatide (MOUNJARO) 10 MG/0.5ML Pen    Sig: Inject 10 mg into the skin once a week. On a COMPOUND Rx  . potassium chloride  SA (KLOR-CON  M) 20 MEQ tablet    Sig: Take 1 tablet (20 mEq total) by mouth 3 (three) times daily. TAKE ONE TABLET TWO TIMES A DAY    Dispense:  270 tablet    Refill:  3      Follow-up: Return in about 3 months (around 10/21/2023) for a follow-up visit.  Anitra Barn, MD

## 2023-07-21 NOTE — Assessment & Plan Note (Signed)
 Taking Maxalt  prn - sometimes disabling  5d per 1 mo

## 2023-07-21 NOTE — Assessment & Plan Note (Signed)
 Pt lost wt on compound Mounjaro Senetra needs panicullectomy Pastic surgery ref

## 2023-07-21 NOTE — Patient Instructions (Signed)
 Gilles Lacks, DO Specialties and/or Subspecialties Plastic and Reconstructive Surgery  [phone] Patient Rating 4.9/5 (Based on 148 Reviews)

## 2023-07-21 NOTE — Assessment & Plan Note (Signed)
 Pt wants to stay on Triamt/HCT 1 a day Increase KCL to 20 meq tid Pt feels bad when K is on the low end of normal

## 2023-07-21 NOTE — Assessment & Plan Note (Addendum)
 Pt lost wt on compound Mounjaro Senetra needs panicullectomy Pastic surgery ref

## 2023-07-22 ENCOUNTER — Inpatient Hospital Stay: Admitting: Internal Medicine

## 2023-07-22 ENCOUNTER — Telehealth: Payer: Self-pay | Admitting: Internal Medicine

## 2023-07-22 NOTE — Telephone Encounter (Unsigned)
 Copied from CRM [id number]. Topic: Clinical - Prescription Issue >> Jul 22, 2023  9:56 AM Juleen Oakland F wrote: Reason for CRM: [facility] received to 2 different sets of directions for the potassium chloride  SA (KLOR-CON  M) 20 MEQ tablet - wants to know if it's 1 or 2 tablets daily? Please call [phone]

## 2023-07-23 MED ORDER — POTASSIUM CHLORIDE CRYS ER 20 MEQ PO TBCR: 20.0000 meq | EXTENDED_RELEASE_TABLET | Freq: Three times a day (TID) | ORAL | 3 refills | Status: DC

## 2023-07-23 NOTE — Telephone Encounter (Signed)
 I am sorry Potassium one 3 times a day is correct (in the prescription was emailed to the pharmacy).  Thank you

## 2023-07-24 NOTE — Telephone Encounter (Signed)
 Spoke with the pharmacist at [facility] and was able to inform her of the clarification for the pts Potassium. 3 times a day.

## 2023-07-28 ENCOUNTER — Inpatient Hospital Stay

## 2023-07-28 DIAGNOSIS — R790 Abnormal level of blood mineral: Secondary | ICD-10-CM | POA: Insufficient documentation

## 2023-07-28 DIAGNOSIS — E538 Deficiency of other specified B group vitamins: Secondary | ICD-10-CM | POA: Diagnosis not present

## 2023-07-28 DIAGNOSIS — I2699 Other pulmonary embolism without acute cor pulmonale: Secondary | ICD-10-CM

## 2023-07-28 DIAGNOSIS — Z903 Acquired absence of stomach [part of]: Secondary | ICD-10-CM | POA: Diagnosis not present

## 2023-07-28 NOTE — Progress Notes (Signed)
 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   Patient could not make it to appointment so changed to phone visit.  Telephone visit Patient location:  home in [place]. Physician location: [facility]   First episode: 2020 presumed PE due to an elevated D-dimer test, and was briefly on Xarelto , subsequently had a CT PE study did not show any obvious clot and was taken off of Xarelto .  Second episode: Feb 2025 resolved on xarelto  Risk factors: personal history. Family history. BMI >30. 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  She has similar symptoms resolved with anticoagulation during the first time.  She is feeling better on Eliquis .     Given her history of unprovoked event, long-term anticoagulation will decrease her risk of recurrence.  We discussed risks and benefits.  For patients with unprovoked VTE, the risks of recurrent VTE after completing a course of anticoagulant therapy have been estimated to be 10% by 2 years and >30% by 10 years. ASH 2020.    Thrombophilia testing was not positive. Will continue anticoagulation as tolerated.  She also has history of low B12, History of bariatric surgery. Heavy menstrual bleeding. Continue sublingual b12 and oral iron and repeat levels of ferritin and B12 in 3 months.  Assessment & Plan Recurrent pulmonary  emboli (HCC) Continue apixaban  5 mg twice daily  B12 deficiency On sublingual vitamin B12. May continue  History of sleeve gastrectomy Repeat b12 and iron studies next time. Low iron stores Heavy periods.  Increase oral iron to two tabs of 26 mg daily Repeat iron lab next visit.  I spent a total of 21 minutes including review of chart and various tests results with the patient, discussions about results, plan of care and follow up plan.  Orders Placed This Encounter  Procedures   CBC with Differential (Cancer Center Only)    Standing Status:   Future    Expiration Date:   07/27/2024   Vitamin B12    Standing Status:   Future    Expiration Date:   07/27/2024   Ferritin    Standing Status:   Future    Expiration Date:   07/27/2024   Iron and Iron Binding Capacity ([facility] only)    Standing Status:   Future    Expiration Date:   07/27/2024     Lowanda Ruddy, MD  INTERVAL HISTORY: overall feeling better. No new symptoms. No GIB. Menstrual bleeding heavier but manageable.   Relevant data reviewed during this visit included labs. New labs ordered.

## 2023-07-28 NOTE — Assessment & Plan Note (Signed)
 Repeat b12 and iron studies next time.

## 2023-07-28 NOTE — Assessment & Plan Note (Addendum)
 Continue apixaban 5 mg twice daily

## 2023-07-28 NOTE — Assessment & Plan Note (Addendum)
 On sublingual vitamin B12. May continue

## 2023-07-28 NOTE — Assessment & Plan Note (Addendum)
 Heavy periods.  Increase oral iron to two tabs of 26 mg daily Repeat iron lab next visit.

## 2023-08-01 ENCOUNTER — Other Ambulatory Visit: Payer: Self-pay

## 2023-08-01 DIAGNOSIS — I2699 Other pulmonary embolism without acute cor pulmonale: Secondary | ICD-10-CM

## 2023-08-07 ENCOUNTER — Telehealth: Payer: Self-pay | Admitting: Internal Medicine

## 2023-08-07 NOTE — Telephone Encounter (Signed)
 Spoke with the pt and was able to inform her that we did receive her paperwork and it has been filled. Once Provider returns signed paperwork I will be faxing them.   Paperwork time frame is 7-10 business days.

## 2023-08-07 NOTE — Telephone Encounter (Signed)
 Copied from CRM [id number]. Topic: General - Other >> Aug 07, 2023  8:28 AM Zipporah Him wrote: Reason for CRM: Requesting a call from Erby Hatcher in the office so she can speak to her regarding FMLA paperwork. Requests call ASAP today.

## 2023-08-07 NOTE — Telephone Encounter (Signed)
 Copied from CRM [id number]. Topic: General - Other >> Aug 07, 2023  8:28 AM Zipporah Him wrote: Reason for CRM: Requesting a call from Erby Hatcher in the office so she can speak to her regarding FMLA paperwork. Requests call ASAP today. >> Aug 07, 2023 11:35 AM Marlan Silva wrote: Patient called again wanting to leave a voicemail for [facility]. I informed patient that she would not be able to leave a voicemail but I can assure her that the message was sent and Erby Hatcher will be returning her call before end of business day today being that she called well before 1pm.

## 2023-08-21 ENCOUNTER — Ambulatory Visit (INDEPENDENT_AMBULATORY_CARE_PROVIDER_SITE_OTHER): Admitting: Plastic Surgery

## 2023-08-21 ENCOUNTER — Encounter: Payer: Self-pay | Admitting: Plastic Surgery

## 2023-08-21 VITALS — BP 129/89 | HR 80 | Ht 64.0 in | Wt 281.6 lb

## 2023-08-21 DIAGNOSIS — Z6841 Body Mass Index (BMI) 40.0 and over, adult: Secondary | ICD-10-CM

## 2023-08-21 DIAGNOSIS — M793 Panniculitis, unspecified: Secondary | ICD-10-CM | POA: Diagnosis not present

## 2023-08-21 DIAGNOSIS — E65 Localized adiposity: Secondary | ICD-10-CM | POA: Diagnosis not present

## 2023-08-21 NOTE — Progress Notes (Signed)
 Referring Provider Plotnikov, Oakley Bellman, MD [address] [place],  [place] [address]   CC:  Chief Complaint  Patient presents with   Advice Only      Bridget [name] is an 48 y.o. female.  HPI: Bridget [name] is a 48 year old female who presents today with complaints of excess skin and fat on the anterior abdominal wall.  She notes that this excess tissue often becomes inflamed and painful especially when she sweats.  Additionally she states that she has scoliosis and she believes that the excess weight from skin and fat is contributing to her lower back pain she would like to have this tissue removed.  Of note the patient is on Eliquis  and has a history of PEs.  She is being managed by her hematology oncologist for this.  Allergies  Allergen Reactions   Meloxicam  Other (See Comments)    Unknown. No NSAIDS with Current Meds.    Outpatient Encounter Medications as of 08/21/2023  Medication Sig Note   acetaminophen  (TYLENOL ) 500 MG tablet Take 1,000 mg by mouth every 6 (six) hours as needed for mild pain.    albuterol  (VENTOLIN  HFA) 108 (90 Base) MCG/ACT inhaler Inhale 1-2 puffs into the lungs every 6 (six) hours as needed for wheezing or shortness of breath.    cetirizine (ZYRTEC) 10 MG tablet Take 10 mg by mouth daily as needed for allergies.    Cholecalciferol  (VITAMIN D3) 125 MCG (5000 UT) CAPS Take 1 capsule by mouth daily.    Cyanocobalamin  (VITAMIN B-12) 1000 MCG SUBL Place 1 tablet (1,000 mcg total) under the tongue daily.    ELIQUIS  5 MG TABS tablet Take 1 tablet by mouth twice daily    lidocaine  (LIDODERM ) 5 % Place 1 patch onto the skin daily as needed. Remove & Discard patch within 12 hours or as directed by MD 08/21/2023: As needed   Multiple Vitamin (MULTIVITAMIN) capsule Take 1 capsule by mouth daily.    pantoprazole  (PROTONIX ) 40 MG tablet Take 1 tablet (40 mg total) by mouth daily.    potassium chloride  SA (KLOR-CON  M) 20 MEQ tablet Take 1 tablet (20 mEq total) by mouth 3  (three) times daily.    rizatriptan  (MAXALT ) 10 MG tablet Take 1 tablet (10 mg total) by mouth daily as needed for migraine. May repeat in 2 hours if needed    tirzepatide (MOUNJARO) 10 MG/0.5ML Pen Inject 10 mg into the skin once a week. On a COMPOUND Rx    Tirzepatide-Weight Management (ZEPBOUND [place])     triamterene -hydrochlorothiazide (MAXZIDE-25) 37.5-25 MG tablet Take 1 tablet by mouth daily.    clonazePAM  (KLONOPIN ) 0.5 MG disintegrating tablet Take 1-2 tablets (0.5-1 mg total) by mouth 2 (two) times daily as needed (panic attacks). (Patient not taking: Reported on 08/21/2023)    cyclobenzaprine  (FLEXERIL ) 10 MG tablet Take 1 tablet (10 mg total) by mouth 2 (two) times daily as needed for muscle spasms. (Patient not taking: Reported on 08/21/2023)    furosemide  (LASIX ) 20 MG tablet TAKE 1 TO 2 TABLETS BY MOUTH ONCE DAILY AS NEEDED (Patient not taking: Reported on 06/02/2023)    meloxicam  (MOBIC ) 15 MG tablet Take 1 tablet (15 mg total) by mouth daily. (Patient not taking: Reported on 06/02/2023)    oxyCODONE  (ROXICODONE ) 5 MG immediate release tablet Take 1 tablet (5 mg total) by mouth every 6 (six) hours as needed. (Patient not taking: Reported on 08/21/2023)    traMADol  (ULTRAM ) 50 MG tablet Take 1 tablet by mouth  every 6 (six) hours as needed. (Patient not taking: Reported on 08/21/2023)    No facility-administered encounter medications on file as of 08/21/2023.     Past Medical History:  Diagnosis Date   Constipation    Edema    Headache(784.0)    Morbid obesity (HCC)    Pulmonary embolism (HCC)     Past Surgical History:  Procedure Laterality Date   BREAST REDUCTION SURGERY     BREAST SURGERY  2005   reduction   CESAREAN SECTION N/A 03/20/2016   Procedure: CESAREAN SECTION;  Surgeon: Arlee Lace, MD;  Location: [facility];  Service: Obstetrics;  Laterality: N/A;   COLONOSCOPY WITH PROPOFOL  N/A 05/11/2020   Procedure: COLONOSCOPY WITH PROPOFOL ;  Surgeon: Janel Medford, MD;   Location: [facility];  Service: Endoscopy;  Laterality: N/A;   POLYPECTOMY  05/11/2020   Procedure: POLYPECTOMY;  Surgeon: Janel Medford, MD;  Location: [facility];  Service: Endoscopy;;   TUBAL LIGATION Bilateral 03/20/2016   Procedure: BILATERAL TUBAL LIGATION;  Surgeon: Arlee Lace, MD;  Location: [facility] BIRTHING SUIT[facility];  Service: Obstetrics;  Laterality: Bilateral;    Family History  Problem Relation Age of Onset   Deep vein thrombosis Father    Diabetes Father    Hypertension Mother    Hypertension Other    Diabetes Paternal Aunt    Heart disease Maternal Grandmother    Heart disease Maternal Grandfather    Diabetes Paternal Grandmother     Social History   Social History Narrative   Not on file     Review of Systems General: Denies fevers, chills, weight loss CV: Denies chest pain, shortness of breath, palpitations Abdomen: Excess skin and fat which the patient states is often inflamed and irritated  Physical Exam    08/21/2023   10:57 AM 07/21/2023    4:18 PM 06/02/2023    2:52 PM  Vitals with BMI  Height 5\' 4"  5\' 4"    Weight 281 lbs 10 oz 281 lbs 292 lbs 11 oz  BMI 48.31 48.21 50.22  Systolic 129  129  Diastolic 89  65  Pulse 80  76    General:  No acute distress,  Alert and oriented, Non-Toxic, Normal speech and affect Abdomen: Patient not examined on this visit Mammogram: BI-RADS 2 in January 2023 Assessment/Plan Panas: Patient has a large pannus that she would like to have removed.  She is currently at a BMI of 48.  We had a long discussion regarding complications with panniculectomy.  I told her that panniculectomy is often have a very high rate of complications exceeding 50%.  These are usually wound breakdown or small wound infections and are easily managed however as BMI increases the rate of complications also increases.  Additionally high BMI is a risk factor for.  She already has a history of PEs and I believe that she would be at an extraordinarily high  risk for additional complications.  I have asked her to continue working on weight loss.  She is on a medical weight loss program currently.  Once her weight is under a BMI of 40 we can reevaluate and discuss the risks with her hematology oncologist.  She does understand that if she needs to be on anticoagulation through surgery that I would strongly recommend against surgical therapy we did have a general discussion regarding weight loss strategies including the importance of resistance training.  I told her that additional resistance training for her core may very well help her back.  We also discussed the importance of a healthy diet which includes proteins and vegetables and a limited amount of fruit avoiding highly processed foods and simple carbohydrates.  She will return when her weight is closer to  Teretha Ferguson 08/21/2023, 12:36 PM

## 2023-08-25 ENCOUNTER — Telehealth: Payer: Self-pay | Admitting: Internal Medicine

## 2023-08-25 NOTE — Telephone Encounter (Unsigned)
 Copied from CRM [id number]. Topic: General - Other >> Aug 25, 2023  2:19 PM Clyde Darling P wrote: Reason for CRM: Pt would like nurse to give a call regarding FMLA [phone]

## 2023-08-27 ENCOUNTER — Encounter: Payer: Self-pay | Admitting: Internal Medicine

## 2023-08-27 NOTE — Telephone Encounter (Signed)
 Patient called again and would like Shelagh Derrick to give her a call today.

## 2023-08-28 ENCOUNTER — Telehealth: Payer: Self-pay

## 2023-08-28 NOTE — Telephone Encounter (Signed)
 I have spoken with the pt and this was taken care of and the pt was notified.

## 2023-08-28 NOTE — Telephone Encounter (Signed)
 Copied from CRM #[id number]. Topic: General - Other >> Aug 27, 2023  3:51 PM Trula Gable C wrote: Reason for CRM: Patient called in regarding FMLA stated that today is the last day not tomorrow, stated she would like for that to be submitted by the end of the day today! It has to be faxed today. She would also like for Shelagh Derrick to give her a call as soon as it has been faxed so she knows that it has been submitted.

## 2023-09-04 ENCOUNTER — Ambulatory Visit: Admitting: Internal Medicine

## 2023-09-08 ENCOUNTER — Ambulatory Visit: Admitting: Internal Medicine

## 2023-09-18 ENCOUNTER — Encounter (HOSPITAL_BASED_OUTPATIENT_CLINIC_OR_DEPARTMENT_OTHER): Payer: Self-pay

## 2023-09-18 ENCOUNTER — Other Ambulatory Visit: Payer: Self-pay

## 2023-09-18 ENCOUNTER — Emergency Department (HOSPITAL_BASED_OUTPATIENT_CLINIC_OR_DEPARTMENT_OTHER)

## 2023-09-18 ENCOUNTER — Emergency Department (HOSPITAL_BASED_OUTPATIENT_CLINIC_OR_DEPARTMENT_OTHER)
Admission: EM | Admit: 2023-09-18 | Discharge: 2023-09-19 | Disposition: A | Discharge status: Home / Self Care | Attending: Emergency Medicine | Admitting: Emergency Medicine

## 2023-09-18 DIAGNOSIS — Z794 Long term (current) use of insulin: Secondary | ICD-10-CM | POA: Diagnosis not present

## 2023-09-18 DIAGNOSIS — R109 Unspecified abdominal pain: Secondary | ICD-10-CM | POA: Insufficient documentation

## 2023-09-18 DIAGNOSIS — Z7901 Long term (current) use of anticoagulants: Secondary | ICD-10-CM | POA: Insufficient documentation

## 2023-09-18 DIAGNOSIS — R531 Weakness: Secondary | ICD-10-CM | POA: Diagnosis not present

## 2023-09-18 DIAGNOSIS — R0600 Dyspnea, unspecified: Secondary | ICD-10-CM | POA: Insufficient documentation

## 2023-09-18 DIAGNOSIS — R0602 Shortness of breath: Secondary | ICD-10-CM | POA: Diagnosis not present

## 2023-09-18 DIAGNOSIS — R0789 Other chest pain: Secondary | ICD-10-CM | POA: Insufficient documentation

## 2023-09-18 DIAGNOSIS — Z86711 Personal history of pulmonary embolism: Secondary | ICD-10-CM | POA: Diagnosis not present

## 2023-09-18 DIAGNOSIS — R519 Headache, unspecified: Secondary | ICD-10-CM | POA: Insufficient documentation

## 2023-09-18 LAB — TROPONIN T, HIGH SENSITIVITY: Troponin T High Sensitivity: <15 ng/L (ref <19)

## 2023-09-18 LAB — CBC WITH DIFFERENTIAL/PLATELET
Abs Immature Granulocytes: 0.02 10*3/uL (ref 0.00–0.07)
Basophils Absolute: 0.1 10*3/uL (ref 0.0–0.1)
Basophils Relative: 1 %
Eosinophils Absolute: 0.3 10*3/uL (ref 0.0–0.5)
Eosinophils Relative: 4 %
HCT: 40.3 % (ref 36.0–46.0)
Hemoglobin: 13.5 g/dL (ref 12.0–15.0)
Immature Granulocytes: 0 %
Lymphocytes Relative: 30 %
Lymphs Abs: 2.3 10*3/uL (ref 0.7–4.0)
MCH: 31.7 pg (ref 26.0–34.0)
MCHC: 33.5 g/dL (ref 30.0–36.0)
MCV: 94.6 fL (ref 80.0–100.0)
Monocytes Absolute: 0.6 10*3/uL (ref 0.1–1.0)
Monocytes Relative: 8 %
Neutro Abs: 4.5 10*3/uL (ref 1.7–7.7)
Neutrophils Relative %: 57 %
Platelets: 285 10*3/uL (ref 150–400)
RBC: 4.26 MIL/uL (ref 3.87–5.11)
RDW: 12.2 % (ref 11.5–15.5)
WBC: 7.7 10*3/uL (ref 4.0–10.5)
nRBC: 0 % (ref 0.0–0.2)

## 2023-09-18 LAB — COMPREHENSIVE METABOLIC PANEL WITH GFR
ALT: 8 U/L (ref 0–44)
AST: 15 U/L (ref 15–41)
Albumin: 3.3 g/dL — ABNORMAL LOW (ref 3.5–5.0)
Alkaline Phosphatase: 38 U/L (ref 38–126)
Anion gap: 12 (ref 5–15)
BUN: 8 mg/dL (ref 6–20)
CO2: 21 mmol/L — ABNORMAL LOW (ref 22–32)
Calcium: 7.7 mg/dL — ABNORMAL LOW (ref 8.9–10.3)
Chloride: 111 mmol/L (ref 98–111)
Creatinine, Ser: 0.48 mg/dL (ref 0.44–1.00)
GFR, Estimated: >60 mL/min (ref >60)
Glucose, Bld: 74 mg/dL (ref 70–99)
Potassium: 3.4 mmol/L — ABNORMAL LOW (ref 3.5–5.1)
Sodium: 145 mmol/L (ref 135–145)
Total Bilirubin: 0.7 mg/dL (ref 0.0–1.2)
Total Protein: 5.3 g/dL — ABNORMAL LOW (ref 6.5–8.1)

## 2023-09-18 LAB — PRO BRAIN NATRIURETIC PEPTIDE: Pro Brain Natriuretic Peptide: <36 pg/mL (ref <300.0)

## 2023-09-18 NOTE — ED Provider Notes (Signed)
 [facility] Provider Note   CSN: [id number] Arrival date & time: 09/18/23  2024     Patient presents with: Shortness of Breath   Bridget [name] is a 48 y.o. female.   Patient with a history of pulmonary embolism on Eliquis , obesity, migraine headaches here with generalized weakness and shortness of breath.  States she is felt short of breath for the past 3 days that is constant, not worse with exertion.  Also associated with constant chest tightness that does not radiate.  Chest tightness and shortness of breath are constant and not worse with exertion.  No cough or fever.  No recent fall or injury.  Associate with a gradual onset headache that waxes and wanes in severity similar to previous migraines.  No thunderclap onset.  States her menses have been irregular and she is wondering if her iron is low again.  She is not currently on her menses however.  No missed doses of Eliquis .  No focal weakness, numbness or tingling.  No abdominal pain, fever or vomiting.  Also feels at times that she has having difficulty getting her words out feels like oxygen  is not getting to her brain.  The history is provided by the patient.  Shortness of Breath Associated symptoms: chest pain and headaches   Associated symptoms: no abdominal pain, no cough, no fever, no rash and no vomiting        Prior to Admission medications   Medication Sig Start Date End Date Taking? Authorizing Provider  acetaminophen  (TYLENOL ) 500 MG tablet Take 1,000 mg by mouth every 6 (six) hours as needed for mild pain.    [provider]  albuterol  (VENTOLIN  HFA) 108 (90 Base) MCG/ACT inhaler Inhale 1-2 puffs into the lungs every 6 (six) hours as needed for wheezing or shortness of breath. 06/21/20   Jason Leita Repine, FNP  cetirizine (ZYRTEC) 10 MG tablet Take 10 mg by mouth daily as needed for allergies.    [provider]  Cholecalciferol  (VITAMIN D3) 125  MCG (5000 UT) CAPS Take 1 capsule by mouth daily.    [provider]  clonazePAM  (KLONOPIN ) 0.5 MG disintegrating tablet Take 1-2 tablets (0.5-1 mg total) by mouth 2 (two) times daily as needed (panic attacks). Patient not taking: Reported on 08/21/2023 09/29/19   Plotnikov, Karlynn GAILS, MD  Cyanocobalamin  (VITAMIN B-12) 1000 MCG SUBL Place 1 tablet (1,000 mcg total) under the tongue daily. 08/04/19   Plotnikov, Aleksei V, MD  cyclobenzaprine  (FLEXERIL ) 10 MG tablet Take 1 tablet (10 mg total) by mouth 2 (two) times daily as needed for muscle spasms. Patient not taking: Reported on 08/21/2023 11/30/21   Tegeler, Lonni PARAS, MD  ELIQUIS  5 MG TABS tablet Take 1 tablet by mouth twice daily 08/01/23   [name] Pauletta BROCKS, MD  furosemide  (LASIX ) 20 MG tablet TAKE 1 TO 2 TABLETS BY MOUTH ONCE DAILY AS NEEDED Patient not taking: Reported on 06/02/2023    [provider]  lidocaine  (LIDODERM ) 5 % Place 1 patch onto the skin daily as needed. Remove & Discard patch within 12 hours or as directed by MD 05/05/23   Elnor Savant A, DO  meloxicam  (MOBIC ) 15 MG tablet Take 1 tablet (15 mg total) by mouth daily. Patient not taking: Reported on 06/02/2023 02/19/23   Plotnikov, Aleksei V, MD  Multiple Vitamin (MULTIVITAMIN) capsule Take 1 capsule by mouth daily.    [provider]  oxyCODONE  (ROXICODONE ) 5 MG immediate release tablet  Take 1 tablet (5 mg total) by mouth every 6 (six) hours as needed. Patient not taking: Reported on 08/21/2023 05/05/23   Elnor Savant A, DO  pantoprazole  (PROTONIX ) 40 MG tablet Take 1 tablet (40 mg total) by mouth daily. 11/06/20   Plotnikov, Aleksei V, MD  potassium chloride  SA (KLOR-CON  M) 20 MEQ tablet Take 1 tablet (20 mEq total) by mouth 3 (three) times daily. 07/23/23   Plotnikov, Karlynn GAILS, MD  rizatriptan  (MAXALT ) 10 MG tablet Take 1 tablet (10 mg total) by mouth daily as needed for migraine. May repeat in 2 hours if needed 02/19/23   Plotnikov, Aleksei V, MD  tirzepatide   (MOUNJARO ) 10 MG/0.5ML Pen Inject 10 mg into the skin once a week. On a COMPOUND Rx 07/21/23   Plotnikov, Karlynn GAILS, MD  Tirzepatide -Weight Management (ZEPBOUND  [place])     [provider]  traMADol  (ULTRAM ) 50 MG tablet Take 1 tablet by mouth every 6 (six) hours as needed. Patient not taking: Reported on 08/21/2023    [provider]  triamterene -hydrochlorothiazide (MAXZIDE-25) 37.5-25 MG tablet Take 1 tablet by mouth daily. 01/08/23   [provider]    Allergies: Meloxicam     Review of Systems  Constitutional:  Positive for fatigue. Negative for activity change, appetite change and fever.  HENT:  Negative for congestion.   Respiratory:  Positive for chest tightness and shortness of breath. Negative for cough.   Cardiovascular:  Positive for chest pain.  Gastrointestinal:  Negative for abdominal pain, nausea and vomiting.  Genitourinary:  Negative for dysuria and hematuria.  Musculoskeletal:  Negative for arthralgias and myalgias.  Skin:  Negative for rash.  Neurological:  Positive for headaches. Negative for weakness.   all other systems are negative except as noted in the HPI and PMH.    Updated Vital Signs BP (!) 127/91 (BP Location: Left Arm)   Pulse 85   Resp 15   Ht 5' 4 (1.626 m)   Wt 124.3 kg   LMP 08/30/2023 (Exact Date)   SpO2 100%   BMI 47.03 kg/m   Physical Exam Vitals and nursing note reviewed.  Constitutional:      General: She is not in acute distress.    Appearance: She is well-developed.  HENT:     Head: Normocephalic and atraumatic.     Left Ear: Tympanic membrane normal.     Ears:     Comments: Right TM is dull and opacified.    Mouth/Throat:     Pharynx: No oropharyngeal exudate.  Eyes:     Conjunctiva/sclera: Conjunctivae normal.     Pupils: Pupils are equal, round, and reactive to light.  Neck:     Comments: No meningismus. Cardiovascular:     Rate and Rhythm: Normal rate and regular rhythm.     Heart sounds: Normal  heart sounds. No murmur heard. Pulmonary:     Effort: Pulmonary effort is normal. No respiratory distress.     Breath sounds: Normal breath sounds.  Abdominal:     Palpations: Abdomen is soft.     Tenderness: There is no abdominal tenderness. There is no guarding or rebound.  Musculoskeletal:        General: No tenderness. Normal range of motion.     Cervical back: Normal range of motion and neck supple.  Skin:    General: Skin is warm.  Neurological:     Mental Status: She is alert and oriented to person, place, and time.     Cranial Nerves: No cranial  nerve deficit.     Motor: No abnormal muscle tone.     Coordination: Coordination normal.     Comments:  5/5 strength throughout. CN 2-12 intact.Equal grip strength.   Psychiatric:        Behavior: Behavior normal.     (all labs ordered are listed, but only abnormal results are displayed) Labs Reviewed  COMPREHENSIVE METABOLIC PANEL WITH GFR - Abnormal; Notable for the following components:      Result Value   Potassium 3.4 (*)    CO2 21 (*)    Calcium 7.7 (*)    Total Protein 5.3 (*)    Albumin 3.3 (*)    All other components within normal limits  CBC WITH DIFFERENTIAL/PLATELET  HCG, QUANTITATIVE, PREGNANCY  PRO BRAIN NATRIURETIC PEPTIDE  TSH  CBG MONITORING, ED  TROPONIN T, HIGH SENSITIVITY  TROPONIN T, HIGH SENSITIVITY    EKG: EKG Interpretation Date/Time:  Friday September 19 2023 00:00:50 EDT Ventricular Rate:  75 PR Interval:  185 QRS Duration:  88 QT Interval:  386 QTC Calculation: 432 R Axis:   27  Text Interpretation: Sinus rhythm Posterior infarct, old No significant change was found Confirmed by Carita Senior [phone]) on 09/19/2023 12:06:54 AM  Radiology: CT ABDOMEN PELVIS W CONTRAST Result Date: 09/19/2023 CLINICAL DATA:  Shortness of breath and dizziness for 3 days. EXAM: CT ABDOMEN AND PELVIS WITH CONTRAST TECHNIQUE: Multidetector CT imaging of the abdomen and pelvis was performed using the standard  protocol following bolus administration of intravenous contrast. RADIATION DOSE REDUCTION: This exam was performed according to the departmental dose-optimization program which includes automated exposure control, adjustment of the mA and/or kV according to patient size and/or use of iterative reconstruction technique. CONTRAST:  OMNIPAQUE  IOHEXOL  350 MG/ML SOLN COMPARISON:  November 02, 2020 FINDINGS: Lower chest: No acute abnormality. Hepatobiliary: There is diffuse fatty infiltration of the liver parenchyma. No focal liver abnormality is seen. No gallstones, gallbladder wall thickening, or biliary dilatation. Pancreas: Unremarkable. No pancreatic ductal dilatation or surrounding inflammatory changes. Spleen: Normal in size without focal abnormality. Adrenals/Urinary Tract: Adrenal glands are unremarkable. Kidneys are normal in size, without renal calculi or hydronephrosis. A 1.8 cm simple cyst is seen within the anterior aspect of the mid to upper right kidney. Bladder is unremarkable. Stomach/Bowel: Multiple surgical clips are seen along the gastric region. Appendix appears normal. No evidence of bowel wall thickening, distention, or inflammatory changes. Numerous noninflamed diverticula are seen throughout the large bowel. Vascular/Lymphatic: No significant vascular findings are present. No enlarged abdominal or pelvic lymph nodes. Reproductive: Uterus and bilateral adnexa are unremarkable. Other: No abdominal wall hernia or abnormality. No abdominopelvic ascites. Musculoskeletal: No acute or significant osseous findings. IMPRESSION: 1. Hepatic steatosis. 2. Colonic diverticulosis. Electronically Signed   By: Suzen Dials M.D.   On: 09/19/2023 01:52   CT Angio Chest PE W and/or Wo Contrast Result Date: 09/19/2023 CLINICAL DATA:  Shortness of breath and dizziness. EXAM: CT ANGIOGRAPHY CHEST WITH CONTRAST TECHNIQUE: Multidetector CT imaging of the chest was performed using the standard protocol during  bolus administration of intravenous contrast. Multiplanar CT image reconstructions and MIPs were obtained to evaluate the vascular anatomy. RADIATION DOSE REDUCTION: This exam was performed according to the departmental dose-optimization program which includes automated exposure control, adjustment of the mA and/or kV according to patient size and/or use of iterative reconstruction technique. CONTRAST:  OMNIPAQUE  IOHEXOL  350 MG/ML SOLN COMPARISON:  May 28, 2023 FINDINGS: Cardiovascular: Satisfactory opacification of the pulmonary arteries to the segmental  level. No evidence of pulmonary embolism. Normal heart size. No pericardial effusion. Mediastinum/Nodes: No enlarged mediastinal, hilar, or axillary lymph nodes. Thyroid  gland, trachea, and esophagus demonstrate no significant findings. Lungs/Pleura: Lungs are clear. No pleural effusion or pneumothorax. Upper Abdomen: Surgical clips are seen along the gastric region. Musculoskeletal: There is moderate severity dextroscoliosis of the mid and lower thoracic spine. No acute osseous abnormalities are identified. Review of the MIP images confirms the above findings. IMPRESSION: 1. No evidence of pulmonary embolism or other acute intrathoracic process. Electronically Signed   By: Suzen Dials M.D.   On: 09/19/2023 01:47   CT Head Wo Contrast Result Date: 09/19/2023 CLINICAL DATA:  Headache, sudden, severe EXAM: CT HEAD WITHOUT CONTRAST TECHNIQUE: Contiguous axial images were obtained from the base of the skull through the vertex without intravenous contrast. RADIATION DOSE REDUCTION: This exam was performed according to the departmental dose-optimization program which includes automated exposure control, adjustment of the mA and/or kV according to patient size and/or use of iterative reconstruction technique. COMPARISON:  None Available. FINDINGS: Brain: No evidence of acute infarction, hemorrhage, hydrocephalus, extra-axial collection or mass lesion/mass  effect. Partially empty sella. Vascular: No hyperdense vessel. Skull: No acute fracture. Sinuses/Orbits: Clear sinuses.  No acute orbital findings. Other: No mastoid effusions. IMPRESSION: No evidence of acute intracranial abnormality. Electronically Signed   By: Gilmore GORMAN Molt M.D.   On: 09/19/2023 01:44   DG Chest 2 View Result Date: 09/18/2023 CLINICAL DATA:  Shortness of breath, lightheadedness and dizziness EXAM: CHEST - 2 VIEW COMPARISON:  Radiograph 05/16/2023 FINDINGS: Normal cardiomediastinal silhouette. No focal consolidation, pleural effusion, or pneumothorax. No displaced rib fractures. Unchanged scoliosis. IMPRESSION: No acute cardiopulmonary disease. Electronically Signed   By: Norman Gatlin M.D.   On: 09/18/2023 23:50     Procedures   Medications Ordered in the ED - No data to display                                  Medical Decision Making Amount and/or Complexity of Data Reviewed Labs: ordered. Decision-making details documented in ED Course. Radiology: ordered and independent interpretation performed. Decision-making details documented in ED Course. ECG/medicine tests: ordered and independent interpretation performed. Decision-making details documented in ED Course.  Risk Prescription drug management.   Shortness of breath with chest tightness, no missed doses of Eliquis .  Stable vitals.  No distress.  Lungs are clear.  EKG is normal sinus rhythm without acute ST changes.  No hypoxia.  Hemoglobin is stable.  EKG sinus rhythm without acute ST changes.  Troponin negative.  BNP minimally elevated.  Chest x-ray negative without edema or pneumonia.  Results reviewed and interpreted by me  Subjective shortness of breath.  She is concerned she could have recurrent blood clot but reports no changes with her Eliquis .  She is not hypoxic and has no increased work of breathing.  Chest x-ray is negative.  EKG is sinus rhythm without acute ST changes. Constant chest tightness  as well.  Troponins remain negative.  Low concern for ACS.  Able to ambulate without desaturation.  CT PE study negative for PE.  CT head is negative for hemorrhage. Her neurological exam is nonfocal with low suspicion for TIA or CVA.  Troponin negative x 2 with low suspicion for ACS.  No evidence of recurrent PE.  Her blood counts are stable without significant anemia. Will add on TSH.  She is able to ambulate without  desaturation.  Appears stable for outpatient follow-up with her PCP.  Return to the ED with exertional chest pain, pain associate with shortness of breath, nausea, vomiting, sweating or other concerns     Final diagnoses:  Dyspnea, unspecified type    ED Discharge Orders     None          Calab Sachse, Garnette, MD 09/19/23 [phone]

## 2023-09-18 NOTE — ED Notes (Signed)
 Attempted to get blood work - unsuccessful

## 2023-09-18 NOTE — ED Notes (Signed)
 Lab needs lite green top tube. Nurse notified.

## 2023-09-18 NOTE — ED Triage Notes (Signed)
 Pt reports increased SHOB last 3 days lightheaded and dizzy Pt has hx of pulmonary emboli in rt. Lung, currently on Eliquis 

## 2023-09-19 ENCOUNTER — Encounter (HOSPITAL_BASED_OUTPATIENT_CLINIC_OR_DEPARTMENT_OTHER): Payer: Self-pay

## 2023-09-19 ENCOUNTER — Emergency Department (HOSPITAL_BASED_OUTPATIENT_CLINIC_OR_DEPARTMENT_OTHER)

## 2023-09-19 LAB — HCG, QUANTITATIVE, PREGNANCY: hCG, Beta Chain, Quant, S: <1 m[IU]/mL (ref <5)

## 2023-09-19 LAB — TROPONIN T, HIGH SENSITIVITY: Troponin T High Sensitivity: <15 ng/L (ref <19)

## 2023-09-19 MED ORDER — METOCLOPRAMIDE HCL 5 MG/ML IJ SOLN
10.0000 mg | Freq: Once | INTRAMUSCULAR | Status: AC
  Administered 2023-09-19: 10 mg via INTRAVENOUS
  Filled 2023-09-19: qty 2

## 2023-09-19 MED ORDER — AMOXICILLIN 500 MG PO CAPS: 500.0000 mg | ORAL_CAPSULE | Freq: Three times a day (TID) | ORAL | 0 refills | Status: DC

## 2023-09-19 MED ORDER — IOHEXOL 350 MG/ML SOLN
100.0000 mL | Freq: Once | INTRAVENOUS | Status: AC | PRN
Start: 2023-09-19 — End: 2023-09-19
  Administered 2023-09-19: 100 mL via INTRAVENOUS

## 2023-09-19 MED ORDER — DIPHENHYDRAMINE HCL 50 MG/ML IJ SOLN
12.5000 mg | Freq: Once | INTRAMUSCULAR | Status: AC
  Administered 2023-09-19: 12.5 mg via INTRAVENOUS
  Filled 2023-09-19: qty 1

## 2023-09-19 NOTE — Discharge Instructions (Addendum)
 There is no Evidence of heart attack or blood clot in the lung.  Take your medications as prescribed.  Your thyroid  test is pending and you can check that online later today.  Return to the ED with exertional chest pain, pain associate with shortness of breath, nausea, vomiting, sweating or other concerns.

## 2023-09-19 NOTE — ED Notes (Signed)
 Patient ambulated without any difficulty. Pre vitals SAT 98%, HR 89. Patient maintained a steady gait without difficulty. Post vitals SAT 98%, HR 90. Patient RR 14-18. Patient returned to room tolerated well.

## 2023-09-22 ENCOUNTER — Telehealth: Payer: Self-pay | Admitting: *Deleted

## 2023-09-22 NOTE — Telephone Encounter (Signed)
 Bridget [name] requesting an appt with Dr [name]. States she is having trouble with her breathing. Dr [name] reviewed information from 7/3 ED visit. Scheduled her to be seen at the next available appt 7/28. Pt needs to follow up with PCP and if experiencing further difficulties, go to ED.

## 2023-10-07 ENCOUNTER — Inpatient Hospital Stay: Admitting: Internal Medicine

## 2023-10-09 ENCOUNTER — Telehealth: Admitting: Internal Medicine

## 2023-10-09 ENCOUNTER — Encounter: Payer: Self-pay | Admitting: Internal Medicine

## 2023-10-09 DIAGNOSIS — E876 Hypokalemia: Secondary | ICD-10-CM

## 2023-10-09 DIAGNOSIS — H8110 Benign paroxysmal vertigo, unspecified ear: Secondary | ICD-10-CM | POA: Diagnosis not present

## 2023-10-09 DIAGNOSIS — F32A Depression, unspecified: Secondary | ICD-10-CM | POA: Diagnosis not present

## 2023-10-09 DIAGNOSIS — I2782 Chronic pulmonary embolism: Secondary | ICD-10-CM

## 2023-10-09 DIAGNOSIS — F419 Anxiety disorder, unspecified: Secondary | ICD-10-CM | POA: Diagnosis not present

## 2023-10-09 DIAGNOSIS — Z6841 Body Mass Index (BMI) 40.0 and over, adult: Secondary | ICD-10-CM

## 2023-10-09 DIAGNOSIS — E538 Deficiency of other specified B group vitamins: Secondary | ICD-10-CM

## 2023-10-09 DIAGNOSIS — H66005 Acute suppurative otitis media without spontaneous rupture of ear drum, recurrent, left ear: Secondary | ICD-10-CM

## 2023-10-09 MED ORDER — CEFDINIR 300 MG PO CAPS: 300.0000 mg | ORAL_CAPSULE | Freq: Two times a day (BID) | ORAL | 0 refills | Status: DC

## 2023-10-09 MED ORDER — MECLIZINE HCL 25 MG PO TABS: 25.0000 mg | ORAL_TABLET | Freq: Three times a day (TID) | ORAL | 2 refills | Status: AC | PRN

## 2023-10-09 MED ORDER — FLUCONAZOLE 150 MG PO TABS: 150.0000 mg | ORAL_TABLET | Freq: Once | ORAL | 0 refills | Status: AC

## 2023-10-09 MED ORDER — POTASSIUM CHLORIDE CRYS ER 20 MEQ PO TBCR: 40.0000 meq | EXTENDED_RELEASE_TABLET | Freq: Two times a day (BID) | ORAL | 3 refills | Status: AC

## 2023-10-09 NOTE — Assessment & Plan Note (Signed)
 Increase to 40 meq bid - works best for the pt

## 2023-10-09 NOTE — Progress Notes (Signed)
 Virtual Visit via Video Note  I connected with Bridget [name] on 10/09/23 at 11:20 AM EDT by a video enabled telemedicine application and verified that I am speaking with the correct person using two identifiers.   I discussed the limitations of evaluation and management by telemedicine and the availability of in person appointments. The patient expressed understanding and agreed to proceed.  I was located at our [facility] office. The patient was at home. There was no one else present in the visit.  No chief complaint on file.    History of Present Illness:   C/o recent dyspnea episode on 09/18/2023. C/o vertigo, low K On Mounjaro   C/o L ear pain  Per Dr Carita: Shortness of breath with chest tightness, no missed doses of Eliquis .  Stable vitals.  No distress.  Lungs are clear.   EKG is normal sinus rhythm without acute ST changes.  No hypoxia.  Hemoglobin is stable.   EKG sinus rhythm without acute ST changes.  Troponin negative.  BNP minimally elevated.  Chest x-ray negative without edema or pneumonia.  Results reviewed and interpreted by me   Subjective shortness of breath.  She is concerned she could have recurrent blood clot but reports no changes with her Eliquis .  She is not hypoxic and has no increased work of breathing.  Chest x-ray is negative.  EKG is sinus rhythm without acute ST changes. Constant chest tightness as well.  Troponins remain negative.  Low concern for ACS.  Able to ambulate without desaturation.   CT PE study negative for PE.  CT head is negative for hemorrhage. Her neurological exam is nonfocal with low suspicion for TIA or CVA.   Troponin negative x 2 with low suspicion for ACS.  No evidence of recurrent PE.  Her blood counts are stable without significant anemia. Will add on TSH.   She is able to ambulate without desaturation.  Appears stable for outpatient follow-up with her PCP.  Return to the ED with exertional chest pain, pain associate with  shortness of breath, nausea, vomiting, sweating or other concerns Review of Systems  Constitutional:  Negative for fever.  HENT:  Positive for congestion, ear pain and hearing loss. Negative for sore throat and tinnitus.   Respiratory:  Negative for shortness of breath.   Cardiovascular:  Negative for chest pain, palpitations and orthopnea.  Neurological:  Positive for dizziness. Negative for seizures and weakness.  Psychiatric/Behavioral:  Negative for memory loss. The patient is nervous/anxious.      Observations/Objective: The patient appears to be in no acute distress  Assessment and Plan:  Problem List Items Addressed This Visit     Anxiety and depression   Clonazepam  wafer bid prn panic attack - pt declined, d/c      B12 deficiency   On B12      BPPV (benign paroxysmal positional vertigo)   Treat L OM Meclizine  prn      Hypokalemia - Primary   Increase to 40 meq bid - works best for the pt      Morbid obesity with BMI of 50.0-59.9, adult (HCC)   Pt lost 50 lbs      Otitis media   Omnicef  po Diflucan  Rx prn      Relevant Medications   cefdinir  (OMNICEF ) 300 MG capsule   fluconazole  (DIFLUCAN ) 150 MG tablet   Pulmonary embolism (HCC)   On Eliquis         Meds ordered this encounter  Medications   potassium  chloride SA (KLOR-CON  M) 20 MEQ tablet    Sig: Take 2 tablets (40 mEq total) by mouth 2 (two) times daily.    Dispense:  360 tablet    Refill:  3   meclizine  (ANTIVERT ) 25 MG tablet    Sig: Take 1 tablet (25 mg total) by mouth 3 (three) times daily as needed for dizziness.    Dispense:  60 tablet    Refill:  2   cefdinir  (OMNICEF ) 300 MG capsule    Sig: Take 1 capsule (300 mg total) by mouth 2 (two) times daily.    Dispense:  20 capsule    Refill:  0   fluconazole  (DIFLUCAN ) 150 MG tablet    Sig: Take 1 tablet (150 mg total) by mouth once for 1 dose.    Dispense:  1 tablet    Refill:  0     Follow Up Instructions:    I discussed the  assessment and treatment plan with the patient. The patient was provided an opportunity to ask questions and all were answered. The patient agreed with the plan and demonstrated an understanding of the instructions.   The patient was advised to call back or seek an in-person evaluation if the symptoms worsen or if the condition fails to improve as anticipated.  I provided face-to-face time during this encounter. We were at different locations.   Marolyn Noel, MD

## 2023-10-09 NOTE — Assessment & Plan Note (Signed)
 On Eliquis

## 2023-10-09 NOTE — Assessment & Plan Note (Signed)
 Omnicef  po Diflucan  Rx prn

## 2023-10-09 NOTE — Assessment & Plan Note (Signed)
 Clonazepam  wafer bid prn panic attack - pt declined, d/c

## 2023-10-09 NOTE — Assessment & Plan Note (Signed)
 Treat L OM Meclizine  prn

## 2023-10-09 NOTE — Assessment & Plan Note (Signed)
Pt lost 50 lbs 

## 2023-10-09 NOTE — Assessment & Plan Note (Signed)
 On B12

## 2023-10-12 NOTE — Assessment & Plan Note (Deleted)
 Continue apixaban 5 mg twice daily

## 2023-10-12 NOTE — Assessment & Plan Note (Deleted)
 On B12. Will conitnue H/o bariatric surgery

## 2023-10-12 NOTE — Assessment & Plan Note (Deleted)
 Continue future follow up on ferritin, b12, MMA on next visit.

## 2023-10-12 NOTE — Progress Notes (Deleted)
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   First episode: 2020 presumed PE due to an elevated D-dimer test, and was briefly on Xarelto , subsequently had a CT PE study did not show any obvious clot and was taken off of Xarelto .  Second episode: Feb 2025 resolved on xarelto  Risk factors: personal history. Family history. BMI >30. 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  She has similar symptoms resolved with anticoagulation during the first time.  She is feeling better on Eliquis .   She also has history of low B12, History of bariatric surgery. Heavy menstrual bleeding. Continue sublingual b12 and oral iron and repeat levels of ferritin and B12 in 6 months.  Assessment & Plan Recurrent pulmonary emboli (HCC) Continue apixaban  5 mg twice daily  History of bariatric surgery Continue future follow up on ferritin, b12, MMA on next visit. B12 deficiency On B12. Will conitnue H/o bariatric surgery  No orders of the defined types were placed in this encounter.    Bridget Bridget [name], MD  INTERVAL HISTORY: Patient returns for follow-up.  Oncology History   No history exists.     PHYSICAL EXAMINATION: ECOG PERFORMANCE STATUS: {CHL ONC ECOG PS:[phone]}  There were no vitals filed for this visit. There were no vitals filed for this  visit.  Relevant data reviewed during this visit included ***

## 2023-10-13 ENCOUNTER — Inpatient Hospital Stay

## 2023-10-13 ENCOUNTER — Other Ambulatory Visit

## 2023-10-13 DIAGNOSIS — I2699 Other pulmonary embolism without acute cor pulmonale: Secondary | ICD-10-CM

## 2023-10-13 DIAGNOSIS — Z9884 Bariatric surgery status: Secondary | ICD-10-CM

## 2023-10-13 DIAGNOSIS — E538 Deficiency of other specified B group vitamins: Secondary | ICD-10-CM

## 2023-10-21 ENCOUNTER — Encounter: Payer: Self-pay | Admitting: Internal Medicine

## 2023-10-21 ENCOUNTER — Other Ambulatory Visit: Payer: Self-pay | Admitting: Internal Medicine

## 2023-10-21 ENCOUNTER — Other Ambulatory Visit: Payer: Self-pay

## 2023-10-21 DIAGNOSIS — I10 Essential (primary) hypertension: Secondary | ICD-10-CM

## 2023-10-21 MED ORDER — TRIAMTERENE-HCTZ 37.5-25 MG PO TABS: 1.0000 | ORAL_TABLET | Freq: Every day | ORAL | 3 refills | Status: AC

## 2023-10-21 NOTE — Telephone Encounter (Signed)
 Copied from CRM [id number]. Topic: Clinical - Prescription Issue >> Oct 21, 2023  2:49 PM Pinkey ORN wrote: Reason for CRM: triamterene -hydrochlorothiazide (MAXZIDE-25) 37.5-25 MG tablet >> Oct 21, 2023  2:50 PM Pinkey ORN wrote: Patient called in stating that her medication has current refills on it, but the prescription itself is expired.

## 2023-11-12 ENCOUNTER — Ambulatory Visit: Admitting: Internal Medicine

## 2023-11-13 ENCOUNTER — Emergency Department (HOSPITAL_BASED_OUTPATIENT_CLINIC_OR_DEPARTMENT_OTHER)
Admission: EM | Admit: 2023-11-13 | Discharge: 2023-11-14 | Disposition: A | Discharge status: Home / Self Care | Attending: Emergency Medicine | Admitting: Emergency Medicine

## 2023-11-13 ENCOUNTER — Emergency Department (HOSPITAL_BASED_OUTPATIENT_CLINIC_OR_DEPARTMENT_OTHER)

## 2023-11-13 ENCOUNTER — Encounter (HOSPITAL_BASED_OUTPATIENT_CLINIC_OR_DEPARTMENT_OTHER): Payer: Self-pay | Admitting: Emergency Medicine

## 2023-11-13 ENCOUNTER — Other Ambulatory Visit: Payer: Self-pay

## 2023-11-13 DIAGNOSIS — M546 Pain in thoracic spine: Secondary | ICD-10-CM | POA: Insufficient documentation

## 2023-11-13 DIAGNOSIS — Z7901 Long term (current) use of anticoagulants: Secondary | ICD-10-CM | POA: Diagnosis not present

## 2023-11-13 LAB — URINALYSIS, ROUTINE W REFLEX MICROSCOPIC
Bilirubin Urine: NEGATIVE
Glucose, UA: NEGATIVE mg/dL
Hgb urine dipstick: NEGATIVE
Ketones, ur: NEGATIVE mg/dL
Leukocytes,Ua: NEGATIVE
Nitrite: NEGATIVE
Protein, ur: NEGATIVE mg/dL
Specific Gravity, Urine: 1.025 (ref 1.005–1.030)
pH: 6.5 (ref 5.0–8.0)

## 2023-11-13 LAB — BASIC METABOLIC PANEL WITH GFR
Anion gap: 12 (ref 5–15)
BUN: 15 mg/dL (ref 6–20)
CO2: 25 mmol/L (ref 22–32)
Calcium: 9.6 mg/dL (ref 8.9–10.3)
Chloride: 104 mmol/L (ref 98–111)
Creatinine, Ser: 0.75 mg/dL (ref 0.44–1.00)
GFR, Estimated: >60 mL/min (ref >60)
Glucose, Bld: 85 mg/dL (ref 70–99)
Potassium: 3.6 mmol/L (ref 3.5–5.1)
Sodium: 141 mmol/L (ref 135–145)

## 2023-11-13 LAB — CBC
HCT: 39.6 % (ref 36.0–46.0)
Hemoglobin: 13.3 g/dL (ref 12.0–15.0)
MCH: 31.6 pg (ref 26.0–34.0)
MCHC: 33.6 g/dL (ref 30.0–36.0)
MCV: 94.1 fL (ref 80.0–100.0)
Platelets: 308 K/uL (ref 150–400)
RBC: 4.21 MIL/uL (ref 3.87–5.11)
RDW: 11.9 % (ref 11.5–15.5)
WBC: 6.8 K/uL (ref 4.0–10.5)
nRBC: 0 % (ref 0.0–0.2)

## 2023-11-13 LAB — PREGNANCY, URINE: Preg Test, Ur: NEGATIVE

## 2023-11-13 LAB — TROPONIN T, HIGH SENSITIVITY: Troponin T High Sensitivity: <15 ng/L (ref 0–19)

## 2023-11-13 MED ORDER — IOHEXOL 350 MG/ML SOLN
100.0000 mL | Freq: Once | INTRAVENOUS | Status: AC | PRN
  Administered 2023-11-13: 100 mL via INTRAVENOUS

## 2023-11-13 NOTE — Discharge Instructions (Signed)
 You were seen in the emergency department today for evaluation of your symptoms.  Your workup is unremarkable.  Your CT scans do not show any acute abnormality.  This could be possible muscular pain.  I recommend taking Tylenol  1000mg  every 6 hours as needed.  Will send you with a muscle relaxer as well.  I would like for you to follow with your primary care doctor for reevaluation on this.  It is very important that you are compliant with your Eliquis !  If you start having chest pain, worsening shortness of breath, cough, fever, belly pain, nausea, vomiting, dysuria, hematuria, please return urinary in for evaluation.  If you have any other concerns, new or worsening symptoms, please return your nearest emerged part for evaluation.  Contact a health care provider if: You have pain that is not relieved with rest or medicine. You have increasing pain going down into your legs or buttocks. Your pain does not improve after 2 weeks. You have pain at night. You lose weight without trying. You have a fever or chills. You develop nausea or vomiting. You develop abdominal pain. Get help right away if: You develop new bowel or bladder control problems. You have unusual weakness or numbness in your arms or legs. You feel faint. These symptoms may represent a serious problem that is an emergency. Do not wait to see if the symptoms will go away. Get medical help right away. Call your local emergency services (911 in the [place] Do not drive yourself to the hospital.

## 2023-11-13 NOTE — ED Triage Notes (Signed)
 Pt c/o BL flank pain yesterday, today felt ShOB while at work, felt like it was hard to take a deep breath.    C/o back tightness x 1 hour. Hx of PE.

## 2023-11-13 NOTE — ED Provider Notes (Signed)
 [place] [facility] Provider Note   CSN: [id number] Arrival date & time: 11/13/23  8158     Patient presents with: Shortness of Breath and Back Pain   Bridget [name] is a 48 y.o. female with h/o PE on Eliquis  presents to the ER today for evaluation of bilateral flank pain in the mid back since yesterday. She reports she noticed it when sitting at her desk, but was not sudden onset. She reports that she felt like she needed to take a deeper breath today, but no DOE.  She reports that she has missed 3 doses of her Eliquis  this week.  She denies any dysuria, hematuria, urgency, frequency, fever, chest pain, cough, runny nose, nasal ingestion, abdominal pain, nausea, vomiting, diarrhea, constipation.  Last BM 2 days ago.  Denies any injury to the area.   Shortness of Breath Associated symptoms: no abdominal pain, no chest pain, no cough, no fever and no vomiting   Back Pain Associated symptoms: no abdominal pain, no chest pain, no dysuria and no fever        Prior to Admission medications   Medication Sig Start Date End Date Taking? Authorizing Provider  acetaminophen  (TYLENOL ) 500 MG tablet Take 1,000 mg by mouth every 6 (six) hours as needed for mild pain.    [provider]  albuterol  (VENTOLIN  HFA) 108 (90 Base) MCG/ACT inhaler Inhale 1-2 puffs into the lungs every 6 (six) hours as needed for wheezing or shortness of breath. 06/21/20   Jason Leita Repine, FNP  amoxicillin  (AMOXIL ) 500 MG capsule Take 1 capsule (500 mg total) by mouth 3 (three) times daily. 09/19/23   Rancour, Garnette, MD  cefdinir  (OMNICEF ) 300 MG capsule Take 1 capsule (300 mg total) by mouth 2 (two) times daily. 10/09/23   Plotnikov, Aleksei V, MD  cetirizine (ZYRTEC) 10 MG tablet Take 10 mg by mouth daily as needed for allergies.    [provider]  Cholecalciferol  (VITAMIN D3) 125 MCG (5000 UT) CAPS Take 1 capsule by mouth daily.    [provider]   Cyanocobalamin  (VITAMIN B-12) 1000 MCG SUBL Place 1 tablet (1,000 mcg total) under the tongue daily. 08/04/19   Plotnikov, Karlynn GAILS, MD  cyclobenzaprine  (FLEXERIL ) 10 MG tablet Take 1 tablet (10 mg total) by mouth 2 (two) times daily as needed for muscle spasms. Patient not taking: Reported on 08/21/2023 11/30/21   Tegeler, Lonni PARAS, MD  ELIQUIS  5 MG TABS tablet Take 1 tablet by mouth twice daily 08/01/23   [name] Pauletta BROCKS, MD  furosemide  (LASIX ) 20 MG tablet TAKE 1 TO 2 TABLETS BY MOUTH ONCE DAILY AS NEEDED Patient not taking: Reported on 06/02/2023    [provider]  lidocaine  (LIDODERM ) 5 % Place 1 patch onto the skin daily as needed. Remove & Discard patch within 12 hours or as directed by MD 05/05/23   Elnor Jayson LABOR, DO  meclizine  (ANTIVERT ) 25 MG tablet Take 1 tablet (25 mg total) by mouth 3 (three) times daily as needed for dizziness. 10/09/23   Plotnikov, Aleksei V, MD  meloxicam  (MOBIC ) 15 MG tablet Take 1 tablet (15 mg total) by mouth daily. Patient not taking: Reported on 06/02/2023 02/19/23   Plotnikov, Aleksei V, MD  Multiple Vitamin (MULTIVITAMIN) capsule Take 1 capsule by mouth daily.    [provider]  oxyCODONE  (ROXICODONE ) 5 MG immediate release tablet Take 1 tablet (5 mg total) by mouth every 6 (six) hours as needed. Patient not taking:  Reported on 08/21/2023 05/05/23   Elnor Jayson LABOR, DO  pantoprazole  (PROTONIX ) 40 MG tablet Take 1 tablet (40 mg total) by mouth daily. 11/06/20   Plotnikov, Aleksei V, MD  Potassium Chloride  ER 20 MEQ TBCR Take 1 tablet by mouth 2 (two) times daily. 09/27/23   [provider]  potassium chloride  SA (KLOR-CON  M) 20 MEQ tablet Take 2 tablets (40 mEq total) by mouth 2 (two) times daily. 10/09/23   Plotnikov, Aleksei V, MD  rizatriptan  (MAXALT ) 10 MG tablet Take 1 tablet (10 mg total) by mouth daily as needed for migraine. May repeat in 2 hours if needed 02/19/23   Plotnikov, Aleksei V, MD  tirzepatide  (MOUNJARO ) 10 MG/0.5ML Pen  Inject 10 mg into the skin once a week. On a COMPOUND Rx 07/21/23   Plotnikov, Karlynn GAILS, MD  Tirzepatide -Weight Management (ZEPBOUND  [place])     [provider]  traMADol  (ULTRAM ) 50 MG tablet Take 1 tablet by mouth every 6 (six) hours as needed. Patient not taking: Reported on 08/21/2023    [provider]  triamterene -hydrochlorothiazide (MAXZIDE-25) 37.5-25 MG tablet Take 1 tablet by mouth daily. 10/21/23   Plotnikov, Karlynn GAILS, MD    Allergies: Meloxicam     Review of Systems  Constitutional:  Negative for chills and fever.  Respiratory:  Positive for shortness of breath. Negative for cough.   Cardiovascular:  Negative for chest pain and leg swelling.  Gastrointestinal:  Negative for abdominal pain, constipation, diarrhea, nausea and vomiting.  Genitourinary:  Positive for flank pain. Negative for dysuria and hematuria.  Musculoskeletal:  Positive for back pain.  See HPI  Updated Vital Signs BP 115/68   Pulse 76   Temp 98.2 F (36.8 C)   Resp 16   Ht 5' 4 (1.626 m)   Wt 123.8 kg   LMP  (LMP Unknown)   SpO2 100%   BMI 46.86 kg/m   Physical Exam Vitals and nursing note reviewed.  Constitutional:      General: She is not in acute distress.    Appearance: She is not ill-appearing or toxic-appearing.  HENT:     Mouth/Throat:     Mouth: Mucous membranes are moist.  Eyes:     General: No scleral icterus. Cardiovascular:     Rate and Rhythm: Normal rate.     Pulses: Normal pulses.          Radial pulses are 2+ on the right side and 2+ on the left side.       Dorsalis pedis pulses are 2+ on the right side and 2+ on the left side.       Posterior tibial pulses are 2+ on the right side and 2+ on the left side.  Pulmonary:     Effort: Pulmonary effort is normal. No respiratory distress.     Breath sounds: Normal breath sounds. No decreased breath sounds.  Abdominal:     Palpations: Abdomen is soft.     Tenderness: There is no abdominal tenderness. There is no  guarding or rebound.  Musculoskeletal:       Back:     Right lower leg: No edema.     Left lower leg: No edema.     Comments: Patient is experiencing paint to this area, but non tender to palpation. No overlying skin changes noted.   Skin:    General: Skin is warm and dry.  Neurological:     General: No focal deficit present.     Mental Status: She is  alert.     Motor: No weakness.     (all labs ordered are listed, but only abnormal results are displayed) Labs Reviewed  URINALYSIS, ROUTINE W REFLEX MICROSCOPIC - Abnormal; Notable for the following components:      Result Value   APPearance HAZY (*)    All other components within normal limits  BASIC METABOLIC PANEL WITH GFR  CBC  PREGNANCY, URINE  TROPONIN T, HIGH SENSITIVITY  TROPONIN T, HIGH SENSITIVITY    EKG: None Sinus rhythm Low voltage, precordial leads Abnormal R-wave progression, early transition No significant change since last tracing Confirmed by Haze Lonni PARAS   Radiology: CT Angio Chest PE W and/or Wo Contrast Result Date: 11/13/2023 CLINICAL DATA:  Chest and abdominal pain with shortness of breath, initial encounter EXAM: CT ANGIOGRAPHY CHEST CT ABDOMEN AND PELVIS WITH CONTRAST TECHNIQUE: Multidetector CT imaging of the chest was performed using the standard protocol during bolus administration of intravenous contrast. Multiplanar CT image reconstructions and MIPs were obtained to evaluate the vascular anatomy. Multidetector CT imaging of the abdomen and pelvis was performed using the standard protocol during bolus administration of intravenous contrast. RADIATION DOSE REDUCTION: This exam was performed according to the departmental dose-optimization program which includes automated exposure control, adjustment of the mA and/or kV according to patient size and/or use of iterative reconstruction technique. CONTRAST:  OMNIPAQUE  IOHEXOL  350 MG/ML SOLN COMPARISON:  09/19/2023 FINDINGS: CTA CHEST  FINDINGS Cardiovascular: Thoracic aorta is within normal limits. No aneurysmal dilatation or dissection is seen. Pulmonary artery shows a normal branching pattern bilaterally. No intraluminal filling defect to suggest pulmonary embolism is noted. The heart is not significantly enlarged. Mediastinum/Nodes: Thoracic inlet is within normal limits. No hilar or mediastinal adenopathy is noted. The esophagus is unremarkable. Lungs/Pleura: Lungs are well aerated bilaterally. No focal infiltrate or sizable effusion is seen. Musculoskeletal: Scoliosis of the thoracic spine concave to the left is again seen. Review of the MIP images confirms the above findings. CT ABDOMEN and PELVIS FINDINGS Hepatobiliary: No focal liver abnormality is seen. No gallstones, gallbladder wall thickening, or biliary dilatation. Pancreas: Unremarkable. No pancreatic ductal dilatation or surrounding inflammatory changes. Spleen: Normal in size without focal abnormality. Adrenals/Urinary Tract: Adrenal glands are unremarkable. Kidneys are well visualize within normal enhancement pattern. No obstructive changes are seen. Single right renal cyst is noted stable from the prior exam. No follow-up is recommended. No calculi or obstructive changes are seen. The bladder is partially distended. Stomach/Bowel: Scattered diverticular change of the colon is noted without evidence of diverticulitis. The appendix is within normal limits. No obstructive or inflammatory changes of the colon are noted. Small bowel is within normal limits. Postsurgical changes in the stomach consistent with prior bariatric surgery are noted. Vascular/Lymphatic: No significant vascular findings are present. No enlarged abdominal or pelvic lymph nodes. Reproductive: Uterus and bilateral adnexa are unremarkable. Other: No abdominal wall hernia or abnormality. No abdominopelvic ascites. Musculoskeletal: No acute or significant osseous findings. Review of the MIP images confirms the  above findings. IMPRESSION: CTA of the chest: No evidence of pulmonary emboli. No acute abnormality is seen. CT of the abdomen pelvis: Diverticulosis without diverticulitis. No acute abnormality noted. Electronically Signed   By: Oneil Devonshire M.D.   On: 11/13/2023 23:32   CT ABDOMEN PELVIS W CONTRAST Result Date: 11/13/2023 CLINICAL DATA:  Chest and abdominal pain with shortness of breath, initial encounter EXAM: CT ANGIOGRAPHY CHEST CT ABDOMEN AND PELVIS WITH CONTRAST TECHNIQUE: Multidetector CT imaging of the chest was performed using  the standard protocol during bolus administration of intravenous contrast. Multiplanar CT image reconstructions and MIPs were obtained to evaluate the vascular anatomy. Multidetector CT imaging of the abdomen and pelvis was performed using the standard protocol during bolus administration of intravenous contrast. RADIATION DOSE REDUCTION: This exam was performed according to the departmental dose-optimization program which includes automated exposure control, adjustment of the mA and/or kV according to patient size and/or use of iterative reconstruction technique. CONTRAST:  OMNIPAQUE  IOHEXOL  350 MG/ML SOLN COMPARISON:  09/19/2023 FINDINGS: CTA CHEST FINDINGS Cardiovascular: Thoracic aorta is within normal limits. No aneurysmal dilatation or dissection is seen. Pulmonary artery shows a normal branching pattern bilaterally. No intraluminal filling defect to suggest pulmonary embolism is noted. The heart is not significantly enlarged. Mediastinum/Nodes: Thoracic inlet is within normal limits. No hilar or mediastinal adenopathy is noted. The esophagus is unremarkable. Lungs/Pleura: Lungs are well aerated bilaterally. No focal infiltrate or sizable effusion is seen. Musculoskeletal: Scoliosis of the thoracic spine concave to the left is again seen. Review of the MIP images confirms the above findings. CT ABDOMEN and PELVIS FINDINGS Hepatobiliary: No focal liver abnormality is  seen. No gallstones, gallbladder wall thickening, or biliary dilatation. Pancreas: Unremarkable. No pancreatic ductal dilatation or surrounding inflammatory changes. Spleen: Normal in size without focal abnormality. Adrenals/Urinary Tract: Adrenal glands are unremarkable. Kidneys are well visualize within normal enhancement pattern. No obstructive changes are seen. Single right renal cyst is noted stable from the prior exam. No follow-up is recommended. No calculi or obstructive changes are seen. The bladder is partially distended. Stomach/Bowel: Scattered diverticular change of the colon is noted without evidence of diverticulitis. The appendix is within normal limits. No obstructive or inflammatory changes of the colon are noted. Small bowel is within normal limits. Postsurgical changes in the stomach consistent with prior bariatric surgery are noted. Vascular/Lymphatic: No significant vascular findings are present. No enlarged abdominal or pelvic lymph nodes. Reproductive: Uterus and bilateral adnexa are unremarkable. Other: No abdominal wall hernia or abnormality. No abdominopelvic ascites. Musculoskeletal: No acute or significant osseous findings. Review of the MIP images confirms the above findings. IMPRESSION: CTA of the chest: No evidence of pulmonary emboli. No acute abnormality is seen. CT of the abdomen pelvis: Diverticulosis without diverticulitis. No acute abnormality noted. Electronically Signed   By: Oneil Devonshire M.D.   On: 11/13/2023 23:32   DG Chest 2 View Result Date: 11/13/2023 CLINICAL DATA:  Chest tightness EXAM: CHEST - 2 VIEW COMPARISON:  CT 09/19/2023, chest x-ray 09/18/2023 FINDINGS: The heart size and mediastinal contours are within normal limits. Both lungs are clear. Scoliosis. IMPRESSION: No active cardiopulmonary disease. Electronically Signed   By: Luke Bun M.D.   On: 11/13/2023 19:11   Procedures   Medications Ordered in the ED  iohexol  (OMNIPAQUE ) 350 MG/ML injection 100  mL (100 mLs Intravenous Contrast Given 11/13/23 2315)   Medical Decision Making Amount and/or Complexity of Data Reviewed Labs: ordered. Radiology: ordered.  Risk Prescription drug management.   48 y.o. female presents to the ER for evaluation of midthoracic back pain with feeling that she cannot take a deep breath. Differential diagnosis includes but is not limited to ACS, pericarditis, myocarditis, aortic dissection, PE, pneumothorax, esophageal rupture, pneumonia, reflux/PUD, biliary disease, pancreatitis, costochondritis, anxiety. Vital signs unremarkable. Physical exam as noted above.   Patient is on Eliquis  for PEs.  She has missed 3 doses this past week.  She denies any trauma to the area.  She is neurovasc intact distally.  Does not appear  in any acute distress.  Vital signs are unremarkable.  I discussed this case with my attending and given the patient's symptom location, recommends ordering CT of the chest as well as of the abdomen.  I independently reviewed and interpreted the patient's labs.  Troponin less than 15.  Pregnancy test negative.  Urinalysis is hazy urine otherwise unremarkable.  BMP without electrolyte or LFT abnormality.  CBC without leukocytosis or anemia.  CTA of the chest: No evidence of pulmonary emboli. No acute abnormality is seen. CT of the abdomen pelvis: Diverticulosis without diverticulitis. No acute abnormality noted.  Given reassuring workup, could be musculoskeletal.  No emergent cause of patient symptoms discovered.  Less likely ACS given troponins and EKG.  Image does not show any acute cause of chest pain such as PE, dissection, pneumonia, or pneumothorax.  We had a long discussion stressing the importance of taking the Eliquis  and being adherent to that.  She is neurovascularly intact of the distal extremities.  I doubt any dissection or aneurysm as patient is very well-appearing and has normal blood pressure.  Recommended she try gentle stretching and  have also prescribed her muscle laxer as well.  She is stable for discharge home with strict return precautions.  We discussed the results of the labs/imaging. The plan is most relaxers, supportive care, follow-up. We discussed strict return precautions and red flag symptoms. The patient verbalized their understanding and agrees to the plan. The patient is stable and being discharged home in good condition.  I discussed this case with my attending physician who cosigned this note including patient's presenting symptoms, physical exam, and planned diagnostics and interventions. Attending physician stated agreement with plan or made changes to plan which were implemented.   Portions of this report may have been transcribed using voice recognition software. Every effort was made to ensure accuracy; however, inadvertent computerized transcription errors may be present.    Final diagnoses:  Acute bilateral thoracic back pain    ED Discharge Orders          Ordered    methocarbamol  (ROBAXIN ) 500 MG tablet  2 times daily PRN        11/14/23 0015               Bernis Ernst, PA-C 11/17/23 1706    Tegeler, Lonni PARAS, MD 11/18/23 [phone]

## 2023-11-14 MED ORDER — METHOCARBAMOL 500 MG PO TABS: 500.0000 mg | ORAL_TABLET | Freq: Two times a day (BID) | ORAL | 0 refills | Status: AC | PRN

## 2023-12-16 ENCOUNTER — Other Ambulatory Visit: Payer: Self-pay

## 2023-12-16 ENCOUNTER — Telehealth: Payer: Self-pay

## 2023-12-16 DIAGNOSIS — I2699 Other pulmonary embolism without acute cor pulmonale: Secondary | ICD-10-CM

## 2023-12-16 NOTE — Telephone Encounter (Signed)
 Patient called requested an appointment to discuss stopping her eliquis  and a refill on Eliquis  to get her to the appointment. Patient states she has not taken this in 2 weeks. Will discuss with Dr [name]. Arland Legions BSN RN

## 2023-12-17 ENCOUNTER — Telehealth: Payer: Self-pay

## 2023-12-17 NOTE — Telephone Encounter (Signed)
 Spoke with the patient she would like the 8 am appointment but is unable to come prior to appointment for labs would like them scheduled after her appointment. Message sent to scheduler to make these appointments and to Dr [name] to make him aware. Arland Legions BSN RN

## 2023-12-17 NOTE — Progress Notes (Unsigned)
 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   First episode: 2020 presumed PE due to an elevated D-dimer test, and was briefly on Xarelto , subsequently had a CT PE study did not show any obvious clot and was taken off of Xarelto .  Second episode: Feb 2025 resolved on xarelto  Risk factors: personal history. Family history. BMI >30. Setting: unprovoked Treatment: xarelto  switched to apixaban    We had a long discussion today.  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  1 family member died of PE in the past.  She gets nervous about recurrence.  We discussed the difference between provoked versus unprovoked VTE. This last episode of blood clot appeared to be unprovoked and this was the second time. For patients with unprovoked VTE, the risks of recurrent VTE after completing a course of anticoagulant therapy have been estimated to be 10% by 2 years and >30% by 10 years. ASH 2020.   She has a period sometimes but not all the time.  She is going to go see OB/GYN.  We discussed [place] choice study showed that no difference in prophylactic dose of apixaban  versus full dose after initial treatment.  Other studies have shown thus slightly high risk of aspirin  versus anticoagulation in terms of recurrence.  After discussion, she is comfortable switch to apixaban  2.5 mg twice daily.  We discussed signs and symptoms of DVT and PE again today.  She also has history of low B12, History of  bariatric surgery. Continue b12 and oral iron and repeat levels of ferritin and B12.  Assessment & Plan Recurrent pulmonary emboli (HCC) Will transition to apixaban  2.5 mg twice daily Prescription sent today Increase fluid intake. Frequent ambulation, avoid sedentary lifestyle Continue weight loss management as able History of sleeve gastrectomy Repeat b12 and iron studies and ferritin today Follow-up in 5 to 6 months  Orders Placed This Encounter  Procedures   CBC with Differential (Cancer Center Only)    Standing Status:   Future    Expiration Date:   12/17/2024   Ferritin    Standing Status:   Future    Expiration Date:   12/17/2024   Vitamin B12    Standing Status:   Future    Expiration Date:   12/17/2024   Iron and Iron Binding Capacity ([facility] only)    Standing Status:   Future    Expiration Date:   12/17/2024   I spent a total of 40 minutes including review of chart and various tests results, face-to-face time with the patient, discussions about results, discussed risk, benefit of anticoagulation, management of thromboembolism, prevention, precautions with anticoagulation details as above.  Pauletta BROCKS [name], MD  INTERVAL HISTORY: Patient returns for follow-up.  Overall feeling well.  She has several ED visits of shortness of breath.  She did not have difficulty taking deep breath with chest pain.  CTA was negative for PE.  Menstrual cycle is not always heavy but used to be.  She is very scared of VTE recurrence.  She was off Eliquis  for  2 weeks and was taking baby aspirin .  She denies any other new symptoms.  She has resumed Eliquis  after picking up her new medication.  1 family died of PE makes her very scary or pulmonary embolism.  She is also taking medication for weight loss.  VTE History:   2011 [place]  LLE: No evidence of deep venous thrombosis in the left lower extremity.    06/30/18 d-dimer 2.26 due to COVID hospitals had difficulty getting her in quickly. She started  Xarelto . She started for a few days. Report pain in the chest, pressure and couldn't take a deep breath.   07/01/18 CTA: No evidence of pulmonary emboli or other acute abnormality in the chest.   She reported being on it for about 6 months. Report of heavier cycles.   Report in April of 2020 she had a PE and got a CT scan was negative for PE. Her symptoms of SOB came on quickly and she had palpitations as well. Pt reports she was told that the blood clot could have been missed due to being on Xarelto . At the time her D-dimer was high and she was having SOB so her PCP placed her on Xarelto  for 6 months    08/19/19 saw Dr. Onesimo at [facility].    10/2020 CTA: No evidence of pulmonary embolism or acute cardiopulmonary disease. 2. Evidence of prior gastric surgery.   10/04/21 presented with intermittent chest pain for the last 2 weeks. CTA: No CT findings for pulmonary embolism.   11/30/21 presented with shortness of breath, chest tightness, discomfort in her left lateral chest and back, CTA: No PE   07/2023 CTA: No evidence of pulmonary embolism or other acute intrathoracic process.   04/29/23 report new short of breath first thought was heat. She then started feeling hart to breath and pain on deep breaths from center to back. CTA: Small amount of pulmonary embolism involving multiple posterior lower lobe branches of the right pulmonary artery.   She was started on Xarelto .   No long drive, flight, trauma, surgery, injury, HRT or estrogen, smoking cigarette.    05/05/23 presented to the ED with complaint of chest pain. CTA: Persistent but improved subsegmental pulmonary emboli in the right lower lobe. 2. No new or progressive pulmonary embolus.  3. Clear lungs.   05/28/23 presented to ED with mid and LT chest pain and upper back pain that has worsened over the last week  CTA: Previous right lower lobe pulmonary emboli have resolved. No new, persistent or recurrent pulmonary embolus.   She had gastric  sleeve in 2021.    Father had long drive to [facility] and had DVT. He has another one but not clear the setting.   First cousin on paternal side died of PE.   No bloody or dark stool. No bloody urine or vaginal bleeding. She has regular periods.   PHYSICAL EXAMINATION:  Vitals:   12/18/23 0832  BP: 126/63  Pulse: 70  Resp: 14  Temp: 97.9 F (36.6 C)   Filed Weights   12/18/23 0832  Weight: 273 lb 8 oz (124.1 kg)    GENERAL: alert, no distress and comfortable SKIN: skin color normal  LUNGS: No chest pain on deep breath, normal breathing effort Musculoskeletal: no edema   Relevant data reviewed during this visit included labs.  New labs ordered.

## 2023-12-17 NOTE — Telephone Encounter (Signed)
 Scheduled patient for an office visit tomorrow and labs. Called and spoke with the patient, she is aware.

## 2023-12-18 ENCOUNTER — Inpatient Hospital Stay

## 2023-12-18 ENCOUNTER — Ambulatory Visit: Payer: Self-pay

## 2023-12-18 VITALS — BP 126/63 | HR 70 | Temp 97.9°F | Resp 14 | Wt 273.5 lb

## 2023-12-18 DIAGNOSIS — Z86711 Personal history of pulmonary embolism: Secondary | ICD-10-CM | POA: Diagnosis present

## 2023-12-18 DIAGNOSIS — I2699 Other pulmonary embolism without acute cor pulmonale: Secondary | ICD-10-CM | POA: Diagnosis not present

## 2023-12-18 DIAGNOSIS — Z903 Acquired absence of stomach [part of]: Secondary | ICD-10-CM

## 2023-12-18 DIAGNOSIS — Z9884 Bariatric surgery status: Secondary | ICD-10-CM | POA: Diagnosis not present

## 2023-12-18 DIAGNOSIS — Z832 Family history of diseases of the blood and blood-forming organs and certain disorders involving the immune mechanism: Secondary | ICD-10-CM | POA: Insufficient documentation

## 2023-12-18 LAB — CBC WITH DIFFERENTIAL (CANCER CENTER ONLY)
Abs Immature Granulocytes: 0.01 K/uL (ref 0.00–0.07)
Basophils Absolute: 0.1 K/uL (ref 0.0–0.1)
Basophils Relative: 1 %
Eosinophils Absolute: 0.2 K/uL (ref 0.0–0.5)
Eosinophils Relative: 3 %
HCT: 38.2 % (ref 36.0–46.0)
Hemoglobin: 12.9 g/dL (ref 12.0–15.0)
Immature Granulocytes: 0 %
Lymphocytes Relative: 24 %
Lymphs Abs: 1.5 K/uL (ref 0.7–4.0)
MCH: 31.5 pg (ref 26.0–34.0)
MCHC: 33.8 g/dL (ref 30.0–36.0)
MCV: 93.2 fL (ref 80.0–100.0)
Monocytes Absolute: 0.4 K/uL (ref 0.1–1.0)
Monocytes Relative: 6 %
Neutro Abs: 4.4 K/uL (ref 1.7–7.7)
Neutrophils Relative %: 66 %
Platelet Count: 319 K/uL (ref 150–400)
RBC: 4.1 MIL/uL (ref 3.87–5.11)
RDW: 12 % (ref 11.5–15.5)
WBC Count: 6.5 K/uL (ref 4.0–10.5)
nRBC: 0 % (ref 0.0–0.2)

## 2023-12-18 LAB — IRON AND IRON BINDING CAPACITY (CC-WL,HP ONLY)
Iron: 92 ug/dL (ref 28–170)
Saturation Ratios: 30 % (ref 10.4–31.8)
TIBC: 305 ug/dL (ref 250–450)
UIBC: 213 ug/dL (ref 148–442)

## 2023-12-18 LAB — FERRITIN: Ferritin: 86 ng/mL (ref 11–307)

## 2023-12-18 LAB — VITAMIN B12: Vitamin B-12: 564 pg/mL (ref 180–914)

## 2023-12-18 MED ORDER — APIXABAN 2.5 MG PO TABS: 2.5000 mg | ORAL_TABLET | Freq: Two times a day (BID) | ORAL | 1 refills | Status: AC

## 2023-12-18 NOTE — Telephone Encounter (Signed)
 Error

## 2023-12-18 NOTE — Telephone Encounter (Signed)
-----   Message from Pauletta JAYSON Chihuahua sent at 12/18/2023  1:15 PM EDT ----- Zorita please let her know no concerning findings from lab. Thanks  ----- Message ----- From: Rebecka, Lab In [place] Sent: 12/18/2023   9:20 AM EDT To: Pauletta JAYSON Chihuahua, MD

## 2023-12-18 NOTE — Assessment & Plan Note (Addendum)
 Repeat b12 and iron studies and ferritin today Follow-up in 5 to 6 months

## 2023-12-18 NOTE — Assessment & Plan Note (Addendum)
 Will transition to apixaban  2.5 mg twice daily Prescription sent today Increase fluid intake. Frequent ambulation, avoid sedentary lifestyle Continue weight loss management as able

## 2023-12-19 NOTE — Telephone Encounter (Signed)
-----   Message from Pauletta JAYSON Chihuahua sent at 12/18/2023  1:15 PM EDT ----- Zorita please let her know no concerning findings from lab. Thanks  ----- Message ----- From: Rebecka, Lab In [place] Sent: 12/18/2023   9:20 AM EDT To: Pauletta JAYSON Chihuahua, MD

## 2023-12-19 NOTE — Telephone Encounter (Signed)
 Notified of message below

## 2024-03-24 ENCOUNTER — Other Ambulatory Visit (HOSPITAL_COMMUNITY)
Admission: RE | Admit: 2024-03-24 | Discharge: 2024-03-24 | Disposition: A | Source: Ambulatory Visit | Attending: Obstetrics and Gynecology | Admitting: Obstetrics and Gynecology

## 2024-03-24 ENCOUNTER — Ambulatory Visit: Admitting: *Deleted

## 2024-03-24 DIAGNOSIS — Z113 Encounter for screening for infections with a predominantly sexual mode of transmission: Secondary | ICD-10-CM | POA: Insufficient documentation

## 2024-03-24 DIAGNOSIS — N898 Other specified noninflammatory disorders of vagina: Secondary | ICD-10-CM | POA: Diagnosis present

## 2024-03-24 NOTE — Progress Notes (Signed)
 SUBJECTIVE:  49 y.o. female complains of thin vaginal discharge for 5 day(s) and  Denies abnormal vaginal bleeding or significant pelvic pain or fever.Denies history of known exposure to STD.  No LMP recorded.  OBJECTIVE:  She appears alert, well appearing, in no apparent distress   ASSESSMENT:  Vaginal Discharge/Irritation Std screen     PLAN:  GC, chlamydia, trichomonas, BVAG, CVAG probe sent to lab. Treatment: To be determined once lab results are received ROV prn if symptoms persist or worsen.

## 2024-03-25 ENCOUNTER — Telehealth: Payer: Self-pay | Admitting: *Deleted

## 2024-03-25 LAB — RPR+HBSAG+HCVAB+...
HIV Screen 4th Generation wRfx: NONREACTIVE
Hep C Virus Ab: NONREACTIVE
Hepatitis B Surface Ag: NEGATIVE
RPR Ser Ql: NONREACTIVE

## 2024-03-25 NOTE — Telephone Encounter (Signed)
 Pt called to office asking about labs.  Pt made aware of lab results, swab not yet back.  Pt advised to look for results in MyChart and contact office as needed.

## 2024-03-27 ENCOUNTER — Encounter: Payer: Self-pay | Admitting: Family Medicine

## 2024-03-30 ENCOUNTER — Encounter: Payer: Self-pay | Admitting: *Deleted

## 2024-03-31 ENCOUNTER — Other Ambulatory Visit: Payer: Self-pay | Admitting: *Deleted

## 2024-03-31 DIAGNOSIS — B9689 Other specified bacterial agents as the cause of diseases classified elsewhere: Secondary | ICD-10-CM

## 2024-03-31 LAB — CERVICOVAGINAL ANCILLARY ONLY
Bacterial Vaginitis (gardnerella): POSITIVE — AB
Candida Glabrata: NEGATIVE
Candida Vaginitis: NEGATIVE
Chlamydia: NEGATIVE
Comment: NEGATIVE
Comment: NEGATIVE
Comment: NEGATIVE
Comment: NEGATIVE
Comment: NEGATIVE
Comment: NORMAL
Neisseria Gonorrhea: NEGATIVE
Trichomonas: NEGATIVE

## 2024-03-31 MED ORDER — METRONIDAZOLE 500 MG PO TABS: 500.0000 mg | ORAL_TABLET | Freq: Two times a day (BID) | ORAL | 0 refills | Status: AC

## 2024-03-31 NOTE — Progress Notes (Signed)
 Flagyl  sent for +BV. Pt  made aware of results and Rx info.

## 2024-04-08 ENCOUNTER — Emergency Department (HOSPITAL_BASED_OUTPATIENT_CLINIC_OR_DEPARTMENT_OTHER)
Admission: EM | Admit: 2024-04-08 | Discharge: 2024-04-08 | Disposition: A | Discharge status: Home / Self Care | Attending: Emergency Medicine | Admitting: Emergency Medicine

## 2024-04-08 ENCOUNTER — Encounter (HOSPITAL_BASED_OUTPATIENT_CLINIC_OR_DEPARTMENT_OTHER): Payer: Self-pay

## 2024-04-08 ENCOUNTER — Other Ambulatory Visit: Payer: Self-pay

## 2024-04-08 ENCOUNTER — Emergency Department (HOSPITAL_BASED_OUTPATIENT_CLINIC_OR_DEPARTMENT_OTHER)

## 2024-04-08 DIAGNOSIS — R10A3 Flank pain, bilateral: Secondary | ICD-10-CM | POA: Insufficient documentation

## 2024-04-08 DIAGNOSIS — I2699 Other pulmonary embolism without acute cor pulmonale: Secondary | ICD-10-CM | POA: Diagnosis not present

## 2024-04-08 DIAGNOSIS — R300 Dysuria: Secondary | ICD-10-CM | POA: Insufficient documentation

## 2024-04-08 DIAGNOSIS — R8289 Other abnormal findings on cytological and histological examination of urine: Secondary | ICD-10-CM | POA: Diagnosis not present

## 2024-04-08 DIAGNOSIS — Z7901 Long term (current) use of anticoagulants: Secondary | ICD-10-CM | POA: Diagnosis not present

## 2024-04-08 DIAGNOSIS — R0602 Shortness of breath: Secondary | ICD-10-CM | POA: Diagnosis present

## 2024-04-08 DIAGNOSIS — Z86711 Personal history of pulmonary embolism: Secondary | ICD-10-CM

## 2024-04-08 LAB — URINALYSIS, W/ REFLEX TO CULTURE (INFECTION SUSPECTED)
Bilirubin Urine: NEGATIVE
Glucose, UA: NEGATIVE mg/dL
Hgb urine dipstick: NEGATIVE
Ketones, ur: NEGATIVE mg/dL
Nitrite: NEGATIVE
Protein, ur: NEGATIVE mg/dL
Specific Gravity, Urine: 1.01 (ref 1.005–1.030)
pH: 6 (ref 5.0–8.0)

## 2024-04-08 LAB — WET PREP, GENITAL
Clue Cells Wet Prep HPF POC: NONE SEEN
Sperm: NONE SEEN
Trich, Wet Prep: NONE SEEN
WBC, Wet Prep HPF POC: >=10 — AB
Yeast Wet Prep HPF POC: NONE SEEN

## 2024-04-08 LAB — COMPREHENSIVE METABOLIC PANEL WITH GFR
ALT: 13 U/L (ref 0–44)
AST: 24 U/L (ref 15–41)
Albumin: 4.2 g/dL (ref 3.5–5.0)
Alkaline Phosphatase: 43 U/L (ref 38–126)
Anion gap: 10 (ref 5–15)
BUN: 9 mg/dL (ref 6–20)
CO2: 24 mmol/L (ref 22–32)
Calcium: 9.4 mg/dL (ref 8.9–10.3)
Chloride: 105 mmol/L (ref 98–111)
Creatinine, Ser: 0.67 mg/dL (ref 0.44–1.00)
GFR, Estimated: >60 mL/min
Glucose, Bld: 100 mg/dL — ABNORMAL HIGH (ref 70–99)
Potassium: 4.8 mmol/L (ref 3.5–5.1)
Sodium: 139 mmol/L (ref 135–145)
Total Bilirubin: 1.1 mg/dL (ref 0.0–1.2)
Total Protein: 7 g/dL (ref 6.5–8.1)

## 2024-04-08 LAB — CBC WITH DIFFERENTIAL/PLATELET
Abs Immature Granulocytes: 0.01 K/uL (ref 0.00–0.07)
Basophils Absolute: 0 K/uL (ref 0.0–0.1)
Basophils Relative: 0 %
Eosinophils Absolute: 0.1 K/uL (ref 0.0–0.5)
Eosinophils Relative: 2 %
HCT: 40.1 % (ref 36.0–46.0)
Hemoglobin: 13.4 g/dL (ref 12.0–15.0)
Immature Granulocytes: 0 %
Lymphocytes Relative: 33 %
Lymphs Abs: 2.4 K/uL (ref 0.7–4.0)
MCH: 31.7 pg (ref 26.0–34.0)
MCHC: 33.4 g/dL (ref 30.0–36.0)
MCV: 94.8 fL (ref 80.0–100.0)
Monocytes Absolute: 0.5 K/uL (ref 0.1–1.0)
Monocytes Relative: 7 %
Neutro Abs: 4.2 K/uL (ref 1.7–7.7)
Neutrophils Relative %: 58 %
Platelets: 340 K/uL (ref 150–400)
RBC: 4.23 MIL/uL (ref 3.87–5.11)
RDW: 12 % (ref 11.5–15.5)
WBC: 7.2 K/uL (ref 4.0–10.5)
nRBC: 0 % (ref 0.0–0.2)

## 2024-04-08 LAB — TROPONIN T, HIGH SENSITIVITY: Troponin T High Sensitivity: <6 ng/L (ref 0–19)

## 2024-04-08 LAB — LIPASE, BLOOD: Lipase: 26 U/L (ref 11–51)

## 2024-04-08 LAB — HCG, SERUM, QUALITATIVE: Preg, Serum: NEGATIVE

## 2024-04-08 MED ORDER — CEPHALEXIN 500 MG PO CAPS: 500.0000 mg | ORAL_CAPSULE | Freq: Four times a day (QID) | ORAL | 0 refills | Status: AC

## 2024-04-08 MED ORDER — CEPHALEXIN 250 MG PO CAPS
500.0000 mg | ORAL_CAPSULE | Freq: Once | ORAL | Status: AC
  Administered 2024-04-08: 500 mg via ORAL
  Filled 2024-04-08: qty 2

## 2024-04-08 MED ORDER — IOHEXOL 350 MG/ML SOLN
100.0000 mL | Freq: Once | INTRAVENOUS | Status: AC | PRN
  Administered 2024-04-08: 100 mL via INTRAVENOUS

## 2024-04-08 NOTE — ED Triage Notes (Signed)
 Pt arrives with c/o SOB that started 2 days ago. Pt reports lower back and pelvic pain that started a few days ago. Pt has hx of PE. Pt denies urinary symptoms. Pt currently in antibiotics fo BV.

## 2024-04-08 NOTE — ED Provider Notes (Addendum)
 " [facility] Provider Note   CSN: [id number] Arrival date & time: 04/08/24  2049    Patient presents with: Shortness of Breath   Bridget [name] is a 49 y.o. female here for evaluation shortness of breath and flank pain.  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  Has not missed any doses.  States she was initially seen for some lower abdominal irritation by OB/GYN and had some swabs performed however there was a delay in her diagnosis of her bacterial vaginosis for over a week here today.  She states she has had persistent pain and pressure in her bilateral posterior flanks.  No hematuria. Some dysuria. No pain in her chest however states sometimes it is difficult to take a deep breath.  No recent falls or injuries.  No pain or swelling to legs.  No vaginal discharge. No cough, congestion, rhinorrhea.  No LOC.  No change in bowel movements.  She is some tightness in her lower chest and upper abdomen.  No vomiting.  No chronic NSAID use, EtOH use   HPI     Prior to Admission medications  Medication Sig Start Date End Date Taking? Authorizing Provider  cephALEXin  (KEFLEX ) 500 MG capsule Take 1 capsule (500 mg total) by mouth 4 (four) times daily. 04/08/24  Yes Jermika Olden A, PA-C  acetaminophen  (TYLENOL ) 500 MG tablet Take 1,000 mg by mouth every 6 (six) hours as needed for mild pain.    [provider]  albuterol  (VENTOLIN  HFA) 108 (90 Base) MCG/ACT inhaler Inhale 1-2 puffs into the lungs every 6 (six) hours as needed for wheezing or shortness of breath. 06/21/20   Jason Leita Repine, FNP  apixaban  (ELIQUIS ) 2.5 MG TABS tablet Take 1 tablet (2.5 mg total) by mouth 2 (two) times daily. 12/18/23   [name] Pauletta BROCKS, MD  cetirizine (ZYRTEC) 10 MG tablet Take 10 mg by mouth daily as needed for allergies.    [provider]  Cholecalciferol  (VITAMIN D3) 125 MCG (5000 UT) CAPS Take 1 capsule by mouth  daily.    [provider]  Cyanocobalamin  (VITAMIN B-12) 1000 MCG SUBL Place 1 tablet (1,000 mcg total) under the tongue daily. 08/04/19   Plotnikov, Karlynn GAILS, MD  lidocaine  (LIDODERM ) 5 % Place 1 patch onto the skin daily as needed. Remove & Discard patch within 12 hours or as directed by MD 05/05/23   Elnor Jayson LABOR, DO  meclizine  (ANTIVERT ) 25 MG tablet Take 1 tablet (25 mg total) by mouth 3 (three) times daily as needed for dizziness. 10/09/23   Plotnikov, Aleksei V, MD  methocarbamol  (ROBAXIN ) 500 MG tablet Take 1 tablet (500 mg total) by mouth 2 (two) times daily as needed. 11/14/23   Bernis Ernst, PA-C  metroNIDAZOLE  (FLAGYL ) 500 MG tablet Take 1 tablet (500 mg total) by mouth 2 (two) times daily. 03/31/24   Izell Harari, MD  Multiple Vitamin (MULTIVITAMIN) capsule Take 1 capsule by mouth daily.    [provider]  pantoprazole  (PROTONIX ) 40 MG tablet Take 1 tablet (40 mg total) by mouth daily. 11/06/20   Plotnikov, Aleksei V, MD  Potassium Chloride  ER 20 MEQ TBCR Take 1 tablet by mouth 2 (two) times daily. 09/27/23   [provider]  potassium chloride  SA (KLOR-CON  M) 20 MEQ tablet Take 2 tablets (40 mEq total) by mouth 2 (two) times daily. 10/09/23   Plotnikov, Aleksei V, MD  rizatriptan  (MAXALT ) 10 MG tablet Take  1 tablet (10 mg total) by mouth daily as needed for migraine. May repeat in 2 hours if needed 02/19/23   Plotnikov, Aleksei V, MD  tirzepatide  (MOUNJARO ) 10 MG/0.5ML Pen Inject 10 mg into the skin once a week. On a COMPOUND Rx 07/21/23   Plotnikov, Karlynn GAILS, MD  Tirzepatide -Weight Management (ZEPBOUND  [place])     [provider]  triamterene -hydrochlorothiazide (MAXZIDE-25) 37.5-25 MG tablet Take 1 tablet by mouth daily. 10/21/23   Plotnikov, Karlynn GAILS, MD    Allergies: Meloxicam     Review of Systems  Constitutional: Negative.   HENT: Negative.    Respiratory:  Positive for chest tightness and shortness of breath. Negative for apnea, cough, choking,  wheezing and stridor.   Cardiovascular: Negative.   Gastrointestinal: Negative.   Genitourinary:  Positive for flank pain and frequency.  Skin: Negative.   Neurological: Negative.   All other systems reviewed and are negative.   Updated Vital Signs BP 131/85   Pulse 92   Temp 98.6 F (37 C) (Oral)   Resp 18   Ht 5' 4 (1.626 m)   Wt 123.8 kg   SpO2 100%   BMI 46.86 kg/m   Physical Exam Vitals and nursing note reviewed.  Constitutional:      General: She is not in acute distress.    Appearance: She is well-developed. She is not ill-appearing, toxic-appearing or diaphoretic.  HENT:     Head: Normocephalic and atraumatic.  Eyes:     Pupils: Pupils are equal, round, and reactive to light.  Cardiovascular:     Rate and Rhythm: Normal rate.     Pulses: Normal pulses.          Radial pulses are 2+ on the right side and 2+ on the left side.       Dorsalis pedis pulses are 2+ on the right side and 2+ on the left side.     Heart sounds: Normal heart sounds.  Pulmonary:     Effort: Pulmonary effort is normal. No respiratory distress.     Breath sounds: Normal breath sounds.     Comments: Clear bil, speaks in full sentences wo difficulty Abdominal:     General: Bowel sounds are normal. There is no distension.     Palpations: Abdomen is soft.     Tenderness: There is abdominal tenderness. There is right CVA tenderness and left CVA tenderness. There is no guarding or rebound. Negative signs include Murphy's sign and McBurney's sign.  Musculoskeletal:        General: Normal range of motion.     Cervical back: Normal range of motion.     Right lower leg: No tenderness. No edema.     Left lower leg: No tenderness. No edema.  Skin:    General: Skin is warm and dry.     Capillary Refill: Capillary refill takes less than 2 seconds.     Comments: No obvious rashes or lesions on exposed skin  Neurological:     General: No focal deficit present.     Mental Status: She is alert.   Psychiatric:        Mood and Affect: Mood normal.     (all labs ordered are listed, but only abnormal results are displayed) Labs Reviewed  WET PREP, GENITAL - Abnormal; Notable for the following components:      Result Value   WBC, Wet Prep HPF POC >=10 (*)    All other components within normal limits  COMPREHENSIVE METABOLIC PANEL WITH  GFR - Abnormal; Notable for the following components:   Glucose, Bld 100 (*)    All other components within normal limits  URINALYSIS, W/ REFLEX TO CULTURE (INFECTION SUSPECTED) - Abnormal; Notable for the following components:   APPearance CLOUDY (*)    Leukocytes,Ua TRACE (*)    Bacteria, UA FEW (*)    All other components within normal limits  URINE CULTURE  CBC WITH DIFFERENTIAL/PLATELET  LIPASE, BLOOD  HCG, SERUM, QUALITATIVE  GC/CHLAMYDIA PROBE AMP ([place]) NOT AT [facility]  TROPONIN T, HIGH SENSITIVITY    EKG: EKG Interpretation Date/Time:  Thursday April 08 2024 21:23:04 EST Ventricular Rate:  83 PR Interval:  171 QRS Duration:  83 QT Interval:  344 QTC Calculation: 405 R Axis:   47  Text Interpretation: Sinus rhythm Probable left atrial enlargement Abnormal R-wave progression, early transition No significant change since last tracing Confirmed by Doretha Folks ([id number]) on 04/08/2024 10:07:57 PM  Radiology: CT Angio Chest PE W and/or Wo Contrast Result Date: 04/08/2024 EXAM: CTA CHEST PE WITHOUT AND WITH CONTRAST CT ABDOMEN AND PELVIS WITHOUT AND WITH CONTRAST 04/08/2024 10:58:01 PM TECHNIQUE: CTA of the chest was performed after the administration of 100 mL iohexol  (OMNIPAQUE ) 350 MG/ML injection. Multiplanar reformatted images are provided for review. MIP images are provided for review. CT of the abdomen and pelvis was performed without and with the administration of 100 mL iohexol  (OMNIPAQUE ) 350 MG/ML injection. Automated exposure control, iterative reconstruction, and/or weight based adjustment of the mA/kV was utilized to  reduce the radiation dose to as low as reasonably achievable. COMPARISON: CT angiogram chest CT abdomen and pelvis 11/13/2023. CLINICAL HISTORY: Pulmonary embolism (PE) suspected, high prob; cp, flank pain, hx of recurrent pe. Pulmonary embolism suspected, high probability; chest pain, flank pain, history of recurrent pulmonary embolism. FINDINGS: CHEST: PULMONARY ARTERIES: Pulmonary arteries are adequately opacified for evaluation. No intraluminal filling defect to suggest pulmonary embolism. Main pulmonary artery is normal in caliber. MEDIASTINUM: No mediastinal lymphadenopathy. The heart and pericardium demonstrate no acute abnormality. There is no acute abnormality of the thoracic aorta. LUNGS AND PLEURA: The lungs are without acute process. No focal consolidation or pulmonary edema. No pleural effusion or pneumothorax. SOFT TISSUES AND BONES: There is scoliosis and degenerative change throughout the spine. No acute soft tissue abnormality. ABDOMEN AND PELVIS: LIVER: The liver is unremarkable. GALLBLADDER AND BILE DUCTS: Gallbladder is unremarkable. No biliary ductal dilatation. SPLEEN: Spleen demonstrates no acute abnormality. PANCREAS: Pancreas demonstrates no acute abnormality. ADRENAL GLANDS: Adrenal glands demonstrate no acute abnormality. KIDNEYS, URETERS AND BLADDER: There is a 17 mm cyst in the right kidney. No stones in the kidneys or ureters. No hydronephrosis. No perinephric or periureteral stranding. Urinary bladder is unremarkable. GI AND BOWEL: There are postsurgical changes in the stomach. There is diffuse colonic diverticulosis, severe in the sigmoid colon. Appendix appears within normal limits. There is no bowel obstruction. No abnormal bowel wall thickening or distension. REPRODUCTIVE: Exophytic left uterine fibroid measures 16 mm. PERITONEUM AND RETROPERITONEUM: No ascites or free air. LYMPH NODES: No lymphadenopathy. BONES AND SOFT TISSUES: No acute findings. There is a small fat containing  umbilical hernia. IMPRESSION: 1. No evidence of pulmonary embolism. 2. No acute abnormality within the abdomen or pelvis. 3. Diffuse colonic diverticulosis, severe in the sigmoid colon, without acute diverticulitis. 4. Incidental 1.7 cm right renal cyst and 1.6 cm exophytic uterine fibroid. Electronically signed by: Greig Pique MD 04/08/2024 11:07 PM EST RP Workstation: [id number]   CT ABDOMEN PELVIS W CONTRAST Result Date:  04/08/2024 EXAM: CTA CHEST PE WITHOUT AND WITH CONTRAST CT ABDOMEN AND PELVIS WITHOUT AND WITH CONTRAST 04/08/2024 10:58:01 PM TECHNIQUE: CTA of the chest was performed after the administration of 100 mL iohexol  (OMNIPAQUE ) 350 MG/ML injection. Multiplanar reformatted images are provided for review. MIP images are provided for review. CT of the abdomen and pelvis was performed without and with the administration of 100 mL iohexol  (OMNIPAQUE ) 350 MG/ML injection. Automated exposure control, iterative reconstruction, and/or weight based adjustment of the mA/kV was utilized to reduce the radiation dose to as low as reasonably achievable. COMPARISON: CT angiogram chest CT abdomen and pelvis 11/13/2023. CLINICAL HISTORY: Pulmonary embolism (PE) suspected, high prob; cp, flank pain, hx of recurrent pe. Pulmonary embolism suspected, high probability; chest pain, flank pain, history of recurrent pulmonary embolism. FINDINGS: CHEST: PULMONARY ARTERIES: Pulmonary arteries are adequately opacified for evaluation. No intraluminal filling defect to suggest pulmonary embolism. Main pulmonary artery is normal in caliber. MEDIASTINUM: No mediastinal lymphadenopathy. The heart and pericardium demonstrate no acute abnormality. There is no acute abnormality of the thoracic aorta. LUNGS AND PLEURA: The lungs are without acute process. No focal consolidation or pulmonary edema. No pleural effusion or pneumothorax. SOFT TISSUES AND BONES: There is scoliosis and degenerative change throughout the spine. No acute  soft tissue abnormality. ABDOMEN AND PELVIS: LIVER: The liver is unremarkable. GALLBLADDER AND BILE DUCTS: Gallbladder is unremarkable. No biliary ductal dilatation. SPLEEN: Spleen demonstrates no acute abnormality. PANCREAS: Pancreas demonstrates no acute abnormality. ADRENAL GLANDS: Adrenal glands demonstrate no acute abnormality. KIDNEYS, URETERS AND BLADDER: There is a 17 mm cyst in the right kidney. No stones in the kidneys or ureters. No hydronephrosis. No perinephric or periureteral stranding. Urinary bladder is unremarkable. GI AND BOWEL: There are postsurgical changes in the stomach. There is diffuse colonic diverticulosis, severe in the sigmoid colon. Appendix appears within normal limits. There is no bowel obstruction. No abnormal bowel wall thickening or distension. REPRODUCTIVE: Exophytic left uterine fibroid measures 16 mm. PERITONEUM AND RETROPERITONEUM: No ascites or free air. LYMPH NODES: No lymphadenopathy. BONES AND SOFT TISSUES: No acute findings. There is a small fat containing umbilical hernia. IMPRESSION: 1. No evidence of pulmonary embolism. 2. No acute abnormality within the abdomen or pelvis. 3. Diffuse colonic diverticulosis, severe in the sigmoid colon, without acute diverticulitis. 4. Incidental 1.7 cm right renal cyst and 1.6 cm exophytic uterine fibroid. Electronically signed by: Greig Pique MD 04/08/2024 11:07 PM EST RP Workstation: [id number]     Procedures   49 year old multiple medical comorbidities here for evaluation of flank pain, shortness of breath and suprapubic fullness.  Seen by OB/GYN had some swabs done on antibiotics for BV.  Continuing to have some symptoms.  She has a history of recurrent PE.  She is currently anticoagulated.  She is some tenderness to her upper flanks bilaterally.  No obvious hematuria. Some dysuria Shared decision making for imaging.  Will plan on CTA chest, CT abdomen pelvis given location of pain, shortness of breath, recurrent PE will plan  on labs, GYN, reassess  Labs and imaging personally viewed and interpreted:  Pregnancy test neg Lipase 26 Metabolic panel without significant abnormality CBC wo leukocytosis UA trace leuks, few bacteria 6-10 WBC--sent for culture Trop <6 EKG wo ischemic changes CT AP diverticulosis without diverticulitis, renal cyst, uterine fibroid CTA Chest no PE, infection, edema  Patient reassessed.   Wet prep greater than 10 WBC  Imaging today reassuring.  Incidental fibroid uterus, renal cyst which patient states she was already aware of.  Shared decision making with patient given dysuria, flank pain, equivocal urine.  Will send for culture however will treat for infection in the meantime given her symptoms.  I suspect her shortness of breath is likely due to her pain from her flank.  Will have her follow-up outpatient, return for new or worsening symptoms  Considered ACS, PE, dissection, pneumonia, pneumothorax, infected kidney stone, pyelonephritis, obstruction, perforation, PID, TOA.  The patient has been appropriately medically screened and/or stabilized in the ED. I have low suspicion for any other emergent medical condition which would require further screening, evaluation or treatment in the ED or require inpatient management.  Patient is hemodynamically stable and in no acute distress.  Patient able to ambulate in department prior to ED.  Evaluation does not show acute pathology that would require ongoing or additional emergent interventions while in the emergency department or further inpatient treatment.  I have discussed the diagnosis with the patient and answered all questions.  Pain is been managed while in the emergency department and patient has no further complaints prior to discharge.  Patient is comfortable with plan discussed in room and is stable for discharge at this time.  I have discussed strict return precautions for returning to the emergency department.  Patient was encouraged  to follow-up with PCP/specialist refer to at discharge.   Medications Ordered in the ED  cephALEXin  (KEFLEX ) capsule 500 mg (has no administration in time range)  iohexol  (OMNIPAQUE ) 350 MG/ML injection 100 mL (100 mLs Intravenous Contrast Given 04/08/24 2221)                                    Medical Decision Making Amount and/or Complexity of Data Reviewed External Data Reviewed: labs, radiology, ECG and notes. Labs: ordered. Decision-making details documented in ED Course. Radiology: ordered and independent interpretation performed. Decision-making details documented in ED Course. ECG/medicine tests: ordered and independent interpretation performed. Decision-making details documented in ED Course.  Risk OTC drugs. Prescription drug management. Decision regarding hospitalization. Diagnosis or treatment significantly limited by social determinants of health.       Final diagnoses:  Chronic anticoagulation  History of pulmonary embolism  Bilateral flank pain  SOB (shortness of breath)  Dysuria    ED Discharge Orders          Ordered    cephALEXin  (KEFLEX ) 500 MG capsule  4 times daily        04/08/24 2324               Carson Meche A, PA-C 04/08/24 2326    Taz Vanness A, PA-C 04/08/24 2327    Doretha Folks, MD 04/10/24 1312  "

## 2024-04-08 NOTE — Discharge Instructions (Addendum)
 It was a pleasure taking care of you here today  As we discussed your workup is reassuring however your urine was equivocal.  We sent for culture.  In the meantime we are treating you with antibiotics.  Make sure to follow-up outpatient, return for any worsening symptoms

## 2024-04-10 LAB — URINE CULTURE: Culture: NO GROWTH

## 2024-04-10 LAB — GC/CHLAMYDIA PROBE AMP (~~LOC~~) NOT AT ARMC
Chlamydia: NEGATIVE
Comment: NEGATIVE
Comment: NORMAL
Neisseria Gonorrhea: NEGATIVE

## 2024-04-30 ENCOUNTER — Ambulatory Visit: Admitting: Family Medicine
